# Patient Record
Sex: Female | Born: 1955 | Race: White | Hispanic: No | Smoking: Current every day smoker
Health system: Southern US, Community
[De-identification: ages and names within clinical notes are randomized; demographics above are authoritative.]

## PROBLEM LIST (undated history)

## (undated) ENCOUNTER — Emergency Department (HOSPITAL_COMMUNITY): Discharge status: Absent without leave | Payer: Self-pay

## (undated) DIAGNOSIS — C539 Malignant neoplasm of cervix uteri, unspecified: Secondary | ICD-10-CM

## (undated) DIAGNOSIS — I480 Paroxysmal atrial fibrillation: Secondary | ICD-10-CM

## (undated) DIAGNOSIS — Z9981 Dependence on supplemental oxygen: Secondary | ICD-10-CM

## (undated) DIAGNOSIS — C44320 Squamous cell carcinoma of skin of unspecified parts of face: Secondary | ICD-10-CM

## (undated) DIAGNOSIS — F419 Anxiety disorder, unspecified: Secondary | ICD-10-CM

## (undated) DIAGNOSIS — E119 Type 2 diabetes mellitus without complications: Secondary | ICD-10-CM

## (undated) DIAGNOSIS — F32A Depression, unspecified: Secondary | ICD-10-CM

## (undated) DIAGNOSIS — E876 Hypokalemia: Secondary | ICD-10-CM

## (undated) DIAGNOSIS — I10 Essential (primary) hypertension: Secondary | ICD-10-CM

## (undated) DIAGNOSIS — J449 Chronic obstructive pulmonary disease, unspecified: Secondary | ICD-10-CM

## (undated) DIAGNOSIS — J42 Unspecified chronic bronchitis: Secondary | ICD-10-CM

## (undated) DIAGNOSIS — N824 Other female intestinal-genital tract fistulae: Secondary | ICD-10-CM

## (undated) DIAGNOSIS — F909 Attention-deficit hyperactivity disorder, unspecified type: Secondary | ICD-10-CM

## (undated) DIAGNOSIS — J45909 Unspecified asthma, uncomplicated: Secondary | ICD-10-CM

## (undated) DIAGNOSIS — M1990 Unspecified osteoarthritis, unspecified site: Secondary | ICD-10-CM

## (undated) DIAGNOSIS — C44310 Basal cell carcinoma of skin of unspecified parts of face: Secondary | ICD-10-CM

## (undated) DIAGNOSIS — K219 Gastro-esophageal reflux disease without esophagitis: Secondary | ICD-10-CM

## (undated) DIAGNOSIS — F329 Major depressive disorder, single episode, unspecified: Secondary | ICD-10-CM

## (undated) DIAGNOSIS — E041 Nontoxic single thyroid nodule: Secondary | ICD-10-CM

## (undated) HISTORY — DX: Attention-deficit hyperactivity disorder, unspecified type: F90.9

## (undated) HISTORY — PX: COLECTOMY: SHX59

## (undated) HISTORY — PX: SQUAMOUS CELL CARCINOMA EXCISION: SHX2433

## (undated) HISTORY — DX: Other female intestinal-genital tract fistulae: N82.4

## (undated) HISTORY — PX: EXCISIONAL HEMORRHOIDECTOMY: SHX1541

## (undated) HISTORY — DX: Hypokalemia: E87.6

## (undated) HISTORY — DX: Depression, unspecified: F32.A

## (undated) HISTORY — PX: TUBAL LIGATION: SHX77

## (undated) HISTORY — PX: LAPAROSCOPIC CHOLECYSTECTOMY: SUR755

## (undated) HISTORY — PX: BACK SURGERY: SHX140

## (undated) HISTORY — DX: Major depressive disorder, single episode, unspecified: F32.9

## (undated) HISTORY — DX: Unspecified osteoarthritis, unspecified site: M19.90

## (undated) HISTORY — PX: ABDOMINAL HYSTERECTOMY: SHX81

## (undated) HISTORY — PX: HERNIA REPAIR: SHX51

## (undated) HISTORY — PX: BASAL CELL CARCINOMA EXCISION: SHX1214

## (undated) HISTORY — PX: WRIST SURGERY: SHX841

## (undated) HISTORY — PX: ANTERIOR CERVICAL DECOMP/DISCECTOMY FUSION: SHX1161

---

## 1998-01-17 ENCOUNTER — Emergency Department (HOSPITAL_COMMUNITY): Admission: EM | Admit: 1998-01-17 | Discharge: 1998-01-17 | Payer: Self-pay | Admitting: Emergency Medicine

## 1998-01-17 ENCOUNTER — Encounter: Payer: Self-pay | Admitting: Emergency Medicine

## 1998-03-18 ENCOUNTER — Emergency Department (HOSPITAL_COMMUNITY): Admission: EM | Admit: 1998-03-18 | Discharge: 1998-03-18 | Payer: Self-pay | Admitting: Emergency Medicine

## 1998-03-18 ENCOUNTER — Encounter: Payer: Self-pay | Admitting: Emergency Medicine

## 1998-03-22 ENCOUNTER — Encounter: Payer: Self-pay | Admitting: Emergency Medicine

## 1998-03-22 ENCOUNTER — Emergency Department (HOSPITAL_COMMUNITY): Admission: EM | Admit: 1998-03-22 | Discharge: 1998-03-22 | Payer: Self-pay | Admitting: Emergency Medicine

## 1998-03-24 ENCOUNTER — Inpatient Hospital Stay (HOSPITAL_COMMUNITY): Admission: EM | Admit: 1998-03-24 | Discharge: 1998-03-29 | Payer: Self-pay | Admitting: Psychiatry

## 1998-04-13 ENCOUNTER — Other Ambulatory Visit: Admission: RE | Admit: 1998-04-13 | Discharge: 1998-04-13 | Payer: Self-pay | Admitting: Obstetrics and Gynecology

## 1998-04-19 ENCOUNTER — Emergency Department (HOSPITAL_COMMUNITY): Admission: EM | Admit: 1998-04-19 | Discharge: 1998-04-19 | Payer: Self-pay | Admitting: Emergency Medicine

## 1998-04-27 ENCOUNTER — Encounter: Admission: RE | Admit: 1998-04-27 | Discharge: 1998-04-27 | Payer: Self-pay | Admitting: Obstetrics & Gynecology

## 1998-05-05 ENCOUNTER — Emergency Department (HOSPITAL_COMMUNITY): Admission: EM | Admit: 1998-05-05 | Discharge: 1998-05-05 | Payer: Self-pay | Admitting: Emergency Medicine

## 1998-05-05 ENCOUNTER — Encounter: Payer: Self-pay | Admitting: *Deleted

## 1998-05-12 ENCOUNTER — Ambulatory Visit (HOSPITAL_COMMUNITY): Admission: RE | Admit: 1998-05-12 | Discharge: 1998-05-12 | Payer: Self-pay | Admitting: Family Medicine

## 1998-05-12 ENCOUNTER — Encounter: Payer: Self-pay | Admitting: Family Medicine

## 1998-05-14 ENCOUNTER — Encounter: Admission: RE | Admit: 1998-05-14 | Discharge: 1998-05-14 | Payer: Self-pay | Admitting: Obstetrics & Gynecology

## 1998-05-14 ENCOUNTER — Other Ambulatory Visit: Admission: RE | Admit: 1998-05-14 | Discharge: 1998-05-14 | Payer: Self-pay

## 1998-05-27 ENCOUNTER — Encounter: Admission: RE | Admit: 1998-05-27 | Discharge: 1998-05-27 | Payer: Self-pay | Admitting: Obstetrics

## 1998-06-10 ENCOUNTER — Inpatient Hospital Stay (HOSPITAL_COMMUNITY): Admission: RE | Admit: 1998-06-10 | Discharge: 1998-06-14 | Payer: Self-pay | Admitting: *Deleted

## 1998-06-17 ENCOUNTER — Encounter: Admission: RE | Admit: 1998-06-17 | Discharge: 1998-06-17 | Payer: Self-pay | Admitting: Obstetrics & Gynecology

## 1998-06-21 ENCOUNTER — Inpatient Hospital Stay (HOSPITAL_COMMUNITY): Admission: AD | Admit: 1998-06-21 | Discharge: 1998-06-21 | Payer: Self-pay | Admitting: Obstetrics & Gynecology

## 1998-06-25 ENCOUNTER — Encounter: Admission: RE | Admit: 1998-06-25 | Discharge: 1998-06-25 | Payer: Self-pay | Admitting: Obstetrics & Gynecology

## 1998-06-29 ENCOUNTER — Encounter: Admission: RE | Admit: 1998-06-29 | Discharge: 1998-06-29 | Payer: Self-pay | Admitting: Obstetrics & Gynecology

## 1998-07-06 ENCOUNTER — Encounter: Admission: RE | Admit: 1998-07-06 | Discharge: 1998-07-06 | Payer: Self-pay | Admitting: Obstetrics & Gynecology

## 1998-09-03 ENCOUNTER — Encounter: Admission: RE | Admit: 1998-09-03 | Discharge: 1998-09-03 | Payer: Self-pay | Admitting: Obstetrics & Gynecology

## 1998-10-04 ENCOUNTER — Encounter: Payer: Self-pay | Admitting: Family Medicine

## 1998-10-04 ENCOUNTER — Ambulatory Visit (HOSPITAL_COMMUNITY): Admission: RE | Admit: 1998-10-04 | Discharge: 1998-10-04 | Payer: Self-pay | Admitting: Family Medicine

## 1998-10-13 ENCOUNTER — Ambulatory Visit (HOSPITAL_COMMUNITY): Admission: RE | Admit: 1998-10-13 | Discharge: 1998-10-13 | Payer: Self-pay | Admitting: Family Medicine

## 1998-10-21 ENCOUNTER — Emergency Department (HOSPITAL_COMMUNITY): Admission: EM | Admit: 1998-10-21 | Discharge: 1998-10-21 | Payer: Self-pay | Admitting: *Deleted

## 1998-11-11 ENCOUNTER — Encounter: Payer: Self-pay | Admitting: Emergency Medicine

## 1998-11-11 ENCOUNTER — Emergency Department (HOSPITAL_COMMUNITY): Admission: EM | Admit: 1998-11-11 | Discharge: 1998-11-11 | Payer: Self-pay | Admitting: Emergency Medicine

## 1998-12-19 ENCOUNTER — Emergency Department (HOSPITAL_COMMUNITY): Admission: EM | Admit: 1998-12-19 | Discharge: 1998-12-19 | Payer: Self-pay | Admitting: Emergency Medicine

## 1999-02-23 ENCOUNTER — Ambulatory Visit (HOSPITAL_COMMUNITY): Admission: RE | Admit: 1999-02-23 | Discharge: 1999-02-23 | Payer: Self-pay | Admitting: Family Medicine

## 1999-02-23 ENCOUNTER — Encounter: Payer: Self-pay | Admitting: Family Medicine

## 1999-03-28 ENCOUNTER — Encounter (HOSPITAL_BASED_OUTPATIENT_CLINIC_OR_DEPARTMENT_OTHER): Payer: Self-pay | Admitting: General Surgery

## 1999-03-28 ENCOUNTER — Ambulatory Visit (HOSPITAL_COMMUNITY): Admission: RE | Admit: 1999-03-28 | Discharge: 1999-03-28 | Payer: Self-pay | Admitting: General Surgery

## 1999-03-29 ENCOUNTER — Encounter (HOSPITAL_BASED_OUTPATIENT_CLINIC_OR_DEPARTMENT_OTHER): Payer: Self-pay | Admitting: General Surgery

## 1999-03-29 ENCOUNTER — Emergency Department (HOSPITAL_COMMUNITY): Admission: EM | Admit: 1999-03-29 | Discharge: 1999-03-29 | Payer: Self-pay | Admitting: Emergency Medicine

## 1999-04-28 ENCOUNTER — Inpatient Hospital Stay (HOSPITAL_COMMUNITY): Admission: RE | Admit: 1999-04-28 | Discharge: 1999-04-29 | Payer: Self-pay | Admitting: General Surgery

## 1999-11-07 ENCOUNTER — Emergency Department (HOSPITAL_COMMUNITY): Admission: EM | Admit: 1999-11-07 | Discharge: 1999-11-07 | Payer: Self-pay | Admitting: Emergency Medicine

## 2000-01-11 ENCOUNTER — Encounter (HOSPITAL_BASED_OUTPATIENT_CLINIC_OR_DEPARTMENT_OTHER): Payer: Self-pay | Admitting: General Surgery

## 2000-01-12 ENCOUNTER — Ambulatory Visit (HOSPITAL_COMMUNITY): Admission: RE | Admit: 2000-01-12 | Discharge: 2000-01-12 | Payer: Self-pay | Admitting: General Surgery

## 2000-01-12 ENCOUNTER — Encounter (INDEPENDENT_AMBULATORY_CARE_PROVIDER_SITE_OTHER): Payer: Self-pay | Admitting: *Deleted

## 2000-05-16 ENCOUNTER — Other Ambulatory Visit: Admission: RE | Admit: 2000-05-16 | Discharge: 2000-05-16 | Payer: Self-pay | Admitting: Family Medicine

## 2000-05-24 ENCOUNTER — Ambulatory Visit (HOSPITAL_COMMUNITY): Admission: RE | Admit: 2000-05-24 | Discharge: 2000-05-24 | Payer: Self-pay | Admitting: Family Medicine

## 2000-07-13 ENCOUNTER — Other Ambulatory Visit: Admission: RE | Admit: 2000-07-13 | Discharge: 2000-07-13 | Payer: Self-pay | Admitting: Obstetrics and Gynecology

## 2000-07-13 ENCOUNTER — Encounter (INDEPENDENT_AMBULATORY_CARE_PROVIDER_SITE_OTHER): Payer: Self-pay

## 2000-08-15 ENCOUNTER — Emergency Department (HOSPITAL_COMMUNITY): Admission: EM | Admit: 2000-08-15 | Discharge: 2000-08-15 | Payer: Self-pay | Admitting: Emergency Medicine

## 2000-08-15 ENCOUNTER — Encounter: Payer: Self-pay | Admitting: Internal Medicine

## 2000-10-09 ENCOUNTER — Ambulatory Visit (HOSPITAL_COMMUNITY): Admission: RE | Admit: 2000-10-09 | Discharge: 2000-10-09 | Payer: Self-pay | Admitting: Obstetrics and Gynecology

## 2000-10-09 ENCOUNTER — Encounter (INDEPENDENT_AMBULATORY_CARE_PROVIDER_SITE_OTHER): Payer: Self-pay

## 2000-10-30 ENCOUNTER — Ambulatory Visit (HOSPITAL_COMMUNITY): Admission: RE | Admit: 2000-10-30 | Discharge: 2000-10-30 | Payer: Self-pay | Admitting: Family Medicine

## 2000-10-31 ENCOUNTER — Ambulatory Visit (HOSPITAL_COMMUNITY): Admission: RE | Admit: 2000-10-31 | Discharge: 2000-10-31 | Payer: Self-pay | Admitting: Family Medicine

## 2000-10-31 ENCOUNTER — Encounter: Payer: Self-pay | Admitting: Family Medicine

## 2000-11-12 ENCOUNTER — Emergency Department (HOSPITAL_COMMUNITY): Admission: EM | Admit: 2000-11-12 | Discharge: 2000-11-13 | Payer: Self-pay | Admitting: Emergency Medicine

## 2000-11-15 ENCOUNTER — Emergency Department (HOSPITAL_COMMUNITY): Admission: EM | Admit: 2000-11-15 | Discharge: 2000-11-15 | Payer: Self-pay | Admitting: Emergency Medicine

## 2000-11-15 ENCOUNTER — Encounter: Payer: Self-pay | Admitting: Emergency Medicine

## 2000-12-12 ENCOUNTER — Emergency Department (HOSPITAL_COMMUNITY): Admission: EM | Admit: 2000-12-12 | Discharge: 2000-12-12 | Payer: Self-pay | Admitting: Emergency Medicine

## 2000-12-15 ENCOUNTER — Encounter: Payer: Self-pay | Admitting: Emergency Medicine

## 2000-12-15 ENCOUNTER — Emergency Department (HOSPITAL_COMMUNITY): Admission: EM | Admit: 2000-12-15 | Discharge: 2000-12-15 | Payer: Self-pay | Admitting: Emergency Medicine

## 2000-12-18 ENCOUNTER — Emergency Department (HOSPITAL_COMMUNITY): Admission: EM | Admit: 2000-12-18 | Discharge: 2000-12-18 | Payer: Self-pay

## 2001-01-01 ENCOUNTER — Encounter: Admission: RE | Admit: 2001-01-01 | Discharge: 2001-03-01 | Payer: Self-pay | Admitting: Orthopedic Surgery

## 2001-01-14 ENCOUNTER — Other Ambulatory Visit: Admission: RE | Admit: 2001-01-14 | Discharge: 2001-01-14 | Payer: Self-pay | Admitting: Obstetrics and Gynecology

## 2001-02-15 ENCOUNTER — Ambulatory Visit (HOSPITAL_COMMUNITY): Admission: RE | Admit: 2001-02-15 | Discharge: 2001-02-15 | Payer: Self-pay | Admitting: General Surgery

## 2001-02-15 ENCOUNTER — Encounter (INDEPENDENT_AMBULATORY_CARE_PROVIDER_SITE_OTHER): Payer: Self-pay | Admitting: *Deleted

## 2001-03-25 ENCOUNTER — Ambulatory Visit (HOSPITAL_COMMUNITY): Admission: RE | Admit: 2001-03-25 | Discharge: 2001-03-25 | Payer: Self-pay | Admitting: General Surgery

## 2001-03-25 ENCOUNTER — Encounter (HOSPITAL_BASED_OUTPATIENT_CLINIC_OR_DEPARTMENT_OTHER): Payer: Self-pay | Admitting: General Surgery

## 2001-04-03 ENCOUNTER — Ambulatory Visit (HOSPITAL_COMMUNITY): Admission: RE | Admit: 2001-04-03 | Discharge: 2001-04-04 | Payer: Self-pay | Admitting: General Surgery

## 2001-04-03 ENCOUNTER — Encounter (INDEPENDENT_AMBULATORY_CARE_PROVIDER_SITE_OTHER): Payer: Self-pay | Admitting: *Deleted

## 2002-03-24 ENCOUNTER — Other Ambulatory Visit: Admission: RE | Admit: 2002-03-24 | Discharge: 2002-03-24 | Payer: Self-pay | Admitting: Obstetrics and Gynecology

## 2002-11-17 ENCOUNTER — Other Ambulatory Visit: Admission: RE | Admit: 2002-11-17 | Discharge: 2002-11-17 | Payer: Self-pay | Admitting: Family Medicine

## 2003-09-09 ENCOUNTER — Ambulatory Visit: Admission: RE | Admit: 2003-09-09 | Discharge: 2003-09-09 | Payer: Self-pay | Admitting: Gynecology

## 2003-10-20 ENCOUNTER — Ambulatory Visit: Payer: Self-pay | Admitting: Family Medicine

## 2003-10-26 ENCOUNTER — Encounter: Admission: RE | Admit: 2003-10-26 | Discharge: 2003-10-26 | Payer: Self-pay | Admitting: Gastroenterology

## 2003-11-04 ENCOUNTER — Encounter: Admission: RE | Admit: 2003-11-04 | Discharge: 2003-11-04 | Payer: Self-pay | Admitting: Gastroenterology

## 2003-11-25 ENCOUNTER — Ambulatory Visit (HOSPITAL_COMMUNITY): Admission: RE | Admit: 2003-11-25 | Discharge: 2003-11-25 | Payer: Self-pay | Admitting: Cardiology

## 2003-12-25 ENCOUNTER — Ambulatory Visit: Payer: Self-pay | Admitting: Family Medicine

## 2003-12-29 ENCOUNTER — Ambulatory Visit (HOSPITAL_COMMUNITY): Admission: RE | Admit: 2003-12-29 | Discharge: 2003-12-29 | Payer: Self-pay | Admitting: Family Medicine

## 2004-01-06 ENCOUNTER — Ambulatory Visit: Payer: Self-pay | Admitting: Family Medicine

## 2004-01-12 ENCOUNTER — Ambulatory Visit: Payer: Self-pay | Admitting: Family Medicine

## 2004-02-11 ENCOUNTER — Ambulatory Visit: Payer: Self-pay | Admitting: Family Medicine

## 2004-02-14 HISTORY — PX: OOPHORECTOMY: SHX86

## 2004-02-14 HISTORY — PX: VULVA SURGERY: SHX837

## 2004-02-23 ENCOUNTER — Other Ambulatory Visit: Admission: RE | Admit: 2004-02-23 | Discharge: 2004-02-23 | Payer: Self-pay | Admitting: Obstetrics and Gynecology

## 2004-03-11 ENCOUNTER — Ambulatory Visit: Payer: Self-pay | Admitting: Family Medicine

## 2004-04-05 ENCOUNTER — Ambulatory Visit: Payer: Self-pay | Admitting: Family Medicine

## 2004-04-15 ENCOUNTER — Ambulatory Visit: Payer: Self-pay | Admitting: Family Medicine

## 2004-05-06 ENCOUNTER — Ambulatory Visit: Payer: Self-pay | Admitting: Family Medicine

## 2004-05-26 ENCOUNTER — Ambulatory Visit (HOSPITAL_COMMUNITY): Admission: RE | Admit: 2004-05-26 | Discharge: 2004-05-26 | Payer: Self-pay

## 2004-06-09 ENCOUNTER — Ambulatory Visit: Payer: Self-pay | Admitting: Family Medicine

## 2004-06-21 ENCOUNTER — Ambulatory Visit: Payer: Self-pay | Admitting: Family Medicine

## 2004-07-04 ENCOUNTER — Ambulatory Visit: Payer: Self-pay | Admitting: Family Medicine

## 2004-09-27 ENCOUNTER — Inpatient Hospital Stay (HOSPITAL_COMMUNITY): Admission: RE | Admit: 2004-09-27 | Discharge: 2004-09-28 | Payer: Self-pay | Admitting: Neurosurgery

## 2005-04-18 ENCOUNTER — Emergency Department (HOSPITAL_COMMUNITY): Admission: EM | Admit: 2005-04-18 | Discharge: 2005-04-18 | Payer: Self-pay | Admitting: Family Medicine

## 2005-06-04 ENCOUNTER — Emergency Department (HOSPITAL_COMMUNITY): Admission: EM | Admit: 2005-06-04 | Discharge: 2005-06-04 | Payer: Self-pay | Admitting: Family Medicine

## 2005-06-08 ENCOUNTER — Ambulatory Visit: Admission: RE | Admit: 2005-06-08 | Discharge: 2005-06-08 | Payer: Self-pay | Admitting: Orthopedic Surgery

## 2005-06-28 ENCOUNTER — Ambulatory Visit (HOSPITAL_COMMUNITY): Admission: RE | Admit: 2005-06-28 | Discharge: 2005-06-28 | Payer: Self-pay | Admitting: Orthopedic Surgery

## 2005-07-04 ENCOUNTER — Encounter: Admission: RE | Admit: 2005-07-04 | Discharge: 2005-08-31 | Payer: Self-pay | Admitting: Orthopedic Surgery

## 2005-09-14 ENCOUNTER — Ambulatory Visit (HOSPITAL_COMMUNITY): Admission: RE | Admit: 2005-09-14 | Discharge: 2005-09-14 | Payer: Self-pay | Admitting: Neurosurgery

## 2005-10-04 ENCOUNTER — Ambulatory Visit (HOSPITAL_COMMUNITY): Admission: RE | Admit: 2005-10-04 | Discharge: 2005-10-04 | Payer: Self-pay | Admitting: Orthopedic Surgery

## 2005-10-12 ENCOUNTER — Encounter: Admission: RE | Admit: 2005-10-12 | Discharge: 2006-01-10 | Payer: Self-pay | Admitting: Orthopedic Surgery

## 2005-10-28 ENCOUNTER — Emergency Department (HOSPITAL_COMMUNITY): Admission: AD | Admit: 2005-10-28 | Discharge: 2005-10-28 | Payer: Self-pay | Admitting: Emergency Medicine

## 2006-02-23 ENCOUNTER — Emergency Department (HOSPITAL_COMMUNITY): Admission: EM | Admit: 2006-02-23 | Discharge: 2006-02-23 | Payer: Self-pay | Admitting: Family Medicine

## 2006-03-15 ENCOUNTER — Ambulatory Visit (HOSPITAL_COMMUNITY): Admission: RE | Admit: 2006-03-15 | Discharge: 2006-03-15 | Payer: Self-pay | Admitting: Neurosurgery

## 2006-04-04 ENCOUNTER — Ambulatory Visit (HOSPITAL_COMMUNITY): Admission: RE | Admit: 2006-04-04 | Discharge: 2006-04-04 | Payer: Self-pay | Admitting: Obstetrics & Gynecology

## 2006-05-16 ENCOUNTER — Encounter: Admission: RE | Admit: 2006-05-16 | Discharge: 2006-08-14 | Payer: Self-pay | Admitting: Obstetrics & Gynecology

## 2006-06-04 ENCOUNTER — Encounter: Admission: RE | Admit: 2006-06-04 | Discharge: 2006-06-04 | Payer: Self-pay | Admitting: Rheumatology

## 2006-10-31 ENCOUNTER — Encounter: Admission: RE | Admit: 2006-10-31 | Discharge: 2006-10-31 | Payer: Self-pay | Admitting: Neurosurgery

## 2007-06-28 ENCOUNTER — Emergency Department (HOSPITAL_COMMUNITY): Admission: EM | Admit: 2007-06-28 | Discharge: 2007-06-28 | Payer: Self-pay | Admitting: Emergency Medicine

## 2007-12-02 ENCOUNTER — Encounter: Admission: RE | Admit: 2007-12-02 | Discharge: 2007-12-02 | Payer: Self-pay | Admitting: Neurosurgery

## 2008-04-02 ENCOUNTER — Emergency Department (HOSPITAL_COMMUNITY): Admission: EM | Admit: 2008-04-02 | Discharge: 2008-04-02 | Payer: Self-pay | Admitting: Family Medicine

## 2008-04-07 ENCOUNTER — Emergency Department (HOSPITAL_COMMUNITY): Admission: EM | Admit: 2008-04-07 | Discharge: 2008-04-07 | Payer: Self-pay | Admitting: Emergency Medicine

## 2008-05-01 ENCOUNTER — Encounter: Admission: RE | Admit: 2008-05-01 | Discharge: 2008-05-05 | Payer: Self-pay | Admitting: Anesthesiology

## 2008-05-05 ENCOUNTER — Ambulatory Visit: Payer: Self-pay | Admitting: Anesthesiology

## 2008-05-17 ENCOUNTER — Emergency Department (HOSPITAL_COMMUNITY): Admission: EM | Admit: 2008-05-17 | Discharge: 2008-05-17 | Payer: Self-pay | Admitting: Emergency Medicine

## 2008-06-08 ENCOUNTER — Emergency Department (HOSPITAL_COMMUNITY): Admission: EM | Admit: 2008-06-08 | Discharge: 2008-06-08 | Payer: Self-pay | Admitting: Emergency Medicine

## 2009-02-03 ENCOUNTER — Encounter: Admission: RE | Admit: 2009-02-03 | Discharge: 2009-02-03 | Payer: Self-pay | Admitting: Internal Medicine

## 2009-02-08 ENCOUNTER — Encounter: Admission: RE | Admit: 2009-02-08 | Discharge: 2009-02-08 | Payer: Self-pay | Admitting: Internal Medicine

## 2010-01-10 ENCOUNTER — Inpatient Hospital Stay (HOSPITAL_COMMUNITY)
Admission: AD | Admit: 2010-01-10 | Discharge: 2010-01-10 | Payer: Self-pay | Source: Home / Self Care | Admitting: Obstetrics

## 2010-01-13 ENCOUNTER — Emergency Department (HOSPITAL_COMMUNITY)
Admission: EM | Admit: 2010-01-13 | Discharge: 2010-01-13 | Payer: Self-pay | Source: Home / Self Care | Admitting: Emergency Medicine

## 2010-02-13 DIAGNOSIS — N824 Other female intestinal-genital tract fistulae: Secondary | ICD-10-CM

## 2010-02-13 HISTORY — PX: RECTOVAGINAL FISTULA CLOSURE: SUR265

## 2010-02-13 HISTORY — DX: Other female intestinal-genital tract fistulae: N82.4

## 2010-02-16 ENCOUNTER — Encounter (INDEPENDENT_AMBULATORY_CARE_PROVIDER_SITE_OTHER): Payer: Self-pay | Admitting: General Surgery

## 2010-02-16 ENCOUNTER — Inpatient Hospital Stay (HOSPITAL_COMMUNITY)
Admission: RE | Admit: 2010-02-16 | Discharge: 2010-02-24 | Payer: Self-pay | Source: Home / Self Care | Attending: General Surgery | Admitting: General Surgery

## 2010-02-16 LAB — URINALYSIS, ROUTINE W REFLEX MICROSCOPIC
Hemoglobin, Urine: NEGATIVE
Ketones, ur: 15 mg/dL — AB
Nitrite: NEGATIVE
Protein, ur: 30 mg/dL — AB
Specific Gravity, Urine: 1.023 (ref 1.005–1.030)
Urine Glucose, Fasting: NEGATIVE mg/dL
Urobilinogen, UA: 0.2 mg/dL (ref 0.0–1.0)
pH: 8 (ref 5.0–8.0)

## 2010-02-16 LAB — TYPE AND SCREEN
ABO/RH(D): A POS
Antibody Screen: NEGATIVE

## 2010-02-16 LAB — URINE MICROSCOPIC-ADD ON

## 2010-02-17 LAB — BASIC METABOLIC PANEL
BUN: 6 mg/dL (ref 6–23)
CO2: 29 mEq/L (ref 19–32)
Calcium: 7.8 mg/dL — ABNORMAL LOW (ref 8.4–10.5)
Chloride: 102 mEq/L (ref 96–112)
Creatinine, Ser: 0.73 mg/dL (ref 0.4–1.2)
GFR calc Af Amer: >60 mL/min (ref >60)
GFR calc non Af Amer: >60 mL/min (ref >60)
Glucose, Bld: 232 mg/dL — ABNORMAL HIGH (ref 70–99)
Potassium: 4.2 mEq/L (ref 3.5–5.1)
Sodium: 138 mEq/L (ref 135–145)

## 2010-02-17 LAB — CBC
HCT: 41 % (ref 36.0–46.0)
Hemoglobin: 12.8 g/dL (ref 12.0–15.0)
MCH: 30.3 pg (ref 26.0–34.0)
MCHC: 31.2 g/dL (ref 30.0–36.0)
MCV: 97.2 fL (ref 78.0–100.0)
Platelets: 252 10*3/uL (ref 150–400)
RBC: 4.22 MIL/uL (ref 3.87–5.11)
RDW: 14.2 % (ref 11.5–15.5)
WBC: 16.9 10*3/uL — ABNORMAL HIGH (ref 4.0–10.5)

## 2010-02-17 LAB — GLUCOSE, CAPILLARY
Glucose-Capillary: 152 mg/dL — ABNORMAL HIGH (ref 70–99)
Glucose-Capillary: 146 mg/dL — ABNORMAL HIGH (ref 70–99)

## 2010-02-17 LAB — ABO/RH: ABO/RH(D): A POS

## 2010-02-18 LAB — GLUCOSE, CAPILLARY
Glucose-Capillary: 190 mg/dL — ABNORMAL HIGH (ref 70–99)
Glucose-Capillary: 171 mg/dL — ABNORMAL HIGH (ref 70–99)

## 2010-02-18 LAB — CBC
HCT: 38.2 % (ref 36.0–46.0)
Hemoglobin: 11.5 g/dL — ABNORMAL LOW (ref 12.0–15.0)
MCH: 29.9 pg (ref 26.0–34.0)
MCHC: 30.1 g/dL (ref 30.0–36.0)
MCV: 99.2 fL (ref 78.0–100.0)
Platelets: 225 10*3/uL (ref 150–400)
RBC: 3.85 MIL/uL — ABNORMAL LOW (ref 3.87–5.11)
RDW: 14.6 % (ref 11.5–15.5)
WBC: 11 10*3/uL — ABNORMAL HIGH (ref 4.0–10.5)

## 2010-02-18 LAB — BASIC METABOLIC PANEL
BUN: 4 mg/dL — ABNORMAL LOW (ref 6–23)
CO2: 29 mEq/L (ref 19–32)
Calcium: 7.6 mg/dL — ABNORMAL LOW (ref 8.4–10.5)
Chloride: 106 mEq/L (ref 96–112)
Creatinine, Ser: 0.71 mg/dL (ref 0.4–1.2)
GFR calc Af Amer: >60 mL/min (ref >60)
GFR calc non Af Amer: >60 mL/min (ref >60)
Glucose, Bld: 154 mg/dL — ABNORMAL HIGH (ref 70–99)
Potassium: 4.1 mEq/L (ref 3.5–5.1)
Sodium: 141 mEq/L (ref 135–145)

## 2010-02-28 LAB — GLUCOSE, CAPILLARY
Glucose-Capillary: 155 mg/dL — ABNORMAL HIGH (ref 70–99)
Glucose-Capillary: 153 mg/dL — ABNORMAL HIGH (ref 70–99)
Glucose-Capillary: 136 mg/dL — ABNORMAL HIGH (ref 70–99)
Glucose-Capillary: 183 mg/dL — ABNORMAL HIGH (ref 70–99)
Glucose-Capillary: 111 mg/dL — ABNORMAL HIGH (ref 70–99)
Glucose-Capillary: 168 mg/dL — ABNORMAL HIGH (ref 70–99)
Glucose-Capillary: 157 mg/dL — ABNORMAL HIGH (ref 70–99)
Glucose-Capillary: 149 mg/dL — ABNORMAL HIGH (ref 70–99)
Glucose-Capillary: 137 mg/dL — ABNORMAL HIGH (ref 70–99)
Glucose-Capillary: 120 mg/dL — ABNORMAL HIGH (ref 70–99)
Glucose-Capillary: 128 mg/dL — ABNORMAL HIGH (ref 70–99)

## 2010-02-28 LAB — WOUND CULTURE

## 2010-03-06 ENCOUNTER — Encounter: Payer: Self-pay | Admitting: Internal Medicine

## 2010-03-06 ENCOUNTER — Encounter: Payer: Self-pay | Admitting: Obstetrics & Gynecology

## 2010-03-06 ENCOUNTER — Encounter: Payer: Self-pay | Admitting: Neurosurgery

## 2010-03-08 NOTE — Discharge Summary (Signed)
[name], [name]              ACCOUNT NO.:  [id number]  MEDICAL RECORD NO.:  [id number]          PATIENT TYPE:  INP  LOCATION:  1532                         FACILITY:  [facility]  PHYSICIAN:  Sharlet Salina T. Kamran Coker, M.D.DATE OF BIRTH:  [birth year]  DATE OF ADMISSION:  02/16/2010 DATE OF DISCHARGE:  02/24/2010                              DISCHARGE SUMMARY   DISCHARGE DIAGNOSES: 1. Diverticulitis with colovaginal fistula. 2. Postoperative wound infection.  OPERATIONS AND PROCEDURES:  Sigmoid colectomy with anastomosis and repair of colovaginal fistula by Dr. Johna Sheriff on February 16, 2010.  HISTORY OF PRESENT ILLNESS:  The patient is a 55 year old female who presented to [facility] ER and then [facility] Emergency Room at the end of November complaining of 3 weeks of lower abdominal pain, right greater than left, associated with some fever.  She then developed vaginal discharge, initially purulent and then feculent.  CT scan obtained in the emergency room revealed clear evidence of a colovaginal fistula with contrast and air in the vagina and an apparent fistulous connection to the mid sigmoid colon where there was marked diverticulosis and some thickening consistent with diverticulitis, but no marked inflammatory change or abscess.  The patient has been treated as an outpatient with Cipro and Flagyl and was referred electively to my office.  With these findings we have recommended elective sigmoid colectomy and takedown of her colovaginal fistula and she was admitted for this procedure.  The patient has no previous history of known diverticulitis.  She had a colonoscopy in 2005 showing diverticulosis.  PAST MEDICAL HISTORY:  She is followed by Dr. Mikeal Hawthorne for bronchitis, history of anxiety, depression, arthritis and obesity.  PAST SURGICAL HISTORY:  Previous surgery includes upper abdominal ventral incisional hernia repair with mesh and also a suprapubic hernia repair with  mesh as well as a right inguinal hernia repair.  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  She has had vulvar excision for cancer in situ in 2006.  Also neck fusion and other orthopedic surgeries for chronic back pain.  MEDICATIONS: 1. Advair Diskus twice daily. 2. Ritalin daily. 3. Cipro as above. 4. Flagyl as above. 5. Clomipramine 25 mg at bedtime. 6. Carisoprodol 350 mg at bedtime. 7. Xanax p.r.n. 8. Hydrocodone p.r.n.  ALLERGIES:  PENICILLIN, ERYTHROMYCIN and DOXYCYCLINE.  SOCIAL HISTORY:  See detailed H and P.  FAMILY HISTORY:  See detailed H and P.  REVIEW OF SYSTEMS:  See detailed H and P.  PHYSICAL EXAMINATION:  VITAL SIGNS:  She is 5 feet 6 inches, 212 pounds. Blood pressure 140/90, afebrile. ABDOMEN:  Obese, well-healed incisions.  No palpable hernias.  Some mild tenderness across the lower abdomen without guarding or masses. RECTAL:  Exam unremarkable.  HOSPITAL COURSE:  The patient underwent a mechanical antibiotic bowel prep at home without difficulty and was admitted on the morning of her procedure.  She underwent sigmoid colectomy with findings of a sigmoid to vaginal fistula as expected.  She tolerated the procedure well.  She had some moderate rhonchi and cough postoperatively treated with bronchodilators and pulmonary toilet.  Hyperglycemia was controlled with sliding  scale insulin.  Her white count was 17,000 on the first postoperative day.  The patient has gotten out of bed and mobilized. White count was decreased to 11,000 on the second postoperative day. She was transferred to the floor at this time after being observed in step-down and a clear liquid diet was started.  About the fourth postoperative day some erythema in the lower wound was noted which progressed.  I opened the lower part of her wound for a purulent wound infection which was down to the intact fascia.  This was treated with moist saline  dressing changes and the patient had been started on IV Invanz.  At this point she was on a regular diet, tolerating this well and having bowel movements.  She had some low O2 saturations off of oxygen that gradually improved with pulmonary toilette and mobilization. The patient continued to improve.  Her wound erythema resolved and her antibiotics were stopped.  She was discharged home on February 24, 2010. She was afebrile.  Vital signs were within normal limits.  About a third of the lower abdominal wound was packed open and is clean.  Discharge medications are the same as admission.  Home Health was contacted for daily moist saline gauze dressing changes.  Wound culture at this time growing few Staphylococcus aureus.  Pathology revealed diverticulosis.  FOLLOWUP:  Followup for this will be in my office in one week.     Lorne Skeens. [name], M.D.     Tory Emerald  D:  03/04/2010  T:  03/04/2010  Job:  [id number]  Electronically Signed by Glenna Fellows M.D. on 03/08/2010 09:22:41 AM

## 2010-04-25 LAB — CBC
Hemoglobin: 13.8 g/dL (ref 12.0–15.0)
MCV: 97.8 fL (ref 78.0–100.0)
Platelets: 250 10*3/uL (ref 150–400)
RBC: 4.51 MIL/uL (ref 3.87–5.11)
WBC: 10.1 10*3/uL (ref 4.0–10.5)

## 2010-04-25 LAB — COMPREHENSIVE METABOLIC PANEL
AST: 21 U/L (ref 0–37)
Albumin: 3.5 g/dL (ref 3.5–5.2)
Alkaline Phosphatase: 97 U/L (ref 39–117)
CO2: 31 mEq/L (ref 19–32)
Chloride: 101 mEq/L (ref 96–112)
GFR calc Af Amer: >60 mL/min (ref >60)
Potassium: 4.3 mEq/L (ref 3.5–5.1)
Total Bilirubin: 0.4 mg/dL (ref 0.3–1.2)

## 2010-04-25 LAB — DIFFERENTIAL
Basophils Absolute: 0.1 10*3/uL (ref 0.0–0.1)
Basophils Relative: 1 % (ref 0–1)
Eosinophils Absolute: 0.2 10*3/uL (ref 0.0–0.7)
Eosinophils Relative: 2 % (ref 0–5)
Monocytes Absolute: 0.6 10*3/uL (ref 0.1–1.0)

## 2010-04-25 LAB — URINE MICROSCOPIC-ADD ON

## 2010-04-25 LAB — URINALYSIS, ROUTINE W REFLEX MICROSCOPIC
Bilirubin Urine: NEGATIVE
Glucose, UA: NEGATIVE mg/dL
Hgb urine dipstick: NEGATIVE
Protein, ur: NEGATIVE mg/dL

## 2010-04-26 LAB — URINALYSIS, ROUTINE W REFLEX MICROSCOPIC
Bilirubin Urine: NEGATIVE
Bilirubin Urine: NEGATIVE
Glucose, UA: NEGATIVE mg/dL
Ketones, ur: NEGATIVE mg/dL
Ketones, ur: 15 mg/dL — AB
Protein, ur: NEGATIVE mg/dL
Protein, ur: 30 mg/dL — AB
Urobilinogen, UA: 0.2 mg/dL (ref 0.0–1.0)

## 2010-04-26 LAB — BASIC METABOLIC PANEL
CO2: 28 mEq/L (ref 19–32)
Calcium: 8.8 mg/dL (ref 8.4–10.5)
Glucose, Bld: 110 mg/dL — ABNORMAL HIGH (ref 70–99)
Sodium: 141 mEq/L (ref 135–145)

## 2010-04-26 LAB — URINE MICROSCOPIC-ADD ON

## 2010-04-26 LAB — CBC
HCT: 39.9 % (ref 36.0–46.0)
Hemoglobin: 13.3 g/dL (ref 12.0–15.0)
Hemoglobin: 13.2 g/dL (ref 12.0–15.0)
MCH: 31.3 pg (ref 26.0–34.0)
MCH: 31.1 pg (ref 26.0–34.0)
MCHC: 33.3 g/dL (ref 30.0–36.0)
MCV: 93.9 fL (ref 78.0–100.0)
MCV: 95 fL (ref 78.0–100.0)
Platelets: 356 K/uL (ref 150–400)
RBC: 4.25 MIL/uL (ref 3.87–5.11)
RBC: 4.24 MIL/uL (ref 3.87–5.11)
RDW: 13.1 % (ref 11.5–15.5)
WBC: 9.7 K/uL (ref 4.0–10.5)
WBC: 11.3 10*3/uL — ABNORMAL HIGH (ref 4.0–10.5)

## 2010-04-26 LAB — URINE CULTURE
Colony Count: 90000
Culture  Setup Time: 201112011350

## 2010-04-26 LAB — DIFFERENTIAL
Basophils Absolute: 0.1 K/uL (ref 0.0–0.1)
Basophils Relative: 1 % (ref 0–1)
Eosinophils Absolute: 0.3 10*3/uL (ref 0.0–0.7)
Eosinophils Absolute: 0.3 10*3/uL (ref 0.0–0.7)
Eosinophils Relative: 3 % (ref 0–5)
Eosinophils Relative: 3 % (ref 0–5)
Lymphocytes Relative: 31 % (ref 12–46)
Lymphocytes Relative: 25 % (ref 12–46)
Lymphs Abs: 3.1 K/uL (ref 0.7–4.0)
Lymphs Abs: 2.8 10*3/uL (ref 0.7–4.0)
Monocytes Absolute: 0.4 10*3/uL (ref 0.1–1.0)
Monocytes Relative: 4 % (ref 3–12)
Monocytes Relative: 6 % (ref 3–12)
Neutro Abs: 5.9 K/uL (ref 1.7–7.7)
Neutrophils Relative %: 61 % (ref 43–77)
Neutrophils Relative %: 65 % (ref 43–77)

## 2010-04-26 LAB — WET PREP, GENITAL
Trich, Wet Prep: NONE SEEN
Yeast Wet Prep HPF POC: NONE SEEN

## 2010-04-26 LAB — COMPREHENSIVE METABOLIC PANEL
ALT: 18 U/L (ref 0–35)
AST: 17 U/L (ref 0–37)
CO2: 31 mEq/L (ref 19–32)
Calcium: 8.7 mg/dL (ref 8.4–10.5)
Chloride: 99 mEq/L (ref 96–112)
GFR calc non Af Amer: >60 mL/min (ref >60)
Glucose, Bld: 121 mg/dL — ABNORMAL HIGH (ref 70–99)
Sodium: 138 mEq/L (ref 135–145)
Total Bilirubin: 0.2 mg/dL — ABNORMAL LOW (ref 0.3–1.2)

## 2010-04-26 LAB — BASIC METABOLIC PANEL WITH GFR
BUN: 6 mg/dL (ref 6–23)
Chloride: 104 meq/L (ref 96–112)
Creatinine, Ser: 0.79 mg/dL (ref 0.4–1.2)
GFR calc Af Amer: >60 mL/min (ref >60)
GFR calc non Af Amer: >60 mL/min (ref >60)
Potassium: 4.2 meq/L (ref 3.5–5.1)

## 2010-05-19 ENCOUNTER — Inpatient Hospital Stay (INDEPENDENT_AMBULATORY_CARE_PROVIDER_SITE_OTHER)
Admission: RE | Admit: 2010-05-19 | Discharge: 2010-05-19 | Disposition: A | Discharge status: Home / Self Care | Payer: Self-pay | Source: Ambulatory Visit | Attending: Family Medicine | Admitting: Family Medicine

## 2010-05-19 DIAGNOSIS — J42 Unspecified chronic bronchitis: Secondary | ICD-10-CM

## 2010-05-24 ENCOUNTER — Emergency Department (HOSPITAL_COMMUNITY): Payer: Medicaid Other

## 2010-05-24 ENCOUNTER — Inpatient Hospital Stay (INDEPENDENT_AMBULATORY_CARE_PROVIDER_SITE_OTHER)
Admission: RE | Admit: 2010-05-24 | Discharge: 2010-05-24 | Disposition: A | Discharge status: Home / Self Care | Payer: Medicaid Other | Source: Ambulatory Visit

## 2010-05-24 ENCOUNTER — Inpatient Hospital Stay (HOSPITAL_COMMUNITY)
Admission: EM | Admit: 2010-05-24 | Discharge: 2010-05-26 | DRG: 192 | Disposition: A | Discharge status: Home / Self Care | Payer: Medicaid Other | Attending: Cardiology | Admitting: Cardiology

## 2010-05-24 DIAGNOSIS — Z8541 Personal history of malignant neoplasm of cervix uteri: Secondary | ICD-10-CM

## 2010-05-24 DIAGNOSIS — J440 Chronic obstructive pulmonary disease with acute lower respiratory infection: Principal | ICD-10-CM | POA: Diagnosis present

## 2010-05-24 DIAGNOSIS — R079 Chest pain, unspecified: Secondary | ICD-10-CM

## 2010-05-24 DIAGNOSIS — Z79899 Other long term (current) drug therapy: Secondary | ICD-10-CM

## 2010-05-24 DIAGNOSIS — R0789 Other chest pain: Secondary | ICD-10-CM | POA: Diagnosis present

## 2010-05-24 DIAGNOSIS — Z880 Allergy status to penicillin: Secondary | ICD-10-CM

## 2010-05-24 DIAGNOSIS — Z881 Allergy status to other antibiotic agents status: Secondary | ICD-10-CM

## 2010-05-24 DIAGNOSIS — F988 Other specified behavioral and emotional disorders with onset usually occurring in childhood and adolescence: Secondary | ICD-10-CM | POA: Diagnosis present

## 2010-05-24 DIAGNOSIS — J209 Acute bronchitis, unspecified: Principal | ICD-10-CM | POA: Diagnosis present

## 2010-05-24 DIAGNOSIS — F341 Dysthymic disorder: Secondary | ICD-10-CM | POA: Diagnosis present

## 2010-05-24 DIAGNOSIS — F17200 Nicotine dependence, unspecified, uncomplicated: Secondary | ICD-10-CM | POA: Diagnosis present

## 2010-05-24 DIAGNOSIS — J449 Chronic obstructive pulmonary disease, unspecified: Secondary | ICD-10-CM

## 2010-05-24 DIAGNOSIS — R0602 Shortness of breath: Secondary | ICD-10-CM

## 2010-05-24 LAB — DIFFERENTIAL
Basophils Absolute: 0.1 10*3/uL (ref 0.0–0.1)
Basophils Relative: 1 % (ref 0–1)
Eosinophils Absolute: 0.2 10*3/uL (ref 0.0–0.7)
Eosinophils Relative: 2 % (ref 0–5)
Lymphs Abs: 2.8 10*3/uL (ref 0.7–4.0)
Neutrophils Relative %: 58 % (ref 43–77)

## 2010-05-24 LAB — CK TOTAL AND CKMB (NOT AT ARMC)
CK, MB: 1.5 ng/mL (ref 0.3–4.0)
Relative Index: INVALID (ref 0.0–2.5)
Total CK: 71 U/L (ref 7–177)
Total CK: 66 U/L (ref 7–177)

## 2010-05-24 LAB — APTT: aPTT: 84 seconds — ABNORMAL HIGH (ref 24–37)

## 2010-05-24 LAB — CBC
Platelets: 291 10*3/uL (ref 150–400)
RBC: 5.05 MIL/uL (ref 3.87–5.11)
RDW: 16 % — ABNORMAL HIGH (ref 11.5–15.5)
WBC: 8.5 10*3/uL (ref 4.0–10.5)

## 2010-05-24 LAB — BASIC METABOLIC PANEL
Chloride: 104 mEq/L (ref 96–112)
GFR calc Af Amer: >60 mL/min (ref >60)
GFR calc non Af Amer: >60 mL/min (ref >60)
Potassium: 3.8 mEq/L (ref 3.5–5.1)
Sodium: 138 mEq/L (ref 135–145)

## 2010-05-24 LAB — TROPONIN I: Troponin I: <0.01 ng/mL (ref 0.00–0.06)

## 2010-05-24 LAB — D-DIMER, QUANTITATIVE: D-Dimer, Quant: <0.22 ug/mL-FEU (ref 0.00–0.48)

## 2010-05-25 LAB — COMPREHENSIVE METABOLIC PANEL
ALT: 28 U/L (ref 0–35)
AST: 86 U/L — ABNORMAL HIGH (ref 0–37)
AST: 33 U/L (ref 0–37)
Albumin: 3.3 g/dL — ABNORMAL LOW (ref 3.5–5.2)
CO2: 29 mEq/L (ref 19–32)
CO2: 29 mEq/L (ref 19–32)
CO2: 25 mEq/L (ref 19–32)
Calcium: 8.9 mg/dL (ref 8.4–10.5)
Calcium: 9.1 mg/dL (ref 8.4–10.5)
Chloride: 102 mEq/L (ref 96–112)
Creatinine, Ser: 1.03 mg/dL (ref 0.4–1.2)
Creatinine, Ser: 1.1 mg/dL (ref 0.4–1.2)
Creatinine, Ser: 0.96 mg/dL (ref 0.4–1.2)
GFR calc Af Amer: >60 mL/min (ref >60)
GFR calc Af Amer: >60 mL/min (ref >60)
GFR calc Af Amer: >60 mL/min (ref >60)
GFR calc non Af Amer: 55 mL/min — ABNORMAL LOW (ref >60)
GFR calc non Af Amer: 52 mL/min — ABNORMAL LOW (ref >60)
GFR calc non Af Amer: >60 mL/min (ref >60)
Glucose, Bld: 81 mg/dL (ref 70–99)
Total Bilirubin: 1 mg/dL (ref 0.3–1.2)
Total Protein: 6.8 g/dL (ref 6.0–8.3)

## 2010-05-25 LAB — URINALYSIS, ROUTINE W REFLEX MICROSCOPIC
Ketones, ur: 15 mg/dL — AB
Ketones, ur: 15 mg/dL — AB
Nitrite: NEGATIVE
Nitrite: NEGATIVE
Protein, ur: 30 mg/dL — AB
Specific Gravity, Urine: 1.041 — ABNORMAL HIGH (ref 1.005–1.030)
Urobilinogen, UA: 1 mg/dL (ref 0.0–1.0)
Urobilinogen, UA: 1 mg/dL (ref 0.0–1.0)
pH: 6 (ref 5.0–8.0)

## 2010-05-25 LAB — CBC
MCH: 27.9 pg (ref 26.0–34.0)
MCHC: 31.2 g/dL (ref 30.0–36.0)
MCHC: 33.8 g/dL (ref 30.0–36.0)
MCV: 89.4 fL (ref 78.0–100.0)
MCV: 92.4 fL (ref 78.0–100.0)
MCV: 92.7 fL (ref 78.0–100.0)
Platelets: 237 10*3/uL (ref 150–400)
RBC: 5.41 MIL/uL — ABNORMAL HIGH (ref 3.87–5.11)
RDW: 16.2 % — ABNORMAL HIGH (ref 11.5–15.5)
RDW: 13.7 % (ref 11.5–15.5)
WBC: 8.2 10*3/uL (ref 4.0–10.5)
WBC: 9.2 10*3/uL (ref 4.0–10.5)

## 2010-05-25 LAB — DIFFERENTIAL
Basophils Absolute: 0.1 10*3/uL (ref 0.0–0.1)
Basophils Relative: 1 % (ref 0–1)
Eosinophils Absolute: 0.1 10*3/uL (ref 0.0–0.7)
Eosinophils Relative: 1 % (ref 0–5)
Lymphocytes Relative: 29 % (ref 12–46)
Lymphocytes Relative: 37 % (ref 12–46)
Lymphs Abs: 3.4 10*3/uL (ref 0.7–4.0)
Neutrophils Relative %: 55 % (ref 43–77)

## 2010-05-25 LAB — CARDIAC PANEL(CRET KIN+CKTOT+MB+TROPI)
CK, MB: 1.8 ng/mL (ref 0.3–4.0)
Relative Index: INVALID (ref 0.0–2.5)
Total CK: 83 U/L (ref 7–177)
Troponin I: 0.01 ng/mL (ref 0.00–0.06)

## 2010-05-25 LAB — URINE CULTURE: Colony Count: 75000

## 2010-05-25 LAB — URINE MICROSCOPIC-ADD ON

## 2010-05-25 LAB — LIPASE, BLOOD
Lipase: 17 U/L (ref 11–59)
Lipase: 20 U/L (ref 11–59)

## 2010-05-26 LAB — CBC
HCT: 40.8 % (ref 36.0–46.0)
Hemoglobin: 12.6 g/dL (ref 12.0–15.0)
MCHC: 30.9 g/dL (ref 30.0–36.0)
RDW: 15.8 % — ABNORMAL HIGH (ref 11.5–15.5)
WBC: 6.6 10*3/uL (ref 4.0–10.5)

## 2010-05-26 LAB — HEPARIN LEVEL (UNFRACTIONATED): Heparin Unfractionated: <0.1 IU/mL — ABNORMAL LOW (ref 0.30–0.70)

## 2010-05-26 NOTE — H&P (Signed)
NAME:  [name], [name] NO.:  [id number]  MEDICAL RECORD NO.:  [id number]           PATIENT TYPE:  I  LOCATION:  2927                         FACILITY:  [facility]  PHYSICIAN:  Cassell Clement, M.D. DATE OF BIRTH:  21-Feb-1955  DATE OF ADMISSION:  05/24/2010 DATE OF DISCHARGE:                             HISTORY & PHYSICAL   This is a 55 year old separated Caucasian female who was admitted through the [facility] Emergency Room as an unassigned patient.  She is admitted because of chest pain and shortness of breath.  The patient states that her shortness of breath and cough began about 7 days ago. About 4 days ago, she began having intermittent left-sided chest pain under her left breast.  The pain was somewhat worse with deep breathing. There was no hemoptysis.  She did not take any medicine for the pain over the weekend.  She came to the emergency room today where she was noted to have a lot of wheezing and cardiac enzymes were obtained and her troponin was elevated at 0.21, although her CK-MB was normal at 2.3. Her electrocardiogram shows no acute changes.  PAST MEDICAL HISTORY:  The patient has a complex past medical history. She has a long psychiatric history including anxiety, depression, and attention deficit disorder.  She has also had multiple previous surgical procedures including 3 upper abdominal ventral incisional hernia repairs and a suprapubic hernia repair with mesh as well as a right inguinal hernia repair.  She has had a previous hysterectomy and oophorectomy and she has had a previous vulvar excision for cancer in situ.  She has also had cholecystectomy.  She has had a previous neck fusion and she has had other orthopedic surgeries for chronic back pain.  The patient is allergic to PENICILLIN, ERYTHROMYCIN, and DOXYCYCLINE.  Her social history reveals that she lives in a mobile home by herself. She lives in the home behind her mother's home.  The  patient is separated.  She does not use alcohol.  She does not use illicit drugs. She is unable to work because of her psychiatric problems.  She does smoke about a pack of cigarettes a day.  Her family history reveals that her mother is elderly and still living. Her father died of cancer.  REVIEW OF SYSTEMS:  The patient has had chills and sweats.  She has had no visual change, no vertigo or photophobia.  She has had no skin rash. She has had shortness of breath and chest pain as noted as well as cough and wheezing.  She denies any change in genitourinary pattern.  She has had long history of depression and anxiety and is followed at the [facility] by a psychiatrist, but she is not sure of his name.  She thinks his name is Dr. Ander Slade.  Remainder of review of systems is negative in detail.  She does not have any known history of diabetes.  She does not have any history of thyroid problems.  All other systems are negative in detail.  PHYSICAL EXAMINATION:  VITAL SIGNS:  Her temperature is afebrile, pulse 73, respirations 18, blood pressure prior to  nitroglycerin 121/68, oxygen saturation 96%. GENERAL APPEARANCE:  Somewhat distractible middle-aged woman in no acute distress.  She is anxious. SKIN:  Warm and dry. HEENT:  Pupils are equal and reactive.  Extraocular movements are full. Sclerae are clear.  Mouth and pharynx benign. NECK:  No lymphadenopathy.  The thyroid is not enlarged or tender. CHEST:  Expiratory wheezing and rhonchi bilaterally, but worse on the left. HEART:  Quiet precordium without murmur, gallop, rub, or click.  There are no carotid bruits. The jugular venous pressure is normal. ABDOMEN:  Multiple previous scars.  The most recent colon incision from January 2012 appears to be healing well. EXTREMITIES:  No phlebitis or edema. NEUROLOGIC:  No motor or sensory deficit.  Her chest x-ray shows no acute disease.  Her electrocardiogram shows normal sinus rhythm  and mild nonspecific lateral ST-T wave changes.  LABORATORY STUDIES:  White count 8500, hemoglobin 14.2, hematocrit 44, platelets 291,000.  Sodium 138, potassium 3.8, BUN 9, creatinine 0.96. Blood sugar 102.  Her CK total is 71, CK-MB 2.3, troponin is 0.21. Liver function studies are pending.  IMPRESSION: 1. Atypical chest pain in the setting of recent severe cough and upper     respiratory infection, now with slightly positive troponins.  Rule     out coronary artery disease. 2. Chronic obstructive pulmonary disease with acute exacerbation of     bronchitis. 3. Longstanding psychiatric illness, which is not well characterized     at present, but includes mood disorder and attention deficit     disorder, depression, and anxiety. 4. Recent history of repair of colovaginal fistula on February 16, 2010,     by Dr. Johna Sheriff. 5. Multiple prior surgical procedures. 6. Ongoing cigarette abuse.  DISPOSITION AND PLAN:  We are going to admit to telemetry.  We will treat with IV heparin and IV nitroglycerin.  We will cycle enzymes and get serial EKGs.  We will keep n.p.o. in the morning for possible cath as enzymes rise overnight, otherwise plan more conservative workup.  At the present time, she is not a good candidate for [facility] because of her severe wheezing, but hopefully the wheezing will improve with treatment of her COPD and bronchitis.          ______________________________ Cassell Clement, M.D.     TB/MEDQ  D:  05/24/2010  T:  05/25/2010  Job:  [id number]  cc:   Lonia Blood, M.D.  Electronically Signed by Cassell Clement M.D. on 05/26/2010 12:32:14 PM

## 2010-05-31 NOTE — Discharge Summary (Signed)
[name], [name]              ACCOUNT NO.:  [id number]  MEDICAL RECORD NO.:  [id number]           PATIENT TYPE:  I  LOCATION:  2034                         FACILITY:  [facility]  PHYSICIAN:  Cassell Clement, M.D. DATE OF BIRTH:  1955-09-11  DATE OF ADMISSION:  05/24/2010 DATE OF DISCHARGE:  05/26/2010                              DISCHARGE SUMMARY   PROCEDURE:  Two-view chest x-ray.  PRIMARY FINAL DISCHARGE DIAGNOSIS:  Atypical chest pain secondary to bronchitis.  SECONDARY DIAGNOSES: 1. Allergy or intolerance to PENICILLIN, ERYTHROMYCIN, and     DOXYCYCLINE. 2. Ongoing tobacco use. 3. Chronic obstructive pulmonary disease. 4. Psychiatric illnesses, including mood disorder, attention deficit     disorder, depression, and anxiety. 5. History of colovaginal fistula with repair. 6. History of bilateral stenosing tenosynovitis with release. 7. Status post esophagogastroduodenoscopy and colonoscopy, as well as     laparoscopic cholecystectomy, and cervical spine surgery. 8. Status post abdominal hysterectomy with subsequent hernia repair.  TIME AT DISCHARGE:  36 minutes.  HOSPITAL COURSE:  [name] is a 55 year old female with no previous history of coronary artery disease.  She developed a productive cough with chills and fever and had some chest pain.  She came to the hospital where she was admitted for further evaluation and treatment.  Cardiac enzymes were negative for MI.  A lipid profile showed total cholesterol 164, triglycerides 167, HDL 42, LDL 89.  Her LFTs were mildly elevated with an alkaline phosphatase 139, SGOT 86, SGPT 60. Blood sugars were mildly elevated at 102 and 107.  A D-dimer was within normal limits.  She had no elevation in her white count.  She was started on antibiotics and remained afebrile.  A chest x-ray showed scarring in the lingula but no acute process.  She was started on antibiotics and Mucinex, and her condition improved.  On May 26, 2010, [name] was seen by Dr. Patty Sermons.  She is considered stable for discharge, to complete antibiotic therapy is an outpatient, and follow up with primary care and Psychiatry.  DISCHARGE INSTRUCTIONS: 1. Her activity level is to be increased gradually. 2. She is encouraged to stick to a low-sodium heart-healthy diet and     discontinue tobacco. 3. She is to call Dr. Mikeal Hawthorne for an appointment, as well as Psychiatry,     and keep all scheduled followups.  DISCHARGE MEDICATIONS: 1. Adderall 20 mg daily. 2. Lamotrigine 200 mg b.i.d. 3. Clomipramine 25 mg nightly. 4. Venlafaxine 150 mg b.i.d. 5. Xanax 1 mg half tablet in the morning and a whole tablet at night. 6. Carisoprodol 350 mg nightly. 7. Mucinex 600 mg b.i.d. 8. Multivitamins daily. 9. Percocet two tablets q.4 h. p.r.n. as prior to admission. 10.Advair 250/50 b.i.d. 11.ProAir inhaler two puffs q.i.d. p.r.n. 12.Saphris 1 tablet b.i.d. 13.Vitamin B12 daily. 14.Azithromycin 250 mg, complete a Z-Pak.     Theodore Demark, PA-C   ______________________________ Cassell Clement, M.D.    RB/MEDQ  D:  05/26/2010  T:  05/26/2010  Job:  [id number]  cc:   Toma Copier, MD Lonia Blood, M.D.  Electronically Signed by Theodore Demark PA-C on 05/28/2010 04:55:33 PM  Electronically Signed by Cassell Clement M.D. on 05/31/2010 02:25:37 PM

## 2010-06-28 NOTE — Assessment & Plan Note (Signed)
[name] comes to the pain management today.  I evaluated [name] via  the health and history form and 14-point review of systems.   [name] comes to [place] today complaining of neck pain, particularly left  greater than right, with suprascapular and levator scapular pain, left  hand pain.  A 8/10 subjective scale; sharp, stabbing, burning, is  constant with no temporal relation of the day, interferes with the most  activities of daily living.  Remotely, she has had injections by Dr.  Thayer Ohm, and she has also had surgical intervention, with persistent pain.  She has been taking up to 8 hydrocodone a day to try to control pain,  cautions were given.  She is made worse by bending and sitting, improves  with rest, heat, and medications.  It is throughout the entire day with  frequent night awakening, difficulty with walking, secondary to [name]  pain.  She is disabled, has been since 2002.  She also noted some  tingling in hand and decreased grip strength particularly to the left.  Occasional shortness of breath, but no wheezing and coughing, no COPD  with extensive cigarette use, greater than 75 pack year.  She is trying  to quit.  She has COPD, hypertension, arthritis, neck fusion, 2 hands  surgeries, cholecystectomy, and herniorrhaphy.  Denies chemical or  alcohol dependency.   FAMILY HISTORY:  Diabetes and lung disease.   Review of systems, family, and social history otherwise noncontributory  to pain problem.   PHYSICAL EXAMINATION:  GENERAL:  Pleasant female sitting comfortably in  bed.  Gait, affect, and appearance is normal and oriented x3.  HEENT:  Otherwise unremarkable.  CHEST:  Clear to auscultation and percussion with increased AP diameter.  CARDIAC:  Regular rate and rhythm without rub, murmur, or gallop.  ABDOMEN:  Soft, nontender, benign.  No hepatosplenomegaly.  MUSCULOSKELETAL:  She has diffuse paracervical, suprascapular, lumbar  myofascial discomfort.  Positive cervical  pseudocompression test, left  greater than right.  Slight decreased grip strength to the left.  Brachial radialis 1+ to the left, but otherwise intact neurologically.   IMPRESSION:  She has cervical spine spondylosis.   PLAN:  1. Do not recommend interventions at this time.  I have cautioned as      to acetaminophen exposure.  Recommend duragesic as a      pharmacokinetically smooth agent.  She is not narcotic naive and I      think we can start [name] 25 mcg to see how she does.  I will have [name]      come back in 2 weeks, I have reviewed this medication with [name]      extensively.  2. Other modifiable features in health profile, cigarette cessation      would be beneficial.  3. Consider interventions once we get [name] pain meds under control.      Consider further adjuvants.  I have reviewed [name] medications      attached to chart as well as [name] allergies.  Questions were      answered and discussed in lay terms, UDS full informed consent.  We      have also gone over the patient care agreement and opioid consent.      We will see [name] in 2 weeks.           ______________________________  Celene Kras, MD     HH/MedQ  D:  05/05/2008 14:55:26  T:  05/06/2008 03:01:11  Job #:  [id number]

## 2010-07-01 NOTE — Op Note (Signed)
[place]  Patient:    [name], [name]                     MRN: [id number] Proc. Date: 01/12/00 Adm. Date:  [id number] Attending:  Sonda Primes CC:         Mardene Celeste. Lurene Shadow, M.D.   Operative Report  PREOPERATIVE DIAGNOSIS:  Recurrent ventral hernia.  POSTOPERATIVE DIAGNOSIS:  Recurrent ventral hernia.  OPERATION PERFORMED:  Repair of ventral hernia with mesh.  SURGEON:  Mardene Celeste. Lurene Shadow, M.D.  ASSISTANT:  Marnee Spring. Wiliam Ke, M.D.  ANESTHESIA:  General with supplemental lidocaine and epinephrine 1%.  INDICATIONS FOR PROCEDURE:  This patient is a 55 year old woman who underwent laparoscopic ventral hernia repair in the past.  She returns now with hernia at the pubic end of her incision.  She is brought to the operating room now for repair.  DESCRIPTION OF PROCEDURE:  Following induction of satisfactory general anesthesia with the patient positioned supinely, the abdomen was routinely prepped and draped to be included in a sterile operative field.  I made a transverse incision through her old scar cicatrix, deepened this through the skin and subcutaneous tissue, carrying the dissection down to the hernia sac which was dissected free from all of the surrounding soft tissue.  The hernia sac was opened and the peritoneum sac and weakened fascia were excised.  I could see the most inferior portion of the dual mesh that was placed into the wound previously.  The peritoneum was then closed separately.  I then used an Ethicon plug and sewed this into the defect using a #1 Novofil suture.  This was done in a running fashion.  Following this, I placed a patch of mesh over the plug repair and sewed this in with #1 Novofil.  The repair appeared to be excellent.  Sponge, instrument and sharp counts were verified.  The subcutaneous tissues were closed with running 2-0 suture.  The skin was closed with Dermabond.  Sterile dressings applied.   Anesthetic reversed.  Patient removed from the operating room to the recovery room in stable condition having tolerated the procedure well. DD:  01/12/00 TD:  01/12/00 Job: [id number]

## 2010-07-01 NOTE — Op Note (Signed)
[name], [name] NO.:  [id number]   MEDICAL RECORD NO.:  [id number]          PATIENT TYPE:  INP   LOCATION:  2860                         FACILITY:  [facility]   PHYSICIAN:  Payton Doughty, M.D.      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   PROCEDURE:  C3-4 and C4-5 anterior cervical decompression and fusion with a  Reflex hybrid plate.   SURGEON:  Payton Doughty, M.D.   ANESTHESIA:  General anesthesia.  Prepped with Betadine and prepped and  scrubbed with alcohol wipe.   COMPLICATIONS:  None.   ASSISTANT:  [place].  Doctor assistant is Danae Orleans. Venetia Maxon, M.D.   INDICATIONS FOR PROCEDURE:  A 55 year old right-handed white lady with  severe cervical spondylosis at 3-4 and 4-5.   DESCRIPTION OF PROCEDURE:  Taken to the operating room, smoothly  anesthetized, and intubated.  Placed supine on the operating table in the  Halter head traction.  Following shave, prepped and draped in the usual  sterile fashion.  Skin was incised in the midline.  The medial border of the  sternocleidomastoid on the left side.  The platysma was identified and  elevated, divided, and undermined.  The sternocleidomastoid was identified  and medial dissection revealed the carotid artery retracted laterally to the  left.  Trachea and esophagus retracted laterally to the right.  This exposed  the bones in the anterior cervical spine.  Marker was placed and  intraoperative x-ray was obtained to confirm correctness of level.  Having  confirmed correctness of level, diskectomy was carried out at C3-4 and C4-5,  first under gross observation and then the operating microscope was brought  in and we used microdissection technique to remove the posterior  longitudinal ligament and dissect the  nerve roots bilaterally.  A 3-4 and 4-  5 there was some left-sided encroachment on the nerve roots that was greatly  relieved with diskectomy.  Following complete decompression, patellar  allografts were fashioned about 6 to 7 mm in thickness and tapped into  place.  A two-level Reflex hybrid plate was then placed using 12 mm screws,  two in C3, two in C4, and two in C5.  Intraoperative x-ray showed good  placement of bone grafts, plate, and screws.  The wounds were irrigated and  hemostasis assured.  The platysma and subcutaneous tissues were  reapproximated with 3-0 Vicryl in interrupted fashion.  The skin was closed  with 4-0 Vicryl in running subcuticular fashion.  Benzoin and Steri-Strips  were placed and made occlusive with Telfa and Op-Site.  The patient returned  to the recovery room in good condition.          ______________________________  Payton Doughty, M.D.    MWR/MEDQ  D:  09/27/2004  T:  09/27/2004  Job:  [id number]

## 2010-07-01 NOTE — H&P (Signed)
[facility]  Patient:    [name], [name] Visit Number: [id number] MRN: [id number]          Service Type: EMS Location: [place] Attending Physician:  Osvaldo Human Adm. Date:  08/15/2000 Disc. 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 The patient has a history of pelvic pain.  She is status post hysterectomy by another physician in 2000.  She reports that she has had severe lower pelvic pain since that time and the left is more painful than the right.  An ultrasound was performed and the ovaries appeared normal.  The patient has had two hernia operations.  She does have a past history of pelvic inflammatory disease and she has a past history of an intrauterine device.  She complains of dyspareunia.  She also does have some constipation and occasional rectal bleeding.  She denies nausea and vomiting.  She denies dysuria, urinary frequency, hematuria, urinary urgency, and kidney stones.  The patient had a lesion on her vulva that was biopsied.  The biopsy returned showing carcinoma in situ.  OBSTETRICAL HISTORY:          The patient has had one term vaginal delivery and one elective pregnancy termination.  PAST MEDICAL HISTORY:         The patient has a past history of asthma, emphysema, chronic constipation, and problems with her nerves.  CURRENT MEDICATIONS:          1. Paxil 60 mg each day.                               2. Risperdal 5 mg each day.                               3. Alprazolam 1 mg four times each day and 2 mg                                  at bedtime.                               4. Serevent.                               5. Flovent.                               6. Albuterol.                7. Tylenol p.r.n.                               8. Other allergy medications.  DRUG ALLERGIES:               CODEINE and PENICILLIN.  SOCIAL HISTORY:  The patient is separated and she is currently disabled.  She smokes 1-1/2 packs of cigarettes each day and she has done this for the past 30 years.  The patient denies alcohol use and other recreational drug uses.  REVIEW OF SYSTEMS:            Please see history of present illness.  FAMILY HISTORY:               The patient has a family history of asthma, cancer, high blood pressure, diabetes, liver problems, and joint problems.  PHYSICAL EXAMINATION:  VITAL SIGNS:                  Weight is 219 pounds.  HEENT:                        Within normal limits except for a sad-appearing female.  CHEST:                        Clear except for smokers rhonchi.  HEART:                        Regular rate and rhythm.  ABDOMEN:                      Nontender.  EXTREMITIES:                  Within normal limits.  NEUROLOGIC:                   Exam is grossly normal.  BACK:                         Within normal limits.  PELVIC:                       External genitalia is normal except for an area between 5 oclock and 7 oclock at the introitus that has raised papules. These areas are white to appearance.  The vagina is normal except for relaxation.  The cervix is absent.  The uterus is absent.  Adnexa - no masses appreciated.  Rectovaginal exam confirms.  ASSESSMENT:                   1. Pelvic pain of uncertain etiology.                               2. Dyspareunia.                               3. Chronic depression.                               4. Status post hysterectomy.                               5. 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  PLAN:                         The patient will undergo a diagnostic laparoscopy.  She will also undergo wide local excision of the carcinoma in situ  of the  introitus.                                She understands the indications for her procedure and she accepts the risks of, but not limited to, anesthetic complications, bleeding, infection, and possible damage to the surrounding organs. Attending Physician:  Osvaldo Human DD:  10/08/00 TD:  10/08/00 Job: [id number]

## 2010-07-01 NOTE — Op Note (Signed)
NAME:  [name], [name]              ACCOUNT NO.:  [id number]   MEDICAL RECORD NO.:  [id number]          PATIENT TYPE:  AMB   LOCATION:  SDS                          FACILITY:  [facility]   PHYSICIAN:  Artist Pais. Weingold, M.D.DATE OF BIRTH:  1955-06-01   DATE OF PROCEDURE:  06/28/2005  DATE OF DISCHARGE:  06/28/2005                                 OPERATIVE REPORT   PREOPERATIVE DIAGNOSIS:  Chronic right wrist stenosing tenosynovitis.   POSTOPERATIVE DIAGNOSIS:  Chronic right wrist stenosing tenosynovitis.   PROCEDURE:  Right wrist first dorsal compartment release.   SURGEON:  Artist Pais. Mina Marble, M.D.   ASSISTANT:  None.   ANESTHESIA:  General.   TOURNIQUET TIME:  20 minutes.   COMPLICATIONS:  None.   DRAINS:  None.   DESCRIPTION OF PROCEDURE:  The patient was taken to the operating room and  after the induction of adequate general anesthesia, the right upper  extremity was prepped and draped in the usual sterile fashion.  Esmarch was  used to exsanguinate the limb. Tourniquet was inflated to 250 mmHg at this  point in time.  Incision was made centered over the radial styloid, extended  proximally and distally 2 cm in either direction.  Skin was incised.  The  cephalic vein and superficial radial nerve were identified and retracted.  First dorsal compartment was identified, split at its dorsal most part with  a 15 blade.  The APL tendon which had multiple sheaths was lysed of all  adhesions.  There was a separate compartment for the EPB tendon which was  also released.  The wound was then thoroughly irrigated.  Hemostasis was  achieved with bipolar cautery.  It was loosely closed with 3-0 Prolene  subcuticular stitch.  Steri-Strips, 4x4's, fluffs, and compressive dressing  was applied as well as a radial dorsal splint.  The patient tolerated the  procedure well and went to the recovery room in stable condition.      Artist Pais Mina Marble, M.D.  Electronically Signed     MAW/MEDQ  D:  06/28/2005  T:  06/29/2005  Job:  [id number]

## 2010-07-01 NOTE — Op Note (Signed)
NAME:  ZARIN, HAGMANN              ACCOUNT NO.:  [id number]   MEDICAL RECORD NO.:  [id number]          PATIENT TYPE:  AMB   LOCATION:  ENDO                         FACILITY:  [facility]   PHYSICIAN:  Graylin Shiver, M.D.   DATE OF BIRTH:  Aug 06, 1955   DATE OF PROCEDURE:  11/25/2003  DATE OF DISCHARGE:                                 OPERATIVE REPORT   PROCEDURE:  Colonoscopy.   INDICATION:  This patient had a CT scan of the abdomen for evaluation of  abdominal pain.  The CT scan showed a poorly defined area of low attenuation  just anterior to the portal vein within the liver.  It was felt on this exam  that the area was worrisome for primary or metastatic involvement of the  liver.  A follow up MRI of this area revealed that this was an area of focal  fat, and there were no worrisome liver lesions.  The CT scan also showed a  somewhat thickened area of mucosa in the region of the hepatic flexure and  right colon of uncertain significance.  Further evaluation of the colon was  recommend, and therefore colonoscopy is being done.   Informed consent was obtained after explanation of the risks of bleeding,  infection, and perforation.   PREMEDICATIONS:  The procedure was done after an EGD with an additional 25  mcg of fentanyl given IV.   DESCRIPTION OF PROCEDURE:  With the patient in the left lateral decubitus  position, a rectal exam was performed.  No masses were felt.  The Olympus  colonoscope was inserted into the rectum and advanced around the colon to  the cecum.  Cecal landmarks were identified.  The cecum and ascending colon  were normal.  The transverse colon was normal.  The descending colon and  sigmoid showed some scattered diverticula.  The rectum was normal.  She  tolerated the procedure well without complications.   IMPRESSION:  1.  Diverticulosis of the left colon.  2.  No abnormalities seen in the area of the hepatic flexure or right colon,      as described on CT  scan.      SFG/MEDQ  D:  11/25/2003  T:  11/25/2003  Job:  [id number]   cc:   Fanny Dance. Rankins, M.D.  [address]  [place]  [place] [address]  Fax: [phone]

## 2010-07-01 NOTE — Consult Note (Signed)
NAME:  [name], PETROSKY NO.:  [id number]   MEDICAL RECORD NO.:  [id number]                   PATIENT TYPE:  OUT   LOCATION:  GYN                                  FACILITY:  [facility]   PHYSICIAN:  De Blanch, M.D.         DATE OF BIRTH:  07-23-1955   DATE OF CONSULTATION:  09/09/2003  DATE OF DISCHARGE:                                   CONSULTATION   HISTORY OF PRESENT ILLNESS:  This 55 year old white female was seen in  consultation at the request of Dr. Marline Backbone regarding management of  carcinoma in situ of the vulva.  The patient underwent multiple vulvar  biopsies on October 09, 2000.  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  Apparently the patient has been followed since that time with no additional  treatment.  On March 24, 2002 she had a vaginal cuff biopsy and a biopsy  from the introitus.  The introitus showed condyloma accuminata.  The vaginal  cuff was essentially negative.  Currently the patient has no particular  vulvar symptoms.  She specifically has no pruritus or any obvious lesions  that she is able to detect.   PAST MEDICAL HISTORY:  Medical illnesses:  Severe depression.   PAST SURGICAL HISTORY:  1. Abdominal hysterectomy, 2000.  2. Bilateral salpingo-oophorectomy, 2002.  3. Vulvar biopsies, 2002.  4. Ventral hernia repair, 2004.   CURRENT MEDICATIONS:  1. Paxil.  2. Lamintal.  3. Premarin.  4. Xanax.  5. Amphetamine salts.   ALLERGIES:  1. CODEINE.  2. ERYTHROMYCIN.   FAMILY HISTORY:  Negative for breast, colon, or ovarian cancer.   SOCIAL HISTORY:  The patient is separated.  She is disabled because of her  profound depression.  She smokes two packs per day, does not drink.   OBSTETRICAL HISTORY:  Gravida 1.   REVIEW OF SYSTEMS:  Essentially negative.   PHYSICAL EXAMINATION:  HEENT:  Negative.  NECK:  Supple without  thyromegaly.  ABDOMEN:  Obese, soft, nontender.  No mass, organomegaly, ascites, or  hernias are noted.  PELVIC:  EGBUS, vagina, bladder, urethra are grossly inspected and appear  normal.   Colposcopic examination of the vulva is performed after applying acetic  acid.  I do no see any lesions on the vulva, perineum, or anus.   IMPRESSION:  Past history of multifocal carcinoma in situ of the vulva,  status post excision, 2002.  No evidence of recurrent disease.   PLAN:  I would recommend the patient be examined on every six month basis  and biopsies be obtained if any obvious lesions are identified.  I would not  necessarily perform a colposcopy, but would use visual evaluation.  Pap  smear should also be repeated at six month intervals.   The patient returned to the care of Dr. Marline Backbone.   All the patient's questions are answered as are  the questions of her mother.                                               De Blanch, M.D.    DC/MEDQ  D:  09/09/2003  T:  09/09/2003  Job:  [id number]   cc:   Janine Limbo, M.D.  [address]  [place]  [place] [address]  Fax: [phone]   Telford Nab, R.N.  [phone] [address]  [place], [place] [address]

## 2010-07-01 NOTE — H&P (Signed)
NAME:  [name], [name] NO.:  [id number]   MEDICAL RECORD NO.:  [id number]          PATIENT TYPE:  INP   LOCATION:                               FACILITY:  [facility]   PHYSICIAN:  Payton Doughty, M.D.      DATE OF BIRTH:  [birth year]   DATE OF ADMISSION:  09/27/2004  DATE OF DISCHARGE:                                HISTORY & PHYSICAL   ADMISSION DIAGNOSIS:  Cervical spondylosis, C3-4 and C4-5.   This is a 55 year old left-handed white lady who since November has had pain  in her neck and her left shoulder __________ proximally, scapular pain and  tension across her shoulders. She has undergone cervical epidural steroids  and came to my office in July and has disk disease at C3-4 and C4-5 and is  now here for an anterior decompression and fusion at C3-4 and C4-5.   MEDICAL HISTORY:  Depression and COPD.   MEDICATIONS:  She takes Os-Cal b.i.d. for osteoporosis, Atrovent one puff  b.i.d., Nexium 40 mg once a day, Paxil 120 mg at bedtime for depression,  Toprol XL 50 mg daily, an Advair puffer b.i.d., amphetamine 20 mg daily,  Benicar 40/25 once a day for hypertension. She uses hydrocodone on a p.r.n.  basis and Xanax 2 mg b.i.d. for nervousness.   ALLERGIES:  PENICILLIN and ERYTHROMYCIN.   SURGICAL HISTORY:  Hemorrhoids, hysterectomy, oophorectomy, A-P resection,  precancerous lesion, cholecystectomy, hernia, and skin cancer.   SOCIAL HISTORY:  She smokes a pack of cigarettes a day. She does not drink  alcohol. She is disabled because of her nervousness.   FAMILY HISTORY:  Mother is 5 with an MI and COPD. Father died of COPD at  42.   REVIEW OF SYSTEMS:  Remarkable for night sweats, hearing loss, tinnitus,  hypertension, leg pain, COPD, asthma, and shortness of breath. She had  fractures in her right arm and left arm weakness with left arm pain and leg  pain, arthritis, neck pain. History of skin cancer. Difficulty with memory.  Difficulty with blurred vision.  Anxiety, depression, and hormone problems.   PHYSICAL EXAMINATION:  HEENT: Within normal limits.  NECK: She has reasonable range of motion of her neck.  CHEST: Diffuse wheezes and crackles.  CARDIAC: Regular rate and rhythm.  ABDOMEN: No hepatosplenomegaly.  EXTREMITIES: Without clubbing or cyanosis. Peripheral pulses are good.  GU: Deferred.  NEUROLOGIC: She is awake, alert, and oriented. Cranial nerves are intact.  Motor exam shows 5/5 strength throughout the upper extremities. Sensory  dysesthesia is described in the left C4 distribution. Reflexes are 2 at the  biceps, 2 at the triceps, 2 at the brachial radialis. She has a positive  Hoffman's bilaterally. Knee reflexes are slightly facilitated.   She comes in accompanied with an MRI that demonstrates spondylitic change at  C3-4 and C4-5 with a spur off the left side at C3-4 and slightly more to the  right at C4-5 with a large central component.   CLINICAL IMPRESSION:  Cervical spondylosis, distal radicular pain, and early  myelopathy.   PLAN:  Anterior decompression C3-4 and  C4-5. The risks and benefits of this  approach have been discussed with her and she wishes to proceed.           ______________________________  Payton Doughty, M.D.     MWR/MEDQ  D:  09/27/2004  T:  09/27/2004  Job:  [id number]

## 2010-07-01 NOTE — Op Note (Signed)
NAME:  [name], [name]              ACCOUNT NO.:  [id number]   MEDICAL RECORD NO.:  [id number]          PATIENT TYPE:  AMB   LOCATION:  SDS                          FACILITY:  [facility]   PHYSICIAN:  Artist Pais. Weingold, M.D.DATE OF BIRTH:  December 30, 1955   DATE OF PROCEDURE:  DATE OF DISCHARGE:                                 OPERATIVE REPORT   PREOPERATIVE DIAGNOSIS:  Left wrist chronic stenosing tenosynovitis.   POSTOPERATIVE DIAGNOSIS:  Left wrist first dorsal compartment release.   SURGEON:  Artist Pais. Mina Marble, M.D.   ASSISTANT:  None.   ANESTHESIA:  General.   TOURNIQUET TIME:  10 minutes.   COMPLICATIONS:  No complications.   DRAINS:  No drains.   DESCRIPTION OF PROCEDURE:  The patient was taken to the operating room.  After induction of adequate general anesthesia, the left upper extremity was  prepped and draped in usual sterile fashion.  An Esmarch was used to  exsanguinate the limb and the tourniquet was then inflated to 250 mmHg.  At  this point in time an incision was made over the dorsal medial aspect of the  left wrist.  The skin was incised.  Care was taken to identify and retract  branches of the superficial radial nerve and cephalic vein.  The first  dorsal compartment was identified.  It was incised at the dorsum most  extent.  The APL and EPB tendons were lysed of all adhesions.  There was  significant swelling about these tendons and a synovectomy was performed of  the tendons.  The wound was then irrigated and loosely closed with a 3-0  Prolene subcuticular stitch.  Steri-Strips were __________ and a compression  dressing was applied as well as a radial gutter splint.  The patient  tolerated the procedure well, went to the recovery room in stable fashion.      Artist Pais Mina Marble, M.D.  Electronically Signed     MAW/MEDQ  D:  10/04/2005  T:  10/04/2005  Job:  [id number]

## 2010-07-01 NOTE — Op Note (Signed)
NAME:  [name], [name]              ACCOUNT NO.:  [id number]   MEDICAL RECORD NO.:  [id number]          PATIENT TYPE:  AMB   LOCATION:  ENDO                         FACILITY:  [facility]   PHYSICIAN:  Graylin Shiver, M.D.   DATE OF BIRTH:  Mar 05, 1955   DATE OF PROCEDURE:  11/25/2003  DATE OF DISCHARGE:                                 OPERATIVE REPORT   Esophagogastroduodenoscopy with biopsy for CLOtest.   INDICATIONS:  Epigastric abdominal pain.   Informed consent was obtained after explanation of the risks of bleeding,  infection, and perforation.   PREMEDICATION:  Fentanyl 100 mcg IV, Versed 10 mg IV.   PROCEDURE:  With the patient in the left lateral decubitus position, the  Olympus gastroscope was inserted into the oropharynx and passed into the  esophagus.  It was advanced down the esophagus, then into the stomach and  into the duodenum.  The second portion and bulb of the duodenum looked  normal.  The stomach showed a diffuse gastritis.  Biopsy for CLOtest was  obtained.  No ulcers or erosions were seen.  No lesions other than gastritis  were seen in the fundus or cardia on retroflexion.  The esophagus looked  normal in its entirety.  She tolerated the procedure well without  complications.   IMPRESSION:  Diffuse gastritis.   PLAN:  The CLOtest will be checked.  The patient will remain on Nexium 40 mg  daily.      SFG/MEDQ  D:  11/25/2003  T:  11/25/2003  Job:  [id number]   cc:   Fanny Dance. Rankins, M.D.  [address]  [place]  [place] [address]  Fax: [phone]

## 2010-07-01 NOTE — Op Note (Signed)
[place]  Patient:    [name], [name]                     MRN: [id number] Proc. Date: 04/27/99 Attending:  Sonda Primes CC:         Mardene Celeste. Lurene Shadow, M.D. x 2                           Operative Report  PREOPERATIVE DIAGNOSIS:  Ventral hernia, lower midline.  POSTOPERATIVE DIAGNOSIS:  Ventral hernia, lower midline.  PROCEDURE:  Laparoscopic ventral hernia repair with dual mesh.  SURGEON:  Mardene Celeste. Lurene Shadow, M.D.  ASSISTANT:  Marnee Spring. Wiliam Ke, M.D.  ANESTHESIA:  General anesthesia.  INDICATIONS:  This patient is a 55 year old woman with a large ventral hernia in the lower midline abdomen following a total abdominal hysterectomy.  She is brought to the operating room now for repair.  DESCRIPTION OF PROCEDURE:  Following the induction of anesthesia, the patient was positioned supinely.  The abdomen was prepped and draped to be included in a sterile operative field.  The open laparoscopy created in the epigastrium in the midline with insertion of the Hasson cannula.  Insufflation of the peritoneal cavity to 14 mmHg pressure.  Under direct vision, two lateral 5 mm ports were placed.  Exploration showed that there were two loops of small bowel incarcerated within the hernia and these were carefully dissected free and removed.  The hernia diameter was then measured and noted to be approximately 12 x 12 cm in diameter and an appropriate size Gore-Tex dual mesh was then trimmed and made so as to allow  approximately a 3 cm overlap on all sides of the hernia.  The dual mesh was inserted into the abdomen and secured to the anterior abdominal wall with preplaced sutures of #1 Prolene.  The mesh was then stapled down to the abdominal wall with the stapler.  I placed three additional sutures into the abdominal wall through the mesh for further enforcement.  The repair appeared to be intact.  Sponge, needle, and instrument counts  were verified.  The pneumoperitoneum allowed to deflate. All trocars were removed under direct vision.  The exterior abdominal wall wounds were all closed with 4-0 Monocryl and then reinforced with Steri-Strips.  Sterile dressing was applied.  The anesthetic was reversed.  The patient was removed from the operating room to the recovery room in stable condition having tolerated the procedure well. DD:  04/27/99 TD:  04/27/99 Job: [id number]

## 2010-07-01 NOTE — Op Note (Signed)
[facility], In of [facility]  Patient:    [name], [name] Visit Number: [id number] MRN: [id number]          Service Type: DSU Location: [facility] Attending Physician:  Leonard Schwartz Proc. Date: 10/09/00 Adm. Date:  10/09/2000 Disc. Date: 10/09/2000                             Operative Report  PREOPERATIVE DIAGNOSES:       1. Pelvic pain.                               2. Obesity (weight 219 lb).                               3. Carcinoma in situ of the vulva.                               4. Status post hysterectomy.                               5. Status post Burch cystourethropexy and now                                  pelvic relaxation.  POSTOPERATIVE DIAGNOSES:      1. Pelvic pain.                               2. Obesity (weight 219 lb).                               3. Carcinoma in situ of the vulva.                               4. Status post hysterectomy.                               5. Status post Burch cystourethropexy and now                                  pelvic relaxation.                               6. Pelvic adhesive disease.  PROCEDURES:                   1. Diagnostic laparoscopy.                               2. Laparoscopic lysis of adhesions.                               3. Laparoscopic bilateral salpingo-oophorectomy.  4. Selective perineal biopsies.                               5. Wide local incision of the vulva.                               6. Perineal repair.                               7. Cystoscopy.  SURGEON:                      Janine Limbo, M.D.  ANESTHESIA:                   General.  INDICATIONS:                  The patient is a 55 year old female who presents with pelvic pain.  The patient is status post total abdominal hysterectomy and status post Burch cystourethropexy.  The patient reports that her discomfort has progressed to the point that she is having difficulty  performing her normal daily functions.  The patient understands the indications for her procedure and she accepts the risk of but not limited to anesthetic complications, bleeding, infection, and possible damage to surrounding organs. She understands that no guarantees can be given concerning the total relief of her pelvic pain.  FINDINGS:                     The uterus was surgically absent.  There was a large amount of adipose tissue present in the pelvis including adipose tissue in the omentum.  There were dense adhesions present between the omentum, the bowel, the pelvic side walls, and the cul-de-sacs.  There were dense adhesions between the bowel and the omentum and the anterior abdominal wall.  The patient had previously had a hernia repair and the bowel was densely adhered to the anterior abdominal wall where it appeared the hernia had been repaired. Because extensive lysis of adhesions was required prior to the oophorectomy, I performed cystoscopy to document that the bladder was without injury.  There did not appear to  be any damage to the bladder.  The ureters were documented to be patent.  The patient had carcinoma in situ of the vulva.  The lesion was white in appearance and slightly elevated.  The lesion was present at the introitus at the 6 oclock position.  Biopsies were taken of what appeared to be normal tissue at the periphery of the lesion.  The samples were sent for frozen section and the pathologist confirmed that there was no evidence of vulvar intraepithelial neoplasia at the 12 oclock, 3 oclock and 6 oclock positions.  Our initial sample from the 9 oclock position did show neoplasia, and we excised tissue further.  We did document normal tissue before we reached out limits of dissection at the 9 oclock position.  The surgery was extensive, and the surgical time was approximately 3 hours and 40 minutes.  DESCRIPTION OF PROCEDURE:     The patient was taken to the  operating room, where a general anesthetic was given.  The patients abdomen, perineum and vagina were prepped with multiple layers of Betadine.  Examination under anesthesia was performed.  A Foley catheter was placed in the  bladder.  A padded sponge stick was placed in the vagina.  The patient was sterilely draped.  The subumbilical area was injected with 0.5% Marcaine without epinephrine.  A subumbilical incision was made and a Veress needle was inserted into the abdominal cavity without difficulty.  Proper placement was confirmed using the saline drop test.  A pneumoperitoneum was then obtained. The laparoscopic trocar and then the laparoscope were substituted for the Veress needle.  Two suprapubic incisions were made and two 5 mm trocars were placed in the lower abdomen after injecting the skin with 0.5% Marcaine. Pictures were taken of the patients pelvic anatomy.  Adhesions were dense, and extensive lysis of adhesions was required.  A combination of sharp and blunt dissection was used.  Care was taken not to damage the bowel because there were areas where the bowel was densely adhered to the anterior abdominal wall at the level of the patients prior hernia repair.  We did err on the side of the fascia as we removed the bowel because we did not want to damage the bowel.  The adhesions were lysed between the bowel and the omentum and the anterior abdominal wall.  The cul-de-sac was then freed of adhesions.  It was difficult to identify both ovaries because of extensive adhesions.  Care was taken to lyse the adhesions between the ovary and the adjacent tissues on the left.  We tried to identify the ureter on the left, and we believe that we did find the ureter.  We traced its course, and terminal ileum appeared to be distant from our operative areas.  The left round ligament was identified, cauterized, cut and then skeletonized.  The left infundibulopelvic ligament was then identified  and then skeletonized.  Two Endoloop sutures of 0 Vicryl were placed around the left infundibulopelvic ligament.  The infundibulopelvic ligament was cut.  The ovary from the left was then cut in three smaller  pieces and removed through the umbilical port.  Hemost]asis was noted to be adequate.  We then skeletonized the right ovary from the adjacent scar tissue. Attempts were made to identify the right ureter and follow its course through the pelvis.  The round ligament on the right was identified, cauterized, and cut.  The right infundibulopelvic ligament was skeletonized.  Two Endoloop sutures of 0 Vicryl were placed around the right infundibulopelvic ligament and tied securely.  The right infundibulopelvic ligament was cut and the ovary was isolated in the midpelvis.  The right ovary was then cut into three pieces and removed through the umbilical port.  The pelvis was vigorously irrigated. Hemostasis was noted to be adequate.  The bowel was carefully inspected and there was no evidence of damage to the bowel.  The pneumoperitoneum was then allowed to escape.  All instruments were removed.  The incisions were closed using deep and superficial sutures of 4-0 Vicryl.  We then performed cystoscopy because we wanted to document the patency of the ureters.  The pelvic dissection had been extensive.  The patient was given an ampule of indigo carmine.  We then used the cystoscope to positively identify the ureteral orifices and to document that blue dye was passed through both ureteral openings.  The bladder was inspected and there was no evidence of damage to the inner bladder.  We then used acetic acid to coat the posterior perineum.  The pathologic lesion was identified.  The lesion on the perineum was then injected with 0.5% Marcaine with epinephrine.  We then obtained  biopsy from the periphery of the lesion at the 6 oclock position, 3 oclock position, 12 oclock position and 9 oclock  position.  The surface area measured approximately 4 x 4 cm.  Those samples were sent for frozen section analysis.  The 12 oclock, 3 oclock, and 6 oclock positions were noted to have tissue without evidence of vulvar intraepithelial neoplasia.  There was mild vulvar intraepithelial neoplasia at the 9 oclock position.  We then removed another piece of tissue further laterally at the 9 oclock position. Again, vulvar neoplasia was noted.  We obtained another sample further laterally, and there was no evidence of tissue abnormality at that point.  we then excised the vulvar tissue inside the margins we had outlined.  Hemostasis was achieved using the Bovie cautery.  We reinforced the perineal fascia using 0 Vicryl.  The vaginal mucosa was then closed using a running locking suture of 2-0 Vicryl.  The vaginal mucosa and the mucosa of the perineal body were also approximated with 4-0 Vicryl.  We used a standard obstetrical closure. at this point, hemostasis was noted to be adequate at the introitus.  The vaginal opening could easily accommodate two fingers width.  This having been completed, we terminated our procedure.  Sponge, needle and instrument counts were correct on two occasions.  Estimated blood loss was less than 30 cc. Total operative time was approximately 3 hours and 40 minutes.  The patient was awakened from her anesthetic without difficulty and taken to the recovery room in stable condition.  FOLLOW-UP INSTRUCTIONS:       The patient was given Tylox, 1-2 p.o. q.4h. p.r.n. pain (the patient reported that Tylox worked better for her than other pain medications, and she has tolerated this fine in the past).  She will return to see Dr. Stefano Gaul in 2-3 weeks for a follow-up examination.  She was given a copy of the postoperative instruction sheet as prepared by the [facility] for patients who have undergone a diagnostic laparoscopy.  The patient was given a  prescription for Dermoplast to aid in the healing of her perineum.  She was encouraged to sit in a tub of warm water b.i.d. Attending Physician:  Leonard Schwartz DD:  10/11/00 TD:  10/11/00 Job: [id number]

## 2010-07-01 NOTE — Op Note (Signed)
[place]  Patient:    [name], [name] Visit Number: [id number] MRN: [id number]          Service Type: DSU Location: [facility] 2550 07 Attending Physician:  Sonda Primes Dictated by:   Mardene Celeste. Lurene Shadow, M.D. Proc. Date: 04/03/01 Admit Date:  02/15/2001 Discharge Date: 02/15/2001   CC:         Luisa Hart L. Lurene Shadow, M.D. 2 copies                           Operative Report  PREOPERATIVE DIAGNOSIS:  Chronic calculus cholecystitis.  POSTOPERATIVE DIAGNOSIS:  Chronic calculus cholecystitis.  PROCEDURE:  Laparoscopic cholecystectomy.  SURGEON:  Mardene Celeste. Lurene Shadow, M.D.  ASSISTANT:  Marnee Spring. Wiliam Ke, M.D.  ANESTHESIA:  General.  INDICATIONS FOR PROCEDURE:  This patient is a 55 year old woman with chronic bronchitis.  She is status post ventral hernia repair, and started developing epigastric and right upper quadrant pain with radiation to her back, nausea, and associated emesis.  These symptoms brought on usually after food. Evaluated with abdominal ultrasound which shows cholelithiasis without any evidence of acute cholecystitis.  Liver function tests were all within normal limits.  She is brought now to the operating room for laparoscopic cholecystectomy after the risks and benefits of surgery have been fully discussed, all questions answered, and she gives consent.  DESCRIPTION OF PROCEDURE:  Following the induction of satisfactory anesthesia with the patient positioned supinely, the abdomen was routinely prepped and draped to be included in the sterile operative field.  Open laparoscopy created at the umbilicus with insertion of a Hasson cannula.  Insufflation of the peritoneal cavity to 14 mmHg pressure using carbon dioxide.  The camera is inserted.  Visual exploration of the abdomen carried out.  Liver appeared to be somewhat fatty.  There were some adhesions up to the gallbladder, however, the gallbladder did not appear acutely  inflamed.  The liver edge was somewhat rounded.  Duodenal sweep appeared to be normal.  Anterior gastric wall appeared to be normal.  None of the small nor large intestinal views appeared to be abnormal.  Under direct vision, epigastric and lateral ports were placed.  The gallbladder was grasped and retracted cephalad, as adhesions of the gallbladder were dissected down, carrying dissection down to the ampulla.  The ampulla was then grasped, and dissection carried down to dissect away the cystic artery and cystic duct from the hepatoduodenal ligament.  The cystic artery was traced up to its entry into the gallbladder wall, and that was doubly clipped and transected.  The cystic duct was similarly identified going into the ampulla of the gallbladder, and that was also doubly clipped and transected.  The gallbladder was then dissected free from the liver bed using electrocautery and maintaining hemostasis throughout the course of the dissection.  At the end of the dissection, all areas of the liver bed were checked for hemostasis.  Additional bleeding points treated with electrocautery.  The subhepatic space was then thoroughly irrigated with multiple ______ of saline.  The gallbladder was then retrieved through the umbilical port without difficulty.  Sponge, needle, and Sharp counts were then verified, the pneumoperitoneum allowed to deflate after the trocars had been removed under direct vision.  Wounds closed in layers then as follows: Umbilical wound in two layers with 0 Dexon and 4-0 Dexon, epigastric and lateral flank wounds closed with 4-0 Dexon sutures, all the wounds were reinforced with Steri-Strips, and sterile  dressings applied.  Anesthetic reversed, the patient was removed from the operating room to the recovery room in stable condition.  She tolerated the procedure well. Dictated by:   Mardene Celeste. Lurene Shadow, M.D. Attending Physician:  Sonda Primes DD:  04/03/01 TD:   04/03/01 Job: [id number]

## 2010-08-05 ENCOUNTER — Encounter (INDEPENDENT_AMBULATORY_CARE_PROVIDER_SITE_OTHER): Payer: Self-pay | Admitting: General Surgery

## 2010-09-09 ENCOUNTER — Ambulatory Visit (INDEPENDENT_AMBULATORY_CARE_PROVIDER_SITE_OTHER): Payer: Medicaid Other | Admitting: General Surgery

## 2010-09-09 ENCOUNTER — Other Ambulatory Visit (INDEPENDENT_AMBULATORY_CARE_PROVIDER_SITE_OTHER): Payer: Self-pay | Admitting: General Surgery

## 2010-09-09 ENCOUNTER — Encounter (INDEPENDENT_AMBULATORY_CARE_PROVIDER_SITE_OTHER): Payer: Self-pay | Admitting: General Surgery

## 2010-09-09 VITALS — BP 122/80 | HR 72 | Temp 97.0°F

## 2010-09-09 DIAGNOSIS — T8149XA Infection following a procedure, other surgical site, initial encounter: Secondary | ICD-10-CM | POA: Insufficient documentation

## 2010-09-09 DIAGNOSIS — T8140XA Infection following a procedure, unspecified, initial encounter: Secondary | ICD-10-CM

## 2010-09-09 NOTE — Patient Instructions (Signed)
Call to schedule surgery

## 2010-09-09 NOTE — Progress Notes (Signed)
Patient returns for long-term followup of her chronic wound infection post colectomy. She has mesh in her abdominal wall from previous hernia repairs. I have probed the area with a crochet hook and it feels like this tracts down to the mesh in the abdominal wall. She continues to have intermittent drainage from this area .  On examination there is a sinus tract with some purulent drainage in her low midline incision without surrounding erythema or induration. Her abdomen is generally soft and nontender. There is also a small umbilical incisional hernia which is nontender.  Assessment and plan: Chronic wound infection which I believe is secondary to contaminated mesh from previous surgery . I think this will need to be debrided if we are to have any hope of healing. We discussed the procedure. She understands that this may not heal even if we debrided the mesh. I would initially planned just limited debridement. Plan this under general anesthesia with at least overnight hospitalization. She would like some time to think this over and will call me in the next week or 2.

## 2010-10-03 ENCOUNTER — Other Ambulatory Visit (INDEPENDENT_AMBULATORY_CARE_PROVIDER_SITE_OTHER): Payer: Self-pay

## 2010-10-03 DIAGNOSIS — R1084 Generalized abdominal pain: Secondary | ICD-10-CM

## 2010-10-03 MED ORDER — HYDROCODONE-ACETAMINOPHEN 5-500 MG PO TABS: ORAL_TABLET | ORAL | Status: DC

## 2010-10-04 ENCOUNTER — Other Ambulatory Visit (INDEPENDENT_AMBULATORY_CARE_PROVIDER_SITE_OTHER): Payer: Self-pay | Admitting: General Surgery

## 2010-10-04 ENCOUNTER — Encounter (HOSPITAL_COMMUNITY): Payer: Medicaid Other

## 2010-10-04 LAB — BASIC METABOLIC PANEL
Calcium: 9.4 mg/dL (ref 8.4–10.5)
GFR calc Af Amer: >60 mL/min (ref >60)
GFR calc non Af Amer: >60 mL/min (ref >60)
Potassium: 4 mEq/L (ref 3.5–5.1)
Sodium: 139 mEq/L (ref 135–145)

## 2010-10-04 LAB — CBC
MCV: 90.9 fL (ref 78.0–100.0)
Platelets: 287 10*3/uL (ref 150–400)
RBC: 4.72 MIL/uL (ref 3.87–5.11)
RDW: 13.8 % (ref 11.5–15.5)
WBC: 10.7 10*3/uL — ABNORMAL HIGH (ref 4.0–10.5)

## 2010-10-04 LAB — SURGICAL PCR SCREEN: Staphylococcus aureus: NEGATIVE

## 2010-10-11 ENCOUNTER — Observation Stay (HOSPITAL_COMMUNITY)
Admission: AD | Admit: 2010-10-11 | Discharge: 2010-10-12 | Disposition: A | Discharge status: Home / Self Care | Payer: Medicaid Other | Source: Ambulatory Visit | Attending: General Surgery | Admitting: General Surgery

## 2010-10-11 ENCOUNTER — Other Ambulatory Visit (INDEPENDENT_AMBULATORY_CARE_PROVIDER_SITE_OTHER): Payer: Self-pay | Admitting: General Surgery

## 2010-10-11 DIAGNOSIS — L03319 Cellulitis of trunk, unspecified: Secondary | ICD-10-CM

## 2010-10-11 DIAGNOSIS — J449 Chronic obstructive pulmonary disease, unspecified: Secondary | ICD-10-CM | POA: Insufficient documentation

## 2010-10-11 DIAGNOSIS — B999 Unspecified infectious disease: Secondary | ICD-10-CM | POA: Insufficient documentation

## 2010-10-11 DIAGNOSIS — J4489 Other specified chronic obstructive pulmonary disease: Secondary | ICD-10-CM | POA: Insufficient documentation

## 2010-10-11 DIAGNOSIS — Z01812 Encounter for preprocedural laboratory examination: Secondary | ICD-10-CM | POA: Insufficient documentation

## 2010-10-11 DIAGNOSIS — L02219 Cutaneous abscess of trunk, unspecified: Secondary | ICD-10-CM

## 2010-10-11 DIAGNOSIS — T8579XA Infection and inflammatory reaction due to other internal prosthetic devices, implants and grafts, initial encounter: Principal | ICD-10-CM | POA: Insufficient documentation

## 2010-10-11 DIAGNOSIS — Y838 Other surgical procedures as the cause of abnormal reaction of the patient, or of later complication, without mention of misadventure at the time of the procedure: Secondary | ICD-10-CM | POA: Insufficient documentation

## 2010-10-11 DIAGNOSIS — T8183XA Persistent postprocedural fistula, initial encounter: Secondary | ICD-10-CM | POA: Insufficient documentation

## 2010-10-15 LAB — CULTURE, ROUTINE-ABSCESS

## 2010-10-16 LAB — ANAEROBIC CULTURE

## 2010-10-21 NOTE — Op Note (Signed)
[name], [name] NO.:  [id number]  MEDICAL RECORD NO.:  [id number]  LOCATION:  1533                         FACILITY:  [facility]  PHYSICIAN:  Sharlet Salina T. [name], M.D.DATE OF BIRTH:  [birth year]  DATE OF PROCEDURE:  10/11/2010 DATE OF DISCHARGE:                              OPERATIVE REPORT   PREOPERATIVE DIAGNOSES:  Chronic infection, involving mesh, lower abdominal wall.  POSTOPERATIVE DIAGNOSIS:  Chronic infection, involving mesh, lower abdominal wall.  SURGICAL PROCEDURE:  Debridement of abdominal wall and infected mesh.  SURGEON:  [name]. April Bennison, MD  ANESTHESIA:  General.  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  She had had a history prior to this of at least 2 ventral hernia repairs of her lower abdominal wall with permanent synthetic mesh.  She had generally done well following her colectomy, but has had a persistent draining sinus tract up to her lower midline wound with intermittent purulent drainage.  On probing this tract in the office, I felt a gritty material down near the midline abdominal wall, consistent with mesh. This has failed to improve over many months of wound care and intermittent antibiotics and I have recommended proceeding with debridement of the abdominal wall and mesh.  We have discussed the procedure, including indications, risks of general anesthesia, bleeding, infection, recurrent wound infection, and intestinal fistula.  She is now brought to the operating room for this procedure.  DESCRIPTION OF OPERATION:  The patient was brought to the operating room, placed in supine position on the operating table, and general endotracheal anesthesia was induced.  Foley catheter was placed.  The abdomen was widely sterilely prepped and draped.  There was a fistulous tract to the low midline with a slight amount of purulent  drainage.  I performed a vertical elliptical incision, encompassing this tract, with the incision being about 5 cm in length.  I excised skin and subcutaneous tissue, surrounding the tract down to the level of the fascia, at  which point I came across the tissue and it was removed. There was an opening in the fascia less than a centimeter in diameter with some chronic granulation tissue and slight purulent material coming from beneath the fascia.  I incised the fascia for about a centimeter superiorly and inferiorly and entered a pocket that was about 2 cm in diameter, containing again some chronic granulation tissue, slight purulence and some obviously unincorporated polypropylene mesh.  Using careful blunt and scissor dissection, I trimmed back any obviously unincorporated mesh toward the side superiorly, inferiorly, and toward the base of the wound.  There appeared to be scar in all direction.  I did not see any evidence of an intestinal involvement.  The mesh was carefully debrided back in all directions until I did not see any evidence of unincorporated mesh in any direction.  I did not try to remove all the mesh as I discussed with her preoperatively just trying to get this back to incorporated mesh with some chance of wound healing. At the end of the procedure, there was a subfascial about 2-3 cm cavity that all appeared healthy and  no obviously exposed mesh.  The soft tissue was thoroughly irrigated and then infiltrated with Marcaine with epinephrine.  The wound was packed with moist saline Kerlix.  Dry sterile dressing was applied.  She was taken to Recovery in good condition.     [name]. [name], M.D.     [name]  D:  10/11/2010  T:  10/12/2010  Job:  [id number]  Electronically Signed by [name] M.D. on 10/21/2010 05:52:22 PM

## 2010-11-04 ENCOUNTER — Other Ambulatory Visit (INDEPENDENT_AMBULATORY_CARE_PROVIDER_SITE_OTHER): Payer: Self-pay

## 2010-11-04 DIAGNOSIS — G8918 Other acute postprocedural pain: Secondary | ICD-10-CM

## 2010-11-04 DIAGNOSIS — R1084 Generalized abdominal pain: Secondary | ICD-10-CM

## 2010-11-04 MED ORDER — HYDROCODONE-ACETAMINOPHEN 5-500 MG PO TABS: ORAL_TABLET | ORAL | Status: DC

## 2010-11-09 LAB — BASIC METABOLIC PANEL
Calcium: 9.1
GFR calc Af Amer: >60
GFR calc non Af Amer: >60
Glucose, Bld: 89
Potassium: 3.4 — ABNORMAL LOW
Sodium: 140

## 2010-11-09 LAB — DIFFERENTIAL
Basophils Absolute: 0.1
Lymphocytes Relative: 36
Lymphs Abs: 3.2
Monocytes Absolute: 0.5
Neutro Abs: 5

## 2010-11-09 LAB — POCT CARDIAC MARKERS
Myoglobin, poc: 52.9
Operator id: 161631

## 2010-11-09 LAB — CBC
Hemoglobin: 15.1 — ABNORMAL HIGH
RBC: 4.89
RDW: 13.4
WBC: 9

## 2010-11-09 LAB — POCT I-STAT 3, ART BLOOD GAS (G3+)
TCO2: 29
pCO2 arterial: 39.3
pH, Arterial: 7.455 — ABNORMAL HIGH
pO2, Arterial: 63 — ABNORMAL LOW

## 2010-11-10 ENCOUNTER — Ambulatory Visit (INDEPENDENT_AMBULATORY_CARE_PROVIDER_SITE_OTHER): Payer: Medicaid Other | Admitting: General Surgery

## 2010-11-10 VITALS — BP 128/84 | HR 72 | Temp 97.4°F | Resp 18 | Wt 202.0 lb

## 2010-11-10 DIAGNOSIS — Z9889 Other specified postprocedural states: Secondary | ICD-10-CM

## 2010-11-10 NOTE — Progress Notes (Signed)
Patient returns 4 weeks following debridement of her low midline incision for infected mesh status post colectomy. She reports it is doing well and she is feeling better. She is packing the wound herself and the home health nurse is coming intermittently.  On exam she appears in good spirits. The wound has closed in significantly measuring less than a centimeter in diameter and probably about 2 cm deep. It all appears to be healthy granulation with no purulent drainage.  Assessment and plan: Doing well following wound debridement. Continue current wound care and I will see her back in 4 weeks.

## 2010-11-10 NOTE — Patient Instructions (Signed)
Cough or concerns or questions

## 2010-11-14 NOTE — Discharge Summary (Signed)
  [name], [name] NO.:  [id number]  MEDICAL RECORD NO.:  [id number]  LOCATION:  1533                         FACILITY:  [facility]  PHYSICIAN:  Sharlet Salina T. Jo Booze, M.D.DATE OF BIRTH:  11-May-1955  DATE OF ADMISSION:  10/11/2010 DATE OF DISCHARGE:  10/12/2010                              DISCHARGE SUMMARY   DISCHARGE DIAGNOSIS:  Infected mesh, abdominal wall.  SURGICAL PROCEDURE:  Debridement of infected mesh, abdominal wall, October 11, 2010.  HISTORY OF PRESENT ILLNESS:  This patient is a 55 year old female status post colectomy for diverticulitis and colovaginal fistula number of months ago.  She developed a chronic draining sinus tract to her low midline wound postoperatively.  She had had previous mesh repairs of her abdominal wall.  Probing the wound in the office, there appears to be some unincorporated mesh in the lower part of her incision responsible for the chronic drainage.  After discussion of options, we have elected to proceed with debridement of her wound and abdominal wall in an effort to get this healed and she is admitted for this procedure.  PAST MEDICAL HISTORY:  Surgery significant as above plus hysterectomy and ventral hernia repairs.  Medically, she is followed for anxiety, depression, and asthma.  HOSPITAL COURSE:  The patient came on the morning of her procedure, underwent a debridement of her wound with about a 5 cm incision, debriding several square centimeters of unincorporated mesh from the level of the fascia back to healthy tissue and incorporated mesh.  We will treat the wound open.  The following morning, she was comfortable. Wound care was explained, which she was comfortable with and she is discharged home.  MEDICATIONS:  Same as admission plus Vicodin for pain.  FOLLOWUP:  My office in 1 week.     Lorne Skeens. Micah Barnier, M.D.     Tory Emerald  D:  10/25/2010  T:  10/25/2010  Job:  [id number]  Electronically Signed by  Glenna Fellows M.D. on 11/14/2010 01:14:10 PM

## 2010-12-14 ENCOUNTER — Encounter (INDEPENDENT_AMBULATORY_CARE_PROVIDER_SITE_OTHER): Payer: Medicaid Other | Admitting: General Surgery

## 2010-12-21 ENCOUNTER — Encounter (INDEPENDENT_AMBULATORY_CARE_PROVIDER_SITE_OTHER): Payer: Self-pay | Admitting: General Surgery

## 2010-12-21 ENCOUNTER — Ambulatory Visit (INDEPENDENT_AMBULATORY_CARE_PROVIDER_SITE_OTHER): Payer: Medicaid Other | Admitting: General Surgery

## 2010-12-21 VITALS — BP 142/84 | HR 72 | Temp 98.0°F | Resp 18 | Ht 66.0 in | Wt 268.0 lb

## 2010-12-21 DIAGNOSIS — Z5189 Encounter for other specified aftercare: Secondary | ICD-10-CM

## 2010-12-21 DIAGNOSIS — Z4889 Encounter for other specified surgical aftercare: Secondary | ICD-10-CM

## 2010-12-21 NOTE — Progress Notes (Signed)
Subjective:     Patient ID: [name], female   DOB: 1955-03-09, 55 y.o.   MRN: [id number]  HPI This patient is approximately one month status post colectomy and has had a complicated postoperative course. Recently, she has been improving but noticed 2 days ago a lump with some tenderness under her lower midline wound. She has a partially open wound and has a minimal amount of drainage from this. She has some tenderness but no fevers or chills.  Review of Systems     Objective:   Physical Exam No acute distress and nontoxic-appearing  Her incision is healing well. She does have a 1 cm area of granulation tissue at the lower aspect of her wound but no evidence of purulence or drainage. Just to the left of her incision she does have a palpable area of induration or "knot". There is no fluctuance. High frequency ultrasound was performed at the bedside which did not reveal any fluid collection or evidence of hernia.    Assessment:     Status post colectomy. I'm not sure what this knot is that she is feeling but it does not appear to have any fluctuance or abscess. I do not think that this is a hernia or abscess. I am really not sure what is causing this. I recommended that she continue to keep an eye on this and called back in another day or 2 if symptoms increase. Otherwise she is artery scheduled to follow up next week with Dr. Johna Sheriff and this can be reevaluated at that time.    Plan:

## 2010-12-23 ENCOUNTER — Ambulatory Visit (INDEPENDENT_AMBULATORY_CARE_PROVIDER_SITE_OTHER): Payer: Medicaid Other | Admitting: General Surgery

## 2010-12-23 ENCOUNTER — Encounter (INDEPENDENT_AMBULATORY_CARE_PROVIDER_SITE_OTHER): Payer: Self-pay | Admitting: General Surgery

## 2010-12-23 ENCOUNTER — Other Ambulatory Visit (INDEPENDENT_AMBULATORY_CARE_PROVIDER_SITE_OTHER): Payer: Self-pay | Admitting: General Surgery

## 2010-12-23 VITALS — BP 158/96 | HR 60 | Temp 97.5°F | Resp 18 | Ht 65.0 in | Wt 217.5 lb

## 2010-12-23 DIAGNOSIS — T8140XA Infection following a procedure, unspecified, initial encounter: Secondary | ICD-10-CM

## 2010-12-23 DIAGNOSIS — T8149XA Infection following a procedure, other surgical site, initial encounter: Secondary | ICD-10-CM

## 2010-12-23 NOTE — Progress Notes (Signed)
History: Patient is several months following abdominal wall debridement and removal of infected mesh and 4 chronic mesh infection following colectomy. She had been doing well but now has 4 days of pain and swelling just to the left of her incision which had just about healed.  On examination just to the left of the old wound where there was a tiny bit of granulation tissue is a 3 cm area of tenderness swelling and fluctuance.  Assessment and plan: Wound abscess. The local anesthesia I unroofed this area and drained a moderate amount of pus which was cultured. I excised skin over to the midline granulation and there was a pocket underneath this measuring about an inch in diameter an inch deep. I did not see any mesh or necrotic tissue at the base. This was cleaned and packed with moist saline gauze and she does have to do this at home. We gave her prescription for Cipro and Flagyl for 10 days. She will return in 2-3 weeks.

## 2010-12-23 NOTE — Patient Instructions (Signed)
Remove packing Sunday then packed the wound with moist saline gauze daily. Complete your antibiotics.

## 2010-12-23 NOTE — Progress Notes (Signed)
Addended by: Maryan Puls on: 12/23/2010 10:33 AM   Modules accepted: Orders

## 2010-12-27 ENCOUNTER — Other Ambulatory Visit (INDEPENDENT_AMBULATORY_CARE_PROVIDER_SITE_OTHER): Payer: Self-pay

## 2010-12-27 DIAGNOSIS — Z22322 Carrier or suspected carrier of Methicillin resistant Staphylococcus aureus: Secondary | ICD-10-CM

## 2010-12-27 LAB — CULTURE, ROUTINE-ABSCESS: Gram Stain: NONE SEEN

## 2010-12-27 MED ORDER — SULFAMETHOXAZOLE-TRIMETHOPRIM 800-160 MG PO TABS: 1.0000 | ORAL_TABLET | Freq: Two times a day (BID) | ORAL | Status: AC

## 2010-12-29 ENCOUNTER — Encounter (INDEPENDENT_AMBULATORY_CARE_PROVIDER_SITE_OTHER): Payer: Medicaid Other | Admitting: General Surgery

## 2011-01-03 ENCOUNTER — Encounter (INDEPENDENT_AMBULATORY_CARE_PROVIDER_SITE_OTHER): Payer: Medicaid Other

## 2011-01-20 ENCOUNTER — Ambulatory Visit (INDEPENDENT_AMBULATORY_CARE_PROVIDER_SITE_OTHER): Payer: Medicaid Other | Admitting: General Surgery

## 2011-01-20 ENCOUNTER — Encounter (INDEPENDENT_AMBULATORY_CARE_PROVIDER_SITE_OTHER): Payer: Self-pay | Admitting: General Surgery

## 2011-01-20 DIAGNOSIS — T8140XA Infection following a procedure, unspecified, initial encounter: Secondary | ICD-10-CM

## 2011-01-20 DIAGNOSIS — K432 Incisional hernia without obstruction or gangrene: Secondary | ICD-10-CM | POA: Insufficient documentation

## 2011-01-20 DIAGNOSIS — T8149XA Infection following a procedure, other surgical site, initial encounter: Secondary | ICD-10-CM

## 2011-01-20 NOTE — Patient Instructions (Signed)
Call as needed for concerns and otherwise we will see you in 6 months.

## 2011-01-20 NOTE — Progress Notes (Signed)
Patient returns for followup after debridement of infected mesh from her low midline wound. She happily reports is not draining were giving her any discomfort. She does have a known long-standing periumbilical incisional hernia which continues to give her occasional discomfort.  On examination, happily, her low midline wound is completely healed with no evidence of residual infection. She does have a small to moderate periumbilical incisional hernia that is reducible and minimally tender.  Assessment and plan: Doing well with healed wound following debridement. Her incisional hernia symptomatic and would likely want to fix this at some point but I would certainly want to wait at least 6 months after her recent infection. She is to return at that time for reevaluation and will call us sooner as needed

## 2011-02-17 ENCOUNTER — Encounter (INDEPENDENT_AMBULATORY_CARE_PROVIDER_SITE_OTHER): Payer: Medicaid Other | Admitting: General Surgery

## 2011-02-21 ENCOUNTER — Encounter (INDEPENDENT_AMBULATORY_CARE_PROVIDER_SITE_OTHER): Payer: Medicaid Other | Admitting: General Surgery

## 2011-02-23 ENCOUNTER — Ambulatory Visit (INDEPENDENT_AMBULATORY_CARE_PROVIDER_SITE_OTHER): Payer: Medicaid Other | Admitting: General Surgery

## 2011-02-23 ENCOUNTER — Encounter (INDEPENDENT_AMBULATORY_CARE_PROVIDER_SITE_OTHER): Payer: Self-pay | Admitting: General Surgery

## 2011-02-23 VITALS — BP 162/108 | HR 104 | Temp 96.9°F | Resp 26 | Ht 64.0 in | Wt 213.6 lb

## 2011-02-23 DIAGNOSIS — T8149XA Infection following a procedure, other surgical site, initial encounter: Secondary | ICD-10-CM

## 2011-02-23 DIAGNOSIS — T8140XA Infection following a procedure, unspecified, initial encounter: Secondary | ICD-10-CM

## 2011-02-23 MED ORDER — SULFAMETHOXAZOLE-TRIMETHOPRIM 800-160 MG PO TABS: 1.0000 | ORAL_TABLET | Freq: Two times a day (BID) | ORAL | Status: AC

## 2011-02-23 NOTE — Progress Notes (Signed)
History: Patient returns to the office after calling to say that she developed some redness and drainage again in her lower midline wound. 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 A number of months ago we underwent debridement of her lower bowel the wound with removal of infected mesh and it appeared healed completely but now has reopened. She feels generally poorly but no fever or chills.  Exam: Gen.: She again appears somewhat depressed but not acutely ill Abdomen: There is a draining sinus tract in her lower midline wound. I probed this with a cotton-tipped applicator it appears to go down toward the fascia about 4 cm. Minimal purulent drainage. No erythema or swelling.  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 For now we will try to treat this conservatively to see how it responds. I'm giving her a prescription for Bactrim b.i.d. for one week. She'll pack. With new gauze daily. Return in one week. Her blood pressure and heart rate are up in the office today. I don't think this is due to infection. I asked her to contact her primary physician today for treatment.

## 2011-03-02 ENCOUNTER — Ambulatory Visit (INDEPENDENT_AMBULATORY_CARE_PROVIDER_SITE_OTHER): Payer: Medicaid Other | Admitting: General Surgery

## 2011-03-02 VITALS — BP 140/80 | HR 100 | Temp 97.4°F | Resp 20 | Ht 64.0 in | Wt 213.0 lb

## 2011-03-02 DIAGNOSIS — T8149XA Infection following a procedure, other surgical site, initial encounter: Secondary | ICD-10-CM

## 2011-03-02 DIAGNOSIS — T8140XA Infection following a procedure, unspecified, initial encounter: Secondary | ICD-10-CM

## 2011-03-02 NOTE — Progress Notes (Signed)
Patient returns after I&D of her low midline wound last week. She is feeling much better although the antibiotics seem to be causing some stomach upset of heartburn  On examination the wound is very clean and I cannot probe any deeper than about half a centimeter. There is no drainage. No erythema.  Assessment and plan: Wound infection with history of infected mesh. The wound once good today. We will stop antibiotics. She will wash in the shower daily and cover the wound. I will see her back in 3 weeks.

## 2011-03-02 NOTE — Patient Instructions (Signed)
Stop antibiotics. Wash in shower daily and cover with clean gauze

## 2011-03-17 ENCOUNTER — Other Ambulatory Visit (INDEPENDENT_AMBULATORY_CARE_PROVIDER_SITE_OTHER): Payer: Self-pay

## 2011-03-17 ENCOUNTER — Telehealth (INDEPENDENT_AMBULATORY_CARE_PROVIDER_SITE_OTHER): Payer: Self-pay

## 2011-03-17 DIAGNOSIS — O909 Complication of the puerperium, unspecified: Secondary | ICD-10-CM

## 2011-03-17 MED ORDER — SULFAMETHOXAZOLE-TMP DS 800-160 MG PO TABS: 1.0000 | ORAL_TABLET | Freq: Two times a day (BID) | ORAL | Status: AC

## 2011-03-17 NOTE — Telephone Encounter (Signed)
[name] called stating her abd wound has opened (dime size) and has been draining.  Offered an urgent office appointment, patient only wanted to see Dr. Johna Sheriff.  Paged and reviewed with Dr. Johna Sheriff, ordered Bactrim DS, 1 po bid, #14, 0 refills, called in to [facility] [address]  Advised [name] to keep area clean and dry, cover with gauze due to wound draining.  Patient has follow up appointment on 03/24/11.

## 2011-03-24 ENCOUNTER — Ambulatory Visit (INDEPENDENT_AMBULATORY_CARE_PROVIDER_SITE_OTHER): Payer: Medicaid Other | Admitting: General Surgery

## 2011-03-24 ENCOUNTER — Encounter (INDEPENDENT_AMBULATORY_CARE_PROVIDER_SITE_OTHER): Payer: Self-pay | Admitting: General Surgery

## 2011-03-24 VITALS — BP 146/92 | HR 123 | Temp 95.1°F | Ht 64.0 in | Wt 211.6 lb

## 2011-03-24 DIAGNOSIS — T8140XA Infection following a procedure, unspecified, initial encounter: Secondary | ICD-10-CM | POA: Insufficient documentation

## 2011-03-24 NOTE — Progress Notes (Signed)
History: Patient returns for followup of her wound infection with history of infected mesh post colectomy and required debridement. She then developed a recurring draining sinus. She says it feels about the same without any pain or unusual drainage and seems a little smaller.  Exam: There is approximately 3 or 4 mm opening it appears clean and on probing with an applicator goes down about 2 cm. No purulent drainage or erythema.  Assessment plan: One sinus with history of infected mesh. She is completing her antibiotics. I see no signs of active infection. We discussed that healing is problematic with the mesh involved but I would continue local wound care and observe for now. Return in 3 weeks.

## 2011-04-14 ENCOUNTER — Encounter (INDEPENDENT_AMBULATORY_CARE_PROVIDER_SITE_OTHER): Payer: Medicaid Other | Admitting: General Surgery

## 2011-05-08 ENCOUNTER — Other Ambulatory Visit: Payer: Self-pay

## 2011-05-08 ENCOUNTER — Emergency Department (HOSPITAL_COMMUNITY)
Admission: EM | Admit: 2011-05-08 | Discharge: 2011-05-08 | Disposition: A | Discharge status: Home / Self Care | Payer: Medicaid Other | Attending: Emergency Medicine | Admitting: Emergency Medicine

## 2011-05-08 ENCOUNTER — Emergency Department (HOSPITAL_COMMUNITY): Payer: Medicaid Other

## 2011-05-08 ENCOUNTER — Encounter (HOSPITAL_COMMUNITY): Payer: Self-pay | Admitting: *Deleted

## 2011-05-08 DIAGNOSIS — R51 Headache: Secondary | ICD-10-CM | POA: Insufficient documentation

## 2011-05-08 DIAGNOSIS — G510 Bell's palsy: Secondary | ICD-10-CM

## 2011-05-08 DIAGNOSIS — F3289 Other specified depressive episodes: Secondary | ICD-10-CM | POA: Insufficient documentation

## 2011-05-08 DIAGNOSIS — Z79899 Other long term (current) drug therapy: Secondary | ICD-10-CM | POA: Insufficient documentation

## 2011-05-08 DIAGNOSIS — F988 Other specified behavioral and emotional disorders with onset usually occurring in childhood and adolescence: Secondary | ICD-10-CM | POA: Insufficient documentation

## 2011-05-08 DIAGNOSIS — Z8739 Personal history of other diseases of the musculoskeletal system and connective tissue: Secondary | ICD-10-CM | POA: Insufficient documentation

## 2011-05-08 DIAGNOSIS — F17200 Nicotine dependence, unspecified, uncomplicated: Secondary | ICD-10-CM | POA: Insufficient documentation

## 2011-05-08 DIAGNOSIS — F329 Major depressive disorder, single episode, unspecified: Secondary | ICD-10-CM | POA: Insufficient documentation

## 2011-05-08 LAB — BASIC METABOLIC PANEL
Chloride: 97 mEq/L (ref 96–112)
GFR calc Af Amer: >90 mL/min (ref >90)
GFR calc non Af Amer: >90 mL/min (ref >90)
Glucose, Bld: 179 mg/dL — ABNORMAL HIGH (ref 70–99)
Potassium: 3.5 mEq/L (ref 3.5–5.1)
Sodium: 137 mEq/L (ref 135–145)

## 2011-05-08 MED ORDER — OXYCODONE-ACETAMINOPHEN 5-325 MG PO TABS
1.0000 | ORAL_TABLET | Freq: Once | ORAL | Status: AC
  Administered 2011-05-08: 1 via ORAL
  Filled 2011-05-08: qty 1

## 2011-05-08 MED ORDER — METHYLPREDNISOLONE SODIUM SUCC 125 MG IJ SOLR
125.0000 mg | Freq: Once | INTRAMUSCULAR | Status: AC
  Administered 2011-05-08: 125 mg via INTRAVENOUS
  Filled 2011-05-08: qty 2

## 2011-05-08 MED ORDER — PREDNISONE 20 MG PO TABS: 60.0000 mg | ORAL_TABLET | Freq: Every day | ORAL | Status: AC

## 2011-05-08 MED ORDER — SODIUM CHLORIDE 0.9 % IV SOLN
INTRAVENOUS | Status: DC
  Administered 2011-05-08: 15:00:00 via INTRAVENOUS

## 2011-05-08 MED ORDER — VALACYCLOVIR HCL 500 MG PO TABS: 500.0000 mg | ORAL_TABLET | Freq: Two times a day (BID) | ORAL | Status: AC

## 2011-05-08 NOTE — ED Notes (Signed)
Pt awoke Sunday morning at 4am with L sided facial droop, grip weaker in L arm, L leg, L arm drift. Occipital HA since Friday. "didn't think anything of it, was going to wait until Monday to see my doctor."

## 2011-05-08 NOTE — Discharge Instructions (Signed)
Your CAT scan does not show a stroke.  Use prednisone and Valtrex to treat Bell's palsy.  Use Lacri-Lube in your left eye to keep your cornea, moist throughout the day, and before going to sleep.  Followup with your Dr. for reevaluation.  Return for worse or uncontrolled symptoms.  Stop smoking.  Cigarettes

## 2011-05-08 NOTE — ED Provider Notes (Signed)
History     CSN: [id number]  Arrival date & time 05/08/11  1352   First MD Initiated Contact with Patient 05/08/11 1419      Chief Complaint  Patient presents with  . Stroke Symptoms    (Consider location/radiation/quality/duration/timing/severity/associated sxs/prior treatment) The history is provided by the patient.   the patient is a 56 year old, female , who smokes cigarettes, who presents to emergency department complaining of a headache, and neck pain.  Since last 5 days ago, along with left facial weakness and inability to close her left eye that began 2 days ago, when she woke up.  She also complains of numbness of the left side of her tongue. She denies nausea, vomiting.  She has not had a fever, or rash.  She has never had a stroke or heart attack in the past.  She denies hypertension, and diabetes.  She is not dizzy, and it is not having any difficulty with her ambulation.  She is left-hand dominant and does not have weakness.  When she eats or drinks using her left hand Past Medical History  Diagnosis Date  . Depression   . Arthritis   . 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 ADD (attention deficit disorder with hyperactivity)     Past Surgical History  Procedure Date  . Abdominal hysterectomy   . Spine surgery   . Vulvar excision 2006    Cancer in situ  . Oophorectomy 2006    cervical cancer  . Hernia repair     incisional/ventral hernia repair  . Rectovaginal fistula closure 2012  . Colectomy     Family History  Problem Relation Age of Onset  . COPD Father   . Cancer Father     lung  . Cancer Paternal Aunt     lung    History  Substance Use Topics  . Smoking status: Current Everyday Smoker -- 0.5 packs/day  . Smokeless tobacco: Never Used  . Alcohol Use: No    OB History    Grav Para Term Preterm Abortions TAB SAB Ect Mult Living                  Review of Systems  HENT: Positive for neck pain.   Eyes: Negative for visual disturbance.    Respiratory: Negative for chest tightness.   Cardiovascular: Negative for chest pain.  Gastrointestinal: Negative for nausea and vomiting.  Skin: Negative for rash.  Neurological: Positive for headaches.  Psychiatric/Behavioral: Negative for confusion.  All other systems reviewed and are negative.    Allergies  Doxycycline; Erythromycin; and Penicillins  Home Medications   Current Outpatient Rx  Name Route Sig Dispense Refill  . ALPRAZOLAM 2 MG PO TABS Oral Take 1 mg by mouth 4 (four) times daily. Patient takes 1/2 tablet by mouth four times a day    . ARIPIPRAZOLE 5 MG PO TABS Oral Take 15 mg by mouth daily.     . ATOMOXETINE HCL 40 MG PO CAPS Oral Take 40 mg by mouth daily.    Marland Kitchen FLUOXETINE HCL 20 MG PO CAPS Oral Take 20 mg by mouth daily.    Marland Kitchen HYDROCODONE-ACETAMINOPHEN 10-650 MG PO TABS Oral Take 1 tablet by mouth every 6 (six) hours as needed. For pain     . METHYLPHENIDATE HCL 10 MG PO TABS Oral Take 10 mg by mouth daily.    . MULTIVITAMIN PO Oral Take 1 tablet by mouth daily.     Marland Kitchen NORTRIPTYLINE HCL 25 MG PO CAPS  Oral Take 25 mg by mouth 3 (three) times daily.    Marland Kitchen TEMAZEPAM 15 MG PO CAPS Oral Take 15 mg by mouth at bedtime.    . VENLAFAXINE HCL 75 MG PO TABS Oral Take 75 mg by mouth daily.    Marland Kitchen VITAMIN B-12 250 MCG PO TABS Oral Take 250 mcg by mouth daily.      Marland Kitchen VITAMIN D (ERGOCALCIFEROL) 50000 UNITS PO CAPS Oral Take 50,000 Units by mouth every 7 (seven) days.        BP 136/84  Pulse 86  Temp(Src) 98.1 F (36.7 C) (Oral)  Resp 23  SpO2 98%  Physical Exam  Vitals reviewed. Constitutional: She is oriented to person, place, and time.       Obese  HENT:  Head: Normocephalic and atraumatic.  Eyes: Conjunctivae and EOM are normal.  Neck: Normal range of motion. Neck supple.       No carotid bruit  Cardiovascular: Normal rate.   No murmur heard. Pulmonary/Chest: Effort normal. No respiratory distress.  Abdominal: Soft. There is no tenderness.  Musculoskeletal:  Normal range of motion. She exhibits no edema.  Neurological: She is alert and oriented to person, place, and time. A cranial nerve deficit is present.       The patient is unable to raise her eyebrow wrinkle her forehead on the left side.  She has incomplete closure of her left eye lids  5/5 muscle strength in all 4 extremities  Skin: Skin is warm and dry. No rash noted.  Psychiatric: She has a normal mood and affect. Thought content normal.    ED Course  Procedures (including critical care time) 56 year old, female, smoker presents with classic Bell's palsy on examination.  However, she has a headache, and neck pain, as well, and she smokes cigarettes.  Therefore, we will perform a CAT scan of her head.  However, I anticipate, that we'll be negative, and we will treat her Bell's palsy whiskey, relates, and antiviral medication   Labs Reviewed  BASIC METABOLIC PANEL   No results found.   No diagnosis found.    MDM  Bell's palsy        Cheri Guppy, MD 05/08/11 1555

## 2011-05-26 ENCOUNTER — Encounter (INDEPENDENT_AMBULATORY_CARE_PROVIDER_SITE_OTHER): Payer: Medicaid Other | Admitting: General Surgery

## 2011-05-26 ENCOUNTER — Encounter (INDEPENDENT_AMBULATORY_CARE_PROVIDER_SITE_OTHER): Payer: Self-pay | Admitting: General Surgery

## 2011-05-26 ENCOUNTER — Ambulatory Visit (INDEPENDENT_AMBULATORY_CARE_PROVIDER_SITE_OTHER): Payer: Medicaid Other | Admitting: General Surgery

## 2011-05-26 VITALS — BP 132/84 | HR 70 | Temp 97.2°F | Resp 16 | Ht 63.0 in | Wt 217.5 lb

## 2011-05-26 DIAGNOSIS — T8140XA Infection following a procedure, unspecified, initial encounter: Secondary | ICD-10-CM

## 2011-05-26 DIAGNOSIS — T8149XA Infection following a procedure, other surgical site, initial encounter: Secondary | ICD-10-CM

## 2011-05-26 NOTE — Progress Notes (Signed)
Patient returns for long-term followup with history of infected mesh in her abdominal wall status post debridement. She has healed down to a chronic draining sinus tract which we've been following. She states it still drains a little bit and stings occasionally.  On examination is a clean appearing to 3 mm opening that tracks down about a centimeter which seems less deep than it did. There is no purulent drainage.  Assessment and plan: Chronic wound infection post mesh removal. There certainly could be some infected mesh at the base of the wound. She however is minimally symptomatic with a tiny wound and we discussed that repeat surgery could be extensive and would have some risk of repeat infection and small risks of more major complication such as intestinal injury. As she is minimally symptomatic we are going to discontinue wound care and close observation for now she will return in 2 months.

## 2011-08-10 ENCOUNTER — Encounter (INDEPENDENT_AMBULATORY_CARE_PROVIDER_SITE_OTHER): Payer: Medicaid Other | Admitting: General Surgery

## 2011-08-15 ENCOUNTER — Ambulatory Visit (INDEPENDENT_AMBULATORY_CARE_PROVIDER_SITE_OTHER): Payer: Medicaid Other | Admitting: General Surgery

## 2011-08-15 VITALS — BP 142/88 | HR 71 | Temp 97.1°F | Resp 16 | Ht 63.0 in | Wt 220.6 lb

## 2011-08-15 DIAGNOSIS — T8149XA Infection following a procedure, other surgical site, initial encounter: Secondary | ICD-10-CM

## 2011-08-15 DIAGNOSIS — T8140XA Infection following a procedure, unspecified, initial encounter: Secondary | ICD-10-CM

## 2011-08-15 NOTE — Progress Notes (Signed)
History: Patient returns for followup for chronic draining wound sinus with history of previous debridement of infected mesh in her lower abdominal wall. She reports it is no better or no worse.  On exam there is a persistent draining sinus tract which with probing with a Q-tip progressively does track deeply and inferiorly down toward the pubis and the abdominal wall. I repacked this with our form gauze which will remove tomorrow and begin dressing changes.  I discussed with her that I don't think is going to heal as were appears to be residual infected mesh in her abdominal wall. We discussed options of continued observation versus wound exploration and debridement. I discussed with her that this was certainly an obviously not guaranteed to work if we reoperated in we could conceivably cause worse problem such as intestinal injury. However I don't think this area going to heal and if she would like an attempt to debride the area this is reasonable. She feels she would at this time. I told her to think this over and I will see her back in 2 months and if we don't have progress at that time we will schedule her for wound debridement.

## 2011-08-15 NOTE — Patient Instructions (Signed)
Removal gauze packing tomorrow then begin again daily dressing changes.

## 2011-09-13 ENCOUNTER — Other Ambulatory Visit: Payer: Self-pay | Admitting: Dermatology

## 2011-10-20 ENCOUNTER — Encounter (INDEPENDENT_AMBULATORY_CARE_PROVIDER_SITE_OTHER): Payer: Self-pay | Admitting: General Surgery

## 2011-10-20 ENCOUNTER — Ambulatory Visit (INDEPENDENT_AMBULATORY_CARE_PROVIDER_SITE_OTHER): Payer: Medicaid Other | Admitting: General Surgery

## 2011-10-20 VITALS — BP 132/86 | HR 95 | Temp 97.8°F | Resp 20 | Ht 65.0 in | Wt 217.8 lb

## 2011-10-20 DIAGNOSIS — T8140XA Infection following a procedure, unspecified, initial encounter: Secondary | ICD-10-CM

## 2011-10-20 DIAGNOSIS — L989 Disorder of the skin and subcutaneous tissue, unspecified: Secondary | ICD-10-CM | POA: Insufficient documentation

## 2011-10-20 DIAGNOSIS — T8149XA Infection following a procedure, other surgical site, initial encounter: Secondary | ICD-10-CM

## 2011-10-20 NOTE — Progress Notes (Signed)
Subjective:   nonhealing wound of abdominal wall Skin lesion right upper extremity  Patient ID: [name], female   DOB: 1955-09-09, 56 y.o.   MRN: [id number]  HPI Patient returned to the office for followup of her chronic wound of her lower midline incision. She developed a wound infection following colectomy over one year ago. She had previous mesh in her lower abdominal wall from remote hernia repairs. About one year ago we debrided her abdominal wall and removed mesh but this is healed antral chronic draining site which has not progressed. She has not noted any difference from her last visit. She does point out today that she's had an enlarging skin lesion on her right wrist that has been present for 4 months. She has no generalized abdominal pain. No fever or chills. Past Medical History  Diagnosis Date  . Depression   . Arthritis   . 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 ADD (attention deficit disorder with hyperactivity)    Past Surgical History  Procedure Date  . Abdominal hysterectomy   . Spine surgery   . Vulvar excision 2006    Cancer in situ  . Oophorectomy 2006    cervical cancer  . Hernia repair     incisional/ventral hernia repair  . Rectovaginal fistula closure 2012  . Colectomy    Current Outpatient Prescriptions  Medication Sig Dispense Refill  . alprazolam (XANAX) 2 MG tablet Take 1 mg by mouth 4 (four) times daily. Patient takes 1/2 tablet by mouth four times a day      . ARIPiprazole (ABILIFY) 5 MG tablet Take 10 mg by mouth daily.       Marland Kitchen atomoxetine (STRATTERA) 40 MG capsule Take 100 mg by mouth daily.       Marland Kitchen FLUoxetine (PROZAC) 20 MG capsule Take 20 mg by mouth daily.      Marland Kitchen HYDROcodone-acetaminophen (LORCET) 10-650 MG per tablet Take 1 tablet by mouth every 6 (six) hours as needed. For pain       . lithium carbonate (LITHOBID) 300 MG CR tablet       . methylphenidate (RITALIN) 10 MG tablet Take 10 mg by mouth daily.      . Multiple Vitamin (MULTIVITAMIN  PO) Take 1 tablet by mouth daily.       . nortriptyline (PAMELOR) 25 MG capsule Take 25 mg by mouth 3 (three) times daily.      . temazepam (RESTORIL) 15 MG capsule Take 15 mg by mouth at bedtime.      . valACYclovir (VALTREX) 500 MG tablet Take 1 tablet (500 mg total) by mouth 2 (two) times daily.  10 tablet  0  . venlafaxine (EFFEXOR) 75 MG tablet Take 75 mg by mouth daily.      . vitamin B-12 (CYANOCOBALAMIN) 250 MCG tablet Take 250 mcg by mouth daily.        . Vitamin D, Ergocalciferol, (DRISDOL) 50000 UNITS CAPS Take 50,000 Units by mouth every 7 (seven) days.        Marland Kitchen DISCONTD: amphetamine-dextroamphetamine (ADDERALL) 5 MG tablet Take 5 mg by mouth daily.        Marland Kitchen DISCONTD: clomiPRAMINE (ANAFRANIL) 25 MG capsule Take 25 mg by mouth at bedtime.        Allergies  Allergen Reactions  . Penicillins Itching and Rash    All over the body.  . Doxycycline   . Erythromycin    History  Substance Use Topics  . Smoking status: Current Everyday Smoker --  0.5 packs/day  . Smokeless tobacco: Never Used  . Alcohol Use: No     Review of Systems  Constitutional: Negative for fever and chills.  HENT: Positive for congestion.   Respiratory: Positive for cough, shortness of breath and wheezing.   Cardiovascular: Negative.   Gastrointestinal: Positive for diarrhea. Negative for nausea, vomiting, abdominal pain, constipation, blood in stool and abdominal distention.  Musculoskeletal: Positive for arthralgias.  Psychiatric/Behavioral: Positive for dysphoric mood.       Objective:   Physical Exam BP 132/86  Pulse 95  Temp 97.8 F (36.6 C) (Temporal)  Resp 20  Ht 5\' 5"  (1.651 m)  Wt 217 lb 12.8 oz (98.793 kg)  BMI 36.24 kg/m2  SpO2 94% General: Moderately obese Caucasian female no acute distress Skin: Along the ulnar aspect of the right forearm just above the wrist is a 12 mm raised umbilicated irregular skin lesion. No other areas of concern Lymph nodes: No palpable cervical,  supraclavicular, or axillary nodes palpable. No epitrochlear nodes palpable in the right upper extremity Lungs: Mild bilateral expiratory wheezing. No increased work of breathing Cardiovascular: Regular rate and rhythm. No edema Abdomen: There is a persistent draining sinus tract with some purulent drainage in the low midline. I cannot get this to track more than about a centimeter. Periumbilical incisional hernia stable. Generally soft and nontender. Extremities: See above Neurologic: Alert and fully oriented. Gait normal.    Assessment:     None healing abdominal wound likely chronically infected mesh at the abdominal wall. We have given this every effort to heal nonsurgically. I discussed options with the patient including continued wound management versus further wound exploration and debridement. I don't believe this has any realistic open healing without surgical intervention. She would prefer to try to get this healed. We discussed the nature of the procedure and risks of recurrent infection, bleeding and slight risk of intestinal injury. We discussed that we would need to get her bronchitis resolved before going ahead with surgery and that if at all possible she needs to quit cigarettes as this would interfere with breathing postoperatively and with wound healing.  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 I discussed this with her and she is in agreement.    Plan:     Abdominal wound exploration with debridement under general anesthesia as well as removal of the skin lesion from her right wrist. We will schedule this out beyond 6 weeks to allow her to get over her bronchitis. I encouraged her if at all possible to try it off cigarettes in to consult her primary physician for help with this.  I told her to call me if her lungs are not back to at least baseline within a couple weeks prior  to planned surgery.

## 2011-10-20 NOTE — Patient Instructions (Signed)
Do your best to get off cigarettes prior to surgery. Your bronchitis will have to be resolved prior to surgery, call if not resolved about 2 weeks prior to scheduled surgery date.

## 2011-12-03 ENCOUNTER — Emergency Department (INDEPENDENT_AMBULATORY_CARE_PROVIDER_SITE_OTHER): Payer: Medicaid Other

## 2011-12-03 ENCOUNTER — Encounter (HOSPITAL_COMMUNITY): Payer: Self-pay | Admitting: Emergency Medicine

## 2011-12-03 ENCOUNTER — Emergency Department (HOSPITAL_COMMUNITY)
Admission: EM | Admit: 2011-12-03 | Discharge: 2011-12-03 | Disposition: A | Discharge status: Home / Self Care | Payer: Medicaid Other | Source: Home / Self Care

## 2011-12-03 DIAGNOSIS — S43006A Unspecified dislocation of unspecified shoulder joint, initial encounter: Secondary | ICD-10-CM

## 2011-12-03 DIAGNOSIS — S42253A Displaced fracture of greater tuberosity of unspecified humerus, initial encounter for closed fracture: Secondary | ICD-10-CM

## 2011-12-03 NOTE — ED Notes (Signed)
Waiting discharge papers 

## 2011-12-03 NOTE — ED Provider Notes (Signed)
Medical screening examination/treatment/procedure(s) were performed by resident physician or non-physician practitioner and as supervising physician I was immediately available for consultation/collaboration.   KINDL,JAMES DOUGLAS MD.    James D Kindl, MD 12/03/11 1459 

## 2011-12-03 NOTE — ED Notes (Addendum)
Left arm injury. Pt states that she slipped and fell on wet leaves on her porch landing on her left arm.   There is some swelling and most pain is felt at the elbow. Pt states that she can barely move it.   Pt denies any other symptoms.

## 2011-12-03 NOTE — ED Provider Notes (Signed)
History     CSN: [id number]  Arrival date & time 12/03/11  0915   None     Chief Complaint  Patient presents with  . Arm Injury    (Consider location/radiation/quality/duration/timing/severity/associated sxs/prior treatment) HPI Comments: 56-year-old female who was outside 3 days ago approximately 6:30 in the morning when she slipped on some leaves. She fell onto her left side and injured her left arm and shoulder. She presents today with pain and deep ecchymosis of the left humerus and pain in the left shoulder. She was unable to move her left arm she is even unable to make a fist with her digits. Remains fully alert and oriented and denies striking her head neck back chest abdomen or other extremities. She's had no unusual bleeding other than the bruising of her left arm.  Patient is a 56 y.o. female presenting with arm injury.  Arm Injury     Past Medical History  Diagnosis Date  . Depression   . Arthritis   . 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 ADD (attention deficit disorder with hyperactivity)     Past Surgical History  Procedure Date  . Abdominal hysterectomy   . Spine surgery   . Vulvar excision 2006    Cancer in situ  . Oophorectomy 2006    cervical cancer  . Hernia repair     incisional/ventral hernia repair  . Rectovaginal fistula closure 2012  . Colectomy     Family History  Problem Relation Age of Onset  . COPD Father   . Cancer Father     lung  . Cancer Paternal Aunt     lung    History  Substance Use Topics  . Smoking status: Current Every Day Smoker -- 0.5 packs/day    Types: Cigarettes  . Smokeless tobacco: Never Used  . Alcohol Use: No    OB History    Grav Para Term Preterm Abortions TAB SAB Ect Mult Living                  Review of Systems  Constitutional: Negative for fever, chills and activity change.  HENT: Negative.   Respiratory: Negative.   Cardiovascular: Negative.   Musculoskeletal:       As per HPI  Skin: Negative for  color change, pallor and rash.  Neurological: Negative.     Allergies  Penicillins; Doxycycline; and Erythromycin  Home Medications   Current Outpatient Rx  Name Route Sig Dispense Refill  . ALPRAZOLAM 2 MG PO TABS Oral Take 1 mg by mouth 4 (four) times daily. Patient takes 1/2 tablet by mouth four times a day    . ARIPIPRAZOLE 5 MG PO TABS Oral Take 10 mg by mouth daily.     . ATOMOXETINE HCL 40 MG PO CAPS Oral Take 100 mg by mouth daily.     Marland Kitchen FLUOXETINE HCL 20 MG PO CAPS Oral Take 20 mg by mouth daily.    Marland Kitchen HYDROCODONE-ACETAMINOPHEN 10-650 MG PO TABS Oral Take 1 tablet by mouth every 6 (six) hours as needed. For pain     . LITHIUM CARBONATE ER 300 MG PO TBCR      . METHYLPHENIDATE HCL 10 MG PO TABS Oral Take 10 mg by mouth daily.    . MULTIVITAMIN PO Oral Take 1 tablet by mouth daily.     Marland Kitchen NORTRIPTYLINE HCL 25 MG PO CAPS Oral Take 25 mg by mouth 3 (three) times daily.    Marland Kitchen TEMAZEPAM 15 MG PO CAPS  Oral Take 15 mg by mouth at bedtime.    Marland Kitchen VALACYCLOVIR HCL 500 MG PO TABS Oral Take 1 tablet (500 mg total) by mouth 2 (two) times daily. 10 tablet 0  . VENLAFAXINE HCL 75 MG PO TABS Oral Take 75 mg by mouth daily.    Marland Kitchen VITAMIN B-12 250 MCG PO TABS Oral Take 250 mcg by mouth daily.      Marland Kitchen VITAMIN D (ERGOCALCIFEROL) 50000 UNITS PO CAPS Oral Take 50,000 Units by mouth every 7 (seven) days.        BP 154/92  Pulse 102  Temp 98 F (36.7 C) (Oral)  Resp 22  SpO2 93%  Physical Exam  Constitutional: She is oriented to person, place, and time. She appears well-developed and well-nourished. No distress.  HENT:  Head: Normocephalic and atraumatic.  Eyes: EOM are normal. Pupils are equal, round, and reactive to light.  Neck: Normal range of motion. Neck supple.  Cardiovascular: Normal rate, regular rhythm and normal heart sounds.   Pulmonary/Chest: Effort normal.       Breath sounds mildly diminished but no prolonged expiratory phases  Musculoskeletal:       Swelling and tenderness to  the left upper arm. Tenderness but no deformity or evidence of injury to the left forearm and hand. Tenderness without deformity to the left shoulder joint. No tenderness to the head neck or spine. Distally the fingers are warm with normal sensitivity and brisk capillary refill. She is able to wiggle her fingers but not make a fist to due to the arm pain  Lymphadenopathy:    She has no cervical adenopathy.  Neurological: She is alert and oriented to person, place, and time. No cranial nerve deficit.  Skin: Skin is warm and dry.  Psychiatric: She has a normal mood and affect.    ED Course  Procedures (including critical care time)  Labs Reviewed  GLUCOSE, CAPILLARY - Abnormal; Notable for the following:    Glucose-Capillary 185 (*)     All other components within normal limits   Dg Shoulder Left  12/03/2011  *RADIOLOGY REPORT*  Clinical Data: Arm injury  LEFT SHOULDER - 2+ VIEW  Comparison: Left humerus same day  Findings: Three views of the left shoulder submitted.  Again noted subtle deformity of the greater humeral tuberosity suspicious for fracture.  There is a inferior position of the humeral head again noted with widening of the subacromial space suspicious for inferior subluxation. Superimposed rotator cuff injury cannot be excluded.  Clinical correlation is necessary.  IMPRESSION: Again noted subtle deformity of the greater humeral tuberosity suspicious for fracture.  There is a inferior position of the humeral head again noted with widening of the subacromial space suspicious for inferior subluxation. Superimposed rotator cuff injury cannot be excluded.  Clinical correlation is necessary.   Original Report Authenticated By: Natasha Mead, M.D.    Dg Humerus Left  12/03/2011  *RADIOLOGY REPORT*  Clinical Data: Injury  LEFT HUMERUS - 2+ VIEW  Comparison: 10/31/2006  Findings: There appears to be a comminuted fracture deformity involving the greater tuberosity of the proximal left humerus.  There is no dislocation.  No radiopaque foreign body or soft tissue calcification.  IMPRESSION:  1. Suspect fracture deformity involving the greater tuberosity of the proximal humerus.  Correlation with shoulder series recommended.   Original Report Authenticated By: Rosealee Albee, M.D.      1. Greater tuberosity of humerus fracture   2. Dislocation closed, shoulder  MDM  Spoke with Dr. Lajoyce Corners to 11:20 AM His instructions were to place her in an arm sling and followup with him in the office tomorrow morning.        Hayden Rasmussen, NP 12/03/11 1125

## 2011-12-27 ENCOUNTER — Ambulatory Visit: Payer: Medicaid Other | Attending: Orthopedic Surgery | Admitting: Physical Therapy

## 2011-12-27 DIAGNOSIS — M25519 Pain in unspecified shoulder: Secondary | ICD-10-CM | POA: Insufficient documentation

## 2011-12-27 DIAGNOSIS — M25619 Stiffness of unspecified shoulder, not elsewhere classified: Secondary | ICD-10-CM | POA: Insufficient documentation

## 2011-12-27 DIAGNOSIS — IMO0001 Reserved for inherently not codable concepts without codable children: Secondary | ICD-10-CM | POA: Insufficient documentation

## 2012-01-02 ENCOUNTER — Telehealth (INDEPENDENT_AMBULATORY_CARE_PROVIDER_SITE_OTHER): Payer: Self-pay

## 2012-01-02 NOTE — Telephone Encounter (Signed)
Follow up call to patient -- patient reports she's having a lot of pain from her broken shoulder, she has cancelled her surgery and will reschedule after her MD clears her for surgery in the future.

## 2012-01-03 ENCOUNTER — Inpatient Hospital Stay (HOSPITAL_COMMUNITY): Admission: RE | Admit: 2012-01-03 | Payer: Medicaid Other | Source: Ambulatory Visit | Admitting: General Surgery

## 2012-01-03 ENCOUNTER — Encounter (HOSPITAL_COMMUNITY): Admission: RE | Payer: Self-pay | Source: Ambulatory Visit

## 2012-01-03 SURGERY — DEBRIDEMENT, WOUND
Anesthesia: General | Laterality: Right

## 2012-01-04 ENCOUNTER — Ambulatory Visit: Payer: Medicaid Other | Admitting: Physical Therapy

## 2012-01-10 ENCOUNTER — Ambulatory Visit: Payer: Medicaid Other | Admitting: Physical Therapy

## 2012-01-17 ENCOUNTER — Ambulatory Visit: Payer: Medicaid Other | Attending: Orthopedic Surgery | Admitting: Rehabilitation

## 2012-01-17 DIAGNOSIS — IMO0001 Reserved for inherently not codable concepts without codable children: Secondary | ICD-10-CM | POA: Insufficient documentation

## 2012-01-17 DIAGNOSIS — M25619 Stiffness of unspecified shoulder, not elsewhere classified: Secondary | ICD-10-CM | POA: Insufficient documentation

## 2012-01-17 DIAGNOSIS — M25519 Pain in unspecified shoulder: Secondary | ICD-10-CM | POA: Insufficient documentation

## 2012-05-15 ENCOUNTER — Telehealth (INDEPENDENT_AMBULATORY_CARE_PROVIDER_SITE_OTHER): Payer: Self-pay

## 2012-05-15 NOTE — Telephone Encounter (Signed)
Called patient to see how she's doing.  Patient reports she continues to have drainage and need's to schedule surgery.  Patient husband has been diagnosed with lung cancer that has metastasize to his brain.  She is currently his caregiver.  I advised patient to call when she would like to schedule an appointment with Dr. Johna Sheriff.  I advised patient that it may be beneficial to wait until things are less stressful at home to schedule surgery.  But, she may call us at anytime and we can schedule an office visit.  I expressed my deepest apologies to patient regarding her husbands recent diagnosis.

## 2012-08-30 ENCOUNTER — Telehealth (INDEPENDENT_AMBULATORY_CARE_PROVIDER_SITE_OTHER): Payer: Self-pay

## 2012-08-30 NOTE — Telephone Encounter (Signed)
Patient is asking for a return call she is ready to move forward with surgery. She is very emotional at this time her husband passed away last week with CA

## 2012-09-05 ENCOUNTER — Telehealth (INDEPENDENT_AMBULATORY_CARE_PROVIDER_SITE_OTHER): Payer: Self-pay

## 2012-09-05 NOTE — Telephone Encounter (Signed)
Called to see how Ms. [name] is doing.  [name] spouse recently passed away and she has had a very difficult time with the loss of her spouse.

## 2012-09-19 ENCOUNTER — Telehealth (INDEPENDENT_AMBULATORY_CARE_PROVIDER_SITE_OTHER): Payer: Self-pay

## 2012-09-19 ENCOUNTER — Encounter (INDEPENDENT_AMBULATORY_CARE_PROVIDER_SITE_OTHER): Payer: Medicaid Other | Admitting: General Surgery

## 2012-09-19 NOTE — Telephone Encounter (Signed)
Called and left message for patient.  Patient battling depression and recently lost her spouse to cancer.  Concerned about patient and would like to directly speak with her.  Unable to make contact with patient (2nd attempt) patient was scheduled for appointment today with Dr. Johna Sheriff and No Showed.

## 2012-09-19 NOTE — Telephone Encounter (Signed)
Called and spoke to patient's mother (emergency contact) to check on patient.  Mother reports "she's hanging in there"  I expressed my concern for patient's well-being.  Mother states "I'll let her know you called to check on her"

## 2012-09-20 ENCOUNTER — Ambulatory Visit (HOSPITAL_COMMUNITY)
Admission: RE | Admit: 2012-09-20 | Discharge: 2012-09-20 | Disposition: A | Discharge status: Home / Self Care | Payer: Medicaid Other | Source: Ambulatory Visit | Attending: General Surgery | Admitting: General Surgery

## 2012-09-20 ENCOUNTER — Encounter (INDEPENDENT_AMBULATORY_CARE_PROVIDER_SITE_OTHER): Payer: Self-pay | Admitting: General Surgery

## 2012-09-20 ENCOUNTER — Telehealth (INDEPENDENT_AMBULATORY_CARE_PROVIDER_SITE_OTHER): Payer: Self-pay | Admitting: *Deleted

## 2012-09-20 ENCOUNTER — Telehealth (INDEPENDENT_AMBULATORY_CARE_PROVIDER_SITE_OTHER): Payer: Self-pay | Admitting: General Surgery

## 2012-09-20 ENCOUNTER — Other Ambulatory Visit (INDEPENDENT_AMBULATORY_CARE_PROVIDER_SITE_OTHER): Payer: Self-pay

## 2012-09-20 DIAGNOSIS — M5137 Other intervertebral disc degeneration, lumbosacral region: Secondary | ICD-10-CM | POA: Insufficient documentation

## 2012-09-20 DIAGNOSIS — T8140XA Infection following a procedure, unspecified, initial encounter: Secondary | ICD-10-CM

## 2012-09-20 DIAGNOSIS — K5730 Diverticulosis of large intestine without perforation or abscess without bleeding: Secondary | ICD-10-CM | POA: Insufficient documentation

## 2012-09-20 DIAGNOSIS — Z90710 Acquired absence of both cervix and uterus: Secondary | ICD-10-CM | POA: Insufficient documentation

## 2012-09-20 DIAGNOSIS — N949 Unspecified condition associated with female genital organs and menstrual cycle: Secondary | ICD-10-CM | POA: Insufficient documentation

## 2012-09-20 DIAGNOSIS — R109 Unspecified abdominal pain: Secondary | ICD-10-CM | POA: Insufficient documentation

## 2012-09-20 DIAGNOSIS — K7689 Other specified diseases of liver: Secondary | ICD-10-CM | POA: Insufficient documentation

## 2012-09-20 DIAGNOSIS — M51379 Other intervertebral disc degeneration, lumbosacral region without mention of lumbar back pain or lower extremity pain: Secondary | ICD-10-CM | POA: Insufficient documentation

## 2012-09-20 DIAGNOSIS — R112 Nausea with vomiting, unspecified: Secondary | ICD-10-CM | POA: Insufficient documentation

## 2012-09-20 DIAGNOSIS — R110 Nausea: Secondary | ICD-10-CM

## 2012-09-20 DIAGNOSIS — K439 Ventral hernia without obstruction or gangrene: Secondary | ICD-10-CM | POA: Insufficient documentation

## 2012-09-20 MED ORDER — METRONIDAZOLE 500 MG PO TABS: 500.0000 mg | ORAL_TABLET | Freq: Three times a day (TID) | ORAL | Status: DC

## 2012-09-20 MED ORDER — IOHEXOL 300 MG/ML  SOLN
100.0000 mL | Freq: Once | INTRAMUSCULAR | Status: AC | PRN
  Administered 2012-09-20: 100 mL via INTRAVENOUS

## 2012-09-20 NOTE — Progress Notes (Signed)
Patient comes into office today thinking her appointment was with Dr. Johna Sheriff.  Patient was scheduled for appointment on 09/19/12 and missed appointment due to confusion with date & time.  Patient complaining of abdominal pain, nausea.  Patient states that she has been having some drainage from her midline abdominal incision.  Dr. Biagio Quint was ask to check patient drainage from her incision.  Paged Dr. Johna Sheriff to discuss patient concerns.  Per Dr. Johna Sheriff order Flagyl 500 mg, take 1 (one) tablet, TID (3 times) a day, #30.  Patient states her PCP prescribed Levaquin and she states she will pick rx up this afternoon.  Patient scheduled a CT abd/pelvis.

## 2012-09-20 NOTE — Telephone Encounter (Signed)
Called patient at 5:37 09/20/12 to let her know her CT results per [facility] note...patient is aware and agreeable

## 2012-09-20 NOTE — Telephone Encounter (Signed)
Patient called to ask for her CT results.  Patient is wanting the results by the end of the day.  Explained to patient that Dr. Johna Sheriff is in the OR at this time however a message will be sent to him and Neysa Bonito CMA to see if he can interpret the CT and let patient know the results.  Patient states understanding and agreeable at this time.

## 2012-09-23 ENCOUNTER — Emergency Department (HOSPITAL_COMMUNITY)
Admission: EM | Admit: 2012-09-23 | Discharge: 2012-09-23 | Disposition: A | Discharge status: Home / Self Care | Payer: Medicaid Other | Attending: Emergency Medicine | Admitting: Emergency Medicine

## 2012-09-23 ENCOUNTER — Encounter (HOSPITAL_COMMUNITY): Payer: Self-pay | Admitting: *Deleted

## 2012-09-23 DIAGNOSIS — I10 Essential (primary) hypertension: Secondary | ICD-10-CM | POA: Insufficient documentation

## 2012-09-23 DIAGNOSIS — M25519 Pain in unspecified shoulder: Secondary | ICD-10-CM | POA: Insufficient documentation

## 2012-09-23 DIAGNOSIS — J40 Bronchitis, not specified as acute or chronic: Secondary | ICD-10-CM | POA: Insufficient documentation

## 2012-09-23 DIAGNOSIS — F909 Attention-deficit hyperactivity disorder, unspecified type: Secondary | ICD-10-CM | POA: Insufficient documentation

## 2012-09-23 DIAGNOSIS — Z888 Allergy status to other drugs, medicaments and biological substances status: Secondary | ICD-10-CM | POA: Insufficient documentation

## 2012-09-23 DIAGNOSIS — F17200 Nicotine dependence, unspecified, uncomplicated: Secondary | ICD-10-CM | POA: Insufficient documentation

## 2012-09-23 DIAGNOSIS — Z880 Allergy status to penicillin: Secondary | ICD-10-CM | POA: Insufficient documentation

## 2012-09-23 DIAGNOSIS — F3289 Other specified depressive episodes: Secondary | ICD-10-CM | POA: Insufficient documentation

## 2012-09-23 DIAGNOSIS — G8929 Other chronic pain: Secondary | ICD-10-CM | POA: Insufficient documentation

## 2012-09-23 DIAGNOSIS — Z79899 Other long term (current) drug therapy: Secondary | ICD-10-CM | POA: Insufficient documentation

## 2012-09-23 DIAGNOSIS — Z881 Allergy status to other antibiotic agents status: Secondary | ICD-10-CM | POA: Insufficient documentation

## 2012-09-23 DIAGNOSIS — R209 Unspecified disturbances of skin sensation: Secondary | ICD-10-CM | POA: Insufficient documentation

## 2012-09-23 DIAGNOSIS — J449 Chronic obstructive pulmonary disease, unspecified: Secondary | ICD-10-CM | POA: Insufficient documentation

## 2012-09-23 DIAGNOSIS — J4489 Other specified chronic obstructive pulmonary disease: Secondary | ICD-10-CM | POA: Insufficient documentation

## 2012-09-23 DIAGNOSIS — F329 Major depressive disorder, single episode, unspecified: Secondary | ICD-10-CM | POA: Insufficient documentation

## 2012-09-23 DIAGNOSIS — K219 Gastro-esophageal reflux disease without esophagitis: Secondary | ICD-10-CM | POA: Insufficient documentation

## 2012-09-23 DIAGNOSIS — R109 Unspecified abdominal pain: Secondary | ICD-10-CM | POA: Insufficient documentation

## 2012-09-23 DIAGNOSIS — J45909 Unspecified asthma, uncomplicated: Secondary | ICD-10-CM | POA: Insufficient documentation

## 2012-09-23 DIAGNOSIS — Z8719 Personal history of other diseases of the digestive system: Secondary | ICD-10-CM | POA: Insufficient documentation

## 2012-09-23 DIAGNOSIS — F411 Generalized anxiety disorder: Secondary | ICD-10-CM | POA: Insufficient documentation

## 2012-09-23 DIAGNOSIS — M129 Arthropathy, unspecified: Secondary | ICD-10-CM | POA: Insufficient documentation

## 2012-09-23 DIAGNOSIS — F419 Anxiety disorder, unspecified: Secondary | ICD-10-CM | POA: Insufficient documentation

## 2012-09-23 HISTORY — DX: Anxiety disorder, unspecified: F41.9

## 2012-09-23 HISTORY — DX: Chronic obstructive pulmonary disease, unspecified: J44.9

## 2012-09-23 HISTORY — DX: Gastro-esophageal reflux disease without esophagitis: K21.9

## 2012-09-23 HISTORY — DX: Essential (primary) hypertension: I10

## 2012-09-23 HISTORY — DX: Unspecified asthma, uncomplicated: J45.909

## 2012-09-23 MED ORDER — HYDROCHLOROTHIAZIDE 25 MG PO TABS: 25.0000 mg | ORAL_TABLET | Freq: Every day | ORAL | Status: DC

## 2012-09-23 NOTE — ED Notes (Signed)
C/o intermittent dizziness since Thursday. Nausea x 1 week, currently takes nausea med from PCP for it.  Pt reports used to take BP medicine but stopped taking it a year ago, states took herself off if it because "it was fine".  Denies CP, SOB, diarrhea, fever, cold, cough. Last BM this morning.

## 2012-09-23 NOTE — ED Notes (Signed)
From Ortho MD's office - sent here for high BP. Pt c/o dizziness & nausea since Thursday morning. Pt admits to not taking BP meds for a year

## 2012-09-23 NOTE — ED Provider Notes (Signed)
CSN: [id number]     Arrival date & time 09/23/12  4098 History     First MD Initiated Contact with Patient 09/23/12 [phone]     Chief Complaint  Patient presents with  . Hypertension   (Consider location/radiation/quality/duration/timing/severity/associated sxs/prior Treatment) Patient is a 57 y.o. female presenting with hypertension. The history is provided by the patient.  Hypertension This is a chronic problem. The current episode started more than 1 year ago. The problem occurs constantly. The problem has been gradually worsening. Pertinent negatives include no abdominal pain, chest pain, chills, congestion, coughing, diaphoresis, fatigue, fever, headaches, nausea, neck pain, numbness, rash, sore throat or vomiting. Exacerbated by: stress. She has tried nothing for the symptoms. The treatment provided no relief.    Past Medical History  Diagnosis Date  . Depression   . Arthritis   . 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 ADD (attention deficit disorder with hyperactivity)   . Hypertension   . COPD (chronic obstructive pulmonary disease)   . Asthma   . GERD (gastroesophageal reflux disease)   . Anxiety   . Bronchitis    Past Surgical History  Procedure Laterality Date  . Abdominal hysterectomy    . Spine surgery    . Vulvar excision  2006    Cancer in situ  . Oophorectomy  2006    cervical cancer  . Hernia repair      incisional/ventral hernia repair  . Rectovaginal fistula closure  2012  . Colectomy     Family History  Problem Relation Age of Onset  . COPD Father   . Cancer Father     lung  . Cancer Paternal Aunt     lung   History  Substance Use Topics  . Smoking status: Current Every Day Smoker -- 0.50 packs/day    Types: Cigarettes  . Smokeless tobacco: Never Used  . Alcohol Use: No   OB History   Grav Para Term Preterm Abortions TAB SAB Ect Mult Living                 Review of Systems  Constitutional: Negative for fever, chills, diaphoresis and fatigue.   HENT: Negative for ear pain, congestion, sore throat, facial swelling, mouth sores, trouble swallowing, neck pain and neck stiffness.   Eyes: Negative.   Respiratory: Negative for apnea, cough, chest tightness, shortness of breath and wheezing.   Cardiovascular: Negative for chest pain, palpitations and leg swelling.  Gastrointestinal: Negative for nausea, vomiting, abdominal pain, diarrhea and abdominal distention.  Genitourinary: Negative for hematuria, flank pain, vaginal discharge, difficulty urinating and menstrual problem.  Musculoskeletal: Negative for back pain and gait problem.  Skin: Negative for rash and wound.  Neurological: Negative for dizziness, tremors, seizures, syncope, facial asymmetry, numbness and headaches.  Psychiatric/Behavioral: Negative.   All other systems reviewed and are negative.    Allergies  Penicillins; Contrast media; Doxycycline; Erythromycin; and Metronidazole  Home Medications   Current Outpatient Rx  Name  Route  Sig  Dispense  Refill  . albuterol (PROVENTIL HFA;VENTOLIN HFA) 108 (90 BASE) MCG/ACT inhaler   Inhalation   Inhale 2 puffs into the lungs 2 (two) times daily as needed for wheezing.         Marland Kitchen alprazolam (XANAX) 2 MG tablet   Oral   Take 1 mg by mouth 4 (four) times daily. Patient takes 1/2 tablet by mouth four times a day         . ARIPiprazole (ABILIFY) 5 MG tablet   Oral  Take 5 mg by mouth daily.          Marland Kitchen atomoxetine (STRATTERA) 40 MG capsule   Oral   Take 40 mg by mouth daily.          Marland Kitchen doxepin (SINEQUAN) 25 MG capsule   Oral   Take 25 mg by mouth at bedtime as needed (for sleep).         Marland Kitchen FLUoxetine (PROZAC) 20 MG capsule   Oral   Take 40 mg by mouth daily.          Marland Kitchen FOLIC ACID PO   Oral   Take 1 tablet by mouth daily.         Marland Kitchen HYDROcodone-acetaminophen (LORCET) 10-650 MG per tablet   Oral   Take 1 tablet by mouth every 6 (six) hours as needed. For pain          . levofloxacin  (LEVAQUIN) 500 MG tablet   Oral   Take 500 mg by mouth daily.         . Multiple Vitamin (MULTIVITAMIN PO)   Oral   Take 1 tablet by mouth daily.          Marland Kitchen oxyCODONE-acetaminophen (PERCOCET/ROXICET) 5-325 MG per tablet   Oral   Take 1 tablet by mouth every 4 (four) hours as needed for pain.         . Vitamin D, Ergocalciferol, (DRISDOL) 50000 UNITS CAPS   Oral   Take 50,000 Units by mouth every 7 (seven) days. On thursday         . hydrochlorothiazide (HYDRODIURIL) 25 MG tablet   Oral   Take 1 tablet (25 mg total) by mouth daily.   30 tablet   0    BP 179/94  Pulse 86  Temp(Src) 98.8 F (37.1 C) (Oral)  Resp 18  Ht 5\' 4"  (1.626 m)  Wt 210 lb (95.255 kg)  BMI 36.03 kg/m2  SpO2 97% Physical Exam  Nursing note and vitals reviewed. Constitutional: She is oriented to person, place, and time. She appears well-developed and well-nourished. No distress.  HENT:  Head: Normocephalic and atraumatic.  Right Ear: External ear normal.  Left Ear: External ear normal.  Nose: Nose normal.  Mouth/Throat: Oropharynx is clear and moist. No oropharyngeal exudate.  Eyes: Conjunctivae and EOM are normal. Pupils are equal, round, and reactive to light. Right eye exhibits no discharge. Left eye exhibits no discharge.  Fundoscopic exam:      The right eye shows no AV nicking, no exudate, no hemorrhage and no papilledema.       The left eye shows no AV nicking, no exudate, no hemorrhage and no papilledema.  Neck: Normal range of motion. Neck supple. No JVD present. No tracheal deviation present. No thyromegaly present.  Cardiovascular: Normal rate, regular rhythm, normal heart sounds and intact distal pulses.  Exam reveals no gallop and no friction rub.   No murmur heard. Pulmonary/Chest: Effort normal and breath sounds normal. No respiratory distress. She has no wheezes. She has no rales. She exhibits no tenderness.  Abdominal: Soft. Bowel sounds are normal. She exhibits no  distension. There is no tenderness. There is no rebound and no guarding.  Musculoskeletal: Normal range of motion.  Lymphadenopathy:    She has no cervical adenopathy.  Neurological: She is alert and oriented to person, place, and time. No cranial nerve deficit or sensory deficit. Coordination and gait normal. GCS eye subscore is 4. GCS verbal subscore is 5. GCS motor subscore  is 6.  Normal finger to nose and heel to shin. Normal Gait  Skin: Skin is warm. No rash noted. She is not diaphoretic.  Psychiatric: She has a normal mood and affect. Her behavior is normal. Judgment and thought content normal.    ED Course   Procedures (including critical care time)  Labs Reviewed - No data to display No results found. 1. Hypertension     MDM  1 yr F with multiple chronic issues. She has chronic abdominal pain related to surgery done two years ago. She is seeing surgeon chronically for that and conditions have not worsened. Patient also with chronic left shoulder pain and tingling and is seeing an orthopedist. She was at orthopedist office today when they noticed that her blood pressure was 180/100. She says she has not been taking BP medicines for one year. Here patient without new symptoms. No concerning HA, CP. Vision is normal. Fundoscopic exam is normal, EKG is normal. Since there are no signs of end organ damage doubt hypertensive encephalopathy. No signs of stroke or hypertensive heart dysfunction. Will restart her on HCTZ and she will follow up with PCP as soon as possible.   Date: 09/23/2012  Rate: 83  Rhythm: normal sinus rhythm  QRS Axis: normal  Intervals: normal  ST/T Wave abnormalities: normal  Conduction Disutrbances:none  Narrative Interpretation:   Old EKG Reviewed: unchanged  Case discussed with Dr. Benjamin Stain, MD 09/23/12 1050

## 2012-09-23 NOTE — ED Provider Notes (Signed)
I saw and evaluated the patient, reviewed the resident's note and I agree with the findings and plan and agree with their ECG interpretation. Sent in byOrtho for hypertension. Multiple chronic complaints. Has been off her medication. We'll start medication and discharge. No clear end organ damage  Juliet Rude. Rubin Payor, MD 09/23/12 1212

## 2012-09-25 ENCOUNTER — Encounter (INDEPENDENT_AMBULATORY_CARE_PROVIDER_SITE_OTHER): Payer: Self-pay | Admitting: General Surgery

## 2012-09-27 ENCOUNTER — Other Ambulatory Visit (INDEPENDENT_AMBULATORY_CARE_PROVIDER_SITE_OTHER): Payer: Self-pay

## 2012-09-27 DIAGNOSIS — T8140XA Infection following a procedure, unspecified, initial encounter: Secondary | ICD-10-CM

## 2012-09-27 DIAGNOSIS — L989 Disorder of the skin and subcutaneous tissue, unspecified: Secondary | ICD-10-CM

## 2012-09-27 MED ORDER — METRONIDAZOLE 500 MG PO TABS: 500.0000 mg | ORAL_TABLET | Freq: Three times a day (TID) | ORAL | Status: AC

## 2012-10-01 ENCOUNTER — Other Ambulatory Visit (HOSPITAL_COMMUNITY): Payer: Self-pay | Admitting: Orthopedic Surgery

## 2012-10-01 DIAGNOSIS — M25512 Pain in left shoulder: Secondary | ICD-10-CM

## 2012-10-03 ENCOUNTER — Ambulatory Visit (INDEPENDENT_AMBULATORY_CARE_PROVIDER_SITE_OTHER): Payer: Medicaid Other | Admitting: General Surgery

## 2012-10-03 VITALS — BP 142/92 | HR 88 | Temp 97.6°F | Resp 16 | Wt 196.0 lb

## 2012-10-03 DIAGNOSIS — K439 Ventral hernia without obstruction or gangrene: Secondary | ICD-10-CM

## 2012-10-03 DIAGNOSIS — Z5189 Encounter for other specified aftercare: Secondary | ICD-10-CM

## 2012-10-03 DIAGNOSIS — IMO0001 Reserved for inherently not codable concepts without codable children: Secondary | ICD-10-CM

## 2012-10-03 NOTE — Progress Notes (Signed)
Chief complaint: Abdominal pain wound drainage  History: Patient returns to the office. I not seen her in over 6 months. She has a history of sigmoid colectomy for colovaginal fistula. She had had a previous low normal incisional hernia repair with mesh. 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 We had previously done a wound -- debridement but she again healed anatricrotic fistula tract. This has persisted. She had some increased pain a couple of weeks ago and was seen urgently in the office. She was started on Flagyl but she was unable to take this due to nausea. This occurs with essentially all antibiotics. She had a CT scan obtained which I've reviewed. She has 3 moderate-sized ventral hernias in the midline in her upper mid and lower abdomen. No incarceration. She has a chronic sinus tract down to the suprapubic area but no large undrained collection of fluid. She states the pain is a little better there was a couple of weeks ago. She recently lost her husband and has been very depressed. No fever or chills. No change in the character or amount of drainage.  Exam: BP 142/92  Pulse 88  Temp(Src) 97.6 F (36.4 C)  Resp 16  Wt 196 lb (88.905 kg)  BMI 33.63 kg/m2 General: Flat affect appears chronically depressed Abdomen: There are hernias palpable in the midabdomen umbilicus and upper abdomen. There is a tiny open sinus tract suprapubically with a little bit of purulent drainage but no particular tenderness or swelling or erythema or mass.  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  I discussed with her that the ultimate resolution for this would be to remove all the abdominal mesh and do an extensive complete hernia repair using biologic material. We discussed the nature of the surgery and this would be a significant operation with a lot of potential complications including infection,  recurrence. A big issue is that she continues to smoke I told her that I certainly would not perform the surgery, she could stop smoking.  At this point however I do not see that there is any real change from her chronic condition that she has had for a year or 2. No urgent surgical intervention is needed. She has a fistula tract but no evidence of cellulitis or active infection. She will call for any worsening symptoms otherwise will plan to see her in followup in 2-3 months. She will consider whether she wants to stop smoking and look moving forward with surgery

## 2012-10-04 ENCOUNTER — Ambulatory Visit (HOSPITAL_COMMUNITY): Admission: RE | Admit: 2012-10-04 | Payer: Medicaid Other | Source: Ambulatory Visit

## 2012-10-24 ENCOUNTER — Telehealth (INDEPENDENT_AMBULATORY_CARE_PROVIDER_SITE_OTHER): Payer: Self-pay

## 2012-10-24 NOTE — Telephone Encounter (Signed)
Pt called stating she is still having abd wall discomfort from hernia and open wd. Pt not running fever. Drainage has not changed. BMs and voiding normal. Diet normal. Pt asking for refill of percocet. She states last refill was at [facility] on 10-04-12. I advised pt request will be sent to Dr Johna Sheriff and Neysa Bonito. Pt aware this rx has to be written.

## 2012-10-24 NOTE — Telephone Encounter (Signed)
Pain medication request denied at this time.  Patient need's to see PCP for further pain management.

## 2012-10-25 NOTE — Telephone Encounter (Signed)
Pt calling this morning requesting pain medication.  She was told her request was denied, and she will need to see her PCP for further management.  Pt. Agreed.

## 2012-10-28 ENCOUNTER — Ambulatory Visit: Payer: Medicaid Other | Attending: Orthopedic Surgery | Admitting: Physical Therapy

## 2012-11-12 ENCOUNTER — Encounter (INDEPENDENT_AMBULATORY_CARE_PROVIDER_SITE_OTHER): Payer: Self-pay

## 2012-12-06 ENCOUNTER — Telehealth (INDEPENDENT_AMBULATORY_CARE_PROVIDER_SITE_OTHER): Payer: Self-pay

## 2012-12-06 NOTE — Telephone Encounter (Signed)
Called and left message for patient to call our office for her 3 month follow up appointment with Dr. Johna Sheriff.  Appointment has been scheduled for 01/08/13 @ 10:30 w/Dr. Johna Sheriff.

## 2012-12-11 ENCOUNTER — Encounter (INDEPENDENT_AMBULATORY_CARE_PROVIDER_SITE_OTHER): Payer: Self-pay | Admitting: General Surgery

## 2012-12-11 ENCOUNTER — Ambulatory Visit (INDEPENDENT_AMBULATORY_CARE_PROVIDER_SITE_OTHER): Payer: Self-pay | Admitting: General Surgery

## 2012-12-11 VITALS — BP 162/78 | HR 92 | Temp 97.0°F | Resp 20 | Ht 63.0 in | Wt 185.2 lb

## 2012-12-11 DIAGNOSIS — Z5189 Encounter for other specified aftercare: Secondary | ICD-10-CM

## 2012-12-11 NOTE — Progress Notes (Signed)
Chief complaint: Abdominal pain wound drainage  History: Patient returns to the office.. She has a history of sigmoid colectomy for colovaginal fistula. She had had a previous low normal incisional hernia repair with mesh. 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 We had previously done a wound -- debridement but she again healed with a chronic f fistula tract. This has persisted.  She had a CT scan obtained which I've reviewed. She has 3 moderate-sized ventral hernias in the midline in her upper mid and lower abdomen. No incarceration. She has a chronic sinus tract down to the suprapubic area but no large undrained collection of fluid. She called to come in for an appointment because she has noted some increased drainage which she felt was bloody. A little soreness but no severe pain. Exam: BP 162/78  Pulse 92  Temp(Src) 97 F (36.1 C) (Oral)  Resp 20  Ht 5\' 3"  (1.6 m)  Wt 185 lb 3.2 oz (84.006 kg)  BMI 32.81 kg/m2 General: Flat affect appears chronically depressed Abdomen: There are hernias palpable in the midabdomen umbilicus and upper abdomen. There is a tiny open sinus tract suprapubically with a little bit of purulent drainage but no particular tenderness or swelling or erythema or mass.  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  I don't believe that there has been any significant change in her condition. The drainage is a tiny bit blood-tinged which is what she was seen but there is no significant bleeding. I don't see any evidence of cellulitis or abscess. She is now on prednisone due to some chronic respiratory difficulties and has been unable to stop smoking to date. We have previously discussed that this would take an extensive operation with removal of mesh and hernia repair with biologic material and I would not do this while she is still smoking. She is minimally symptomatic. I will  see her for another check in about 3 months. I encouraged her to continue any efforts to reduce or quit smoking.

## 2012-12-17 ENCOUNTER — Encounter (HOSPITAL_COMMUNITY): Payer: Self-pay | Admitting: *Deleted

## 2012-12-17 ENCOUNTER — Inpatient Hospital Stay (HOSPITAL_COMMUNITY)
Admission: AD | Admit: 2012-12-17 | Discharge: 2012-12-17 | Disposition: A | Discharge status: Home / Self Care | Payer: Medicaid Other | Source: Ambulatory Visit | Attending: Obstetrics & Gynecology | Admitting: Obstetrics & Gynecology

## 2012-12-17 DIAGNOSIS — E119 Type 2 diabetes mellitus without complications: Secondary | ICD-10-CM | POA: Insufficient documentation

## 2012-12-17 LAB — GLUCOSE, CAPILLARY: Glucose-Capillary: 349 mg/dL — ABNORMAL HIGH (ref 70–99)

## 2012-12-17 NOTE — MAU Note (Signed)
Pt seen @ optometrist yesterday, checked CBG there that was 500.  Pt was given two prescriptions & took the medications as directed.  Pt states she checked her CBG today @ 0700 & it was 480.  She has appointment with her dr tomorrow.

## 2012-12-17 NOTE — MAU Provider Note (Signed)
History     CSN: [id number]  Arrival date and time: 12/17/12 1017   First Provider Initiated Contact with Patient 12/17/12 1043      Chief Complaint  Patient presents with  . Hyperglycemia   HPI  Pt is a 57 yo Caucasian woman here with report of elevated blood glucose at home of 425.  Pt diagnosed with Type II DM yesterday and put on oral medications.  Appt with PCP tomorrow.  Here at [facility] with niece who's having surgery and wanted blood sugar checked.  Reports blurred vision that started 4 months ago which is what triggered appointment with opthalmology which led to DM diagnosis.  Pt denies any other concerns at this visit.    Past Medical History  Diagnosis Date  . Depression   . Arthritis   . 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 ADD (attention deficit disorder with hyperactivity)   . Hypertension   . COPD (chronic obstructive pulmonary disease)   . Asthma   . GERD (gastroesophageal reflux disease)   . Anxiety   . Bronchitis   . Gestational diabetes     Past Surgical History  Procedure Laterality Date  . Abdominal hysterectomy    . Spine surgery    . Vulvar excision  2006    Cancer in situ  . Oophorectomy  2006    cervical cancer  . Hernia repair      incisional/ventral hernia repair  . Rectovaginal fistula closure  2012  . Colectomy      Family History  Problem Relation Age of Onset  . COPD Father   . Cancer Father     lung  . Cancer Paternal Aunt     lung    History  Substance Use Topics  . Smoking status: Current Every Day Smoker -- 0.50 packs/day    Types: Cigarettes  . Smokeless tobacco: Never Used  . Alcohol Use: No    Allergies:  Allergies  Allergen Reactions  . Penicillins Itching and Rash    All over the body.  . Contrast Media [Iodinated Diagnostic Agents] Nausea And Vomiting  . Doxycycline   . Erythromycin   . Metronidazole Hives and Rash    Prescriptions prior to admission  Medication Sig Dispense Refill  . albuterol  (PROVENTIL HFA;VENTOLIN HFA) 108 (90 BASE) MCG/ACT inhaler Inhale 2 puffs into the lungs 2 (two) times daily as needed for wheezing.      Marland Kitchen alprazolam (XANAX) 2 MG tablet Take 1 mg by mouth 4 (four) times daily. Patient takes 1/2 tablet by mouth four times a day      . amphetamine-dextroamphetamine (ADDERALL) 10 MG tablet Take 10 mg by mouth daily.      . ARIPiprazole (ABILIFY) 5 MG tablet Take 5 mg by mouth daily.       Marland Kitchen atorvastatin (LIPITOR) 20 MG tablet Take 20 mg by mouth daily at 6 PM.      . doxepin (SINEQUAN) 25 MG capsule Take 25 mg by mouth at bedtime as needed (for sleep).      Marland Kitchen FLUoxetine (PROZAC) 20 MG capsule Take 40 mg by mouth daily.       . hydrochlorothiazide (HYDRODIURIL) 25 MG tablet Take 1 tablet (25 mg total) by mouth daily.  30 tablet  0  . Multiple Vitamin (MULTIVITAMIN PO) Take 1 tablet by mouth daily.       . nabumetone (RELAFEN) 750 MG tablet Take 750 mg by mouth as needed (pain).      Marland Kitchen  oxyCODONE-acetaminophen (PERCOCET/ROXICET) 5-325 MG per tablet Take 1 tablet by mouth every 4 (four) hours as needed for pain.      . predniSONE (DELTASONE) 1 MG tablet Take 1 mg by mouth daily.      . Vitamin D, Ergocalciferol, (DRISDOL) 50000 UNITS CAPS Take 50,000 Units by mouth every 7 (seven) days. On thursday        Review of Systems  Constitutional: Negative for fever and chills.  Respiratory: Positive for cough (history of COPD) and shortness of breath.   Cardiovascular: Negative for chest pain and leg swelling.  Gastrointestinal: Negative for nausea, vomiting and abdominal pain.  Neurological: Negative for headaches.  All other systems reviewed and are negative.   Physical Exam   Blood pressure 106/69, pulse 84, temperature 98 F (36.7 C), temperature source Oral, resp. rate 18, height 5\' 3"  (1.6 m), weight 84.732 kg (186 lb 12.8 oz).  Physical Exam  Constitutional: She is oriented to person, place, and time. She appears well-developed and well-nourished. No  distress.  HENT:  Head: Normocephalic.  Neck: Normal range of motion. Neck supple.  Cardiovascular: Normal rate, regular rhythm and normal heart sounds.   Respiratory: Effort normal. No respiratory distress. She has rales (HX of COPD).  Musculoskeletal: Normal range of motion. She exhibits no edema.  Neurological: She is alert and oriented to person, place, and time. She has normal reflexes.  Skin: Skin is warm and dry.    MAU Course  Procedures  Assessment and Plan  Type II DM  Plan: Discharge to home Keep appointment with PCP tomorrow Reviewed diabetes and possible sequelae if not properly controlled Consulted with Dr. Jodi Mourning who agrees with plan of care.  [facility] 12/17/2012, 10:57 AM

## 2012-12-17 NOTE — MAU Provider Note (Signed)
Attestation of Attending Supervision of Advanced Practitioner (CNM/NP): Evaluation and management procedures were performed by the Advanced Practitioner under my supervision and collaboration.  I have reviewed the Advanced Practitioner's note and chart, and I agree with the management and plan.  HARRAWAY-SMITH, Shandy Vi 1:11 PM     

## 2013-01-08 ENCOUNTER — Encounter (INDEPENDENT_AMBULATORY_CARE_PROVIDER_SITE_OTHER): Payer: Medicaid Other | Admitting: General Surgery

## 2013-01-22 ENCOUNTER — Ambulatory Visit (INDEPENDENT_AMBULATORY_CARE_PROVIDER_SITE_OTHER): Payer: Medicaid Other | Admitting: Surgery

## 2013-01-22 ENCOUNTER — Encounter (INDEPENDENT_AMBULATORY_CARE_PROVIDER_SITE_OTHER): Payer: Self-pay | Admitting: Surgery

## 2013-01-22 VITALS — BP 144/86 | HR 84 | Temp 96.8°F | Resp 25 | Ht 64.0 in | Wt 188.0 lb

## 2013-01-22 DIAGNOSIS — R109 Unspecified abdominal pain: Secondary | ICD-10-CM

## 2013-01-22 MED ORDER — OXYCODONE-ACETAMINOPHEN 5-325 MG PO TABS: 1.0000 | ORAL_TABLET | ORAL | Status: DC | PRN

## 2013-01-22 NOTE — Patient Instructions (Signed)
Apply neosporin to the area of drainage

## 2013-01-22 NOTE — Progress Notes (Signed)
Benson Norway 57 y.o.  Body mass index is 32.25 kg/(m^2).  Patient Active Problem List   Diagnosis Date Noted  . GERD (gastroesophageal reflux disease)   . Anxiety   . Bronchitis   . Skin lesion of right arm 10/20/2011  . Post op infection 03/24/2011  . Incisional hernia 01/20/2011  . Wound infection after surgery 09/09/2010    Allergies  Allergen Reactions  . Penicillins Itching and Rash    All over the body.  . Contrast Media [Iodinated Diagnostic Agents] Nausea And Vomiting  . Doxycycline   . Erythromycin   . Metronidazole Hives and Rash    Past Surgical History  Procedure Laterality Date  . Abdominal hysterectomy    . Spine surgery    . Vulvar excision  2006    Cancer in situ  . Oophorectomy  2006    cervical cancer  . Hernia repair      incisional/ventral hernia repair  . Rectovaginal fistula closure  2012  . Colectomy     GARBA,LAWAL, MD No diagnosis found.  Urgent office visit for Ms. Daphine Deutscher. She complained of spontaneous drainage from the lower portion of her lower midline incision. She has about a 2 mm area at the is probably where it drained although I cannot pass a Q-tip swab. There is some chaffing around this but no cellulitis or frank skin infection. I thought we would hold off on antibiotics for the moment. She wants something for pain and I will prescribe her some Percocet since hydrocodone is not helping her much anymore. Her get her back to see Dr. Johna Sheriff next week and it sounds like she has a very complicated mesh history with a probable chronic infection. Matt B. Daphine Deutscher, MD, [facility] [phone] beeper [phone]  01/22/2013 4:54 PM

## 2013-02-03 ENCOUNTER — Telehealth (INDEPENDENT_AMBULATORY_CARE_PROVIDER_SITE_OTHER): Payer: Self-pay

## 2013-02-03 NOTE — Telephone Encounter (Signed)
Called and left message for patient with follow up appointment on 02/19/13 @ 3:00pm w/Dr. Johna Sheriff.

## 2013-02-19 ENCOUNTER — Encounter (INDEPENDENT_AMBULATORY_CARE_PROVIDER_SITE_OTHER): Payer: Medicaid Other | Admitting: General Surgery

## 2013-03-19 ENCOUNTER — Encounter (INDEPENDENT_AMBULATORY_CARE_PROVIDER_SITE_OTHER): Payer: Medicaid Other | Admitting: General Surgery

## 2013-05-07 ENCOUNTER — Ambulatory Visit (INDEPENDENT_AMBULATORY_CARE_PROVIDER_SITE_OTHER): Payer: Medicaid Other | Admitting: General Surgery

## 2013-05-07 ENCOUNTER — Encounter (INDEPENDENT_AMBULATORY_CARE_PROVIDER_SITE_OTHER): Payer: Self-pay | Admitting: General Surgery

## 2013-05-07 VITALS — BP 134/86 | HR 78 | Temp 98.3°F | Resp 18 | Ht 64.0 in | Wt 184.8 lb

## 2013-05-07 DIAGNOSIS — T8140XA Infection following a procedure, unspecified, initial encounter: Secondary | ICD-10-CM

## 2013-05-07 DIAGNOSIS — T8149XA Infection following a procedure, other surgical site, initial encounter: Secondary | ICD-10-CM

## 2013-05-07 DIAGNOSIS — K439 Ventral hernia without obstruction or gangrene: Secondary | ICD-10-CM

## 2013-05-07 NOTE — Progress Notes (Signed)
Chief complaint: Abdominal pain wound drainage  History: Patient returns to the office.. She has a history of sigmoid colectomy for colovaginal fistula. She had had a previous low normal incisional hernia repair with mesh. 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 We had previously done a wound -- debridement but she again healed with a chronic f fistula tract. This has persisted.  She has had a CT scan which I had reviewed several months ago. She has 3 moderate-sized ventral hernias in the midline in her upper mid and lower abdomen. No incarceration. She has a chronic sinus tract down to the suprapubic area but no large undrained collection of fluid. She comes in today just for routine followup. She has not had any pain in her abdomen or any swelling or redness or increased drainage. She changes 1 Band-Aid over the sinus tract of daily and it really does not bother her. Exam: BP 134/86  Pulse 78  Temp(Src) 98.3 F (36.8 C) (Oral)  Resp 18  Ht 5\' 4"  (1.626 m)  Wt 184 lb 12.8 oz (83.825 kg)  BMI 31.71 kg/m2 General: Flat affect appears chronically depressed Abdomen: There are hernias palpable in the midabdomen umbilicus and upper abdomen. There is a tiny open sinus tract suprapubically with a little bit of purulent drainage but no particular tenderness or swelling or erythema or mass.  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  I don't believe that there has been any significant change in her condition. I don't see any evidence of cellulitis or abscess. She is now on prednisone due to some chronic respiratory difficulties and has been unable to stop smoking to date. We have previously discussed that this would take an extensive operation with removal of mesh and hernia repair with biologic material and I would not do this while she is still smoking. She is minimally symptomatic. I will see her for  another check in about 6 months. I encouraged her to continue any efforts to reduce or quit smoking.

## 2013-05-07 NOTE — Patient Instructions (Signed)
Call us as needed for increasing pain or redness or excessive drainage

## 2013-10-06 ENCOUNTER — Encounter (INDEPENDENT_AMBULATORY_CARE_PROVIDER_SITE_OTHER): Payer: Self-pay | Admitting: General Surgery

## 2013-10-29 ENCOUNTER — Other Ambulatory Visit: Payer: Self-pay | Admitting: Internal Medicine

## 2013-10-29 DIAGNOSIS — E041 Nontoxic single thyroid nodule: Secondary | ICD-10-CM

## 2013-11-05 ENCOUNTER — Other Ambulatory Visit (HOSPITAL_COMMUNITY)
Admission: RE | Admit: 2013-11-05 | Discharge: 2013-11-05 | Disposition: A | Discharge status: Home / Self Care | Payer: Medicaid Other | Source: Ambulatory Visit | Attending: Interventional Radiology | Admitting: Interventional Radiology

## 2013-11-05 ENCOUNTER — Ambulatory Visit
Admission: RE | Admit: 2013-11-05 | Discharge: 2013-11-05 | Disposition: A | Discharge status: Home / Self Care | Payer: Medicaid Other | Source: Ambulatory Visit | Attending: Internal Medicine | Admitting: Internal Medicine

## 2013-11-05 DIAGNOSIS — E041 Nontoxic single thyroid nodule: Secondary | ICD-10-CM | POA: Diagnosis not present

## 2013-11-28 ENCOUNTER — Other Ambulatory Visit (INDEPENDENT_AMBULATORY_CARE_PROVIDER_SITE_OTHER): Payer: Self-pay

## 2013-11-28 DIAGNOSIS — E041 Nontoxic single thyroid nodule: Secondary | ICD-10-CM

## 2013-12-15 ENCOUNTER — Observation Stay (HOSPITAL_COMMUNITY)
Admission: EM | Admit: 2013-12-15 | Discharge: 2013-12-15 | Discharge status: Against Medical Advice | Payer: Medicaid Other | Attending: Emergency Medicine | Admitting: Emergency Medicine

## 2013-12-15 ENCOUNTER — Emergency Department (HOSPITAL_COMMUNITY): Payer: Medicaid Other

## 2013-12-15 ENCOUNTER — Encounter (HOSPITAL_COMMUNITY): Payer: Self-pay | Admitting: *Deleted

## 2013-12-15 DIAGNOSIS — Z794 Long term (current) use of insulin: Secondary | ICD-10-CM | POA: Insufficient documentation

## 2013-12-15 DIAGNOSIS — R079 Chest pain, unspecified: Secondary | ICD-10-CM | POA: Diagnosis not present

## 2013-12-15 DIAGNOSIS — J449 Chronic obstructive pulmonary disease, unspecified: Secondary | ICD-10-CM

## 2013-12-15 DIAGNOSIS — J42 Unspecified chronic bronchitis: Secondary | ICD-10-CM | POA: Insufficient documentation

## 2013-12-15 DIAGNOSIS — R0602 Shortness of breath: Secondary | ICD-10-CM | POA: Insufficient documentation

## 2013-12-15 DIAGNOSIS — J45909 Unspecified asthma, uncomplicated: Secondary | ICD-10-CM | POA: Diagnosis not present

## 2013-12-15 DIAGNOSIS — I10 Essential (primary) hypertension: Secondary | ICD-10-CM | POA: Diagnosis not present

## 2013-12-15 DIAGNOSIS — Z79899 Other long term (current) drug therapy: Secondary | ICD-10-CM | POA: Insufficient documentation

## 2013-12-15 DIAGNOSIS — Z79891 Long term (current) use of opiate analgesic: Secondary | ICD-10-CM | POA: Diagnosis not present

## 2013-12-15 DIAGNOSIS — Z7952 Long term (current) use of systemic steroids: Secondary | ICD-10-CM | POA: Insufficient documentation

## 2013-12-15 LAB — BASIC METABOLIC PANEL
Anion gap: 15 (ref 5–15)
BUN: 7 mg/dL (ref 6–23)
CALCIUM: 8.9 mg/dL (ref 8.4–10.5)
CO2: 25 mEq/L (ref 19–32)
CREATININE: 0.67 mg/dL (ref 0.50–1.10)
Chloride: 96 mEq/L (ref 96–112)
GFR calc Af Amer: >90 mL/min (ref >90)
Glucose, Bld: 334 mg/dL — ABNORMAL HIGH (ref 70–99)
Potassium: 3.9 mEq/L (ref 3.7–5.3)
Sodium: 136 mEq/L — ABNORMAL LOW (ref 137–147)

## 2013-12-15 LAB — CBC
HEMATOCRIT: 43.4 % (ref 36.0–46.0)
Hemoglobin: 14.6 g/dL (ref 12.0–15.0)
MCH: 30.1 pg (ref 26.0–34.0)
MCHC: 33.6 g/dL (ref 30.0–36.0)
MCV: 89.5 fL (ref 78.0–100.0)
Platelets: 231 10*3/uL (ref 150–400)
RBC: 4.85 MIL/uL (ref 3.87–5.11)
RDW: 12.8 % (ref 11.5–15.5)
WBC: 9.4 10*3/uL (ref 4.0–10.5)

## 2013-12-15 LAB — I-STAT TROPONIN, ED: Troponin i, poc: 0.03 ng/mL (ref 0.00–0.08)

## 2013-12-15 MED ORDER — ASPIRIN 325 MG PO TABS
325.0000 mg | ORAL_TABLET | ORAL | Status: AC
  Administered 2013-12-15: 325 mg via ORAL
  Filled 2013-12-15: qty 1

## 2013-12-15 MED ORDER — ASPIRIN 81 MG PO CHEW: 324.0000 mg | CHEWABLE_TABLET | Freq: Once | ORAL | Status: DC

## 2013-12-15 MED ORDER — IPRATROPIUM-ALBUTEROL 0.5-2.5 (3) MG/3ML IN SOLN
3.0000 mL | Freq: Once | RESPIRATORY_TRACT | Status: AC
  Administered 2013-12-15: 3 mL via RESPIRATORY_TRACT
  Filled 2013-12-15: qty 3

## 2013-12-15 NOTE — ED Notes (Signed)
Patient states that she wants to leave and go home despite risks stated by doctor. Physician informed.

## 2013-12-15 NOTE — ED Provider Notes (Signed)
Date: 12/15/2013  Rate: 104  Rhythm: sinus tachycardia  QRS Axis: normal  Intervals: normal  ST/T Wave abnormalities: nonspecific ST changes  Conduction Disutrbances:none     Sharyon Cable, MD 12/15/13 1237

## 2013-12-15 NOTE — ED Notes (Signed)
Lt. Sided chest pain, radiating down left arm, and numbness.  Took vicodin an hr. Ago for the pain and no relief.  Pt. Drowsy in the ED.

## 2013-12-15 NOTE — ED Provider Notes (Signed)
CSN: [id number]     Arrival date & time 12/15/13  1209 History   First MD Initiated Contact with Patient 12/15/13 1237     Chief Complaint  Patient presents with  . Chest Pain     Patient is a 58 y.o. female presenting with chest pain. The history is provided by the patient.  Chest Pain Pain location:  L chest Pain quality: sharp   Pain radiates to:  L arm Pain severity:  Moderate Onset quality:  Sudden Duration:  1 hour Timing:  Constant Progression:  Unchanged Chronicity:  New Relieved by:  Nothing Worsened by:  Deep breathing Associated symptoms: cough and shortness of breath   Associated symptoms: no fever and not vomiting    Patient presents for chest pain She [place] she was at the [place], and had onset of left CP that radiated into her left arm She reports recent cough and she thinks may have triggered her CP She reports CP is worse with deep breathing   She denies h/o CAD/DVT/PE   Past Medical History  Diagnosis Date  . Depression   . Arthritis   . 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 ADD (attention deficit disorder with hyperactivity)   . Hypertension   . COPD (chronic obstructive pulmonary disease)   . Asthma   . GERD (gastroesophageal reflux disease)   . Anxiety   . Bronchitis   . Gestational diabetes    Past Surgical History  Procedure Laterality Date  . Abdominal hysterectomy    . Spine surgery    . Vulvar excision  2006    Cancer in situ  . Oophorectomy  2006    cervical cancer  . Hernia repair      incisional/ventral hernia repair  . Rectovaginal fistula closure  2012  . Colectomy     Family History  Problem Relation Age of Onset  . COPD Father   . Cancer Father     lung  . Cancer Paternal Aunt     lung   History  Substance Use Topics  . Smoking status: Current Every Day Smoker -- 0.50 packs/day    Types: Cigarettes  . Smokeless tobacco: Never Used  . Alcohol Use: No   OB History    No data available     Review of Systems   Constitutional: Negative for fever.  Respiratory: Positive for cough and shortness of breath.   Cardiovascular: Positive for chest pain.  Gastrointestinal: Negative for vomiting.  Musculoskeletal:       Chronic neck pain   Skin: Positive for wound.       Chronic abdominal wound   All other systems reviewed and are negative.     Allergies  Penicillins; Contrast media; Doxycycline; Erythromycin; and Metronidazole  Home Medications   Prior to Admission medications   Medication Sig Start Date End Date Taking? Authorizing Provider  albuterol (PROVENTIL HFA;VENTOLIN HFA) 108 (90 BASE) MCG/ACT inhaler Inhale 2 puffs into the lungs 2 (two) times daily as needed for wheezing.   Yes Historical Provider, MD  alprazolam Duanne Moron) 2 MG tablet Take 1 mg by mouth at bedtime as needed for sleep or anxiety (Can take up to 3 thimes daily prn). Patient takes 1/2 tablet by mouth four times a day   Yes Historical Provider, MD  amphetamine-dextroamphetamine (ADDERALL) 10 MG tablet Take 10 mg by mouth daily.   Yes Historical Provider, MD  ARIPiprazole (ABILIFY) 5 MG tablet Take 5 mg by mouth daily.    Yes Historical Provider,  MD  cyclobenzaprine (FLEXERIL) 10 MG tablet Take 10 mg by mouth at bedtime.   Yes Historical Provider, MD  doxepin (SINEQUAN) 25 MG capsule Take 25 mg by mouth at bedtime as needed (for sleep).   Yes Historical Provider, MD  glipiZIDE (GLUCOTROL) 10 MG tablet Take 10 mg by mouth daily before breakfast.   Yes Historical Provider, MD  hydrochlorothiazide (HYDRODIURIL) 25 MG tablet Take 1 tablet (25 mg total) by mouth daily. 09/23/12  Yes Gertie Exon, MD  HYDROcodone-acetaminophen (NORCO/VICODIN) 5-325 MG per tablet Take 1 tablet by mouth every 6 (six) hours as needed for moderate pain.   Yes Historical Provider, MD  insulin glargine (LANTUS) 100 UNIT/ML injection Inject 10 Units into the skin at bedtime.   Yes Historical Provider, MD  metFORMIN (GLUCOPHAGE) 500 MG tablet Take 500 mg by  mouth 2 (two) times daily with a meal.   Yes Historical Provider, MD  Mirtazapine (REMERON PO) Take 45 mg by mouth at bedtime.    Yes Historical Provider, MD  predniSONE (DELTASONE) 10 MG tablet Take 10 mg by mouth daily with breakfast.   Yes Historical Provider, MD  Vilazodone HCl (VIIBRYD) 40 MG TABS Take 40 mg by mouth daily.   Yes Historical Provider, MD  Vitamin D, Ergocalciferol, (DRISDOL) 50000 UNITS CAPS Take 50,000 Units by mouth every 7 (seven) days. On thursday   Yes Historical Provider, MD  atorvastatin (LIPITOR) 20 MG tablet Take 20 mg by mouth daily at 6 PM.    Historical Provider, MD  FLUoxetine (PROZAC) 20 MG capsule Take 40 mg by mouth daily.     Historical Provider, MD  Multiple Vitamin (MULTIVITAMIN PO) Take 1 tablet by mouth daily.     Historical Provider, MD  nabumetone (RELAFEN) 750 MG tablet Take 750 mg by mouth as needed (pain).    Historical Provider, MD  oxyCODONE-acetaminophen (PERCOCET/ROXICET) 5-325 MG per tablet Take 1 tablet by mouth every 4 (four) hours as needed. 01/22/13   Pedro Earls, MD  predniSONE (DELTASONE) 1 MG tablet Take 1 mg by mouth daily.    Historical Provider, MD   BP 182/95 mmHg  Pulse 102  Temp(Src) 98 F (36.7 C) (Oral)  Resp 18  Ht 5\' 4"  (1.626 m)  Wt 208 lb (94.348 kg)  BMI 35.69 kg/m2  SpO2 92% Physical Exam CONSTITUTIONAL: Well developed/well nourished HEAD: Normocephalic/atraumatic EYES: EOMI/PERRL ENMT: Mucous membranes moist NECK: supple no meningeal signs SPINE:entire spine nontender CV: S1/S2 noted, no murmurs/rubs/gallops noted Chest - diffuse left sided chest wall tenderness LUNGS: wheezing bilaterally ABDOMEN: soft, nontender, no rebound or guarding She has chronic wound/drainage to lower abdomen that she reports is unchanged.  No focal tenderness to abdomen GU:no cva tenderness NEURO: Pt is awake/alert, moves all extremitiesx4 EXTREMITIES: pulses normal, full ROM SKIN: warm, color normal PSYCH: no abnormalities  of mood noted  ED Course  Procedures  1:27 PM Pt with cough/wheeze will give neb treatment For her CP, she has some reproducible component, though also report CP into left arm Labs/imaging pending at this time 3:35 PM Pt feels improved Given h/o DM/HTN, and she also reported chest pain that radiated into left arm, concern for possible ACS I doubt PE at this time Would recommend observation admit overnight for cardiac monitoring Pt agreeable with plan D/w dr Eliseo Squires with [facility] will admit  Labs Review Labs Reviewed  BASIC METABOLIC PANEL - Abnormal; Notable for the following:    Sodium 136 (*)    Glucose, Bld 334 (*)  All other components within normal limits  CBC  URINALYSIS, ROUTINE W REFLEX MICROSCOPIC  I-STAT TROPOININ, ED    Imaging Review Dg Chest Portable 1 View  12/15/2013   CLINICAL DATA:  Left-sided chest pain with upper extremity radicular symptoms.  EXAM: PORTABLE CHEST - 1 VIEW  COMPARISON:  May 24, 2010  FINDINGS: There is no edema or consolidation. The heart size and pulmonary vascularity are normal. No adenopathy. There is osteoarthritic change in the left shoulder. There is postoperative change in the lower cervical spine region.  IMPRESSION: No edema or consolidation.   Electronically Signed   By: Lowella Grip M.D.   On: 12/15/2013 13:27      MDM   Final diagnoses:  Chest pain  Chest pain, rule out acute myocardial infarction  Chronic obstructive pulmonary disease, unspecified COPD, unspecified chronic bronchitis type    Nursing notes including past medical history and social history reviewed and considered in documentation Labs/vital reviewed and considered     Sharyon Cable, MD 12/15/13 1537

## 2013-12-15 NOTE — ED Provider Notes (Signed)
Pt now requests d/c home I discussed risk of death/disability of leaving against medical advice and the patient accepts these risks.  The patient is awake/alert able to make decisions, and does not appear intoxicated Patient discharged against medical advice.    Sharyon Cable, MD 12/15/13 [phone]

## 2013-12-21 ENCOUNTER — Emergency Department (HOSPITAL_COMMUNITY): Payer: Medicaid Other

## 2013-12-21 ENCOUNTER — Encounter (HOSPITAL_COMMUNITY): Payer: Self-pay | Admitting: *Deleted

## 2013-12-21 ENCOUNTER — Emergency Department (HOSPITAL_COMMUNITY)
Admission: EM | Admit: 2013-12-21 | Discharge: 2013-12-21 | Disposition: A | Discharge status: Home / Self Care | Payer: Medicaid Other | Attending: Emergency Medicine | Admitting: Emergency Medicine

## 2013-12-21 DIAGNOSIS — Z720 Tobacco use: Secondary | ICD-10-CM | POA: Insufficient documentation

## 2013-12-21 DIAGNOSIS — F909 Attention-deficit hyperactivity disorder, unspecified type: Secondary | ICD-10-CM | POA: Diagnosis not present

## 2013-12-21 DIAGNOSIS — Z79899 Other long term (current) drug therapy: Secondary | ICD-10-CM | POA: Diagnosis not present

## 2013-12-21 DIAGNOSIS — F329 Major depressive disorder, single episode, unspecified: Secondary | ICD-10-CM | POA: Insufficient documentation

## 2013-12-21 DIAGNOSIS — F419 Anxiety disorder, unspecified: Secondary | ICD-10-CM | POA: Diagnosis not present

## 2013-12-21 DIAGNOSIS — Z8742 Personal history of other diseases of the female genital tract: Secondary | ICD-10-CM | POA: Insufficient documentation

## 2013-12-21 DIAGNOSIS — R0789 Other chest pain: Secondary | ICD-10-CM | POA: Diagnosis not present

## 2013-12-21 DIAGNOSIS — M1990 Unspecified osteoarthritis, unspecified site: Secondary | ICD-10-CM | POA: Diagnosis not present

## 2013-12-21 DIAGNOSIS — I10 Essential (primary) hypertension: Secondary | ICD-10-CM | POA: Diagnosis not present

## 2013-12-21 DIAGNOSIS — Z8719 Personal history of other diseases of the digestive system: Secondary | ICD-10-CM | POA: Diagnosis not present

## 2013-12-21 DIAGNOSIS — R05 Cough: Secondary | ICD-10-CM | POA: Insufficient documentation

## 2013-12-21 DIAGNOSIS — J441 Chronic obstructive pulmonary disease with (acute) exacerbation: Secondary | ICD-10-CM | POA: Diagnosis not present

## 2013-12-21 DIAGNOSIS — Z880 Allergy status to penicillin: Secondary | ICD-10-CM | POA: Diagnosis not present

## 2013-12-21 DIAGNOSIS — Z8632 Personal history of gestational diabetes: Secondary | ICD-10-CM | POA: Diagnosis not present

## 2013-12-21 DIAGNOSIS — R079 Chest pain, unspecified: Secondary | ICD-10-CM | POA: Diagnosis present

## 2013-12-21 DIAGNOSIS — Z794 Long term (current) use of insulin: Secondary | ICD-10-CM | POA: Insufficient documentation

## 2013-12-21 DIAGNOSIS — R059 Cough, unspecified: Secondary | ICD-10-CM

## 2013-12-21 DIAGNOSIS — Z7952 Long term (current) use of systemic steroids: Secondary | ICD-10-CM | POA: Diagnosis not present

## 2013-12-21 DIAGNOSIS — R062 Wheezing: Secondary | ICD-10-CM | POA: Diagnosis not present

## 2013-12-21 LAB — TROPONIN I: Troponin I: <0.3 ng/mL (ref <0.30)

## 2013-12-21 LAB — CBC
HCT: 42.5 % (ref 36.0–46.0)
Hemoglobin: 14 g/dL (ref 12.0–15.0)
MCH: 29.3 pg (ref 26.0–34.0)
MCHC: 32.9 g/dL (ref 30.0–36.0)
MCV: 88.9 fL (ref 78.0–100.0)
PLATELETS: 250 10*3/uL (ref 150–400)
RBC: 4.78 MIL/uL (ref 3.87–5.11)
RDW: 12.8 % (ref 11.5–15.5)
WBC: 9.1 10*3/uL (ref 4.0–10.5)

## 2013-12-21 LAB — BASIC METABOLIC PANEL
Anion gap: 16 — ABNORMAL HIGH (ref 5–15)
BUN: 8 mg/dL (ref 6–23)
CO2: 24 mEq/L (ref 19–32)
Calcium: 8.9 mg/dL (ref 8.4–10.5)
Chloride: 93 mEq/L — ABNORMAL LOW (ref 96–112)
Creatinine, Ser: 0.6 mg/dL (ref 0.50–1.10)
Glucose, Bld: 417 mg/dL — ABNORMAL HIGH (ref 70–99)
Potassium: 3.6 mEq/L — ABNORMAL LOW (ref 3.7–5.3)
SODIUM: 133 meq/L — AB (ref 137–147)

## 2013-12-21 LAB — I-STAT TROPONIN, ED: TROPONIN I, POC: 0.01 ng/mL (ref 0.00–0.08)

## 2013-12-21 LAB — CBG MONITORING, ED: Glucose-Capillary: 234 mg/dL — ABNORMAL HIGH (ref 70–99)

## 2013-12-21 MED ORDER — LORAZEPAM 2 MG/ML IJ SOLN
1.0000 mg | Freq: Once | INTRAMUSCULAR | Status: AC
  Administered 2013-12-21: 1 mg via INTRAVENOUS
  Filled 2013-12-21: qty 1

## 2013-12-21 MED ORDER — HYDROCODONE-ACETAMINOPHEN 5-325 MG PO TABS
2.0000 | ORAL_TABLET | Freq: Once | ORAL | Status: AC
  Administered 2013-12-21: 2 via ORAL
  Filled 2013-12-21: qty 2

## 2013-12-21 MED ORDER — FAMOTIDINE 20 MG PO TABS
20.0000 mg | ORAL_TABLET | Freq: Once | ORAL | Status: AC
  Administered 2013-12-21: 20 mg via ORAL
  Filled 2013-12-21: qty 1

## 2013-12-21 MED ORDER — NITROGLYCERIN 0.4 MG SL SUBL: 0.4000 mg | SUBLINGUAL_TABLET | SUBLINGUAL | Status: DC | PRN

## 2013-12-21 MED ORDER — GI COCKTAIL ~~LOC~~
30.0000 mL | Freq: Once | ORAL | Status: AC
  Administered 2013-12-21: 30 mL via ORAL
  Filled 2013-12-21: qty 30

## 2013-12-21 MED ORDER — SODIUM CHLORIDE 0.9 % IV BOLUS (SEPSIS)
1000.0000 mL | Freq: Once | INTRAVENOUS | Status: AC
  Administered 2013-12-21: 1000 mL via INTRAVENOUS

## 2013-12-21 MED ORDER — ALBUTEROL SULFATE (2.5 MG/3ML) 0.083% IN NEBU
5.0000 mg | INHALATION_SOLUTION | Freq: Once | RESPIRATORY_TRACT | Status: AC
  Administered 2013-12-21: 5 mg via RESPIRATORY_TRACT
  Filled 2013-12-21: qty 6

## 2013-12-21 MED ORDER — IPRATROPIUM BROMIDE 0.02 % IN SOLN
0.5000 mg | Freq: Once | RESPIRATORY_TRACT | Status: AC
  Administered 2013-12-21: 0.5 mg via RESPIRATORY_TRACT
  Filled 2013-12-21: qty 2.5

## 2013-12-21 MED ORDER — NICOTINE 21 MG/24HR TD PT24
21.0000 mg | MEDICATED_PATCH | Freq: Once | TRANSDERMAL | Status: DC
  Administered 2013-12-21: 21 mg via TRANSDERMAL
  Filled 2013-12-21: qty 1

## 2013-12-21 MED ORDER — INSULIN ASPART 100 UNIT/ML ~~LOC~~ SOLN
12.0000 [IU] | Freq: Once | SUBCUTANEOUS | Status: AC
  Administered 2013-12-21: 12 [IU] via SUBCUTANEOUS

## 2013-12-21 NOTE — ED Notes (Signed)
Per pt request, message was left for brother with our contact info

## 2013-12-21 NOTE — ED Provider Notes (Signed)
CSN: [id number]     Arrival date & time 12/21/13  0715 History   First MD Initiated Contact with Patient 12/21/13 [phone]     Chief Complaint  Patient presents with  . Chest Pain     (Consider location/radiation/quality/duration/timing/severity/associated sxs/prior Treatment) Patient is a 58 y.o. female presenting with chest pain. The history is provided by the patient.  Chest Pain Associated symptoms: cough   Associated symptoms: no abdominal pain, no back pain, no fever, no headache, no shortness of breath and not vomiting   pt c/o cp for the past several days. Lower sternal, dull pain, constant. At rest. No radiation. Pt denies associated nv, diaphoresis or sob. No exertional cp or discomfort. No pleuritic chest pain. Pt denies heartburn, ?hx gerd. Denies hx cad or fam hx premature cad. +smoker. Occasional non prod cough. No sore throat or runny nose. Denies leg pain or swelling. Pt does also c/o bilateral foot burning pain for the past couple days. Does not hx 'neuropathy'.      Past Medical History  Diagnosis Date  . Depression   . Arthritis   . 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 ADD (attention deficit disorder with hyperactivity)   . Hypertension   . COPD (chronic obstructive pulmonary disease)   . Asthma   . GERD (gastroesophageal reflux disease)   . Anxiety   . Bronchitis   . Gestational diabetes    Past Surgical History  Procedure Laterality Date  . Abdominal hysterectomy    . Spine surgery    . Vulvar excision  2006    Cancer in situ  . Oophorectomy  2006    cervical cancer  . Hernia repair      incisional/ventral hernia repair  . Rectovaginal fistula closure  2012  . Colectomy     Family History  Problem Relation Age of Onset  . COPD Father   . Cancer Father     lung  . Cancer Paternal Aunt     lung   History  Substance Use Topics  . Smoking status: Current Every Day Smoker -- 0.50 packs/day    Types: Cigarettes  . Smokeless tobacco: Never Used  .  Alcohol Use: No   OB History    No data available     Review of Systems  Constitutional: Negative for fever and chills.  HENT: Negative for sore throat.   Eyes: Negative for redness.  Respiratory: Positive for cough. Negative for shortness of breath.   Cardiovascular: Positive for chest pain. Negative for leg swelling.  Gastrointestinal: Negative for vomiting, abdominal pain and diarrhea.  Genitourinary: Negative for flank pain.  Musculoskeletal: Negative for back pain and neck pain.  Skin: Negative for rash.  Neurological: Negative for headaches.  Hematological: Does not bruise/bleed easily.  Psychiatric/Behavioral: Negative for confusion.      Allergies  Penicillins; Contrast media; Doxycycline; Erythromycin; and Metronidazole  Home Medications   Prior to Admission medications   Medication Sig Start Date End Date Taking? Authorizing Provider  albuterol (PROVENTIL HFA;VENTOLIN HFA) 108 (90 BASE) MCG/ACT inhaler Inhale 2 puffs into the lungs 2 (two) times daily as needed for wheezing.    Historical Provider, MD  alprazolam Duanne Moron) 2 MG tablet Take 1 mg by mouth at bedtime as needed for sleep or anxiety (Can take up to 3 thimes daily prn). Patient takes 1/2 tablet by mouth four times a day    Historical Provider, MD  amphetamine-dextroamphetamine (ADDERALL) 10 MG tablet Take 10 mg by mouth daily.  Historical Provider, MD  ARIPiprazole (ABILIFY) 5 MG tablet Take 5 mg by mouth daily.     Historical Provider, MD  atorvastatin (LIPITOR) 20 MG tablet Take 20 mg by mouth daily at 6 PM.    Historical Provider, MD  cyclobenzaprine (FLEXERIL) 10 MG tablet Take 10 mg by mouth at bedtime.    Historical Provider, MD  doxepin (SINEQUAN) 25 MG capsule Take 25 mg by mouth at bedtime as needed (for sleep).    Historical Provider, MD  FLUoxetine (PROZAC) 20 MG capsule Take 40 mg by mouth daily.     Historical Provider, MD  glipiZIDE (GLUCOTROL) 10 MG tablet Take 10 mg by mouth daily before  breakfast.    Historical Provider, MD  hydrochlorothiazide (HYDRODIURIL) 25 MG tablet Take 1 tablet (25 mg total) by mouth daily. 09/23/12   Gertie Exon, MD  HYDROcodone-acetaminophen (NORCO/VICODIN) 5-325 MG per tablet Take 1 tablet by mouth every 6 (six) hours as needed for moderate pain.    Historical Provider, MD  insulin glargine (LANTUS) 100 UNIT/ML injection Inject 10 Units into the skin at bedtime.    Historical Provider, MD  metFORMIN (GLUCOPHAGE) 500 MG tablet Take 500 mg by mouth 2 (two) times daily with a meal.    Historical Provider, MD  Mirtazapine (REMERON PO) Take 45 mg by mouth at bedtime.     Historical Provider, MD  Multiple Vitamin (MULTIVITAMIN PO) Take 1 tablet by mouth daily.     Historical Provider, MD  nabumetone (RELAFEN) 750 MG tablet Take 750 mg by mouth as needed (pain).    Historical Provider, MD  oxyCODONE-acetaminophen (PERCOCET/ROXICET) 5-325 MG per tablet Take 1 tablet by mouth every 4 (four) hours as needed. 01/22/13   Pedro Earls, MD  predniSONE (DELTASONE) 1 MG tablet Take 1 mg by mouth daily.    Historical Provider, MD  predniSONE (DELTASONE) 10 MG tablet Take 10 mg by mouth daily with breakfast.    Historical Provider, MD  Vilazodone HCl (VIIBRYD) 40 MG TABS Take 40 mg by mouth daily.    Historical Provider, MD  Vitamin D, Ergocalciferol, (DRISDOL) 50000 UNITS CAPS Take 50,000 Units by mouth every 7 (seven) days. On thursday    Historical Provider, MD   BP 140/85 mmHg  Pulse 106  Temp(Src) 97.9 F (36.6 C) (Oral)  Ht 5\' 5"  (1.651 m)  Wt 200 lb (90.719 kg)  BMI 33.28 kg/m2  SpO2 91% Physical Exam  Constitutional: She appears well-developed and well-nourished. No distress.  HENT:  Mouth/Throat: Oropharynx is clear and moist.  Eyes: Conjunctivae are normal. No scleral icterus.  Neck: Neck supple. No tracheal deviation present.  Cardiovascular: Normal rate, regular rhythm, normal heart sounds and intact distal pulses.   Pulmonary/Chest: Effort  normal. No respiratory distress. She has wheezes.  Abdominal: Soft. Normal appearance and bowel sounds are normal. She exhibits no distension. There is no tenderness.  Musculoskeletal: She exhibits no edema or tenderness.  bil dp/pt 2+. Normal cap refill distally. bil ankles stable. No focal bony tenderness or sts on exam bil feet and ankles.  No leg or calf pain/swelling/tenderness.   Neurological: She is alert.  Skin: Skin is warm and dry. No rash noted. She is not diaphoretic.  Psychiatric:  Anxious. Pt requesting nicotine patch, states worried about not being able to smoke, stating had to leave ED earlier in week due to need to smoke.   Nursing note and vitals reviewed.   ED Course  Procedures (including critical care time) Labs Review  Results for orders placed or performed during the hospital encounter of 12/21/13  CBC  Result Value Ref Range   WBC 9.1 4.0 - 10.5 K/uL   RBC 4.78 3.87 - 5.11 MIL/uL   Hemoglobin 14.0 12.0 - 15.0 g/dL   HCT 42.5 36.0 - 46.0 %   MCV 88.9 78.0 - 100.0 fL   MCH 29.3 26.0 - 34.0 pg   MCHC 32.9 30.0 - 36.0 g/dL   RDW 12.8 11.5 - 15.5 %   Platelets 250 150 - 400 K/uL  Basic metabolic panel  Result Value Ref Range   Sodium 133 (L) 137 - 147 mEq/L   Potassium 3.6 (L) 3.7 - 5.3 mEq/L   Chloride 93 (L) 96 - 112 mEq/L   CO2 24 19 - 32 mEq/L   Glucose, Bld 417 (H) 70 - 99 mg/dL   BUN 8 6 - 23 mg/dL   Creatinine, Ser 0.60 0.50 - 1.10 mg/dL   Calcium 8.9 8.4 - 10.5 mg/dL   GFR calc non Af Amer >90 >90 mL/min   GFR calc Af Amer >90 >90 mL/min   Anion gap 16 (H) 5 - 15  Troponin I ([facility])  Result Value Ref Range   Troponin I <0.30 <0.30 ng/mL  I-stat troponin, ED (not at [facility])  Result Value Ref Range   Troponin i, poc 0.01 0.00 - 0.08 ng/mL   Comment 3          POC CBG, ED  Result Value Ref Range   Glucose-Capillary 234 (H) 70 - 99 mg/dL   Comment 1 Notify RN    Dg Chest Port 1 View  12/21/2013   CLINICAL DATA:  58 year old female with left chest  and arm pain accompanied by shortness of breath, nausea and bilateral lower extremity numbness. Medical history of COPD, diabetes and hypertension.  EXAM: PORTABLE CHEST - 1 VIEW  COMPARISON:  Prior chest x-ray 12/15/2013  FINDINGS: Cardiac and mediastinal contours remain unchanged in within normal limits. No significant interval change in the appearance of the lungs. No focal airspace consolidation, pleural effusion, pneumothorax or evidence of pulmonary edema. Incompletely imaged anterior cervical fixation hardware at C5-C6. No acute osseous abnormality.  IMPRESSION: No active disease.   Electronically Signed   By: Jacqulynn Cadet M.D.   On: 12/21/2013 08:02   Dg Chest Portable 1 View  12/15/2013   CLINICAL DATA:  Left-sided chest pain with upper extremity radicular symptoms.  EXAM: PORTABLE CHEST - 1 VIEW  COMPARISON:  May 24, 2010  FINDINGS: There is no edema or consolidation. The heart size and pulmonary vascularity are normal. No adenopathy. There is osteoarthritic change in the left shoulder. There is postoperative change in the lower cervical spine region.  IMPRESSION: No edema or consolidation.   Electronically Signed   By: Lowella Grip M.D.   On: 12/15/2013 13:27       EKG Interpretation   Date/Time:  Sunday December 21 2013 07:24:46 EST Ventricular Rate:  103 PR Interval:  139 QRS Duration: 89 QT Interval:  336 QTC Calculation: 440 R Axis:   38 Text Interpretation:  Sinus tachycardia Premature ventricular complexes No  significant change since last tracing Confirmed by Vuong Musa  MD, Lennette Bihari  ([id number]) on 12/21/2013 7:31:22 AM      MDM   Iv ns. Labs. Cxr.  Pt requests nicotine patch - provided.  Reviewed nursing notes and prior charts for additional history.   ?hx gerd, pepcid and gi cocktail po.  Pt requests pain rx, vicodin  po.  Pt w hx anxiety, takes xanax, requests rx, ativan 1 mg iv.  bs high, 1 liter ns bolus, novolog sq.   For wheezing, alb and atrovent  neb.  After symptoms present x several days, trop neg.   Recheck no wheezing, rare non prod cough. No increased wob.  Hr 84, rr 16, pulse ox 96% room air.    Pt denies pain, states breathing at baseline and feels ready for d/c.  She indicates has pcp appt this Wednesday.  Given recent cp, will also provide cardiology referral.   Pt currently appears stable for d/c.       Mirna Mires, MD 12/21/13 1106

## 2013-12-21 NOTE — Discharge Instructions (Signed)
It was our pleasure to provide your ER care today - we hope that you feel better.  Use albuterol inhaler every 4 hours as need if wheezing. No smoking.  Follow up with your doctor this week as planned. For recent chest discomfort, also follow up with cardiology in coming week - call Monday to arrange follow up appointment.  Return to ER if worse, new symptoms, fevers, difficulty breathing, recurrent or persistent chest pain, other concern.  You were given pain medication in the ER - no driving for the next 4 hours.       Chest Pain (Nonspecific) It is often hard to give a specific diagnosis for the cause of chest pain. There is always a chance that your pain could be related to something serious, such as a heart attack or a blood clot in the lungs. You need to follow up with your health care provider for further evaluation. CAUSES   Heartburn.  Pneumonia or bronchitis.  Anxiety or stress.  Inflammation around your heart (pericarditis) or lung (pleuritis or pleurisy).  A blood clot in the lung.  A collapsed lung (pneumothorax). It can develop suddenly on its own (spontaneous pneumothorax) or from trauma to the chest.  Shingles infection (herpes zoster virus). The chest wall is composed of bones, muscles, and cartilage. Any of these can be the source of the pain.  The bones can be bruised by injury.  The muscles or cartilage can be strained by coughing or overwork.  The cartilage can be affected by inflammation and become sore (costochondritis). DIAGNOSIS  Lab tests or other studies may be needed to find the cause of your pain. Your health care provider may have you take a test called an ambulatory electrocardiogram (ECG). An ECG records your heartbeat patterns over a 24-hour period. You may also have other tests, such as:  Transthoracic echocardiogram (TTE). During echocardiography, sound waves are used to evaluate how blood flows through your heart.  Transesophageal  echocardiogram (TEE).  Cardiac monitoring. This allows your health care provider to monitor your heart rate and rhythm in real time.  Holter monitor. This is a portable device that records your heartbeat and can help diagnose heart arrhythmias. It allows your health care provider to track your heart activity for several days, if needed.  Stress tests by exercise or by giving medicine that makes the heart beat faster. TREATMENT   Treatment depends on what may be causing your chest pain. Treatment may include:  Acid blockers for heartburn.  Anti-inflammatory medicine.  Pain medicine for inflammatory conditions.  Antibiotics if an infection is present.  You may be advised to change lifestyle habits. This includes stopping smoking and avoiding alcohol, caffeine, and chocolate.  You may be advised to keep your head raised (elevated) when sleeping. This reduces the chance of acid going backward from your stomach into your esophagus. Most of the time, nonspecific chest pain will improve within 2-3 days with rest and mild pain medicine.  HOME CARE INSTRUCTIONS   If antibiotics were prescribed, take them as directed. Finish them even if you start to feel better.  For the next few days, avoid physical activities that bring on chest pain. Continue physical activities as directed.  Do not use any tobacco products, including cigarettes, chewing tobacco, or electronic cigarettes.  Avoid drinking alcohol.  Only take medicine as directed by your health care provider.  Follow your health care provider's suggestions for further testing if your chest pain does not go away.  Keep any  follow-up appointments you made. If you do not go to an appointment, you could develop lasting (chronic) problems with pain. If there is any problem keeping an appointment, call to reschedule. SEEK MEDICAL CARE IF:  1. Your chest pain does not go away, even after treatment. 2. You have a rash with blisters on your  chest. 3. You have a fever. SEEK IMMEDIATE MEDICAL CARE IF:   You have increased chest pain or pain that spreads to your arm, neck, jaw, back, or abdomen.  You have shortness of breath.  You have an increasing cough, or you cough up blood.  You have severe back or abdominal pain.  You feel nauseous or vomit.  You have severe weakness.  You faint.  You have chills. This is an emergency. Do not wait to see if the pain will go away. Get medical help at once. Call your local emergency services (911 in [place] Do not drive yourself to the hospital. MAKE SURE YOU:   Understand these instructions.  Will watch your condition.  Will get help right away if you are not doing well or get worse. Document Released: 11/09/2004 Document Revised: 02/04/2013 Document Reviewed: 09/05/2007 [facility] Patient Information 2015 [place], [place]. This information is not intended to replace advice given to you by your health care provider. Make sure you discuss any questions you have with your health care provider.     Smoking Hazards Smoking cigarettes is extremely bad for your health. Tobacco smoke has over 200 known poisons in it. It contains the poisonous gases nitrogen oxide and carbon monoxide. There are over 60 chemicals in tobacco smoke that cause cancer. Some of the chemicals found in cigarette smoke include:   Cyanide.   Benzene.   Formaldehyde.   Methanol (wood alcohol).   Acetylene (fuel used in welding torches).   Ammonia.  Even smoking lightly shortens your life expectancy by several years. You can greatly reduce the risk of medical problems for you and your family by stopping now. Smoking is the most preventable cause of death and disease in our society. Within days of quitting smoking, your circulation improves, you decrease the risk of having a heart attack, and your lung capacity improves. There may be some increased phlegm in the first few days after quitting, and it may  take months for your lungs to clear up completely. Quitting for 10 years reduces your risk of developing lung cancer to almost that of a nonsmoker.  WHAT ARE THE RISKS OF SMOKING? Cigarette smokers have an increased risk of many serious medical problems, including:  Lung cancer.   Lung disease (such as pneumonia, bronchitis, and emphysema).   Heart attack and chest pain due to the heart not getting enough oxygen (angina).   Heart disease and peripheral blood vessel disease.   Hypertension.   Stroke.   Oral cancer (cancer of the lip, mouth, or voice box).   Bladder cancer.   Pancreatic cancer.   Cervical cancer.   Pregnancy complications, including premature birth.   Stillbirths and smaller newborn babies, birth defects, and genetic damage to sperm.   Early menopause.   Lower estrogen level for women.   Infertility.   Facial wrinkles.   Blindness.   Increased risk of broken bones (fractures).   Senile dementia.   Stomach ulcers and internal bleeding.   Delayed wound healing and increased risk of complications during surgery. Because of secondhand smoke exposure, children of smokers have an increased risk of the following:   Sudden infant  death syndrome (SIDS).   Respiratory infections.   Lung cancer.   Heart disease.   Ear infections.  WHY IS SMOKING ADDICTIVE? Nicotine is the chemical agent in tobacco that is capable of causing addiction or dependence. When you smoke and inhale, nicotine is absorbed rapidly into the bloodstream through your lungs. Both inhaled and noninhaled nicotine may be addictive.  WHAT ARE THE BENEFITS OF QUITTING?  There are many health benefits to quitting smoking. Some are:   The likelihood of developing cancer and heart disease decreases. Health improvements are seen almost immediately.   Blood pressure, pulse rate, and breathing patterns start returning to normal soon after quitting.   People who  quit may see an improvement in their overall quality of life.  HOW DO YOU QUIT SMOKING? Smoking is an addiction with both physical and psychological effects, and longtime habits can be hard to change. Your health care provider can recommend:  Programs and community resources, which may include group support, education, or therapy.  Replacement products, such as patches, gum, and nasal sprays. Use these products only as directed. Do not replace cigarette smoking with electronic cigarettes (commonly called e-cigarettes). The safety of e-cigarettes is unknown, and some may contain harmful chemicals. FOR MORE INFORMATION 4. American Lung Association: www.lung.org 5. American Cancer Society: www.cancer.org Document Released: 03/09/2004 Document Revised: 11/20/2012 Document Reviewed: 07/22/2012 [facility] Patient Information 2015 [place], [place]. This information is not intended to replace advice given to you by your health care provider. Make sure you discuss any questions you have with your health care provider.   Chronic Obstructive Pulmonary Disease Chronic obstructive pulmonary disease (COPD) is a common lung condition in which airflow from the lungs is limited. COPD is a general term that can be used to describe many different lung problems that limit airflow, including both chronic bronchitis and emphysema. If you have COPD, your lung function will probably never return to normal, but there are measures you can take to improve lung function and make yourself feel better.  CAUSES   Smoking (common).   Exposure to secondhand smoke.   Genetic problems.  Chronic inflammatory lung diseases or recurrent infections. SYMPTOMS   Shortness of breath, especially with physical activity.   Deep, persistent (chronic) cough with a large amount of thick mucus.   Wheezing.   Rapid breaths (tachypnea).   Gray or bluish discoloration (cyanosis) of the skin, especially in fingers, toes, or  lips.   Fatigue.   Weight loss.   Frequent infections or episodes when breathing symptoms become much worse (exacerbations).   Chest tightness. DIAGNOSIS  Your health care provider will take a medical history and perform a physical examination to make the initial diagnosis. Additional tests for COPD may include:   Lung (pulmonary) function tests.  Chest X-ray.  CT scan.  Blood tests. TREATMENT  Treatment available to help you feel better when you have COPD includes:   Inhaler and nebulizer medicines. These help manage the symptoms of COPD and make your breathing more comfortable.  Supplemental oxygen. Supplemental oxygen is only helpful if you have a low oxygen level in your blood.   Exercise and physical activity. These are beneficial for nearly all people with COPD. Some people may also benefit from a pulmonary rehabilitation program. HOME CARE INSTRUCTIONS   Take all medicines (inhaled or pills) as directed by your health care provider.  Avoid over-the-counter medicines or cough syrups that dry up your airway (such as antihistamines) and slow down the elimination of secretions unless  instructed otherwise by your health care provider.   If you are a smoker, the most important thing that you can do is stop smoking. Continuing to smoke will cause further lung damage and breathing trouble. Ask your health care provider for help with quitting smoking. He or she can direct you to community resources or hospitals that provide support.  Avoid exposure to irritants such as smoke, chemicals, and fumes that aggravate your breathing.  Use oxygen therapy and pulmonary rehabilitation if directed by your health care provider. If you require home oxygen therapy, ask your health care provider whether you should purchase a pulse oximeter to measure your oxygen level at home.   Avoid contact with individuals who have a contagious illness.  Avoid extreme temperature and humidity  changes.  Eat healthy foods. Eating smaller, more frequent meals and resting before meals may help you maintain your strength.  Stay active, but balance activity with periods of rest. Exercise and physical activity will help you maintain your ability to do things you want to do.  Preventing infection and hospitalization is very important when you have COPD. Make sure to receive all the vaccines your health care provider recommends, especially the pneumococcal and influenza vaccines. Ask your health care provider whether you need a pneumonia vaccine.  Learn and use relaxation techniques to manage stress.  Learn and use controlled breathing techniques as directed by your health care provider. Controlled breathing techniques include:   Pursed lip breathing. Start by breathing in (inhaling) through your nose for 1 second. Then, purse your lips as if you were going to whistle and breathe out (exhale) through the pursed lips for 2 seconds.   Diaphragmatic breathing. Start by putting one hand on your abdomen just above your waist. Inhale slowly through your nose. The hand on your abdomen should move out. Then purse your lips and exhale slowly. You should be able to feel the hand on your abdomen moving in as you exhale.   Learn and use controlled coughing to clear mucus from your lungs. Controlled coughing is a series of short, progressive coughs. The steps of controlled coughing are:  6. Lean your head slightly forward.  7. Breathe in deeply using diaphragmatic breathing.  8. Try to hold your breath for 3 seconds.  9. Keep your mouth slightly open while coughing twice.  10. Spit any mucus out into a tissue.  11. Rest and repeat the steps once or twice as needed. SEEK MEDICAL CARE IF:   You are coughing up more mucus than usual.   There is a change in the color or thickness of your mucus.   Your breathing is more labored than usual.   Your breathing is faster than usual.  SEEK  IMMEDIATE MEDICAL CARE IF:   You have shortness of breath while you are resting.   You have shortness of breath that prevents you from:  Being able to talk.   Performing your usual physical activities.   You have chest pain lasting longer than 5 minutes.   Your skin color is more cyanotic than usual.  You measure low oxygen saturations for longer than 5 minutes with a pulse oximeter. MAKE SURE YOU:   Understand these instructions.  Will watch your condition.  Will get help right away if you are not doing well or get worse. Document Released: 11/09/2004 Document Revised: 06/16/2013 Document Reviewed: 09/26/2012 [facility] Patient Information 2015 [place], [place]. This information is not intended to replace advice given to you by your health  care provider. Make sure you discuss any questions you have with your health care provider.

## 2013-12-21 NOTE — ED Notes (Signed)
Pt in from home via [facility],per report pt started to have mid radiating CP into L arm, with SOB, nause, denies vomiting & diarrhea, pt rcvd 324 mg ASA & x 2 SL nitro, with relief of 4/10 pain, pt recently left AMA x5 days ago

## 2014-01-03 ENCOUNTER — Emergency Department (HOSPITAL_COMMUNITY)
Admission: EM | Admit: 2014-01-03 | Discharge: 2014-01-03 | Disposition: A | Discharge status: Home / Self Care | Payer: Medicaid Other | Source: Home / Self Care | Attending: Emergency Medicine | Admitting: Emergency Medicine

## 2014-01-03 ENCOUNTER — Inpatient Hospital Stay (HOSPITAL_COMMUNITY)
Admission: EM | Admit: 2014-01-03 | Discharge: 2014-01-05 | DRG: 190 | Disposition: A | Discharge status: Home / Self Care | Payer: Medicaid Other | Attending: Internal Medicine | Admitting: Internal Medicine

## 2014-01-03 ENCOUNTER — Emergency Department (HOSPITAL_COMMUNITY): Payer: Medicaid Other

## 2014-01-03 ENCOUNTER — Encounter (HOSPITAL_COMMUNITY): Payer: Self-pay | Admitting: Emergency Medicine

## 2014-01-03 DIAGNOSIS — R0789 Other chest pain: Secondary | ICD-10-CM

## 2014-01-03 DIAGNOSIS — F908 Attention-deficit hyperactivity disorder, other type: Secondary | ICD-10-CM

## 2014-01-03 DIAGNOSIS — J45909 Unspecified asthma, uncomplicated: Secondary | ICD-10-CM | POA: Diagnosis present

## 2014-01-03 DIAGNOSIS — F909 Attention-deficit hyperactivity disorder, unspecified type: Secondary | ICD-10-CM

## 2014-01-03 DIAGNOSIS — M1990 Unspecified osteoarthritis, unspecified site: Secondary | ICD-10-CM | POA: Diagnosis present

## 2014-01-03 DIAGNOSIS — R0600 Dyspnea, unspecified: Secondary | ICD-10-CM

## 2014-01-03 DIAGNOSIS — R0902 Hypoxemia: Secondary | ICD-10-CM

## 2014-01-03 DIAGNOSIS — E785 Hyperlipidemia, unspecified: Secondary | ICD-10-CM | POA: Diagnosis present

## 2014-01-03 DIAGNOSIS — F329 Major depressive disorder, single episode, unspecified: Secondary | ICD-10-CM | POA: Diagnosis present

## 2014-01-03 DIAGNOSIS — Z881 Allergy status to other antibiotic agents status: Secondary | ICD-10-CM

## 2014-01-03 DIAGNOSIS — Z888 Allergy status to other drugs, medicaments and biological substances status: Secondary | ICD-10-CM

## 2014-01-03 DIAGNOSIS — F419 Anxiety disorder, unspecified: Secondary | ICD-10-CM | POA: Diagnosis present

## 2014-01-03 DIAGNOSIS — R34 Anuria and oliguria: Secondary | ICD-10-CM

## 2014-01-03 DIAGNOSIS — J4530 Mild persistent asthma, uncomplicated: Secondary | ICD-10-CM

## 2014-01-03 DIAGNOSIS — Z6835 Body mass index (BMI) 35.0-35.9, adult: Secondary | ICD-10-CM | POA: Diagnosis not present

## 2014-01-03 DIAGNOSIS — R079 Chest pain, unspecified: Secondary | ICD-10-CM | POA: Diagnosis present

## 2014-01-03 DIAGNOSIS — J441 Chronic obstructive pulmonary disease with (acute) exacerbation: Secondary | ICD-10-CM | POA: Diagnosis present

## 2014-01-03 DIAGNOSIS — I10 Essential (primary) hypertension: Secondary | ICD-10-CM

## 2014-01-03 DIAGNOSIS — E669 Obesity, unspecified: Secondary | ICD-10-CM | POA: Diagnosis present

## 2014-01-03 DIAGNOSIS — J9621 Acute and chronic respiratory failure with hypoxia: Secondary | ICD-10-CM | POA: Diagnosis present

## 2014-01-03 DIAGNOSIS — K219 Gastro-esophageal reflux disease without esophagitis: Secondary | ICD-10-CM | POA: Diagnosis present

## 2014-01-03 DIAGNOSIS — IMO0002 Reserved for concepts with insufficient information to code with codable children: Secondary | ICD-10-CM

## 2014-01-03 DIAGNOSIS — R0602 Shortness of breath: Secondary | ICD-10-CM | POA: Diagnosis not present

## 2014-01-03 DIAGNOSIS — K210 Gastro-esophageal reflux disease with esophagitis: Secondary | ICD-10-CM

## 2014-01-03 DIAGNOSIS — F32A Depression, unspecified: Secondary | ICD-10-CM | POA: Diagnosis present

## 2014-01-03 DIAGNOSIS — F17210 Nicotine dependence, cigarettes, uncomplicated: Secondary | ICD-10-CM | POA: Diagnosis present

## 2014-01-03 DIAGNOSIS — Z880 Allergy status to penicillin: Secondary | ICD-10-CM

## 2014-01-03 DIAGNOSIS — E119 Type 2 diabetes mellitus without complications: Secondary | ICD-10-CM | POA: Diagnosis present

## 2014-01-03 DIAGNOSIS — Z9981 Dependence on supplemental oxygen: Secondary | ICD-10-CM

## 2014-01-03 DIAGNOSIS — R05 Cough: Secondary | ICD-10-CM | POA: Diagnosis present

## 2014-01-03 DIAGNOSIS — J449 Chronic obstructive pulmonary disease, unspecified: Secondary | ICD-10-CM

## 2014-01-03 DIAGNOSIS — E1165 Type 2 diabetes mellitus with hyperglycemia: Secondary | ICD-10-CM

## 2014-01-03 DIAGNOSIS — J40 Bronchitis, not specified as acute or chronic: Secondary | ICD-10-CM

## 2014-01-03 DIAGNOSIS — Z90710 Acquired absence of both cervix and uterus: Secondary | ICD-10-CM | POA: Diagnosis not present

## 2014-01-03 DIAGNOSIS — J4521 Mild intermittent asthma with (acute) exacerbation: Secondary | ICD-10-CM

## 2014-01-03 LAB — CBC
HEMATOCRIT: 42.6 % (ref 36.0–46.0)
Hemoglobin: 14 g/dL (ref 12.0–15.0)
MCH: 29.2 pg (ref 26.0–34.0)
MCHC: 32.9 g/dL (ref 30.0–36.0)
MCV: 88.9 fL (ref 78.0–100.0)
PLATELETS: 233 10*3/uL (ref 150–400)
RBC: 4.79 MIL/uL (ref 3.87–5.11)
RDW: 12.9 % (ref 11.5–15.5)
WBC: 9.2 10*3/uL (ref 4.0–10.5)

## 2014-01-03 LAB — BASIC METABOLIC PANEL
ANION GAP: 18 — AB (ref 5–15)
BUN: 18 mg/dL (ref 6–23)
CHLORIDE: 91 meq/L — AB (ref 96–112)
CO2: 23 meq/L (ref 19–32)
CREATININE: 0.74 mg/dL (ref 0.50–1.10)
Calcium: 9.1 mg/dL (ref 8.4–10.5)
GFR calc Af Amer: >90 mL/min (ref >90)
GFR calc non Af Amer: >90 mL/min (ref >90)
Glucose, Bld: 384 mg/dL — ABNORMAL HIGH (ref 70–99)
Potassium: 4.1 mEq/L (ref 3.7–5.3)
Sodium: 132 mEq/L — ABNORMAL LOW (ref 137–147)

## 2014-01-03 LAB — POCT URINALYSIS DIP (DEVICE)
Bilirubin Urine: NEGATIVE
Glucose, UA: >=1000 mg/dL — AB
Hgb urine dipstick: NEGATIVE
Ketones, ur: NEGATIVE mg/dL
Leukocytes, UA: NEGATIVE
Nitrite: NEGATIVE
Protein, ur: NEGATIVE mg/dL
Specific Gravity, Urine: 1.02 (ref 1.005–1.030)
Urobilinogen, UA: 0.2 mg/dL (ref 0.0–1.0)
pH: 5 (ref 5.0–8.0)

## 2014-01-03 LAB — I-STAT TROPONIN, ED: Troponin i, poc: 0 ng/mL (ref 0.00–0.08)

## 2014-01-03 LAB — PRO B NATRIURETIC PEPTIDE: Pro B Natriuretic peptide (BNP): 9.9 pg/mL (ref 0–125)

## 2014-01-03 MED ORDER — IPRATROPIUM BROMIDE 0.02 % IN SOLN
0.5000 mg | Freq: Once | RESPIRATORY_TRACT | Status: AC
  Administered 2014-01-03: 0.5 mg via RESPIRATORY_TRACT

## 2014-01-03 MED ORDER — ALBUTEROL (5 MG/ML) CONTINUOUS INHALATION SOLN
10.0000 mg/h | INHALATION_SOLUTION | Freq: Once | RESPIRATORY_TRACT | Status: AC
  Administered 2014-01-03: 10 mg/h via RESPIRATORY_TRACT
  Filled 2014-01-03: qty 20

## 2014-01-03 MED ORDER — DEXAMETHASONE SODIUM PHOSPHATE 10 MG/ML IJ SOLN
10.0000 mg | Freq: Once | INTRAMUSCULAR | Status: AC
  Administered 2014-01-03: 10 mg via INTRAVENOUS
  Filled 2014-01-03: qty 1

## 2014-01-03 MED ORDER — SODIUM CHLORIDE 0.9 % IV BOLUS (SEPSIS)
1000.0000 mL | Freq: Once | INTRAVENOUS | Status: AC
  Administered 2014-01-03: 1000 mL via INTRAVENOUS

## 2014-01-03 MED ORDER — ALBUTEROL SULFATE (2.5 MG/3ML) 0.083% IN NEBU
5.0000 mg | INHALATION_SOLUTION | Freq: Once | RESPIRATORY_TRACT | Status: AC
  Administered 2014-01-03: 5 mg via RESPIRATORY_TRACT

## 2014-01-03 MED ORDER — ALBUTEROL SULFATE (2.5 MG/3ML) 0.083% IN NEBU
INHALATION_SOLUTION | RESPIRATORY_TRACT | Status: AC
  Filled 2014-01-03: qty 6

## 2014-01-03 MED ORDER — FENTANYL CITRATE 0.05 MG/ML IJ SOLN
50.0000 ug | Freq: Once | INTRAMUSCULAR | Status: AC
  Administered 2014-01-03: 50 ug via INTRAVENOUS
  Filled 2014-01-03: qty 2

## 2014-01-03 MED ORDER — ALBUTEROL SULFATE (2.5 MG/3ML) 0.083% IN NEBU
2.5000 mg | INHALATION_SOLUTION | RESPIRATORY_TRACT | Status: DC | PRN
Start: 2014-01-03 — End: 2014-01-04
  Administered 2014-01-03: 2.5 mg via RESPIRATORY_TRACT
  Filled 2014-01-03: qty 3

## 2014-01-03 MED ORDER — SODIUM CHLORIDE 0.9 % IV SOLN
INTRAVENOUS | Status: DC
  Administered 2014-01-03 – 2014-01-04 (×2): via INTRAVENOUS

## 2014-01-03 MED ORDER — IPRATROPIUM-ALBUTEROL 0.5-2.5 (3) MG/3ML IN SOLN: 3.0000 mL | Freq: Once | RESPIRATORY_TRACT | Status: DC

## 2014-01-03 MED ORDER — IPRATROPIUM BROMIDE 0.02 % IN SOLN
0.5000 mg | Freq: Once | RESPIRATORY_TRACT | Status: AC
  Administered 2014-01-03: 0.5 mg via RESPIRATORY_TRACT
  Filled 2014-01-03: qty 2.5

## 2014-01-03 MED ORDER — IPRATROPIUM BROMIDE 0.02 % IN SOLN
RESPIRATORY_TRACT | Status: AC
  Filled 2014-01-03: qty 2.5

## 2014-01-03 NOTE — ED Provider Notes (Signed)
CSN: [id number]     Arrival date & time 01/03/14  1933 History   First MD Initiated Contact with Patient 01/03/14 2017     Chief Complaint  Patient presents with  . Chest Pain     (Consider location/radiation/quality/duration/timing/severity/associated sxs/prior Treatment) HPI Comments: 58 year old female with history of obesity, reflux, bronchitis/COPD, diabetes presents with worsening shortness of breath, chest congestion, cough for the past few days. Patient on 2 L nasal cannula at night. Patient was supposed to be on antibiotics for possible bronchitis however it was never filled it allergy. Symptoms worse with coughing and activity. Nonradiating chest congestion/pressure on the right side. No recent surgeries, no blood clot history, no unilateral symptoms in the leg, no hemoptysis, no active cancer.  Patient is a 58 y.o. female presenting with chest pain. The history is provided by the patient.  Chest Pain Associated symptoms: cough   Associated symptoms: no abdominal pain, no back pain, no fever, no headache, no shortness of breath and not vomiting     Past Medical History  Diagnosis Date  . Depression   . Arthritis   . 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 ADD (attention deficit disorder with hyperactivity)   . Hypertension   . COPD (chronic obstructive pulmonary disease)   . Asthma   . GERD (gastroesophageal reflux disease)   . Anxiety   . Bronchitis   . Gestational diabetes    Past Surgical History  Procedure Laterality Date  . Abdominal hysterectomy    . Spine surgery    . Vulvar excision  2006    Cancer in situ  . Oophorectomy  2006    cervical cancer  . Hernia repair      incisional/ventral hernia repair  . Rectovaginal fistula closure  2012  . Colectomy     Family History  Problem Relation Age of Onset  . COPD Father   . Cancer Father     lung  . Cancer Paternal Aunt     lung   History  Substance Use Topics  . Smoking status: Current Every Day Smoker --  1.00 packs/day    Types: Cigarettes  . Smokeless tobacco: Never Used  . Alcohol Use: No   OB History    No data available     Review of Systems  Constitutional: Negative for fever and chills.  HENT: Positive for congestion.   Eyes: Negative for visual disturbance.  Respiratory: Positive for cough, chest tightness and wheezing. Negative for shortness of breath.   Cardiovascular: Positive for chest pain. Negative for leg swelling.  Gastrointestinal: Negative for vomiting and abdominal pain.  Genitourinary: Negative for dysuria and flank pain.  Musculoskeletal: Negative for back pain, neck pain and neck stiffness.  Skin: Negative for rash.  Neurological: Negative for light-headedness and headaches.      Allergies  Penicillins; Contrast media; Doxycycline; Erythromycin; and Metronidazole  Home Medications   Prior to Admission medications   Medication Sig Start Date End Date Taking? Authorizing Provider  albuterol (PROVENTIL HFA;VENTOLIN HFA) 108 (90 BASE) MCG/ACT inhaler Inhale 2 puffs into the lungs 2 (two) times daily as needed for wheezing.   Yes Historical Provider, MD  alprazolam Duanne Moron) 2 MG tablet Take 1 mg by mouth at bedtime as needed for sleep or anxiety (Can take up to 3 thimes daily prn). Patient takes 1/2 tablet by mouth four times a day   Yes Historical Provider, MD  amphetamine-dextroamphetamine (ADDERALL XR) 10 MG 24 hr capsule Take 10 mg by mouth daily.  Yes Historical Provider, MD  amphetamine-dextroamphetamine (ADDERALL XR) 10 MG 24 hr capsule Take 10 mg by mouth 2 (two) times daily.   Yes Historical Provider, MD  ARIPiprazole (ABILIFY) 5 MG tablet Take 5 mg by mouth daily.    Yes Historical Provider, MD  atorvastatin (LIPITOR) 20 MG tablet Take 20 mg by mouth daily at 6 PM.   Yes Historical Provider, MD  cyclobenzaprine (FLEXERIL) 10 MG tablet Take 10 mg by mouth at bedtime.   Yes Historical Provider, MD  doxepin (SINEQUAN) 25 MG capsule Take 25 mg by mouth at  bedtime as needed (for sleep).   Yes Historical Provider, MD  glipiZIDE (GLUCOTROL) 10 MG tablet Take 10 mg by mouth daily before breakfast.   Yes Historical Provider, MD  hydrochlorothiazide (HYDRODIURIL) 25 MG tablet Take 1 tablet (25 mg total) by mouth daily. 09/23/12  Yes Gertie Exon, MD  HYDROcodone-acetaminophen (NORCO/VICODIN) 5-325 MG per tablet Take 1 tablet by mouth every 6 (six) hours as needed for moderate pain.   Yes Historical Provider, MD  insulin glargine (LANTUS) 100 UNIT/ML injection Inject 45 Units into the skin at bedtime.   Yes Historical Provider, MD  lisinopril (PRINIVIL,ZESTRIL) 20 MG tablet Take 20 mg by mouth daily.   Yes Historical Provider, MD  metFORMIN (GLUCOPHAGE) 500 MG tablet Take 500 mg by mouth 2 (two) times daily with a meal.   Yes Historical Provider, MD  Mirtazapine (REMERON PO) Take 45 mg by mouth at bedtime.    Yes Historical Provider, MD  ondansetron (ZOFRAN) 4 MG tablet Take 4 mg by mouth every 8 (eight) hours as needed for nausea or vomiting.   Yes Historical Provider, MD  predniSONE (DELTASONE) 10 MG tablet Take 10 mg by mouth daily with breakfast.   Yes Historical Provider, MD  Vilazodone HCl (VIIBRYD) 40 MG TABS Take 40 mg by mouth daily.   Yes Historical Provider, MD  Vitamin D, Ergocalciferol, (DRISDOL) 50000 UNITS CAPS Take 50,000 Units by mouth every 7 (seven) days. On thursday   Yes Historical Provider, MD   BP 106/47 mmHg  Pulse 80  Temp(Src)  (Oral)  Resp 13  SpO2 97% Physical Exam  Constitutional: She is oriented to person, place, and time. She appears well-developed and well-nourished.  HENT:  Head: Normocephalic and atraumatic.  Eyes: Conjunctivae are normal. Right eye exhibits no discharge. Left eye exhibits no discharge.  Neck: Normal range of motion. Neck supple. No tracheal deviation present.  Cardiovascular: Normal rate and regular rhythm.   Pulmonary/Chest: Effort normal. She has wheezes (expiratory bilateral, mild tachypnea).   Abdominal: Soft. She exhibits no distension (obese). There is no tenderness. There is no guarding.  Musculoskeletal: She exhibits edema (very mild lower extremities bilateral no calf tenderness).  Neurological: She is alert and oriented to person, place, and time.  Skin: Skin is warm. No rash noted.  Psychiatric: She has a normal mood and affect.  Nursing note and vitals reviewed.   ED Course  Procedures (including critical care time) Labs Review Labs Reviewed  BASIC METABOLIC PANEL - Abnormal; Notable for the following:    Sodium 132 (*)    Chloride 91 (*)    Glucose, Bld 384 (*)    Anion gap 18 (*)    All other components within normal limits  CBC  PRO B NATRIURETIC PEPTIDE  I-STAT TROPOININ, ED    Imaging Review Dg Chest 2 View  01/03/2014   CLINICAL DATA:  Short breath, cough and congestion  EXAM: CHEST  2  VIEW  COMPARISON:  12/21/2013  FINDINGS: Normal cardiac silhouette. There is linear markings at the left and right lung base. These are increased compared to prior. For lungs are clear. Lungs are hyperinflated. No acute osseous abnormality.  IMPRESSION: Linear markings in the lung base suggest bronchitis.   Electronically Signed   By: Suzy Bouchard M.D.   On: 01/03/2014 21:35     EKG Interpretation None     EKG reviewed, normal QT, sinus, no acute ST elevation, poor R wave progression. MDM   Final diagnoses:  Acute exacerbation of chronic obstructive pulmonary disease (COPD)  Acute dyspnea  Hypoxia  Chest pressure   Patient clinically with acute COPD exacerbation and possible bronchitis/pneumonia. Continuous neb ordered, steroids, chest x-ray reviewed no acute infiltrate. Patient requiring 2 L nasal cannula, patient a 2-84% without oxygen which is worse than her baseline. Patient has no recent risks for pulmonary embolism. Clinically treating as acute COPD and paged to hospitalist for admission.  The patients results and plan were reviewed and discussed.   Any  x-rays performed were personally reviewed by myself.   Differential diagnosis were considered with the presenting HPI.  Medications  albuterol (PROVENTIL) (2.5 MG/3ML) 0.083% nebulizer solution 2.5 mg (2.5 mg Nebulization Given 01/03/14 2220)  0.9 %  sodium chloride infusion ( Intravenous Transfusing/Transfer 01/04/14 0018)  dexamethasone (DECADRON) injection 10 mg (10 mg Intravenous Given 01/03/14 2158)  fentaNYL (SUBLIMAZE) injection 50 mcg (50 mcg Intravenous Given 01/03/14 2100)  albuterol (PROVENTIL,VENTOLIN) solution continuous neb (10 mg/hr Nebulization Given 01/03/14 2233)  ipratropium (ATROVENT) nebulizer solution 0.5 mg (0.5 mg Nebulization Given 01/03/14 2233)  sodium chloride 0.9 % bolus 1,000 mL (0 mLs Intravenous Stopped 01/03/14 2355)    Filed Vitals:   01/03/14 2230 01/03/14 2234 01/03/14 2352 01/04/14 0000  BP: 95/53  114/58 106/47  Pulse: 75  87 80  Resp: 15  16 13   SpO2: 92% 97% 97% 97%    Final diagnoses:  Acute exacerbation of chronic obstructive pulmonary disease (COPD)  Acute dyspnea  Hypoxia  Chest pressure    Admission/ observation were discussed with the admitting physician, patient and/or family and they are comfortable with the plan.     Mariea Clonts, MD 01/04/14 Laureen Abrahams

## 2014-01-03 NOTE — ED Provider Notes (Signed)
CSN: [id number]     Arrival date & time 01/03/14  1740 History   First MD Initiated Contact with Patient 01/03/14 1758     Chief Complaint  Patient presents with  . URI  . Urinary Retention   (Consider location/radiation/quality/duration/timing/severity/associated sxs/prior Treatment) HPI            58 year old female with history of poorly controlled diabetes and COPD presents for evaluation of shortness of breath, chest congestion, and difficulty urinating. This started a few days ago. She went to her doctor who prescribed azithromycin which she cannot take because she is allergic to it. Her doctor evaluated her for the decreased ability to urinate and she was told to go home and drink lots of water, and subsequently she has started to urinate normally. She feels extremely short of breath and is unable to walk without getting very short of breath. She denies chest pain, NVD, fever, chills. She admits to being much more short of breath than her baseline.  Past Medical History  Diagnosis Date  . Depression   . Arthritis   . 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 ADD (attention deficit disorder with hyperactivity)   . Hypertension   . COPD (chronic obstructive pulmonary disease)   . Asthma   . GERD (gastroesophageal reflux disease)   . Anxiety   . Bronchitis   . Gestational diabetes    Past Surgical History  Procedure Laterality Date  . Abdominal hysterectomy    . Spine surgery    . Vulvar excision  2006    Cancer in situ  . Oophorectomy  2006    cervical cancer  . Hernia repair      incisional/ventral hernia repair  . Rectovaginal fistula closure  2012  . Colectomy     Family History  Problem Relation Age of Onset  . COPD Father   . Cancer Father     lung  . Cancer Paternal Aunt     lung   History  Substance Use Topics  . Smoking status: Current Every Day Smoker -- 1.00 packs/day    Types: Cigarettes  . Smokeless tobacco: Never Used  . Alcohol Use: No   OB History    No data available     Review of Systems  Allergies  Penicillins; Contrast media; Doxycycline; Erythromycin; and Metronidazole  Home Medications   Prior to Admission medications   Medication Sig Start Date End Date Taking? Authorizing Provider  albuterol (PROVENTIL HFA;VENTOLIN HFA) 108 (90 BASE) MCG/ACT inhaler Inhale 2 puffs into the lungs 2 (two) times daily as needed for wheezing.   Yes Historical Provider, MD  alprazolam Duanne Moron) 2 MG tablet Take 1 mg by mouth at bedtime as needed for sleep or anxiety (Can take up to 3 thimes daily prn). Patient takes 1/2 tablet by mouth four times a day    Historical Provider, MD  amphetamine-dextroamphetamine (ADDERALL XR) 10 MG 24 hr capsule Take 20 mg by mouth daily.    Historical Provider, MD  amphetamine-dextroamphetamine (ADDERALL) 10 MG tablet Take 10 mg by mouth daily.    Historical Provider, MD  ARIPiprazole (ABILIFY) 5 MG tablet Take 5 mg by mouth daily.     Historical Provider, MD  aspirin 81 MG chewable tablet Chew 324 mg by mouth once.    Historical Provider, MD  atorvastatin (LIPITOR) 20 MG tablet Take 20 mg by mouth daily at 6 PM.    Historical Provider, MD  cyclobenzaprine (FLEXERIL) 10 MG tablet Take 10 mg by mouth  at bedtime.    Historical Provider, MD  doxepin (SINEQUAN) 25 MG capsule Take 25 mg by mouth at bedtime as needed (for sleep).    Historical Provider, MD  FLUoxetine (PROZAC) 20 MG capsule Take 40 mg by mouth daily.     Historical Provider, MD  glipiZIDE (GLUCOTROL) 10 MG tablet Take 10 mg by mouth daily before breakfast.    Historical Provider, MD  hydrochlorothiazide (HYDRODIURIL) 25 MG tablet Take 1 tablet (25 mg total) by mouth daily. 09/23/12   Gertie Exon, MD  HYDROcodone-acetaminophen (NORCO/VICODIN) 5-325 MG per tablet Take 1 tablet by mouth every 6 (six) hours as needed for moderate pain.    Historical Provider, MD  insulin glargine (LANTUS) 100 UNIT/ML injection Inject 10 Units into the skin at bedtime.     Historical Provider, MD  lisinopril (PRINIVIL,ZESTRIL) 20 MG tablet Take 20 mg by mouth daily.    Historical Provider, MD  loperamide (IMODIUM) 2 MG capsule Take 2 mg by mouth 4 (four) times daily as needed for diarrhea or loose stools.    Historical Provider, MD  metFORMIN (GLUCOPHAGE) 500 MG tablet Take 500 mg by mouth 2 (two) times daily with a meal.    Historical Provider, MD  Mirtazapine (REMERON PO) Take 45 mg by mouth at bedtime.     Historical Provider, MD  Multiple Vitamin (MULTIVITAMIN PO) Take 1 tablet by mouth daily.     Historical Provider, MD  nabumetone (RELAFEN) 750 MG tablet Take 750 mg by mouth as needed (pain).    Historical Provider, MD  omeprazole (PRILOSEC) 20 MG capsule Take 20 mg by mouth daily.    Historical Provider, MD  ondansetron (ZOFRAN) 4 MG tablet Take 4 mg by mouth every 8 (eight) hours as needed for nausea or vomiting.    Historical Provider, MD  oxyCODONE-acetaminophen (PERCOCET/ROXICET) 5-325 MG per tablet Take 1 tablet by mouth every 4 (four) hours as needed. 01/22/13   Pedro Earls, MD  predniSONE (DELTASONE) 1 MG tablet Take 1 mg by mouth daily.    Historical Provider, MD  predniSONE (DELTASONE) 10 MG tablet Take 10 mg by mouth daily with breakfast.    Historical Provider, MD  Vilazodone HCl (VIIBRYD) 40 MG TABS Take 40 mg by mouth daily.    Historical Provider, MD  Vitamin D, Ergocalciferol, (DRISDOL) 50000 UNITS CAPS Take 50,000 Units by mouth every 7 (seven) days. On thursday    Historical Provider, MD   BP 127/73 mmHg  Pulse 101  Temp(Src) 97.6 F (36.4 C) (Oral)  Resp 22  SpO2 92% Physical Exam  Constitutional: She is oriented to person, place, and time. Vital signs are normal. She appears well-developed and well-nourished. No distress.  HENT:  Head: Normocephalic and atraumatic.  Cardiovascular: Normal rate, regular rhythm, normal heart sounds and intact distal pulses.   Pulmonary/Chest: Accessory muscle usage present. Tachypnea noted. She is  in respiratory distress. She has wheezes (respiratory and expiratory).  Prolonged expiratory component. 3 to 4 word dyspnea  Neurological: She is alert and oriented to person, place, and time. She has normal strength. Coordination normal.  Skin: Skin is warm and dry. No rash noted. She is not diaphoretic.  Psychiatric: She has a normal mood and affect. Judgment normal.  Nursing note and vitals reviewed.   ED Course  Procedures (including critical care time) Labs Review Labs Reviewed  POCT URINALYSIS DIP (DEVICE) - Abnormal; Notable for the following:    Glucose, UA >=1000 (*)    All other components within  normal limits    Imaging Review No results found.   MDM   1. SOB (shortness of breath)   2. Decreased urination   3. Diabetes type 2, uncontrolled    This patient is significantly short of breath and will need much more evaluation than is possible at the urgent care. I also feel like she will need steroids to help her through this exacerbation of COPD which may require inpatient management due to her poorly controlled diabetes. She will be given a breathing treatment, put on 2 L of oxygen via nasal cannula, and transferred to the emergency department via shuttle   Meds ordered this encounter  Medications  . albuterol (PROVENTIL) (2.5 MG/3ML) 0.083% nebulizer solution 5 mg    Sig:   . ipratropium (ATROVENT) nebulizer solution 0.5 mg    Sig:      Liam Graham, PA-C 01/03/14 1840

## 2014-01-03 NOTE — ED Notes (Signed)
Pt C/O a cough, congestion, and the inability to urinate for two days. Pt alert, panting when talking, and appeared to be exacerbated when prompted to answer questions. Shirlean Kelly, CMA

## 2014-01-03 NOTE — ED Notes (Signed)
Pt placed on 2 liters via nasal cannula for low oxygen levels in the mid 80's with good waveform

## 2014-01-03 NOTE — ED Notes (Signed)
Pt O2 stat on 2 liter of O2 = 100

## 2014-01-03 NOTE — ED Notes (Signed)
RT called to start Cont. Neb.

## 2014-01-03 NOTE — H&P (Addendum)
[facility] History and Physical  LOMETA RIGGIN VXY:[id number] DOB: 08/08/1955 DOA: 01/03/2014  Referring physician: ED physician PCP: Barbette Merino, MD  Specialists:   Chief Complaint: Productive Cough and shortness of breath, and chest pain.  HPI: [name] is a 58 y.o. female with past medical history of COPD, asthma, GERD, depression, anxiety, hypertension, ADHD, hyperlipidemia, diabetes mellitus, tobacco abuse, who presented with productive cough and shortness of breath, and chest pain.  Patient reports that she has been having cough, shortness of breath and chest pain in the past 1 to 2 weeks. She has greenish sputum production. Her chest pain is located is in the right side of the chest, constant, nonradiating. Her chest pain is pleuritic and aggravated by deep breath. She also has some mild nausea, but no vomiting, abdominal pain or diarrhea. No symptoms for UTI. She was evaluated in urgent care, and was suggested to come in the emergency room for further evaluation and treatment. Of note, patient was seen in ED on 11/8 because of chest pain. She was given referral to cardiologist. She is supposed to see cardiologist next Tuesday. She denies fever, chills, abdominal pain, diarrhea,  dysuria, urgency, frequency, hematuria, skin rashes or leg swelling.  Work up in the ED demonstrates negative troponin; proBNP 9.9; no leukocytosis; possible bronchitis on chest x-ray. She is admitted to inpatient for further evaluation and treatment.  Review of Systems: As presented in the history of presenting illness, rest negative.  Where does patient live?   Can patient participate in ADLs? Yes  Allergy:  Allergies  Allergen Reactions  . Penicillins Itching and Rash    All over the body.  . Contrast Media [Iodinated Diagnostic Agents] Nausea And Vomiting  . Doxycycline Nausea And Vomiting  . Erythromycin Nausea And Vomiting  . Metronidazole Hives and Rash    Past Medical History   Diagnosis Date  . Depression   . Arthritis   . 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 ADD (attention deficit disorder with hyperactivity)   . Hypertension   . COPD (chronic obstructive pulmonary disease)   . Asthma   . GERD (gastroesophageal reflux disease)   . Anxiety   . Bronchitis   . Gestational diabetes     Past Surgical History  Procedure Laterality Date  . Abdominal hysterectomy    . Spine surgery    . Vulvar excision  2006    Cancer in situ  . Oophorectomy  2006    cervical cancer  . Hernia repair      incisional/ventral hernia repair  . Rectovaginal fistula closure  2012  . Colectomy      Social History:  reports that she has been smoking Cigarettes.  She has been smoking about 1.00 pack per day. She has never used smokeless tobacco. She reports that she does not drink alcohol or use illicit drugs.  Family History:  Family History  Problem Relation Age of Onset  . COPD Father   . Cancer Father     lung  . Cancer Paternal Aunt     lung     Prior to Admission medications   Medication Sig Start Date End Date Taking? Authorizing Provider  albuterol (PROVENTIL HFA;VENTOLIN HFA) 108 (90 BASE) MCG/ACT inhaler Inhale 2 puffs into the lungs 2 (two) times daily as needed for wheezing.    Historical Provider, MD  alprazolam Duanne Moron) 2 MG tablet Take 1 mg by mouth at bedtime as needed for sleep or anxiety (Can take up to 3 thimes  daily prn). Patient takes 1/2 tablet by mouth four times a day    Historical Provider, MD  amphetamine-dextroamphetamine (ADDERALL XR) 10 MG 24 hr capsule Take 20 mg by mouth daily.    Historical Provider, MD  amphetamine-dextroamphetamine (ADDERALL) 10 MG tablet Take 10 mg by mouth daily.    Historical Provider, MD  ARIPiprazole (ABILIFY) 5 MG tablet Take 5 mg by mouth daily.     Historical Provider, MD  aspirin 81 MG chewable tablet Chew 324 mg by mouth once.    Historical Provider, MD  atorvastatin (LIPITOR) 20 MG tablet Take 20 mg by mouth daily at  6 PM.    Historical Provider, MD  cyclobenzaprine (FLEXERIL) 10 MG tablet Take 10 mg by mouth at bedtime.    Historical Provider, MD  doxepin (SINEQUAN) 25 MG capsule Take 25 mg by mouth at bedtime as needed (for sleep).    Historical Provider, MD  FLUoxetine (PROZAC) 20 MG capsule Take 40 mg by mouth daily.     Historical Provider, MD  glipiZIDE (GLUCOTROL) 10 MG tablet Take 10 mg by mouth daily before breakfast.    Historical Provider, MD  hydrochlorothiazide (HYDRODIURIL) 25 MG tablet Take 1 tablet (25 mg total) by mouth daily. 09/23/12   Gertie Exon, MD  HYDROcodone-acetaminophen (NORCO/VICODIN) 5-325 MG per tablet Take 1 tablet by mouth every 6 (six) hours as needed for moderate pain.    Historical Provider, MD  insulin glargine (LANTUS) 100 UNIT/ML injection Inject 10 Units into the skin at bedtime.    Historical Provider, MD  lisinopril (PRINIVIL,ZESTRIL) 20 MG tablet Take 20 mg by mouth daily.    Historical Provider, MD  loperamide (IMODIUM) 2 MG capsule Take 2 mg by mouth 4 (four) times daily as needed for diarrhea or loose stools.    Historical Provider, MD  metFORMIN (GLUCOPHAGE) 500 MG tablet Take 500 mg by mouth 2 (two) times daily with a meal.    Historical Provider, MD  Mirtazapine (REMERON PO) Take 45 mg by mouth at bedtime.     Historical Provider, MD  Multiple Vitamin (MULTIVITAMIN PO) Take 1 tablet by mouth daily.     Historical Provider, MD  nabumetone (RELAFEN) 750 MG tablet Take 750 mg by mouth as needed (pain).    Historical Provider, MD  omeprazole (PRILOSEC) 20 MG capsule Take 20 mg by mouth daily.    Historical Provider, MD  ondansetron (ZOFRAN) 4 MG tablet Take 4 mg by mouth every 8 (eight) hours as needed for nausea or vomiting.    Historical Provider, MD  oxyCODONE-acetaminophen (PERCOCET/ROXICET) 5-325 MG per tablet Take 1 tablet by mouth every 4 (four) hours as needed. 01/22/13   Pedro Earls, MD  predniSONE (DELTASONE) 1 MG tablet Take 1 mg by mouth daily.     Historical Provider, MD  predniSONE (DELTASONE) 10 MG tablet Take 10 mg by mouth daily with breakfast.    Historical Provider, MD  Vilazodone HCl (VIIBRYD) 40 MG TABS Take 40 mg by mouth daily.    Historical Provider, MD  Vitamin D, Ergocalciferol, (DRISDOL) 50000 UNITS CAPS Take 50,000 Units by mouth every 7 (seven) days. On thursday    Historical Provider, MD    Physical Exam: Filed Vitals:   01/03/14 2102 01/03/14 2200 01/03/14 2230 01/03/14 2234  BP:  92/46 95/53   Pulse:  76 75   Resp:  15 15   SpO2: 90% 94% 92% 97%   General: Not in acute distress HEENT:  Eyes: PERRL, EOMI, no scleral icterus       ENT: No discharge from the ears and nose, no pharynx injection, no tonsillar enlargement.        Neck: No JVD, no bruit, no mass felt. Cardiac: S1/S2, RRR, No murmurs, No gallops or rubs Pulm: diffused wheezing and rhonchi bilaterally Abd: Soft, nondistended, nontender, no rebound pain, no organomegaly, BS present Ext: No edema bilaterally. 2+DP/PT pulse bilaterally Musculoskeletal: No joint deformities, erythema, or stiffness, ROM full Skin: No rashes.  Neuro: Alert and oriented X3, cranial nerves II-XII grossly intact, muscle strength 5/5 in all extremeties, sensation to light touch intact. Moves all extremities. Psych: Patient is not psychotic, no suicidal or hemocidal ideation.  Labs on Admission:  Basic Metabolic Panel:  Recent Labs Lab 01/03/14 1949  NA 132*  K 4.1  CL 91*  CO2 23  GLUCOSE 384*  BUN 18  CREATININE 0.74  CALCIUM 9.1   Liver Function Tests: No results for input(s): AST, ALT, ALKPHOS, BILITOT, PROT, ALBUMIN in the last 168 hours. No results for input(s): LIPASE, AMYLASE in the last 168 hours. No results for input(s): AMMONIA in the last 168 hours. CBC:  Recent Labs Lab 01/03/14 1949  WBC 9.2  HGB 14.0  HCT 42.6  MCV 88.9  PLT 233   Cardiac Enzymes: No results for input(s): CKTOTAL, CKMB, CKMBINDEX, TROPONINI in the last 168  hours.  BNP (last 3 results)  Recent Labs  01/03/14 1949  PROBNP 9.9   CBG: No results for input(s): GLUCAP in the last 168 hours.  Radiological Exams on Admission: Dg Chest 2 View  01/03/2014   CLINICAL DATA:  Short breath, cough and congestion  EXAM: CHEST  2 VIEW  COMPARISON:  12/21/2013  FINDINGS: Normal cardiac silhouette. There is linear markings at the left and right lung base. These are increased compared to prior. For lungs are clear. Lungs are hyperinflated. No acute osseous abnormality.  IMPRESSION: Linear markings in the lung base suggest bronchitis.   Electronically Signed   By: Suzy Bouchard M.D.   On: 01/03/2014 21:35    EKG: Independently reviewed.   Assessment/Plan Principal Problem:   COPD exacerbation Active Problems:   GERD (gastroesophageal reflux disease)   Anxiety   Bronchitis   Chest pain   Depression   Hypertension   Asthma   Diabetes mellitus without complication   HLD (hyperlipidemia)   ADHD (attention deficit hyperactivity disorder)  Shortness of breath: Productive cough and shortness of breath, plus diffused wheezing on lung ausculation are most likely caused by COPD and asthma exacerbation. She also has possible bronchitis as evidenced on chest x-ray, which may explain her chest pain. Patient continues to smoke. I discussed the importance of quitting smoking with patient.  -will admit patient to telemetry bed  -Nebulizers: Scheduled Duoneb and prn albuterol -Solu-Medrol 60 mg q6h -IV levaquin -Robitussin for cough  -Blood and sputum culture  -Respiratory virus panel -urinary antigen for Legionella and strep -Urine drug screen   Chest pain: Likely due to bronchitis. He is less likely due to pulmonary embolism (no tachycardia, Well's score low probability. Given her risk factors of diabetes, hypertension, smoking and old age, it is important to rule out ACS. -trop x 3 -repeat EKG in AM -Aspirin and Lipitor  Hypertension: Patient is on  lisinopril and HCTZ at home. Her blood pressure is soft on admission, 92/46 mmHg -will hold blood pressure medications because she is at risk of developing sepsis -IV fluid: 1 L normal saline  followed by 125 mL per hour -Check lactate level  Diabetes mellitus: No A1crecord. On Lantus, glipizide and metformin at home -Continue Lantus 10 units daily -Switch oral medications to sliding scale insulin -check A1c  Tobacco abuse: One pack a day for more than 40 years -counseling -Nicotine patch  Psych problems: Including ADHD, depression, anxiety. Currently stable, no suicidal or homicidal ideation. -Continue home medications (QTc interval 451)   DVT ppx: SQ Heparin  Code Status: Full code Family Communication: None at bed side.       Disposition Plan: Admit to inpatient   Date of Service 01/03/2014    Ivor Costa [facility] Pager [phone]  If 7PM-7AM, please contact night-coverage [email or web address] Password [facility] 01/03/2014, 10:41 PM

## 2014-01-03 NOTE — ED Notes (Signed)
Pt. reports mid chest tightness/heaviness with SOB and productive cough onset this morning , mild nausea , no diaphoresis .

## 2014-01-03 NOTE — ED Notes (Signed)
Pt declined shuttle service... Family member, Kelton Pillar, will take pt by private vehicle

## 2014-01-03 NOTE — ED Notes (Signed)
Reports she feels much better after breathing tx.

## 2014-01-04 DIAGNOSIS — R079 Chest pain, unspecified: Secondary | ICD-10-CM

## 2014-01-04 LAB — COMPREHENSIVE METABOLIC PANEL
ALK PHOS: 70 U/L (ref 39–117)
ALT: 17 U/L (ref 0–35)
AST: 17 U/L (ref 0–37)
Albumin: 3.1 g/dL — ABNORMAL LOW (ref 3.5–5.2)
Anion gap: 18 — ABNORMAL HIGH (ref 5–15)
BUN: 15 mg/dL (ref 6–23)
CALCIUM: 8.7 mg/dL (ref 8.4–10.5)
CO2: 22 meq/L (ref 19–32)
Chloride: 97 mEq/L (ref 96–112)
Creatinine, Ser: 0.7 mg/dL (ref 0.50–1.10)
GFR calc non Af Amer: >90 mL/min (ref >90)
GLUCOSE: 354 mg/dL — AB (ref 70–99)
POTASSIUM: 4.2 meq/L (ref 3.7–5.3)
SODIUM: 137 meq/L (ref 137–147)
TOTAL PROTEIN: 6.9 g/dL (ref 6.0–8.3)
Total Bilirubin: 0.3 mg/dL (ref 0.3–1.2)

## 2014-01-04 LAB — CBC WITH DIFFERENTIAL/PLATELET
Basophils Absolute: 0 10*3/uL (ref 0.0–0.1)
Basophils Relative: 0 % (ref 0–1)
EOS PCT: 0 % (ref 0–5)
Eosinophils Absolute: 0 10*3/uL (ref 0.0–0.7)
HCT: 40.4 % (ref 36.0–46.0)
HEMOGLOBIN: 13 g/dL (ref 12.0–15.0)
LYMPHS ABS: 0.5 10*3/uL — AB (ref 0.7–4.0)
Lymphocytes Relative: 6 % — ABNORMAL LOW (ref 12–46)
MCH: 29.6 pg (ref 26.0–34.0)
MCHC: 32.2 g/dL (ref 30.0–36.0)
MCV: 92 fL (ref 78.0–100.0)
MONO ABS: 0.1 10*3/uL (ref 0.1–1.0)
MONOS PCT: 1 % — AB (ref 3–12)
NEUTROS PCT: 93 % — AB (ref 43–77)
Neutro Abs: 7 10*3/uL (ref 1.7–7.7)
Platelets: 218 10*3/uL (ref 150–400)
RBC: 4.39 MIL/uL (ref 3.87–5.11)
RDW: 13 % (ref 11.5–15.5)
WBC: 7.5 10*3/uL (ref 4.0–10.5)

## 2014-01-04 LAB — RAPID URINE DRUG SCREEN, HOSP PERFORMED
Amphetamines: POSITIVE — AB
BARBITURATES: NOT DETECTED
BENZODIAZEPINES: NOT DETECTED
COCAINE: NOT DETECTED
OPIATES: POSITIVE — AB
Tetrahydrocannabinol: POSITIVE — AB

## 2014-01-04 LAB — HEMOGLOBIN A1C
HEMOGLOBIN A1C: 11.2 % — AB (ref <5.7)
MEAN PLASMA GLUCOSE: 275 mg/dL — AB (ref <117)

## 2014-01-04 LAB — GLUCOSE, CAPILLARY
GLUCOSE-CAPILLARY: 334 mg/dL — AB (ref 70–99)
Glucose-Capillary: 295 mg/dL — ABNORMAL HIGH (ref 70–99)
Glucose-Capillary: 340 mg/dL — ABNORMAL HIGH (ref 70–99)
Glucose-Capillary: 359 mg/dL — ABNORMAL HIGH (ref 70–99)
Glucose-Capillary: 376 mg/dL — ABNORMAL HIGH (ref 70–99)

## 2014-01-04 LAB — TROPONIN I
Troponin I: <0.3 ng/mL (ref <0.30)
Troponin I: <0.3 ng/mL (ref <0.30)

## 2014-01-04 LAB — HIV ANTIBODY (ROUTINE TESTING W REFLEX): HIV: NONREACTIVE

## 2014-01-04 LAB — LACTIC ACID, PLASMA: Lactic Acid, Venous: 4.9 mmol/L — ABNORMAL HIGH (ref 0.5–2.2)

## 2014-01-04 LAB — STREP PNEUMONIAE URINARY ANTIGEN: STREP PNEUMO URINARY ANTIGEN: NEGATIVE

## 2014-01-04 MED ORDER — METHYLPREDNISOLONE SODIUM SUCC 125 MG IJ SOLR
60.0000 mg | Freq: Three times a day (TID) | INTRAMUSCULAR | Status: DC
Start: 2014-01-04 — End: 2014-01-05
  Administered 2014-01-04 – 2014-01-05 (×5): 60 mg via INTRAVENOUS
  Filled 2014-01-04 (×8): qty 0.96

## 2014-01-04 MED ORDER — ONDANSETRON HCL 4 MG PO TABS: 4.0000 mg | ORAL_TABLET | Freq: Three times a day (TID) | ORAL | Status: DC | PRN

## 2014-01-04 MED ORDER — MIRTAZAPINE 45 MG PO TABS
45.0000 mg | ORAL_TABLET | Freq: Every day | ORAL | Status: DC
  Administered 2014-01-04 (×2): 45 mg via ORAL
  Filled 2014-01-04 (×3): qty 1

## 2014-01-04 MED ORDER — NABUMETONE 750 MG PO TABS
750.0000 mg | ORAL_TABLET | ORAL | Status: DC | PRN
  Filled 2014-01-04: qty 1

## 2014-01-04 MED ORDER — INSULIN GLARGINE 100 UNIT/ML ~~LOC~~ SOLN
25.0000 [IU] | Freq: Every day | SUBCUTANEOUS | Status: DC
  Administered 2014-01-04: 25 [IU] via SUBCUTANEOUS
  Filled 2014-01-04 (×2): qty 0.25

## 2014-01-04 MED ORDER — IPRATROPIUM-ALBUTEROL 0.5-2.5 (3) MG/3ML IN SOLN
3.0000 mL | RESPIRATORY_TRACT | Status: DC
Start: 2014-01-04 — End: 2014-01-04
  Administered 2014-01-04: 3 mL via RESPIRATORY_TRACT

## 2014-01-04 MED ORDER — HEPARIN SODIUM (PORCINE) 5000 UNIT/ML IJ SOLN
5000.0000 [IU] | Freq: Three times a day (TID) | INTRAMUSCULAR | Status: DC
Start: 2014-01-04 — End: 2014-01-05
  Administered 2014-01-04 – 2014-01-05 (×4): 5000 [IU] via SUBCUTANEOUS
  Filled 2014-01-04 (×8): qty 1

## 2014-01-04 MED ORDER — INSULIN GLARGINE 100 UNIT/ML ~~LOC~~ SOLN
10.0000 [IU] | Freq: Every day | SUBCUTANEOUS | Status: DC
  Administered 2014-01-04: 10 [IU] via SUBCUTANEOUS
  Filled 2014-01-04 (×2): qty 0.1

## 2014-01-04 MED ORDER — LEVOFLOXACIN 500 MG PO TABS
500.0000 mg | ORAL_TABLET | Freq: Every day | ORAL | Status: DC
  Administered 2014-01-05: 500 mg via ORAL
  Filled 2014-01-04: qty 1

## 2014-01-04 MED ORDER — ARIPIPRAZOLE 5 MG PO TABS
5.0000 mg | ORAL_TABLET | Freq: Every day | ORAL | Status: DC
  Administered 2014-01-04 – 2014-01-05 (×2): 5 mg via ORAL
  Filled 2014-01-04 (×2): qty 1

## 2014-01-04 MED ORDER — INSULIN ASPART 100 UNIT/ML ~~LOC~~ SOLN
0.0000 [IU] | Freq: Three times a day (TID) | SUBCUTANEOUS | Status: DC
  Administered 2014-01-04: 7 [IU] via SUBCUTANEOUS
  Administered 2014-01-04: 9 [IU] via SUBCUTANEOUS
  Administered 2014-01-04 – 2014-01-05 (×2): 7 [IU] via SUBCUTANEOUS

## 2014-01-04 MED ORDER — NICOTINE 21 MG/24HR TD PT24
21.0000 mg | MEDICATED_PATCH | Freq: Every day | TRANSDERMAL | Status: DC
  Administered 2014-01-04 – 2014-01-05 (×2): 21 mg via TRANSDERMAL
  Filled 2014-01-04 (×2): qty 1

## 2014-01-04 MED ORDER — IPRATROPIUM-ALBUTEROL 0.5-2.5 (3) MG/3ML IN SOLN
3.0000 mL | RESPIRATORY_TRACT | Status: DC | PRN
  Filled 2014-01-04: qty 3

## 2014-01-04 MED ORDER — GUAIFENESIN 100 MG/5ML PO SYRP
200.0000 mg | ORAL_SOLUTION | ORAL | Status: DC | PRN
  Filled 2014-01-04: qty 10

## 2014-01-04 MED ORDER — FLUOXETINE HCL 20 MG PO CAPS
40.0000 mg | ORAL_CAPSULE | Freq: Every day | ORAL | Status: DC
  Administered 2014-01-05: 40 mg via ORAL
  Filled 2014-01-04 (×2): qty 2

## 2014-01-04 MED ORDER — ASPIRIN 81 MG PO CHEW
324.0000 mg | CHEWABLE_TABLET | Freq: Once | ORAL | Status: AC
  Administered 2014-01-04: 324 mg via ORAL
  Filled 2014-01-04: qty 4

## 2014-01-04 MED ORDER — ADULT MULTIVITAMIN W/MINERALS CH
1.0000 | ORAL_TABLET | Freq: Every day | ORAL | Status: DC
  Administered 2014-01-04 – 2014-01-05 (×2): 1 via ORAL
  Filled 2014-01-04 (×2): qty 1

## 2014-01-04 MED ORDER — LEVOFLOXACIN IN D5W 750 MG/150ML IV SOLN
750.0000 mg | INTRAVENOUS | Status: DC
  Administered 2014-01-04: 750 mg via INTRAVENOUS
  Filled 2014-01-04: qty 150

## 2014-01-04 MED ORDER — VILAZODONE HCL 40 MG PO TABS
40.0000 mg | ORAL_TABLET | Freq: Every day | ORAL | Status: DC
  Administered 2014-01-04 – 2014-01-05 (×2): 40 mg via ORAL
  Filled 2014-01-04 (×2): qty 1

## 2014-01-04 MED ORDER — MOMETASONE FURO-FORMOTEROL FUM 100-5 MCG/ACT IN AERO
2.0000 | INHALATION_SPRAY | Freq: Two times a day (BID) | RESPIRATORY_TRACT | Status: DC
  Administered 2014-01-04 – 2014-01-05 (×2): 2 via RESPIRATORY_TRACT
  Filled 2014-01-04: qty 8.8

## 2014-01-04 MED ORDER — CYCLOBENZAPRINE HCL 10 MG PO TABS
10.0000 mg | ORAL_TABLET | Freq: Every day | ORAL | Status: DC
  Administered 2014-01-04: 10 mg via ORAL
  Filled 2014-01-04 (×3): qty 1

## 2014-01-04 MED ORDER — DOXEPIN HCL 25 MG PO CAPS
25.0000 mg | ORAL_CAPSULE | Freq: Every evening | ORAL | Status: DC | PRN
  Filled 2014-01-04: qty 1

## 2014-01-04 MED ORDER — AMPHETAMINE-DEXTROAMPHET ER 10 MG PO CP24
20.0000 mg | ORAL_CAPSULE | Freq: Every day | ORAL | Status: DC
  Administered 2014-01-04 – 2014-01-05 (×2): 20 mg via ORAL
  Filled 2014-01-04 (×2): qty 2

## 2014-01-04 MED ORDER — PANTOPRAZOLE SODIUM 40 MG PO TBEC
40.0000 mg | DELAYED_RELEASE_TABLET | Freq: Every day | ORAL | Status: DC
  Administered 2014-01-04 – 2014-01-05 (×2): 40 mg via ORAL
  Filled 2014-01-04 (×3): qty 1

## 2014-01-04 MED ORDER — INSULIN ASPART 100 UNIT/ML ~~LOC~~ SOLN
0.0000 [IU] | Freq: Every day | SUBCUTANEOUS | Status: DC
  Administered 2014-01-04: 5 [IU] via SUBCUTANEOUS
  Administered 2014-01-04: 3 [IU] via SUBCUTANEOUS

## 2014-01-04 MED ORDER — LOPERAMIDE HCL 2 MG PO CAPS: 2.0000 mg | ORAL_CAPSULE | Freq: Four times a day (QID) | ORAL | Status: DC | PRN

## 2014-01-04 MED ORDER — HYDROCODONE-ACETAMINOPHEN 5-325 MG PO TABS
1.0000 | ORAL_TABLET | Freq: Four times a day (QID) | ORAL | Status: DC | PRN
  Administered 2014-01-04 (×2): 1 via ORAL
  Filled 2014-01-04 (×3): qty 1

## 2014-01-04 MED ORDER — ALPRAZOLAM 0.5 MG PO TABS
1.0000 mg | ORAL_TABLET | Freq: Every evening | ORAL | Status: DC | PRN
  Administered 2014-01-04: 1 mg via ORAL
  Filled 2014-01-04: qty 2

## 2014-01-04 MED ORDER — ALBUTEROL SULFATE (2.5 MG/3ML) 0.083% IN NEBU: 2.5000 mg | INHALATION_SOLUTION | RESPIRATORY_TRACT | Status: DC | PRN

## 2014-01-04 MED ORDER — HYDROCODONE-ACETAMINOPHEN 5-325 MG PO TABS
1.0000 | ORAL_TABLET | ORAL | Status: DC | PRN
  Administered 2014-01-04 – 2014-01-05 (×4): 1 via ORAL
  Filled 2014-01-04 (×5): qty 1

## 2014-01-04 MED ORDER — ATORVASTATIN CALCIUM 20 MG PO TABS
20.0000 mg | ORAL_TABLET | Freq: Every day | ORAL | Status: DC
  Administered 2014-01-04: 20 mg via ORAL
  Filled 2014-01-04 (×2): qty 1

## 2014-01-04 MED ORDER — IPRATROPIUM-ALBUTEROL 0.5-2.5 (3) MG/3ML IN SOLN
3.0000 mL | Freq: Three times a day (TID) | RESPIRATORY_TRACT | Status: DC
  Administered 2014-01-04 – 2014-01-05 (×4): 3 mL via RESPIRATORY_TRACT
  Filled 2014-01-04 (×4): qty 3

## 2014-01-04 NOTE — Progress Notes (Signed)
Patient noted to have toilet paper stuffed into the front of her underwear. She states that she has an open place on her lower abdomen (not reported to me last night on admission) that has been present for a year after some surgery for abdominal hernias. She says that it continues to drain, mostly in the morning and that it has been opened and drained by a doctor, but they will not work on it any further while she continues to smoke. The area is pinpoint and draining a scant amount of serous sanguineous fluid. Gauze pads provided. I spoke to [place] about the effects of smoking on tissue healing. She says that she would like to quit and we explored her motivations and possible methods for cessation. She is more confident that she can quit.

## 2014-01-04 NOTE — Progress Notes (Signed)
Utilization Review Completed.Dowell, Deborah T11/22/2015  

## 2014-01-04 NOTE — Progress Notes (Signed)
ANTIBIOTIC CONSULT NOTE - INITIAL  Pharmacy Consult for Levaquin Indication: bronchitis  Allergies  Allergen Reactions  . Penicillins Itching and Rash    All over the body.  . Contrast Media [Iodinated Diagnostic Agents] Nausea And Vomiting  . Doxycycline Nausea And Vomiting  . Erythromycin Nausea And Vomiting  . Metronidazole Hives and Rash    Patient Measurements: Height: 5\' 4"  (162.6 cm) Weight: 206 lb 1.6 oz (93.486 kg) IBW/kg (Calculated) : 54.7  Vital Signs: Temp: 97.8 F (36.6 C) (11/22 0605) Temp Source: Oral (11/22 0605) BP: 147/67 mmHg (11/22 0605) Pulse Rate: 101 (11/22 0605) Intake/Output from previous day: 11/21 0701 - 11/22 0700 In: -  Out: 800 [Urine:800] Intake/Output from this shift:    Labs:  Recent Labs  01/03/14 1949 01/04/14 0644  WBC 9.2 7.5  HGB 14.0 13.0  PLT 233 218  CREATININE 0.74 0.70   Estimated Creatinine Clearance: 83.9 mL/min (by C-G formula based on Cr of 0.7).    Microbiology: No results found for this or any previous visit (from the past 720 hour(s)).  Medical History: Past Medical History  Diagnosis Date  . Depression   . Arthritis   . 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 ADD (attention deficit disorder with hyperactivity)   . Hypertension   . COPD (chronic obstructive pulmonary disease)   . Asthma   . GERD (gastroesophageal reflux disease)   . Anxiety   . Bronchitis   . Gestational diabetes     Medications:  Scheduled:  . amphetamine-dextroamphetamine  20 mg Oral Daily  . ARIPiprazole  5 mg Oral Daily  . atorvastatin  20 mg Oral q1800  . cyclobenzaprine  10 mg Oral QHS  . FLUoxetine  40 mg Oral Daily  . heparin  5,000 Units Subcutaneous 3 times per day  . insulin aspart  0-5 Units Subcutaneous QHS  . insulin aspart  0-9 Units Subcutaneous TID WC  . insulin glargine  10 Units Subcutaneous QHS  . ipratropium-albuterol  3 mL Nebulization TID  . levofloxacin (LEVAQUIN) IV  750 mg Intravenous Q24H  .  methylPREDNISolone (SOLU-MEDROL) injection  60 mg Intravenous 3 times per day  . mirtazapine  45 mg Oral QHS  . multivitamin with minerals  1 tablet Oral Daily  . nicotine  21 mg Transdermal Daily  . pantoprazole  40 mg Oral Daily  . Vilazodone HCl  40 mg Oral Daily   Assessment: 58 yo F presented overnight with productive cough and SOB.  CXR shows possible bronchitis and pt started on Levaquin.  SCr 0.7, WBC wnl, afeb.  Goal of Therapy:  Appropriate antibiotic adjustment  Plan:  Guidelines recommend Levaquin 500mg  PO daily x 7 days for bronchitis.  Will adjust dose accordingly.  Pts CrCl > 50.  No further dosing adjustments anticipated.  Rx will sign off.  [employer], Pharm.D., BCPS Clinical Pharmacist Pager [phone] 01/04/2014 12:44 PM

## 2014-01-04 NOTE — Plan of Care (Signed)
Problem: Phase I Progression Outcomes Goal: Tolerating diet Outcome: Completed/Met Date Met:  01/04/14

## 2014-01-04 NOTE — Plan of Care (Signed)
Problem: Phase I Progression Outcomes Goal: Pain controlled Outcome: Completed/Met Date Met:  01/04/14

## 2014-01-04 NOTE — Plan of Care (Signed)
Problem: Phase I Progression Outcomes Goal: Dyspnea controlled at rest Outcome: Completed/Met Date Met:  01/04/14     

## 2014-01-04 NOTE — Progress Notes (Signed)
[facility] PROGRESS NOTE  [name] ZRA:[id number] DOB: 02-01-1956 DOA: 01/03/2014 PCP: Barbette Merino, MD  Assessment/Plan: 57 y.o. female with past medical history of COPD, asthma, GERD, depression, anxiety, hypertension, ADHD, hyperlipidemia, diabetes mellitus, tobacco abuse, who presented with productive cough and shortness of breath, and chest pain  1. COPD exacerbation; active smoker  -cont IV atx, steroids, bronchodilators; d/w patient, counseled to stop smoking  2. Acute on chronic hypoxic respiratory due to COPD' ; hypoxia resolved on nasal O2; as above 3. Chest pain prior to admission; atypical; ECG, trop x 2 negative; currently chest pain free; obtain echo  4. DM, unclear history; patient is also on prednisone for 4 month; patient is on lantus (15units, but reports increased to ? 45units)+PO meds -cont insulin, increased lantus (25units) ISS; adjust as needed; check ha1c;  5. Chronic pain, DJD; cont pain control;   Code Status: full Family Communication:  D/w patient (indicate person spoken with, relationship, and if by phone, the number) Disposition Plan: home 24-48 hrs    Consultants:  none  Procedures:  none  Antibiotics:  levolfoxacin 11/(indicate start date, and stop date if known)  HPI/Subjective: Alert, no chest pain  Objective: Filed Vitals:   01/04/14 0605  BP: 147/67  Pulse: 101  Temp: 97.8 F (36.6 C)  Resp: 18    Intake/Output Summary (Last 24 hours) at 01/04/14 1339 Last data filed at 01/04/14 5456  Gross per 24 hour  Intake      0 ml  Output    800 ml  Net   -800 ml   Filed Weights   01/04/14 0033  Weight: 93.486 kg (206 lb 1.6 oz)    Exam:   General:  alert  Cardiovascular: s1,s2 rrr  Respiratory: few wheezing in LL  Abdomen: soft, nt, nd  Musculoskeletal: no LE edema   Data Reviewed: Basic Metabolic Panel:  Recent Labs Lab 01/03/14 1949 01/04/14 0644  NA 132* 137  K 4.1 4.2  CL 91* 97  CO2 23 22   GLUCOSE 384* 354*  BUN 18 15  CREATININE 0.74 0.70  CALCIUM 9.1 8.7   Liver Function Tests:  Recent Labs Lab 01/04/14 0644  AST 17  ALT 17  ALKPHOS 70  BILITOT 0.3  PROT 6.9  ALBUMIN 3.1*   No results for input(s): LIPASE, AMYLASE in the last 168 hours. No results for input(s): AMMONIA in the last 168 hours. CBC:  Recent Labs Lab 01/03/14 1949 01/04/14 0644  WBC 9.2 7.5  NEUTROABS  --  7.0  HGB 14.0 13.0  HCT 42.6 40.4  MCV 88.9 92.0  PLT 233 218   Cardiac Enzymes:  Recent Labs Lab 01/04/14 0330 01/04/14 0644 01/04/14 1216  TROPONINI <0.30 <0.30 <0.30   BNP (last 3 results)  Recent Labs  01/03/14 1949  PROBNP 9.9   CBG:  Recent Labs Lab 01/04/14 0040 01/04/14 0636 01/04/14 1139  GLUCAP 376* 334* 340*    No results found for this or any previous visit (from the past 240 hour(s)).   Studies: Dg Chest 2 View  01/03/2014   CLINICAL DATA:  Short breath, cough and congestion  EXAM: CHEST  2 VIEW  COMPARISON:  12/21/2013  FINDINGS: Normal cardiac silhouette. There is linear markings at the left and right lung base. These are increased compared to prior. For lungs are clear. Lungs are hyperinflated. No acute osseous abnormality.  IMPRESSION: Linear markings in the lung base suggest bronchitis.   Electronically Signed   By: Suzy Bouchard  M.D.   On: 01/03/2014 21:35    Scheduled Meds: . amphetamine-dextroamphetamine  20 mg Oral Daily  . ARIPiprazole  5 mg Oral Daily  . atorvastatin  20 mg Oral q1800  . cyclobenzaprine  10 mg Oral QHS  . FLUoxetine  40 mg Oral Daily  . heparin  5,000 Units Subcutaneous 3 times per day  . insulin aspart  0-5 Units Subcutaneous QHS  . insulin aspart  0-9 Units Subcutaneous TID WC  . insulin glargine  10 Units Subcutaneous QHS  . ipratropium-albuterol  3 mL Nebulization TID  . [START ON 01/05/2014] levofloxacin  500 mg Oral Daily  . methylPREDNISolone (SOLU-MEDROL) injection  60 mg Intravenous 3 times per day  .  mirtazapine  45 mg Oral QHS  . multivitamin with minerals  1 tablet Oral Daily  . nicotine  21 mg Transdermal Daily  . pantoprazole  40 mg Oral Daily  . Vilazodone HCl  40 mg Oral Daily   Continuous Infusions: . sodium chloride 125 mL/hr at 01/04/14 1120    Principal Problem:   COPD exacerbation Active Problems:   GERD (gastroesophageal reflux disease)   Anxiety   Bronchitis   Chest pain   Depression   Hypertension   Asthma   Diabetes mellitus without complication   HLD (hyperlipidemia)   ADHD (attention deficit hyperactivity disorder)    Time spent: >35 minutes     Kinnie Feil  [facility] Pager [phone]. If 7PM-7AM, please contact night-coverage at [email or web address], password [facility] 01/04/2014, 1:39 PM  LOS: 1 day

## 2014-01-05 DIAGNOSIS — R072 Precordial pain: Secondary | ICD-10-CM

## 2014-01-05 LAB — LEGIONELLA ANTIGEN, URINE

## 2014-01-05 LAB — RESPIRATORY VIRUS PANEL
Adenovirus: NOT DETECTED
INFLUENZA A H3: NOT DETECTED
INFLUENZA B 1: NOT DETECTED
Influenza A H1: NOT DETECTED
Influenza A: NOT DETECTED
Metapneumovirus: NOT DETECTED
PARAINFLUENZA 2 A: NOT DETECTED
PARAINFLUENZA 3 A: NOT DETECTED
Parainfluenza 1: NOT DETECTED
RHINOVIRUS: NOT DETECTED
Respiratory Syncytial Virus A: NOT DETECTED
Respiratory Syncytial Virus B: NOT DETECTED

## 2014-01-05 LAB — GLUCOSE, CAPILLARY: Glucose-Capillary: 314 mg/dL — ABNORMAL HIGH (ref 70–99)

## 2014-01-05 MED ORDER — PREDNISONE 10 MG PO TABS: ORAL_TABLET | ORAL | Status: DC

## 2014-01-05 MED ORDER — ALPRAZOLAM 2 MG PO TABS: 1.0000 mg | ORAL_TABLET | Freq: Every evening | ORAL | Status: DC | PRN

## 2014-01-05 MED ORDER — NICOTINE 21 MG/24HR TD PT24: 21.0000 mg | MEDICATED_PATCH | Freq: Every day | TRANSDERMAL | Status: DC

## 2014-01-05 MED ORDER — ALBUTEROL SULFATE (2.5 MG/3ML) 0.083% IN NEBU: 2.5000 mg | INHALATION_SOLUTION | RESPIRATORY_TRACT | Status: DC | PRN

## 2014-01-05 MED ORDER — LEVOFLOXACIN 500 MG PO TABS: 500.0000 mg | ORAL_TABLET | Freq: Every day | ORAL | Status: DC

## 2014-01-05 MED ORDER — HYDROCODONE-ACETAMINOPHEN 5-325 MG PO TABS: 1.0000 | ORAL_TABLET | Freq: Four times a day (QID) | ORAL | Status: DC | PRN

## 2014-01-05 NOTE — Progress Notes (Signed)
Echo Lab  2D Echocardiogram completed.  [place], RDCS 01/05/2014 9:57 AM

## 2014-01-05 NOTE — Discharge Summary (Signed)
Physician Discharge Summary  [name] JGG:[id number] DOB: Oct 26, 1955 DOA: 01/03/2014  PCP: Barbette Merino, MD  Admit date: 01/03/2014 Discharge date: 01/05/2014  Recommendations for Outpatient Follow-up:  1. Pt will need to follow up with PCP in 2-3 weeks post discharge 2. Please obtain BMP to evaluate electrolytes and kidney function 3. Please also check CBC to evaluate Hg and Hct levels 4. Please note that pt was discharged on Prednisone taper as well Levaquin as noted below   Discharge Diagnoses: COPD Principal Problem:   COPD exacerbation Active Problems:   GERD (gastroesophageal reflux disease)   Anxiety   Bronchitis   Chest pain   Depression   Hypertension   Asthma   Diabetes mellitus without complication   HLD (hyperlipidemia)   ADHD (attention deficit hyperactivity disorder)   Discharge Condition: Stable  Diet recommendation: Heart healthy diet discussed in details   Assessment/Plan: 58 y.o. female with past medical history of COPD, asthma, GERD, depression, anxiety, hypertension, ADHD, hyperlipidemia, diabetes mellitus, tobacco abuse, who presented with productive cough and shortness of breath, and chest pain  1. COPD exacerbation; active smoker - pt started on IV ABX and has responded well, ABX transitioned to Levaquin upon discharge, counseled about smoking cessation, continue Prednisone tapering as noted below  2. Acute on chronic hypoxic respiratory due to COPD' ; hypoxia resolved on nasal O2; pt already on O2 at home 2-3 liters 3. Chest pain prior to admission; atypical; ECG, trop x 2 negative; currently chest pain free; will cancel 2 D ECHO as no need for this  4. DM, unclear history; patient is also on prednisone for 4 month; patient is on lantus (15units, but reports increased to ? 45units)+PO meds, continue home medical regimen and follow up with PCP  5. Chronic pain, DJD; cont pain control;   Code Status: full Family Communication: D/w patient   Disposition Plan: home today    Consultants:  none Antibiotics:  levolfoxacin  Procedures/Studies: Dg Chest 2 View  01/03/2014   CLINICAL DATA:  Short breath, cough and congestion  EXAM: CHEST  2 VIEW  COMPARISON:  12/21/2013  FINDINGS: Normal cardiac silhouette. There is linear markings at the left and right lung base. These are increased compared to prior. For lungs are clear. Lungs are hyperinflated. No acute osseous abnormality.  IMPRESSION: Linear markings in the lung base suggest bronchitis.   Electronically Signed   By: Suzy Bouchard M.D.   On: 01/03/2014 21:35   Dg Chest Port 1 View  12/21/2013   CLINICAL DATA:  58 year old female with left chest and arm pain accompanied by shortness of breath, nausea and bilateral lower extremity numbness. Medical history of COPD, diabetes and hypertension.  EXAM: PORTABLE CHEST - 1 VIEW  COMPARISON:  Prior chest x-ray 12/15/2013  FINDINGS: Cardiac and mediastinal contours remain unchanged in within normal limits. No significant interval change in the appearance of the lungs. No focal airspace consolidation, pleural effusion, pneumothorax or evidence of pulmonary edema. Incompletely imaged anterior cervical fixation hardware at C5-C6. No acute osseous abnormality.  IMPRESSION: No active disease.   Electronically Signed   By: Jacqulynn Cadet M.D.   On: 12/21/2013 08:02   Dg Chest Portable 1 View  12/15/2013   CLINICAL DATA:  Left-sided chest pain with upper extremity radicular symptoms.  EXAM: PORTABLE CHEST - 1 VIEW  COMPARISON:  May 24, 2010  FINDINGS: There is no edema or consolidation. The heart size and pulmonary vascularity are normal. No adenopathy. There is osteoarthritic change in the left  shoulder. There is postoperative change in the lower cervical spine region.  IMPRESSION: No edema or consolidation.   Electronically Signed   By: Lowella Grip M.D.   On: 12/15/2013 13:27    Discharge Exam: Filed Vitals:   01/05/14 0500  BP:  141/84  Pulse: 102  Temp: 97.8 F (36.6 C)  Resp: 18   Filed Vitals:   01/04/14 1944 01/04/14 2057 01/05/14 0500 01/05/14 0845  BP: 141/80  141/84   Pulse: 111  102   Temp: 97.9 F (36.6 C)  97.8 F (36.6 C)   TempSrc: Oral  Oral   Resp: 18  18   Height:      Weight:      SpO2: 91% 95% 95% 93%    General: Pt is alert, follows commands appropriately, not in acute distress Cardiovascular: Regular rate and rhythm, no rubs, no gallops Respiratory: Clear to auscultation bilaterally, no wheezing, diminished breath sounds at bases  Abdominal: Soft, non tender, non distended, bowel sounds +, no guarding Extremities: no cyanosis, pulses palpable bilaterally DP and PT Neuro: Grossly nonfocal  Discharge Instructions  Discharge Instructions    Diet - low sodium heart healthy    Complete by:  As directed      Increase activity slowly    Complete by:  As directed             Medication List    STOP taking these medications        FLUoxetine 20 MG capsule  Commonly known as:  PROZAC     loperamide 2 MG capsule  Commonly known as:  IMODIUM     MULTIVITAMIN PO     nabumetone 750 MG tablet  Commonly known as:  RELAFEN     omeprazole 20 MG capsule  Commonly known as:  PRILOSEC      TAKE these medications        albuterol 108 (90 BASE) MCG/ACT inhaler  Commonly known as:  PROVENTIL HFA;VENTOLIN HFA  Inhale 2 puffs into the lungs 2 (two) times daily as needed for wheezing.     albuterol (2.5 MG/3ML) 0.083% nebulizer solution  Commonly known as:  PROVENTIL  Take 3 mLs (2.5 mg total) by nebulization every 4 (four) hours as needed for wheezing or shortness of breath.     alprazolam 2 MG tablet  Commonly known as:  XANAX  Take 0.5 tablets (1 mg total) by mouth at bedtime as needed for sleep or anxiety (Can take up to 3 thimes daily prn). Patient takes 1/2 tablet by mouth four times a day     amphetamine-dextroamphetamine 10 MG 24 hr capsule  Commonly known as:  ADDERALL  XR  Take 10 mg by mouth daily.     amphetamine-dextroamphetamine 10 MG 24 hr capsule  Commonly known as:  ADDERALL XR  Take 10 mg by mouth 2 (two) times daily.     ARIPiprazole 5 MG tablet  Commonly known as:  ABILIFY  Take 5 mg by mouth daily.     atorvastatin 20 MG tablet  Commonly known as:  LIPITOR  Take 20 mg by mouth daily at 6 PM.     cyclobenzaprine 10 MG tablet  Commonly known as:  FLEXERIL  Take 10 mg by mouth at bedtime.     doxepin 25 MG capsule  Commonly known as:  SINEQUAN  Take 25 mg by mouth at bedtime as needed (for sleep).     glipiZIDE 10 MG tablet  Commonly known as:  GLUCOTROL  Take 10 mg by mouth daily before breakfast.     hydrochlorothiazide 25 MG tablet  Commonly known as:  HYDRODIURIL  Take 1 tablet (25 mg total) by mouth daily.     HYDROcodone-acetaminophen 5-325 MG per tablet  Commonly known as:  NORCO/VICODIN  Take 1 tablet by mouth every 6 (six) hours as needed for moderate pain.     insulin glargine 100 UNIT/ML injection  Commonly known as:  LANTUS  Inject 45 Units into the skin at bedtime.     levofloxacin 500 MG tablet  Commonly known as:  LEVAQUIN  Take 1 tablet (500 mg total) by mouth daily.     lisinopril 20 MG tablet  Commonly known as:  PRINIVIL,ZESTRIL  Take 20 mg by mouth daily.     metFORMIN 500 MG tablet  Commonly known as:  GLUCOPHAGE  Take 500 mg by mouth 2 (two) times daily with a meal.     nicotine 21 mg/24hr patch  Commonly known as:  NICODERM CQ - dosed in mg/24 hours  Place 1 patch (21 mg total) onto the skin daily.     ondansetron 4 MG tablet  Commonly known as:  ZOFRAN  Take 4 mg by mouth every 8 (eight) hours as needed for nausea or vomiting.     predniSONE 10 MG tablet  Commonly known as:  DELTASONE  Take 50 mg tablet today and taper down by 10 mg daily. Once down to 10 mg daily, continue taking 10 mg tablet daily as per previous regimen.     REMERON PO  Take 45 mg by mouth at bedtime.     VIIBRYD  40 MG Tabs  Generic drug:  Vilazodone HCl  Take 40 mg by mouth daily.     Vitamin D (Ergocalciferol) 50000 UNITS Caps capsule  Commonly known as:  DRISDOL  Take 50,000 Units by mouth every 7 (seven) days. On thursday           Follow-up Information    Follow up with Barbette Merino, MD.   Specialty:  Internal Medicine   Contact information:   [place] [id number] [phone]       Follow up with Faye Ramsay, MD.   Specialty:  Internal Medicine   Why:  As needed, If symptoms worsen   Contact information:   [address] [place] [address] [phone]        The results of significant diagnostics from this hospitalization (including imaging, microbiology, ancillary and laboratory) are listed below for reference.     Microbiology: No results found for this or any previous visit (from the past 240 hour(s)).   Labs: Basic Metabolic Panel:  Recent Labs Lab 01/03/14 1949 01/04/14 0644  NA 132* 137  K 4.1 4.2  CL 91* 97  CO2 23 22  GLUCOSE 384* 354*  BUN 18 15  CREATININE 0.74 0.70  CALCIUM 9.1 8.7   Liver Function Tests:  Recent Labs Lab 01/04/14 0644  AST 17  ALT 17  ALKPHOS 70  BILITOT 0.3  PROT 6.9  ALBUMIN 3.1*   CBC:  Recent Labs Lab 01/03/14 1949 01/04/14 0644  WBC 9.2 7.5  NEUTROABS  --  7.0  HGB 14.0 13.0  HCT 42.6 40.4  MCV 88.9 92.0  PLT 233 218   Cardiac Enzymes:  Recent Labs Lab 01/04/14 0330 01/04/14 0644 01/04/14 1216  TROPONINI <0.30 <0.30 <0.30   BNP: BNP (last 3 results)  Recent Labs  01/03/14 1949  PROBNP 9.9   CBG:  Recent Labs Lab 01/04/14 0636 01/04/14 1139 01/04/14 1610 01/04/14 2123 01/05/14 0647  GLUCAP 334* 340* 359* 295* 314*     SIGNED: Time coordinating discharge: Over 30 minutes  Faye Ramsay, MD  [facility] 01/05/2014, 9:58 AM Pager [phone]  If 7PM-7AM, please contact night-coverage [email or web address] Password  [email or web address]

## 2014-01-05 NOTE — Progress Notes (Signed)
Pt discharged home  Discharge instructions given & reviewed Eduction discussed  IV dc'd  Tele dc'd  Pt discharged via wheelchair with volunteer services All pt belongs at side.  Kathleen Argue S 11:48 AM

## 2014-01-05 NOTE — Discharge Instructions (Signed)

## 2014-01-06 ENCOUNTER — Ambulatory Visit (INDEPENDENT_AMBULATORY_CARE_PROVIDER_SITE_OTHER): Payer: Medicaid Other | Admitting: Cardiovascular Disease

## 2014-01-06 ENCOUNTER — Encounter: Payer: Self-pay | Admitting: Cardiovascular Disease

## 2014-01-06 VITALS — BP 170/86 | HR 88 | Ht 64.0 in | Wt 209.1 lb

## 2014-01-06 DIAGNOSIS — R079 Chest pain, unspecified: Secondary | ICD-10-CM

## 2014-01-06 DIAGNOSIS — I10 Essential (primary) hypertension: Secondary | ICD-10-CM

## 2014-01-06 DIAGNOSIS — E119 Type 2 diabetes mellitus without complications: Secondary | ICD-10-CM

## 2014-01-06 DIAGNOSIS — R072 Precordial pain: Secondary | ICD-10-CM

## 2014-01-06 NOTE — Assessment & Plan Note (Signed)
Atypical related to COPD exacerbation Multiple risk factors including DM, HTN, elevated lipids and smoking  F/U lexiscan myovue

## 2014-01-06 NOTE — Patient Instructions (Signed)
Your physician wants you to follow-up in:  [place] will receive a reminder letter in the mail two months in advance. If you don't receive a letter, please call our office to schedule the follow-up appointment. Your physician recommends that you continue on your current medications as directed. Please refer to the Current Medication list given to you today.  Your physician has requested that you have a lexiscan myoview. For further information please visit [email or web address]. Please follow instruction sheet, as given.

## 2014-01-06 NOTE — Assessment & Plan Note (Signed)
Discussed low carb diet.  Target hemoglobin A1c is 6.5 or less.  Continue current medications. Worse with steroids  May need to add meal coverage in future  F/U [place]

## 2014-01-06 NOTE — Assessment & Plan Note (Signed)
Well controlled.  Continue current medications and low sodium Dash type diet.    

## 2014-01-06 NOTE — Progress Notes (Signed)
Patient ID: [name], female   DOB: 1955-05-30, 58 y.o.   MRN: [id number]   58 yo recently hospitalized with congestion and chest pain   Past medical history of COPD, asthma, GERD, depression, anxiety, hypertension, ADHD, hyperlipidemia, diabetes mellitus, tobacco abuse, who presented with productive cough and shortness of breath, and chest pain.  Patient reports that she has been having cough, shortness of breath and chest pain in the past 1 to 2 weeks. She has greenish sputum production. Her chest pain is located is in the right side of the chest, constant, nonradiating. Her chest pain is pleuritic and aggravated by deep breath. She also has some mild nausea, but no vomiting, abdominal pain or diarrhea. No symptoms for UTI. She was evaluated in urgent care, and was suggested to come in the emergency room for further evaluation and treatment. Of note, patient was seen in ED on 11/8 because of chest pain. She was given referral to cardiologist.  She denies fever, chills, abdominal pain, diarrhea, dysuria, urgency, frequency, hematuria, skin rashes or leg swelling.  Work up in the ED demonstrates negative troponin; proBNP 9.9; no leukocytosis; possible bronchitis on chest x-ray. She is admitted to inpatient for further evaluation and treatment.  R/O Rx with antibiotics and d/c  Echo 11/23 normal EF 65%  No valve disease   She has smoked 1 cig since d/c yesterday  Her DM gets worse with steroids used to Rx her COPD   ROS: Denies fever, malais, weight loss, blurry vision, decreased visual acuity, cough, sputum, SOB, hemoptysis, pleuritic pain, palpitaitons, heartburn, abdominal pain, melena, lower extremity edema, claudication, or rash.  All other systems reviewed and negative   General: Affect appropriate Obese white female  HEENT: normal Neck supple with no adenopathy JVP normal no bruits no thyromegaly Lungs clear with no wheezing and good diaphragmatic motion Heart:  S1/S2 no  murmur,rub, gallop or click PMI normal Abdomen: benighn, BS positve, no tenderness, no AAA no bruit.  No HSM or HJR Distal pulses intact with no bruits No edema Neuro non-focal Skin warm and dry No muscular weakness  Medications Current Outpatient Prescriptions  Medication Sig Dispense Refill  . albuterol (PROVENTIL HFA;VENTOLIN HFA) 108 (90 BASE) MCG/ACT inhaler Inhale 2 puffs into the lungs 2 (two) times daily as needed for wheezing.    Marland Kitchen albuterol (PROVENTIL) (2.5 MG/3ML) 0.083% nebulizer solution Take 3 mLs (2.5 mg total) by nebulization every 4 (four) hours as needed for wheezing or shortness of breath. 75 mL 3  . alprazolam (XANAX) 2 MG tablet Take 0.5 tablets (1 mg total) by mouth at bedtime as needed for sleep or anxiety (Can take up to 3 thimes daily prn). Patient takes 1/2 tablet by mouth four times a day 30 tablet 0  . amphetamine-dextroamphetamine (ADDERALL XR) 10 MG 24 hr capsule Take 10 mg by mouth daily.    Marland Kitchen amphetamine-dextroamphetamine (ADDERALL XR) 10 MG 24 hr capsule Take 10 mg by mouth 2 (two) times daily.    . ARIPiprazole (ABILIFY) 5 MG tablet Take 5 mg by mouth daily.     Marland Kitchen atorvastatin (LIPITOR) 20 MG tablet Take 20 mg by mouth daily at 6 PM.    . cyclobenzaprine (FLEXERIL) 10 MG tablet Take 10 mg by mouth at bedtime.    Marland Kitchen doxepin (SINEQUAN) 25 MG capsule Take 25 mg by mouth at bedtime as needed (for sleep).    Marland Kitchen glipiZIDE (GLUCOTROL) 10 MG tablet Take 10 mg by mouth daily before breakfast.    .  hydrochlorothiazide (HYDRODIURIL) 25 MG tablet Take 1 tablet (25 mg total) by mouth daily. 30 tablet 0  . HYDROcodone-acetaminophen (NORCO/VICODIN) 5-325 MG per tablet Take 1 tablet by mouth every 6 (six) hours as needed for moderate pain. 30 tablet 0  . insulin glargine (LANTUS) 100 UNIT/ML injection Inject 45 Units into the skin at bedtime.    Marland Kitchen levofloxacin (LEVAQUIN) 500 MG tablet Take 1 tablet (500 mg total) by mouth daily. 5 tablet 0  . lisinopril (PRINIVIL,ZESTRIL)  20 MG tablet Take 20 mg by mouth daily.    . metFORMIN (GLUCOPHAGE) 500 MG tablet Take 500 mg by mouth 2 (two) times daily with a meal.    . Mirtazapine (REMERON PO) Take 45 mg by mouth at bedtime.     . nicotine (NICODERM CQ - DOSED IN MG/24 HOURS) 21 mg/24hr patch Place 1 patch (21 mg total) onto the skin daily. 28 patch 0  . ondansetron (ZOFRAN) 4 MG tablet Take 4 mg by mouth every 8 (eight) hours as needed for nausea or vomiting.    . predniSONE (DELTASONE) 10 MG tablet Take 50 mg tablet today and taper down by 10 mg daily. Once down to 10 mg daily, continue taking 10 mg tablet daily as per previous regimen. 30 tablet 0  . Vilazodone HCl (VIIBRYD) 40 MG TABS Take 40 mg by mouth daily.    . Vitamin D, Ergocalciferol, (DRISDOL) 50000 UNITS CAPS Take 50,000 Units by mouth every 7 (seven) days. On thursday    . [DISCONTINUED] clomiPRAMINE (ANAFRANIL) 25 MG capsule Take 25 mg by mouth at bedtime.      No current facility-administered medications for this visit.    Allergies Penicillins; Contrast media; Doxycycline; Erythromycin; and Metronidazole  Family History: Family History  Problem Relation Age of Onset  . COPD Father   . Cancer Father     lung  . Cancer Paternal Aunt     lung    Social History: History   Social History  . Marital Status: Widowed    Spouse Name: N/A    Number of Children: N/A  . Years of Education: N/A   Occupational History  . Not on file.   Social History Main Topics  . Smoking status: Current Every Day Smoker -- 1.00 packs/day    Types: Cigarettes  . Smokeless tobacco: Never Used  . Alcohol Use: No  . Drug Use: No  . Sexual Activity: Not on file   Other Topics Concern  . Not on file   Social History Narrative    Past Surgical History  Procedure Laterality Date  . Abdominal hysterectomy    . Spine surgery    . Vulvar excision  2006    Cancer in situ  . Oophorectomy  2006    cervical cancer  . Hernia repair      incisional/ventral  hernia repair  . Rectovaginal fistula closure  2012  . Colectomy      Past Medical History  Diagnosis Date  . Depression   . Arthritis   . 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 ADD (attention deficit disorder with hyperactivity)   . Hypertension   . COPD (chronic obstructive pulmonary disease)   . Asthma   . GERD (gastroesophageal reflux disease)   . Anxiety   . Bronchitis   . Gestational diabetes     Electrocardiogram:  SR rate 84 low voltage   Assessment and Plan

## 2014-01-06 NOTE — Assessment & Plan Note (Signed)
Major issue  She must stop smoking  Avoid steroid pulses if possible  F/u primary

## 2014-01-10 LAB — CULTURE, BLOOD (ROUTINE X 2)
Culture: NO GROWTH
Culture: NO GROWTH

## 2014-01-13 ENCOUNTER — Encounter (HOSPITAL_COMMUNITY): Payer: Medicaid Other

## 2014-01-14 ENCOUNTER — Encounter: Payer: Medicaid Other | Admitting: Cardiovascular Disease

## 2014-01-21 ENCOUNTER — Encounter (HOSPITAL_COMMUNITY): Payer: Medicaid Other | Attending: Cardiovascular Disease | Admitting: Radiology

## 2014-01-21 VITALS — BP 99/49 | HR 67 | Ht 64.0 in | Wt 196.0 lb

## 2014-01-21 DIAGNOSIS — R062 Wheezing: Secondary | ICD-10-CM

## 2014-01-21 DIAGNOSIS — R072 Precordial pain: Secondary | ICD-10-CM

## 2014-01-21 DIAGNOSIS — R079 Chest pain, unspecified: Secondary | ICD-10-CM | POA: Insufficient documentation

## 2014-01-21 DIAGNOSIS — E119 Type 2 diabetes mellitus without complications: Secondary | ICD-10-CM

## 2014-01-21 MED ORDER — TECHNETIUM TC 99M SESTAMIBI GENERIC - CARDIOLITE
33.0000 | Freq: Once | INTRAVENOUS | Status: AC | PRN
  Administered 2014-01-21: 33 via INTRAVENOUS

## 2014-01-27 ENCOUNTER — Encounter (HOSPITAL_COMMUNITY): Payer: Medicaid Other

## 2014-02-04 ENCOUNTER — Encounter (HOSPITAL_COMMUNITY): Payer: Medicaid Other

## 2014-02-10 ENCOUNTER — Encounter: Payer: Medicaid Other | Attending: Internal Medicine

## 2014-02-10 VITALS — Ht 62.0 in | Wt 199.2 lb

## 2014-02-10 DIAGNOSIS — Z713 Dietary counseling and surveillance: Secondary | ICD-10-CM | POA: Diagnosis not present

## 2014-02-10 DIAGNOSIS — E119 Type 2 diabetes mellitus without complications: Secondary | ICD-10-CM | POA: Diagnosis not present

## 2014-02-10 NOTE — Progress Notes (Signed)

## 2014-02-11 ENCOUNTER — Ambulatory Visit (HOSPITAL_COMMUNITY): Payer: Medicaid Other | Attending: Cardiology

## 2014-02-11 DIAGNOSIS — J449 Chronic obstructive pulmonary disease, unspecified: Secondary | ICD-10-CM | POA: Diagnosis not present

## 2014-02-11 DIAGNOSIS — R0989 Other specified symptoms and signs involving the circulatory and respiratory systems: Secondary | ICD-10-CM

## 2014-02-11 DIAGNOSIS — J45909 Unspecified asthma, uncomplicated: Secondary | ICD-10-CM | POA: Diagnosis not present

## 2014-02-11 DIAGNOSIS — R079 Chest pain, unspecified: Secondary | ICD-10-CM | POA: Insufficient documentation

## 2014-02-11 DIAGNOSIS — I10 Essential (primary) hypertension: Secondary | ICD-10-CM | POA: Diagnosis not present

## 2014-02-11 MED ORDER — TECHNETIUM TC 99M SESTAMIBI GENERIC - CARDIOLITE
30.0000 | Freq: Once | INTRAVENOUS | Status: AC | PRN
  Administered 2014-02-11: 30 via INTRAVENOUS

## 2014-02-11 MED ORDER — ALBUTEROL SULFATE (2.5 MG/3ML) 0.083% IN NEBU
5.0000 mg | INHALATION_SOLUTION | Freq: Once | RESPIRATORY_TRACT | Status: AC
  Administered 2014-02-11: 5 mg via RESPIRATORY_TRACT

## 2014-02-11 MED ORDER — REGADENOSON 0.4 MG/5ML IV SOLN
0.4000 mg | Freq: Once | INTRAVENOUS | Status: AC
  Administered 2014-02-11: 0.4 mg via INTRAVENOUS

## 2014-02-11 NOTE — Progress Notes (Signed)
[facility] [address] [place], [place] [address] [phone]    Cardiology Nuclear Med Study  [name] is a 58 y.o. female     MRN : [id number]     DOB: December 04, 1955  Procedure Date: 02/11/2014  Nuclear Med Background Indication for Stress Test:  Evaluation for Ischemia History:  Asthma, COPD Cardiac Risk Factors: Hypertension  Symptoms:  Chest Pain (last date of chest discomfort was two months ago), DOE and SOB   Nuclear Pre-Procedure Caffeine/Decaff Intake:  One sip (tablespoon) Dr.pepper at 11:30am NPO After: 11:30am   Lungs:  clear O2 Sat: 95% on 2L O2 IV 0.9% NS with Angio Cath:  22g  IV Site: R Antecubital x 1, tolerated well IV Started by:  Irven Baltimore, RN  Chest Size (in):  38 Cup Size: B  Height: 5\' 4"  (1.626 m)  Weight:  196 lb (88.905 kg)  BMI:  Body mass index is 33.63 kg/(m^2). Tech Comments:  Fasting CBG was 160 at 0700 today. Last dose of insulin was 36 hrs ago per patient. Baseline expiratory wheezing and rhonchi, O2 sat 98% RA.Nebulizer treatment with albuterol 5 mg solution given via mask with 8L oxygen with much improvement of symptoms. Irven Baltimore, RN.    Nuclear Med Study 1 or 2 day study: 2 day  Stress Test Type:  Carlton Adam  Reading MD: N/A  Order Authorizing Provider:  Jenkins Rouge, MD. Irven Baltimore, RN.  Resting Radionuclide: Technetium 61m Sestamibi  Resting Radionuclide Dose: 33.0 mCi on 01/21/14  Stress Radionuclide:  Technetium 85m Sestamibi  Stress Radionuclide Dose: 33.0 mCi on 02/11/14          Stress Protocol Rest HR: 67 Stress HR: 89  Rest BP: 99/49 Stress BP: 96/56  Exercise Time (min): n/a METS: n/a   Predicted Max HR: 161 bpm % Max HR: 55.28 bpm Rate Pressure Product: 9345   Dose of Adenosine (mg):  n/a Dose of Lexiscan: 0.4 mg  Dose of Atropine (mg): n/a Dose of Dobutamine: n/a mcg/kg/min (at max HR)  Stress Test Technologist: Glade Lloyd, BS-ES  Nuclear Technologist:  Annye Rusk,  CNMT     Rest Procedure:  Myocardial perfusion imaging was performed at rest 45 minutes following the intravenous administration of Technetium 57m Sestamibi. Rest ECG: NSR with non-specific ST-T wave changes  Stress Procedure:  The patient received IV Lexiscan 0.4 mg over 15-seconds.  Technetium 41m Sestamibi injected at 30-seconds.  Quantitative spect images were obtained after a 45 minute delay.  During the infusion of Lexiscan the patient complained of headache and chest pressure.  The chest pressure resolved in recovery but the headache remained.  Stress ECG: No significant ST segment change suggestive of ischemia.  QPS Raw Data Images:  Normal; no motion artifact; normal heart/lung ratio. Stress Images:  Normal homogeneous uptake in all areas of the myocardium. Rest Images:  Normal homogeneous uptake in all areas of the myocardium. Subtraction (SDS):  No evidence of ischemia. Transient Ischemic Dilatation (Normal <1.22):  1.09 Lung/Heart Ratio (Normal <0.45):  .21  Quantitative Gated Spect Images QGS EDV:  91 ml QGS ESV:  25 ml  Impression Exercise Capacity:  Lexiscan with no exercise. BP Response:  Normal blood pressure response. Clinical Symptoms:  Mild chest pain/dyspnea. ECG Impression:  No significant ST segment change suggestive of ischemia. Comparison with Prior Nuclear Study: No previous nuclear study performed  Overall Impression:  Normal stress nuclear study.  LV Ejection Fraction: 73%.  LV Wall Motion:  NL LV Function; NL Wall Motion  Darlin Coco MD

## 2014-02-17 ENCOUNTER — Encounter: Payer: Medicaid Other | Attending: Internal Medicine

## 2014-02-17 DIAGNOSIS — E119 Type 2 diabetes mellitus without complications: Secondary | ICD-10-CM | POA: Diagnosis not present

## 2014-02-17 DIAGNOSIS — Z713 Dietary counseling and surveillance: Secondary | ICD-10-CM | POA: Diagnosis not present

## 2014-02-17 NOTE — Progress Notes (Signed)

## 2014-02-24 ENCOUNTER — Ambulatory Visit: Payer: Medicaid Other

## 2014-03-05 ENCOUNTER — Ambulatory Visit: Payer: Medicaid Other

## 2014-04-22 ENCOUNTER — Emergency Department (HOSPITAL_COMMUNITY)
Admission: EM | Admit: 2014-04-22 | Discharge: 2014-04-22 | Disposition: A | Discharge status: Home / Self Care | Payer: Medicaid Other | Attending: Emergency Medicine | Admitting: Emergency Medicine

## 2014-04-22 ENCOUNTER — Encounter (HOSPITAL_COMMUNITY): Payer: Self-pay

## 2014-04-22 DIAGNOSIS — M542 Cervicalgia: Secondary | ICD-10-CM | POA: Diagnosis not present

## 2014-04-22 DIAGNOSIS — F419 Anxiety disorder, unspecified: Secondary | ICD-10-CM | POA: Diagnosis not present

## 2014-04-22 DIAGNOSIS — F329 Major depressive disorder, single episode, unspecified: Secondary | ICD-10-CM | POA: Insufficient documentation

## 2014-04-22 DIAGNOSIS — I10 Essential (primary) hypertension: Secondary | ICD-10-CM | POA: Insufficient documentation

## 2014-04-22 DIAGNOSIS — M791 Myalgia, unspecified site: Secondary | ICD-10-CM

## 2014-04-22 DIAGNOSIS — Z880 Allergy status to penicillin: Secondary | ICD-10-CM | POA: Insufficient documentation

## 2014-04-22 DIAGNOSIS — M1990 Unspecified osteoarthritis, unspecified site: Secondary | ICD-10-CM | POA: Insufficient documentation

## 2014-04-22 DIAGNOSIS — Z8632 Personal history of gestational diabetes: Secondary | ICD-10-CM | POA: Insufficient documentation

## 2014-04-22 DIAGNOSIS — Z720 Tobacco use: Secondary | ICD-10-CM | POA: Insufficient documentation

## 2014-04-22 DIAGNOSIS — Z792 Long term (current) use of antibiotics: Secondary | ICD-10-CM | POA: Diagnosis not present

## 2014-04-22 DIAGNOSIS — F909 Attention-deficit hyperactivity disorder, unspecified type: Secondary | ICD-10-CM | POA: Insufficient documentation

## 2014-04-22 DIAGNOSIS — J449 Chronic obstructive pulmonary disease, unspecified: Secondary | ICD-10-CM | POA: Insufficient documentation

## 2014-04-22 DIAGNOSIS — K219 Gastro-esophageal reflux disease without esophagitis: Secondary | ICD-10-CM | POA: Diagnosis not present

## 2014-04-22 DIAGNOSIS — Z794 Long term (current) use of insulin: Secondary | ICD-10-CM | POA: Diagnosis not present

## 2014-04-22 DIAGNOSIS — Z79899 Other long term (current) drug therapy: Secondary | ICD-10-CM | POA: Diagnosis not present

## 2014-04-22 LAB — CBG MONITORING, ED: GLUCOSE-CAPILLARY: 345 mg/dL — AB (ref 70–99)

## 2014-04-22 MED ORDER — CYCLOBENZAPRINE HCL 10 MG PO TABS: 5.0000 mg | ORAL_TABLET | Freq: Every day | ORAL | Status: DC

## 2014-04-22 NOTE — Discharge Instructions (Signed)
Please call your doctor for a followup appointment within 24-48 hours. When you talk to your doctor please let them know that you were seen in the emergency department and have them acquire all of your records so that they can discuss the findings with you and formulate a treatment plan to fully care for your new and ongoing problems. Please call and set-up an appointment with your primary care provider Please call and follow-up with orthopedics Please rest and stay hydrated While taking muscle relaxers, this can cause drowsiness so please no drinking alcohol, driving, operating any heavy machinery. Please avoid any physical or strenuous activity Please apply heat and warm compressions-please massage Please continue to monitor symptoms closely and if symptoms are to worsen or change (fever greater than 101, chills, sweating, nausea, vomiting, chest pain, shortness of breathe, difficulty breathing, weakness, numbness, tingling, worsening or changes to pain pattern, fall, injury, dizziness, blurred vision, sudden loss of vision, loss of sensation to left arm, jaw pain, inability to swallow) please report back to the Emergency Department immediately.   Muscle Pain Muscle pain (myalgia) may be caused by many things, including:  Overuse or muscle strain, especially if you are not in shape. This is the most common cause of muscle pain.  Injury.  Bruises.  Viruses, such as the flu.  Infectious diseases.  Fibromyalgia, which is a chronic condition that causes muscle tenderness, fatigue, and headache.  Autoimmune diseases, including lupus.  Certain drugs, including ACE inhibitors and statins. Muscle pain may be mild or severe. In most cases, the pain lasts only a short time and goes away without treatment. To diagnose the cause of your muscle pain, your health care provider will take your medical history. This means he or she will ask you when your muscle pain began and what has been happening. If  you have not had muscle pain for very long, your health care provider may want to wait before doing much testing. If your muscle pain has lasted a long time, your health care provider may want to run tests right away. If your health care provider thinks your muscle pain may be caused by illness, you may need to have additional tests to rule out certain conditions.  Treatment for muscle pain depends on the cause. Home care is often enough to relieve muscle pain. Your health care provider may also prescribe anti-inflammatory medicine. HOME CARE INSTRUCTIONS Watch your condition for any changes. The following actions may help to lessen any discomfort you are feeling:  Only take over-the-counter or prescription medicines as directed by your health care provider.  Apply ice to the sore muscle:  Put ice in a plastic bag.  Place a towel between your skin and the bag.  Leave the ice on for 15-20 minutes, 3-4 times a day.  You may alternate applying hot and cold packs to the muscle as directed by your health care provider.  If overuse is causing your muscle pain, slow down your activities until the pain goes away.  Remember that it is normal to feel some muscle pain after starting a workout program. Muscles that have not been used often will be sore at first.  Do regular, gentle exercises if you are not usually active.  Warm up before exercising to lower your risk of muscle pain.  Do not continue working out if the pain is very bad. Bad pain could mean you have injured a muscle. SEEK MEDICAL CARE IF:  Your muscle pain gets worse, and medicines do  not help.  You have muscle pain that lasts longer than 3 days.  You have a rash or fever along with muscle pain.  You have muscle pain after a tick bite.  You have muscle pain while working out, even though you are in good physical condition.  You have redness, soreness, or swelling along with muscle pain.  You have muscle pain after starting  a new medicine or changing the dose of a medicine. SEEK IMMEDIATE MEDICAL CARE IF:  You have trouble breathing.  You have trouble swallowing.  You have muscle pain along with a stiff neck, fever, and vomiting.  You have severe muscle weakness or cannot move part of your body. MAKE SURE YOU:   Understand these instructions.  Will watch your condition.  Will get help right away if you are not doing well or get worse. Document Released: 12/22/2005 Document Revised: 02/04/2013 Document Reviewed: 11/26/2012 [facility] Patient Information 2015 [place], [place]. This information is not intended to replace advice given to you by your health care provider. Make sure you discuss any questions you have with your health care provider.

## 2014-04-22 NOTE — ED Provider Notes (Signed)
CSN: [id number]     Arrival date & time 04/22/14  0612 History   First MD Initiated Contact with Patient 04/22/14 [phone]     Chief Complaint  Patient presents with  . Neck Pain     (Consider location/radiation/quality/duration/timing/severity/associated sxs/prior Treatment) The history is provided by the patient. No language interpreter was used.  [name] is a 59 y/o F with PMHx of depression, arthritis, colovaginal fistula, ADD, DM, COPD on oxygen therapy prn, asthma, GERD, anxiety, bronchitis presenting to the ED with neck pain that started yesterday morning. Patient reported that Monday evening, at approximately 5:00PM, reported that she was lifting boxes of plates. Reported that on Tuesday morning, 5:00AM - stated that she started to experience severe left sided neck pain described as a sharp, shooting, grabbing pain. Stated that the discomfort radiates to the back of her head resulting in an occipital headache - reported that the headache got progressively worse with time. Patient reported that the pain worsens with turning her head towards the left described as a soreness sensation. Reported that she has been having mild tingling in her left hand - stated that this has been chronic since patient was due to get a shoulder replacement, but opts not to. Patient reported that she has been taking Vicodin without relief. Patient reported that she did have surgery to her cervical spine at least 7 years ago by Dr. Carloyn Manner - reported that since this surgery she has had neck pain secondary to the surgeon hitting the nerve resulting in continuous neck pain. Denied blurred vision, sudden loss of vision, head injury, fainting, difficulty swallowing, issues with speech, chest pain, shortness of breath, difficulty breathing, weakness, decreased grip strength, fall. PCP Dr. Jonelle Sidle  Past Medical History  Diagnosis Date  . Depression   . Arthritis   . 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 ADD (attention deficit  disorder with hyperactivity)   . Hypertension   . COPD (chronic obstructive pulmonary disease)   . Asthma   . GERD (gastroesophageal reflux disease)   . Anxiety   . Bronchitis   . Gestational diabetes    Past Surgical History  Procedure Laterality Date  . Abdominal hysterectomy    . Spine surgery    . Vulvar excision  2006    Cancer in situ  . Oophorectomy  2006    cervical cancer  . Hernia repair      incisional/ventral hernia repair  . Rectovaginal fistula closure  2012  . Colectomy     Family History  Problem Relation Age of Onset  . COPD Father   . Cancer Father     lung  . Cancer Paternal Aunt     lung   History  Substance Use Topics  . Smoking status: Current Every Day Smoker -- 1.00 packs/day    Types: Cigarettes  . Smokeless tobacco: Never Used  . Alcohol Use: No   OB History    No data available     Review of Systems  Constitutional: Negative for fever and chills.  Eyes: Negative for visual disturbance.  Respiratory: Negative for chest tightness and shortness of breath.   Cardiovascular: Negative for chest pain.  Gastrointestinal: Negative for nausea, vomiting and abdominal pain.  Musculoskeletal: Positive for neck pain. Negative for back pain and neck stiffness.  Neurological: Negative for dizziness, weakness, numbness and headaches.      Allergies  Penicillins; Contrast media; Doxycycline; Erythromycin; and Metronidazole  Home Medications   Prior to Admission medications  Medication Sig Start Date End Date Taking? Authorizing Provider  albuterol (PROVENTIL HFA;VENTOLIN HFA) 108 (90 BASE) MCG/ACT inhaler Inhale 2 puffs into the lungs 2 (two) times daily as needed for wheezing.    Historical Provider, MD  albuterol (PROVENTIL) (2.5 MG/3ML) 0.083% nebulizer solution Take 3 mLs (2.5 mg total) by nebulization every 4 (four) hours as needed for wheezing or shortness of breath. 01/05/14   Theodis Blaze, MD  alprazolam Duanne Moron) 2 MG tablet Take 0.5  tablets (1 mg total) by mouth at bedtime as needed for sleep or anxiety (Can take up to 3 thimes daily prn). Patient takes 1/2 tablet by mouth four times a day 01/05/14   Theodis Blaze, MD  amphetamine-dextroamphetamine (ADDERALL XR) 10 MG 24 hr capsule Take 10 mg by mouth daily.    Historical Provider, MD  amphetamine-dextroamphetamine (ADDERALL XR) 10 MG 24 hr capsule Take 10 mg by mouth 2 (two) times daily.    Historical Provider, MD  ARIPiprazole (ABILIFY) 5 MG tablet Take 5 mg by mouth daily.     Historical Provider, MD  atorvastatin (LIPITOR) 20 MG tablet Take 20 mg by mouth daily at 6 PM.    Historical Provider, MD  cyclobenzaprine (FLEXERIL) 10 MG tablet Take 0.5 tablets (5 mg total) by mouth at bedtime. 04/22/14   Aloise Copus, PA-C  doxepin (SINEQUAN) 25 MG capsule Take 25 mg by mouth at bedtime as needed (for sleep).    Historical Provider, MD  glipiZIDE (GLUCOTROL) 10 MG tablet Take 10 mg by mouth daily before breakfast.    Historical Provider, MD  hydrochlorothiazide (HYDRODIURIL) 25 MG tablet Take 1 tablet (25 mg total) by mouth daily. 09/23/12   Salvatore Marvel, MD  HYDROcodone-acetaminophen (NORCO/VICODIN) 5-325 MG per tablet Take 1 tablet by mouth every 6 (six) hours as needed for moderate pain. 01/05/14   Theodis Blaze, MD  insulin glargine (LANTUS) 100 UNIT/ML injection Inject 45 Units into the skin at bedtime.    Historical Provider, MD  levofloxacin (LEVAQUIN) 500 MG tablet Take 1 tablet (500 mg total) by mouth daily. 01/05/14   Theodis Blaze, MD  lisinopril (PRINIVIL,ZESTRIL) 20 MG tablet Take 20 mg by mouth daily.    Historical Provider, MD  metFORMIN (GLUCOPHAGE) 500 MG tablet Take 500 mg by mouth 2 (two) times daily with a meal.    Historical Provider, MD  Mirtazapine (REMERON PO) Take 45 mg by mouth at bedtime.     Historical Provider, MD  nicotine (NICODERM CQ - DOSED IN MG/24 HOURS) 21 mg/24hr patch Place 1 patch (21 mg total) onto the skin daily. 01/05/14   Theodis Blaze, MD    ondansetron (ZOFRAN) 4 MG tablet Take 4 mg by mouth every 8 (eight) hours as needed for nausea or vomiting.    Historical Provider, MD  predniSONE (DELTASONE) 10 MG tablet Take 50 mg tablet today and taper down by 10 mg daily. Once down to 10 mg daily, continue taking 10 mg tablet daily as per previous regimen. 01/05/14   Theodis Blaze, MD  Vilazodone HCl (VIIBRYD) 40 MG TABS Take 40 mg by mouth daily.    Historical Provider, MD  Vitamin D, Ergocalciferol, (DRISDOL) 50000 UNITS CAPS Take 50,000 Units by mouth every 7 (seven) days. On thursday    Historical Provider, MD   BP 145/82 mmHg  Pulse 74  Temp(Src) 97.6 F (36.4 C) (Oral)  Resp 16  Ht 5\' 4"  (1.626 m)  Wt 195 lb (88.451 kg)  BMI 33.46  kg/m2  SpO2 94% Physical Exam  Constitutional: She is oriented to person, place, and time. She appears well-developed and well-nourished. No distress.  HENT:  Head: Normocephalic and atraumatic.  Mouth/Throat: Oropharynx is clear and moist. No oropharyngeal exudate.  Eyes: Conjunctivae and EOM are normal. Pupils are equal, round, and reactive to light. Right eye exhibits no discharge. Left eye exhibits no discharge.  Neck: Normal range of motion. Neck supple. Muscular tenderness present.    Negative swelling Negative deformities noted to the c-spine Tenderness upon palpation to the left trapezius muscle  Cardiovascular: Normal rate, regular rhythm and normal heart sounds.   Pulses:      Radial pulses are 2+ on the right side, and 2+ on the left side.  Pulmonary/Chest: Effort normal and breath sounds normal. No respiratory distress. She has no wheezes. She has no rales.  Musculoskeletal: Normal range of motion.  Full ROM to upper and lower extremities without difficulty noted, negative ataxia noted.  Neurological: She is alert and oriented to person, place, and time. No cranial nerve deficit. She exhibits normal muscle tone. Coordination normal.  Cranial nerves III-XII grossly intact Strength  5+/5+ to upper extremities bilaterally with resistance applied, equal distribution noted Equal grip strength bilaterally  Strength intact to MCP, PIP, DIP joints of left hand Interphalangeal joint spaces intake and strong Negative facial droop Negative slurred speech  Negative aphasia Negative arm drift Fine motor skills intact Patient follows commands well  Patient responds to questions appropriately   Skin: Skin is warm and dry. No rash noted. She is not diaphoretic. No erythema.  Psychiatric: She has a normal mood and affect. Her behavior is normal. Thought content normal.  Nursing note and vitals reviewed.   ED Course  Procedures (including critical care time) Labs Review Labs Reviewed  CBG MONITORING, ED - Abnormal; Notable for the following:    Glucose-Capillary 345 (*)    All other components within normal limits    Imaging Review No results found.   EKG Interpretation None       7:24 AM This provider spoke with attending physician, Dr. Eliane Decree in great detail. As per physician did not recommend imaging at this time. Recommended anti-inflammatories and muscle relaxers to be administered.  MDM   Final diagnoses:  Neck pain  Muscular pain    Medications - No data to display  Filed Vitals:   04/22/14 0630 04/22/14 0645 04/22/14 0715 04/22/14 0800  BP: 133/80 144/80 135/76 145/82  Pulse: 79 78 73 74  Temp:      TempSrc:      Resp: 18 16    Height:      Weight:      SpO2: 94% 95% 94% 94%   Patient presenting to the ED with neck pain that started yesterday after patient was lifting heavy boxes full of plates. Full range of motion to upper and lower extremities identified. Equal grip strength. Pulses palpable and strong. Sensation intact with differentiation sharp and dull touch. Negative focal neurological deficits. Full range of motion to the neck-negative midline tenderness. Discomfort upon palpation to the trapezius muscle. Discomfort upon palpation with  motion-appears to be muscular nature. Discussed case in great detail with attending physician who agrees to plan of care and agrees to discharge patient with muscle relaxer. CBG mildly elevated-patient did not take her Lantus last night-discussed importance of taking medications for proper control of diabetes. Doubt DKA. Doubt dissection. Patient stable, afebrile. Patient not septic appearing. Negative signs of respiratory distress.  Discharged patient. Discharge patient with muscle relaxer. Referred patient to PCP. Discussed with patient to avoid any physical or strenuous activity. Discussed with patient to apply warm compressions and massage. Referred to orthopedics. Discussed with patient to closely monitor symptoms and if symptoms are to worsen or change to report back to the ED - strict return instructions given.  Patient agreed to plan of care, understood, all questions answered.   [employer], PA-C 04/22/14 2244  Varney Biles, MD 04/24/14 [phone]

## 2014-04-22 NOTE — ED Notes (Signed)
Pt stated that she did not take her lantus yesterday evening because she wasn't feeling well.

## 2014-04-22 NOTE — ED Notes (Addendum)
Pt states that she picked up a box on Monday night and stated that she did something to her neck and has had neck pain radiating up the back of her head since. Pt stated that she had a neck fusion 7 years ago with "bone plate and screws" Pt alert and oriented and in no acute distress upon arrival to the room.

## 2014-04-30 ENCOUNTER — Other Ambulatory Visit (HOSPITAL_COMMUNITY): Payer: Self-pay | Admitting: Orthopedic Surgery

## 2014-04-30 DIAGNOSIS — M542 Cervicalgia: Secondary | ICD-10-CM

## 2014-05-04 ENCOUNTER — Ambulatory Visit
Admission: RE | Admit: 2014-05-04 | Discharge: 2014-05-04 | Disposition: A | Discharge status: Home / Self Care | Payer: Medicaid Other | Source: Ambulatory Visit | Attending: General Surgery | Admitting: General Surgery

## 2014-05-04 DIAGNOSIS — E041 Nontoxic single thyroid nodule: Secondary | ICD-10-CM

## 2014-05-08 ENCOUNTER — Ambulatory Visit (HOSPITAL_COMMUNITY)
Admission: RE | Admit: 2014-05-08 | Discharge: 2014-05-08 | Disposition: A | Discharge status: Home / Self Care | Payer: Medicaid Other | Source: Ambulatory Visit | Attending: Orthopedic Surgery | Admitting: Orthopedic Surgery

## 2014-05-08 DIAGNOSIS — M542 Cervicalgia: Secondary | ICD-10-CM

## 2014-12-11 ENCOUNTER — Other Ambulatory Visit: Payer: Self-pay | Admitting: Physician Assistant

## 2015-03-30 ENCOUNTER — Emergency Department (HOSPITAL_COMMUNITY): Payer: Medicare Other

## 2015-03-30 ENCOUNTER — Emergency Department (HOSPITAL_COMMUNITY)
Admission: EM | Admit: 2015-03-30 | Discharge: 2015-03-31 | Disposition: A | Discharge status: Home / Self Care | Payer: Medicare Other | Attending: Emergency Medicine | Admitting: Emergency Medicine

## 2015-03-30 ENCOUNTER — Encounter (HOSPITAL_COMMUNITY): Payer: Self-pay | Admitting: Emergency Medicine

## 2015-03-30 DIAGNOSIS — F909 Attention-deficit hyperactivity disorder, unspecified type: Secondary | ICD-10-CM | POA: Diagnosis not present

## 2015-03-30 DIAGNOSIS — M1990 Unspecified osteoarthritis, unspecified site: Secondary | ICD-10-CM | POA: Diagnosis not present

## 2015-03-30 DIAGNOSIS — J449 Chronic obstructive pulmonary disease, unspecified: Secondary | ICD-10-CM | POA: Diagnosis not present

## 2015-03-30 DIAGNOSIS — Z8742 Personal history of other diseases of the female genital tract: Secondary | ICD-10-CM | POA: Insufficient documentation

## 2015-03-30 DIAGNOSIS — Z79899 Other long term (current) drug therapy: Secondary | ICD-10-CM | POA: Diagnosis not present

## 2015-03-30 DIAGNOSIS — I10 Essential (primary) hypertension: Secondary | ICD-10-CM | POA: Diagnosis not present

## 2015-03-30 DIAGNOSIS — Z8719 Personal history of other diseases of the digestive system: Secondary | ICD-10-CM | POA: Diagnosis not present

## 2015-03-30 DIAGNOSIS — Z7984 Long term (current) use of oral hypoglycemic drugs: Secondary | ICD-10-CM | POA: Insufficient documentation

## 2015-03-30 DIAGNOSIS — F419 Anxiety disorder, unspecified: Secondary | ICD-10-CM | POA: Insufficient documentation

## 2015-03-30 DIAGNOSIS — F329 Major depressive disorder, single episode, unspecified: Secondary | ICD-10-CM | POA: Diagnosis not present

## 2015-03-30 DIAGNOSIS — F1110 Opioid abuse, uncomplicated: Secondary | ICD-10-CM | POA: Insufficient documentation

## 2015-03-30 DIAGNOSIS — Z7952 Long term (current) use of systemic steroids: Secondary | ICD-10-CM | POA: Diagnosis not present

## 2015-03-30 DIAGNOSIS — F1910 Other psychoactive substance abuse, uncomplicated: Secondary | ICD-10-CM | POA: Insufficient documentation

## 2015-03-30 DIAGNOSIS — F17210 Nicotine dependence, cigarettes, uncomplicated: Secondary | ICD-10-CM | POA: Diagnosis not present

## 2015-03-30 DIAGNOSIS — Z794 Long term (current) use of insulin: Secondary | ICD-10-CM | POA: Diagnosis not present

## 2015-03-30 DIAGNOSIS — Z792 Long term (current) use of antibiotics: Secondary | ICD-10-CM | POA: Insufficient documentation

## 2015-03-30 DIAGNOSIS — N390 Urinary tract infection, site not specified: Secondary | ICD-10-CM | POA: Diagnosis not present

## 2015-03-30 DIAGNOSIS — R4182 Altered mental status, unspecified: Secondary | ICD-10-CM | POA: Insufficient documentation

## 2015-03-30 DIAGNOSIS — Z880 Allergy status to penicillin: Secondary | ICD-10-CM | POA: Diagnosis not present

## 2015-03-30 DIAGNOSIS — Z8632 Personal history of gestational diabetes: Secondary | ICD-10-CM | POA: Insufficient documentation

## 2015-03-30 LAB — CBG MONITORING, ED: GLUCOSE-CAPILLARY: 118 mg/dL — AB (ref 65–99)

## 2015-03-30 LAB — CBC WITH DIFFERENTIAL/PLATELET
Basophils Absolute: 0.1 10*3/uL (ref 0.0–0.1)
Basophils Relative: 1 %
Eosinophils Absolute: 0.1 10*3/uL (ref 0.0–0.7)
Eosinophils Relative: 2 %
HCT: 46.5 % — ABNORMAL HIGH (ref 36.0–46.0)
HEMOGLOBIN: 15.9 g/dL — AB (ref 12.0–15.0)
LYMPHS ABS: 4.4 10*3/uL — AB (ref 0.7–4.0)
LYMPHS PCT: 49 %
MCH: 30.3 pg (ref 26.0–34.0)
MCHC: 34.2 g/dL (ref 30.0–36.0)
MCV: 88.7 fL (ref 78.0–100.0)
Monocytes Absolute: 0.5 10*3/uL (ref 0.1–1.0)
Monocytes Relative: 6 %
NEUTROS ABS: 3.6 10*3/uL (ref 1.7–7.7)
Neutrophils Relative %: 42 %
Platelets: 192 10*3/uL (ref 150–400)
RBC: 5.24 MIL/uL — ABNORMAL HIGH (ref 3.87–5.11)
RDW: 13.1 % (ref 11.5–15.5)
WBC: 8.7 10*3/uL (ref 4.0–10.5)

## 2015-03-30 LAB — ACETAMINOPHEN LEVEL: Acetaminophen (Tylenol), Serum: <10 ug/mL — ABNORMAL LOW (ref 10–30)

## 2015-03-30 LAB — COMPREHENSIVE METABOLIC PANEL
ALK PHOS: 57 U/L (ref 38–126)
ALT: 14 U/L (ref 14–54)
AST: 16 U/L (ref 15–41)
Albumin: 3.2 g/dL — ABNORMAL LOW (ref 3.5–5.0)
Anion gap: 14 (ref 5–15)
BUN: 7 mg/dL (ref 6–20)
CALCIUM: 9.1 mg/dL (ref 8.9–10.3)
CO2: 24 mmol/L (ref 22–32)
Chloride: 103 mmol/L (ref 101–111)
Creatinine, Ser: 0.76 mg/dL (ref 0.44–1.00)
Glucose, Bld: 107 mg/dL — ABNORMAL HIGH (ref 65–99)
Potassium: 3.1 mmol/L — ABNORMAL LOW (ref 3.5–5.1)
Sodium: 141 mmol/L (ref 135–145)
Total Bilirubin: 0.6 mg/dL (ref 0.3–1.2)
Total Protein: 6.3 g/dL — ABNORMAL LOW (ref 6.5–8.1)

## 2015-03-30 LAB — RAPID URINE DRUG SCREEN, HOSP PERFORMED
Amphetamines: NOT DETECTED
BARBITURATES: NOT DETECTED
BENZODIAZEPINES: POSITIVE — AB
Cocaine: NOT DETECTED
Opiates: POSITIVE — AB
TETRAHYDROCANNABINOL: NOT DETECTED

## 2015-03-30 LAB — SALICYLATE LEVEL

## 2015-03-30 LAB — URINALYSIS, ROUTINE W REFLEX MICROSCOPIC
Bilirubin Urine: NEGATIVE
GLUCOSE, UA: NEGATIVE mg/dL
HGB URINE DIPSTICK: NEGATIVE
Ketones, ur: NEGATIVE mg/dL
Nitrite: NEGATIVE
PH: 6 (ref 5.0–8.0)
PROTEIN: NEGATIVE mg/dL
Specific Gravity, Urine: 1.015 (ref 1.005–1.030)

## 2015-03-30 LAB — URINE MICROSCOPIC-ADD ON

## 2015-03-30 LAB — ETHANOL: Alcohol, Ethyl (B): <5 mg/dL (ref <5)

## 2015-03-30 LAB — I-STAT TROPONIN, ED: Troponin i, poc: 0.01 ng/mL (ref 0.00–0.08)

## 2015-03-30 MED ORDER — SODIUM CHLORIDE 0.9 % IV SOLN: INTRAVENOUS | Status: DC

## 2015-03-30 MED ORDER — SODIUM CHLORIDE 0.9 % IV BOLUS (SEPSIS)
500.0000 mL | Freq: Once | INTRAVENOUS | Status: AC
  Administered 2015-03-31: 500 mL via INTRAVENOUS

## 2015-03-30 NOTE — ED Provider Notes (Addendum)
By signing my name below, I, [name], attest that this documentation has been prepared under the direction and in the presence of [employer], DO. Electronically Signed: Soijett Diaz, ED Scribe. 03/30/2015. 11:15 PM.  TIME SEEN: 11:01 PM  CHIEF COMPLAINT: AMS  HPI: [name] is a 60 y.o. female with a medical hx of depression, anxiety, HTN, who presents to the Emergency Department via EMS complaining of AMS onset PTA. She notes that she lives with her granddaughter who was concerned about how the pt was acting today and called EMS for the pt to be brought into the ED for further evaluation. Patient states "I guess I was just hard to wake up tonight". Pt states that she was just started on effexor today and that she also takes nortriptyline, rexulti, and xanax. Pt notes that she takes 0.5 xanax 3 times a day and 1 xanax at night.  Pt is having associated symptoms of productive cough x 2-3 weeks, appetite change, and SOB. She notes that she has not tried any medications for the relief of her symptoms. She denies CP, HA, abdominal pain, fever, v/d, SI/HI, auditory/visual hallucinations, numbness, tingling, focal weakness, and any other symptoms. Pt notes that she has a psychiatrist and her PCP is Dr. Elwyn Reach.    ROS: See HPI Constitutional: no fever  Eyes: no drainage  ENT: no runny nose   Cardiovascular:  no chest pain  Resp: no SOB  GI: no vomiting GU: no dysuria Integumentary: no rash  Allergy: no hives  Musculoskeletal: no leg swelling  Neurological: no slurred speech ROS otherwise negative  PAST MEDICAL HISTORY/PAST SURGICAL HISTORY:  Past Medical History  Diagnosis Date  . Depression   . Arthritis   . 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 ADD (attention deficit disorder with hyperactivity)   . Hypertension   . COPD (chronic obstructive pulmonary disease) ([place])   . Asthma   . GERD (gastroesophageal reflux disease)   . Anxiety   . Bronchitis   . Gestational  diabetes     MEDICATIONS:  Prior to Admission medications   Medication Sig Start Date End Date Taking? Authorizing Provider  albuterol (PROVENTIL HFA;VENTOLIN HFA) 108 (90 BASE) MCG/ACT inhaler Inhale 2 puffs into the lungs 2 (two) times daily as needed for wheezing.    Historical Provider, MD  albuterol (PROVENTIL) (2.5 MG/3ML) 0.083% nebulizer solution Take 3 mLs (2.5 mg total) by nebulization every 4 (four) hours as needed for wheezing or shortness of breath. 01/05/14   Theodis Blaze, MD  alprazolam Duanne Moron) 2 MG tablet Take 0.5 tablets (1 mg total) by mouth at bedtime as needed for sleep or anxiety (Can take up to 3 thimes daily prn). Patient takes 1/2 tablet by mouth four times a day 01/05/14   Theodis Blaze, MD  amphetamine-dextroamphetamine (ADDERALL XR) 10 MG 24 hr capsule Take 10 mg by mouth daily.    Historical Provider, MD  amphetamine-dextroamphetamine (ADDERALL XR) 10 MG 24 hr capsule Take 10 mg by mouth 2 (two) times daily.    Historical Provider, MD  ARIPiprazole (ABILIFY) 5 MG tablet Take 5 mg by mouth daily.     Historical Provider, MD  atorvastatin (LIPITOR) 20 MG tablet Take 20 mg by mouth daily at 6 PM.    Historical Provider, MD  cyclobenzaprine (FLEXERIL) 10 MG tablet Take 0.5 tablets (5 mg total) by mouth at bedtime. 04/22/14   Marissa Sciacca, PA-C  doxepin (SINEQUAN) 25 MG capsule Take 25 mg by  mouth at bedtime as needed (for sleep).    Historical Provider, MD  glipiZIDE (GLUCOTROL) 10 MG tablet Take 10 mg by mouth daily before breakfast.    Historical Provider, MD  hydrochlorothiazide (HYDRODIURIL) 25 MG tablet Take 1 tablet (25 mg total) by mouth daily. 09/23/12   Gertie Exon, MD  HYDROcodone-acetaminophen (NORCO/VICODIN) 5-325 MG per tablet Take 1 tablet by mouth every 6 (six) hours as needed for moderate pain. 01/05/14   Theodis Blaze, MD  insulin glargine (LANTUS) 100 UNIT/ML injection Inject 45 Units into the skin at bedtime.    Historical Provider, MD  levofloxacin  (LEVAQUIN) 500 MG tablet Take 1 tablet (500 mg total) by mouth daily. 01/05/14   Theodis Blaze, MD  lisinopril (PRINIVIL,ZESTRIL) 20 MG tablet Take 20 mg by mouth daily.    Historical Provider, MD  metFORMIN (GLUCOPHAGE) 500 MG tablet Take 500 mg by mouth 2 (two) times daily with a meal.    Historical Provider, MD  Mirtazapine (REMERON PO) Take 45 mg by mouth at bedtime.     Historical Provider, MD  nicotine (NICODERM CQ - DOSED IN MG/24 HOURS) 21 mg/24hr patch Place 1 patch (21 mg total) onto the skin daily. 01/05/14   Theodis Blaze, MD  ondansetron (ZOFRAN) 4 MG tablet Take 4 mg by mouth every 8 (eight) hours as needed for nausea or vomiting.    Historical Provider, MD  predniSONE (DELTASONE) 10 MG tablet Take 50 mg tablet today and taper down by 10 mg daily. Once down to 10 mg daily, continue taking 10 mg tablet daily as per previous regimen. 01/05/14   Theodis Blaze, MD  Vilazodone HCl (VIIBRYD) 40 MG TABS Take 40 mg by mouth daily.    Historical Provider, MD  Vitamin D, Ergocalciferol, (DRISDOL) 50000 UNITS CAPS Take 50,000 Units by mouth every 7 (seven) days. On thursday    Historical Provider, MD    ALLERGIES:  Allergies  Allergen Reactions  . Penicillins Itching and Rash    All over the body.  . Contrast Media [Iodinated Diagnostic Agents] Nausea And Vomiting  . Doxycycline Nausea And Vomiting  . Erythromycin Nausea And Vomiting  . Metronidazole Hives and Rash    SOCIAL HISTORY:  Social History  Substance Use Topics  . Smoking status: Current Every Day Smoker -- 1.00 packs/day    Types: Cigarettes  . Smokeless tobacco: Never Used  . Alcohol Use: No    FAMILY HISTORY: Family History  Problem Relation Age of Onset  . COPD Father   . Cancer Father     lung  . Cancer Paternal Aunt     lung    EXAM: BP 145/101 mmHg  Pulse 102  Temp(Src) 98.3 F (36.8 C) (Oral)  Resp 18  Ht 5\' 5"  (1.651 m)  Wt 167 lb (75.751 kg)  BMI 27.79 kg/m2  SpO2 93% CONSTITUTIONAL: Alert and  oriented x 3 and responds appropriately to questions. Chronically ill appearing; well-nourished. Pt slow to answer questions. HEAD: Normocephalic EYES: Conjunctivae clear, PERRL ENT: normal nose; no rhinorrhea; dry mucous membranes; pharynx without lesions noted NECK: Supple, no meningismus, no LAD  CARD: Regular and tachycardic; S1 and S2 appreciated; no murmurs, no clicks, no rubs, no gallops RESP: Normal chest excursion without splinting or tachypnea; breath sounds clear and equal bilaterally; no wheezes, no rhonchi, no rales, no hypoxia or respiratory distress, speaking full sentences ABD/GI: Normal bowel sounds; non-distended; soft, non-tender, no rebound, no guarding, no peritoneal signs BACK:  The back  appears normal and is non-tender to palpation, there is no CVA tenderness EXT: Normal ROM in all joints; non-tender to palpation; no edema; normal capillary refill; no cyanosis, no calf tenderness or swelling    SKIN: Normal color for age and race; warm NEURO: Moves all extremities equally, sensation to light touch intact diffusely, cranial nerves II through XII intact, patient able to ambulate without any difficulty, no pronator drift PSYCH: The patient's mood and manner are appropriate. Grooming and personal hygiene are appropriate.  MEDICAL DECISION MAKING: Patient here with concerns for altered mental status, difficult to arouse. She is awake, alert, oriented and neurologically intact on exam. Mildly tachycardic with dry mucous membranes. Otherwise exam is completely nonfocal. We'll obtain labs, urine, chest x-ray, head CT to evaluate for any possible organic causes for her altered mental status. This may be related to her depression. She states that she does feel very "down". No SI or HI.  ED PROGRESS: Patient is still hemodynamically stable. Labs show potassium of 3.1, we'll give oral potassium replacement in the ED. Urine shows leukocytes and bacteria. May be possible UTI. Will send  culture. We'll give IV ceftriaxone. CT of her head shows no acute intracranial abnormality. She sells no focal neurologic deficits. Chest x-ray is clear. Have recommended the patient's granddaughter that they contact patient's psychiatrist as recent adjustment of psychiatric medications may be contributing to her drowsiness tonight (patient does take opiates, benzodiazepines and TCAs). I feel she is safe to be discharged home in granddaughter is comfortable with this plan. Patient lives with her granddaughter. Will discharge on Keflex. Discussed return precautions. Patient verbalizes understanding and is comfortable with this plan.   Patient has had some slightly low oxygen saturations with lying flat. When she is upright and awake her oxygen is normal. Lungs are clear. She does have a mild dry cough but chest x-ray shows no infiltrate, edema or pneumothorax. Suspect she may have some sleep apnea. She is able to ambulate and her sats do not drop below 91% with ambulation.     EKG Interpretation  Date/Time:  Tuesday March 30 2015 22:25:42 EST Ventricular Rate:  101 PR Interval:  139 QRS Duration: 105 QT Interval:  365 QTC Calculation: 473 R Axis:   82 Text Interpretation:  Sinus tachycardia Paired ventricular premature complexes Borderline right axis deviation Probable left ventricular hypertrophy No significant change since last tracing Confirmed by Steffan Caniglia,  DO, Idalee Foxworthy [id number]) on 03/30/2015 11:01:27 PM       I personally performed the services described in this documentation, which was scribed in my presence. The recorded information has been reviewed and is accurate.      [place], DO 03/31/15 [id number]

## 2015-03-30 NOTE — ED Notes (Signed)
Pt in EMS, getting different reports. Per EMS family stated pt has been acting different past 1-2 wks, became lethargic 4/5pm. Pt doubled up on depression meds per PCP yesterday. Family is stating besides her being depressed she has been "normal" until today at 4/5pm. Pt very slow to respond. EDP is at bedside

## 2015-03-31 DIAGNOSIS — R4182 Altered mental status, unspecified: Secondary | ICD-10-CM | POA: Diagnosis not present

## 2015-03-31 MED ORDER — POTASSIUM CHLORIDE CRYS ER 20 MEQ PO TBCR
40.0000 meq | EXTENDED_RELEASE_TABLET | Freq: Once | ORAL | Status: AC
Start: 2015-03-31 — End: 2015-03-31
  Administered 2015-03-31: 40 meq via ORAL
  Filled 2015-03-31: qty 2

## 2015-03-31 MED ORDER — DEXTROSE 5 % IV SOLN
1.0000 g | Freq: Once | INTRAVENOUS | Status: AC
  Administered 2015-03-31: 1 g via INTRAVENOUS
  Filled 2015-03-31: qty 10

## 2015-03-31 MED ORDER — CEPHALEXIN 500 MG PO CAPS: 500.0000 mg | ORAL_CAPSULE | Freq: Four times a day (QID) | ORAL | Status: DC

## 2015-03-31 NOTE — Discharge Instructions (Signed)
Urinary Tract Infection Urinary tract infections (UTIs) can develop anywhere along your urinary tract. Your urinary tract is your body's drainage system for removing wastes and extra water. Your urinary tract includes two kidneys, two ureters, a bladder, and a urethra. Your kidneys are a pair of bean-shaped organs. Each kidney is about the size of your fist. They are located below your ribs, one on each side of your spine. CAUSES Infections are caused by microbes, which are microscopic organisms, including fungi, viruses, and bacteria. These organisms are so small that they can only be seen through a microscope. Bacteria are the microbes that most commonly cause UTIs. SYMPTOMS  Symptoms of UTIs may vary by age and gender of the patient and by the location of the infection. Symptoms in young women typically include a frequent and intense urge to urinate and a painful, burning feeling in the bladder or urethra during urination. Older women and men are more likely to be tired, shaky, and weak and have muscle aches and abdominal pain. A fever may mean the infection is in your kidneys. Other symptoms of a kidney infection include pain in your back or sides below the ribs, nausea, and vomiting. DIAGNOSIS To diagnose a UTI, your caregiver will ask you about your symptoms. Your caregiver will also ask you to provide a urine sample. The urine sample will be tested for bacteria and white blood cells. White blood cells are made by your body to help fight infection. TREATMENT  Typically, UTIs can be treated with medication. Because most UTIs are caused by a bacterial infection, they usually can be treated with the use of antibiotics. The choice of antibiotic and length of treatment depend on your symptoms and the type of bacteria causing your infection. HOME CARE INSTRUCTIONS  If you were prescribed antibiotics, take them exactly as your caregiver instructs you. Finish the medication even if you feel better after  you have only taken some of the medication.  Drink enough water and fluids to keep your urine clear or pale yellow.  Avoid caffeine, tea, and carbonated beverages. They tend to irritate your bladder.  Empty your bladder often. Avoid holding urine for long periods of time.  Empty your bladder before and after sexual intercourse.  After a bowel movement, women should cleanse from front to back. Use each tissue only once. SEEK MEDICAL CARE IF:   You have back pain.  You develop a fever.  Your symptoms do not begin to resolve within 3 days. SEEK IMMEDIATE MEDICAL CARE IF:   You have severe back pain or lower abdominal pain.  You develop chills.  You have nausea or vomiting.  You have continued burning or discomfort with urination. MAKE SURE YOU:   Understand these instructions.  Will watch your condition.  Will get help right away if you are not doing well or get worse.   This information is not intended to replace advice given to you by your health care provider. Make sure you discuss any questions you have with your health care provider.   Document Released: 11/09/2004 Document Revised: 10/21/2014 Document Reviewed: 03/10/2011 Elsevier Interactive Patient Education 2016 [employer].  Confusion Confusion is the inability to think with your usual speed or clarity. Confusion may come on quickly or slowly over time. How quickly the confusion comes on depends on the cause. Confusion can be due to any number of causes. CAUSES   Concussion, head injury, or head trauma.  Seizures.  Stroke.  Fever.  Brain tumor.  Age related decreased brain function (dementia).  Heightened emotional states like rage or terror.  Mental illness in which the person loses the ability to determine what is real and what is not (hallucinations).  Infections such as a urinary tract infection (UTI).  Toxic effects from alcohol, drugs, or prescription medicines.  Dehydration and an  imbalance of salts in the body (electrolytes).  Lack of sleep.  Low blood sugar (diabetes).  Low levels of oxygen from conditions such as chronic lung disorders.  Drug interactions or other medicine side effects.  Nutritional deficiencies, especially niacin, thiamine, vitamin C, or vitamin B.  Sudden drop in body temperature (hypothermia).  Change in routine, such as when traveling or hospitalized. SIGNS AND SYMPTOMS  People often describe their thinking as cloudy or unclear when they are confused. Confusion can also include feeling disoriented. That means you are unaware of where or who you are. You may also not know what the date or time is. If confused, you may also have difficulty paying attention, remembering, and making decisions. Some people also act aggressively when they are confused.  DIAGNOSIS  The medical evaluation of confusion may include:  Blood and urine tests.  X-rays.  Brain and nervous system tests.  Analyzing your brain waves (electroencephalogram or EEG).  Magnetic resonance imaging (MRI) of your head.  Computed tomography (CT) scan of your head.  Mental status tests in which your health care provider may ask many questions. Some of these questions may seem silly or strange, but they are a very important test to help diagnose and treat confusion. TREATMENT  An admission to the hospital may not be needed, but a person with confusion should not be left alone. Stay with a family member or friend until the confusion clears. Avoid alcohol, pain relievers, or sedative drugs until you have fully recovered. Do not drive until directed by your health care provider. HOME CARE INSTRUCTIONS  What family and friends can do:  To find out if someone is confused, ask the person to state his or her name, age, and the date. If the person is unsure or answers incorrectly, he or she is confused.  Always introduce yourself, no matter how well the person knows you.  Often  remind the person of his or her location.  Place a calendar and clock near the confused person.  Help the person with his or her medicines. You may want to use a pill box, an alarm as a reminder, or give the person each dose as prescribed.  Talk about current events and plans for the day.  Try to keep the environment calm, quiet, and peaceful.  Make sure the person keeps follow-up visits with his or her health care provider. PREVENTION  Ways to prevent confusion:  Avoid alcohol.  Eat a balanced diet.  Get enough sleep.  Take medicine only as directed by your health care provider.  Do not become isolated. Spend time with other people and make plans for your days.  Keep careful watch on your blood sugar levels if you are diabetic. SEEK IMMEDIATE MEDICAL CARE IF:   You develop severe headaches, repeated vomiting, seizures, blackouts, or slurred speech.  There is increasing confusion, weakness, numbness, restlessness, or personality changes.  You develop a loss of balance, have marked dizziness, feel uncoordinated, or fall.  You have delusions, hallucinations, or develop severe anxiety.  Your family members think you need to be rechecked.   This information is not intended to replace advice given to you  by your health care provider. Make sure you discuss any questions you have with your health care provider.   Document Released: 03/09/2004 Document Revised: 02/20/2014 Document Reviewed: 03/07/2013 Elsevier Interactive Patient Education [employer].

## 2015-03-31 NOTE — ED Notes (Signed)
Checked a walking pulse ox and pt desated to 91 % at the lowest. Stayed around 93 % majority of time.

## 2015-04-01 LAB — URINE CULTURE

## 2015-06-04 ENCOUNTER — Emergency Department (HOSPITAL_COMMUNITY): Payer: Medicare Other

## 2015-06-04 ENCOUNTER — Observation Stay (HOSPITAL_COMMUNITY)
Admission: EM | Admit: 2015-06-04 | Discharge: 2015-06-05 | Disposition: A | Discharge status: Home / Self Care | Payer: Medicare Other | Attending: Internal Medicine | Admitting: Internal Medicine

## 2015-06-04 ENCOUNTER — Encounter (HOSPITAL_COMMUNITY): Payer: Self-pay | Admitting: Internal Medicine

## 2015-06-04 DIAGNOSIS — R55 Syncope and collapse: Secondary | ICD-10-CM | POA: Diagnosis present

## 2015-06-04 DIAGNOSIS — E119 Type 2 diabetes mellitus without complications: Secondary | ICD-10-CM | POA: Diagnosis not present

## 2015-06-04 DIAGNOSIS — Z79899 Other long term (current) drug therapy: Secondary | ICD-10-CM | POA: Insufficient documentation

## 2015-06-04 DIAGNOSIS — I951 Orthostatic hypotension: Principal | ICD-10-CM | POA: Diagnosis present

## 2015-06-04 DIAGNOSIS — W01198A Fall on same level from slipping, tripping and stumbling with subsequent striking against other object, initial encounter: Secondary | ICD-10-CM | POA: Diagnosis not present

## 2015-06-04 DIAGNOSIS — S069X9A Unspecified intracranial injury with loss of consciousness of unspecified duration, initial encounter: Secondary | ICD-10-CM | POA: Diagnosis present

## 2015-06-04 DIAGNOSIS — Z792 Long term (current) use of antibiotics: Secondary | ICD-10-CM | POA: Insufficient documentation

## 2015-06-04 DIAGNOSIS — E860 Dehydration: Secondary | ICD-10-CM | POA: Diagnosis present

## 2015-06-04 DIAGNOSIS — J4520 Mild intermittent asthma, uncomplicated: Secondary | ICD-10-CM

## 2015-06-04 DIAGNOSIS — E876 Hypokalemia: Secondary | ICD-10-CM | POA: Diagnosis present

## 2015-06-04 DIAGNOSIS — Z720 Tobacco use: Secondary | ICD-10-CM

## 2015-06-04 DIAGNOSIS — N12 Tubulo-interstitial nephritis, not specified as acute or chronic: Secondary | ICD-10-CM | POA: Diagnosis not present

## 2015-06-04 DIAGNOSIS — Y9389 Activity, other specified: Secondary | ICD-10-CM | POA: Insufficient documentation

## 2015-06-04 DIAGNOSIS — F329 Major depressive disorder, single episode, unspecified: Secondary | ICD-10-CM | POA: Diagnosis not present

## 2015-06-04 DIAGNOSIS — S060X9A Concussion with loss of consciousness of unspecified duration, initial encounter: Secondary | ICD-10-CM | POA: Insufficient documentation

## 2015-06-04 DIAGNOSIS — Y92009 Unspecified place in unspecified non-institutional (private) residence as the place of occurrence of the external cause: Secondary | ICD-10-CM | POA: Diagnosis not present

## 2015-06-04 DIAGNOSIS — Z880 Allergy status to penicillin: Secondary | ICD-10-CM | POA: Diagnosis not present

## 2015-06-04 DIAGNOSIS — J449 Chronic obstructive pulmonary disease, unspecified: Secondary | ICD-10-CM | POA: Diagnosis not present

## 2015-06-04 DIAGNOSIS — Z794 Long term (current) use of insulin: Secondary | ICD-10-CM | POA: Insufficient documentation

## 2015-06-04 DIAGNOSIS — I10 Essential (primary) hypertension: Secondary | ICD-10-CM | POA: Diagnosis not present

## 2015-06-04 DIAGNOSIS — N390 Urinary tract infection, site not specified: Secondary | ICD-10-CM

## 2015-06-04 DIAGNOSIS — F17210 Nicotine dependence, cigarettes, uncomplicated: Secondary | ICD-10-CM | POA: Diagnosis present

## 2015-06-04 DIAGNOSIS — J410 Simple chronic bronchitis: Secondary | ICD-10-CM

## 2015-06-04 DIAGNOSIS — J45909 Unspecified asthma, uncomplicated: Secondary | ICD-10-CM | POA: Diagnosis present

## 2015-06-04 DIAGNOSIS — R8281 Pyuria: Secondary | ICD-10-CM | POA: Diagnosis present

## 2015-06-04 DIAGNOSIS — F909 Attention-deficit hyperactivity disorder, unspecified type: Secondary | ICD-10-CM | POA: Diagnosis not present

## 2015-06-04 DIAGNOSIS — F419 Anxiety disorder, unspecified: Secondary | ICD-10-CM | POA: Diagnosis not present

## 2015-06-04 DIAGNOSIS — Y998 Other external cause status: Secondary | ICD-10-CM | POA: Diagnosis not present

## 2015-06-04 LAB — CBC WITH DIFFERENTIAL/PLATELET
BASOS ABS: 0.1 10*3/uL (ref 0.0–0.1)
Basophils Relative: 1 %
Eosinophils Absolute: 0.1 10*3/uL (ref 0.0–0.7)
Eosinophils Relative: 1 %
HEMATOCRIT: 47.5 % — AB (ref 36.0–46.0)
HEMOGLOBIN: 16 g/dL — AB (ref 12.0–15.0)
LYMPHS PCT: 36 %
Lymphs Abs: 3.7 10*3/uL (ref 0.7–4.0)
MCH: 29.7 pg (ref 26.0–34.0)
MCHC: 33.7 g/dL (ref 30.0–36.0)
MCV: 88.3 fL (ref 78.0–100.0)
MONO ABS: 0.6 10*3/uL (ref 0.1–1.0)
MONOS PCT: 6 %
NEUTROS ABS: 5.8 10*3/uL (ref 1.7–7.7)
Neutrophils Relative %: 56 %
Platelets: 207 10*3/uL (ref 150–400)
RBC: 5.38 MIL/uL — ABNORMAL HIGH (ref 3.87–5.11)
RDW: 13.1 % (ref 11.5–15.5)
WBC: 10.2 10*3/uL (ref 4.0–10.5)

## 2015-06-04 LAB — URINALYSIS, ROUTINE W REFLEX MICROSCOPIC
BILIRUBIN URINE: NEGATIVE
GLUCOSE, UA: NEGATIVE mg/dL
HGB URINE DIPSTICK: NEGATIVE
KETONES UR: NEGATIVE mg/dL
Nitrite: NEGATIVE
PH: 6 (ref 5.0–8.0)
PROTEIN: NEGATIVE mg/dL
Specific Gravity, Urine: 1.021 (ref 1.005–1.030)

## 2015-06-04 LAB — COMPREHENSIVE METABOLIC PANEL
ALK PHOS: 68 U/L (ref 38–126)
ALT: 14 U/L (ref 14–54)
AST: 18 U/L (ref 15–41)
Albumin: 3.5 g/dL (ref 3.5–5.0)
Anion gap: 13 (ref 5–15)
BILIRUBIN TOTAL: 0.7 mg/dL (ref 0.3–1.2)
BUN: 8 mg/dL (ref 6–20)
CALCIUM: 9.3 mg/dL (ref 8.9–10.3)
CO2: 27 mmol/L (ref 22–32)
CREATININE: 0.79 mg/dL (ref 0.44–1.00)
Chloride: 100 mmol/L — ABNORMAL LOW (ref 101–111)
GFR calc Af Amer: >60 mL/min (ref >60)
Glucose, Bld: 126 mg/dL — ABNORMAL HIGH (ref 65–99)
POTASSIUM: 3.3 mmol/L — AB (ref 3.5–5.1)
Sodium: 140 mmol/L (ref 135–145)
TOTAL PROTEIN: 7.4 g/dL (ref 6.5–8.1)

## 2015-06-04 LAB — PHOSPHORUS: Phosphorus: 3.1 mg/dL (ref 2.5–4.6)

## 2015-06-04 LAB — URINE MICROSCOPIC-ADD ON: RBC / HPF: NONE SEEN RBC/hpf (ref 0–5)

## 2015-06-04 LAB — I-STAT CG4 LACTIC ACID, ED: LACTIC ACID, VENOUS: 1.89 mmol/L (ref 0.5–2.0)

## 2015-06-04 LAB — TROPONIN I: TROPONIN I: 0.04 ng/mL — AB (ref <0.031)

## 2015-06-04 LAB — MAGNESIUM: Magnesium: 1.5 mg/dL — ABNORMAL LOW (ref 1.7–2.4)

## 2015-06-04 LAB — GLUCOSE, CAPILLARY: Glucose-Capillary: 201 mg/dL — ABNORMAL HIGH (ref 65–99)

## 2015-06-04 MED ORDER — HYDROCODONE-ACETAMINOPHEN 10-325 MG PO TABS
1.0000 | ORAL_TABLET | Freq: Four times a day (QID) | ORAL | Status: DC | PRN
  Administered 2015-06-04 – 2015-06-05 (×3): 1 via ORAL
  Filled 2015-06-04 (×3): qty 1

## 2015-06-04 MED ORDER — SODIUM CHLORIDE 0.9 % IV SOLN
INTRAVENOUS | Status: AC
  Administered 2015-06-04: via INTRAVENOUS

## 2015-06-04 MED ORDER — SODIUM CHLORIDE 0.9 % IV BOLUS (SEPSIS)
1000.0000 mL | Freq: Once | INTRAVENOUS | Status: AC
  Administered 2015-06-04: 1000 mL via INTRAVENOUS

## 2015-06-04 MED ORDER — MORPHINE SULFATE (PF) 4 MG/ML IV SOLN
4.0000 mg | Freq: Once | INTRAVENOUS | Status: AC
  Administered 2015-06-04: 4 mg via INTRAVENOUS
  Filled 2015-06-04: qty 1

## 2015-06-04 MED ORDER — GUAIFENESIN ER 600 MG PO TB12
600.0000 mg | ORAL_TABLET | Freq: Two times a day (BID) | ORAL | Status: DC
  Administered 2015-06-04 – 2015-06-05 (×2): 600 mg via ORAL
  Filled 2015-06-04 (×2): qty 1

## 2015-06-04 MED ORDER — ENOXAPARIN SODIUM 40 MG/0.4ML ~~LOC~~ SOLN: 40.0000 mg | SUBCUTANEOUS | Status: DC

## 2015-06-04 MED ORDER — IPRATROPIUM BROMIDE 0.02 % IN SOLN
0.5000 mg | Freq: Four times a day (QID) | RESPIRATORY_TRACT | Status: DC
  Administered 2015-06-04: 0.5 mg via RESPIRATORY_TRACT
  Filled 2015-06-04: qty 2.5

## 2015-06-04 MED ORDER — SODIUM CHLORIDE 0.9% FLUSH
3.0000 mL | Freq: Two times a day (BID) | INTRAVENOUS | Status: DC
  Administered 2015-06-04: 3 mL via INTRAVENOUS

## 2015-06-04 MED ORDER — ALBUTEROL SULFATE (2.5 MG/3ML) 0.083% IN NEBU
2.5000 mg | INHALATION_SOLUTION | RESPIRATORY_TRACT | Status: DC | PRN
  Administered 2015-06-04: 2.5 mg via RESPIRATORY_TRACT
  Filled 2015-06-04: qty 3

## 2015-06-04 MED ORDER — DEXTROSE 5 % IV SOLN
1.0000 g | INTRAVENOUS | Status: DC
  Filled 2015-06-04: qty 10

## 2015-06-04 MED ORDER — GLIPIZIDE 10 MG PO TABS
10.0000 mg | ORAL_TABLET | Freq: Every day | ORAL | Status: DC
  Administered 2015-06-05: 10 mg via ORAL
  Filled 2015-06-04: qty 1

## 2015-06-04 MED ORDER — BREXPIPRAZOLE 2 MG PO TABS
4.0000 mg | ORAL_TABLET | Freq: Every morning | ORAL | Status: DC
  Filled 2015-06-04: qty 2

## 2015-06-04 MED ORDER — HYDRALAZINE HCL 20 MG/ML IJ SOLN
10.0000 mg | INTRAMUSCULAR | Status: DC | PRN
  Administered 2015-06-04: 10 mg via INTRAVENOUS
  Filled 2015-06-04: qty 1

## 2015-06-04 MED ORDER — INSULIN ASPART 100 UNIT/ML ~~LOC~~ SOLN: 0.0000 [IU] | Freq: Three times a day (TID) | SUBCUTANEOUS | Status: DC

## 2015-06-04 MED ORDER — ALPRAZOLAM 0.5 MG PO TABS
1.0000 mg | ORAL_TABLET | Freq: Three times a day (TID) | ORAL | Status: DC
  Administered 2015-06-04 – 2015-06-05 (×2): 1 mg via ORAL
  Filled 2015-06-04 (×3): qty 2

## 2015-06-04 MED ORDER — VENLAFAXINE HCL ER 150 MG PO CP24
150.0000 mg | ORAL_CAPSULE | Freq: Every morning | ORAL | Status: DC
Start: 2015-06-05 — End: 2015-06-05
  Administered 2015-06-05: 150 mg via ORAL
  Filled 2015-06-04: qty 1

## 2015-06-04 MED ORDER — NORTRIPTYLINE HCL 25 MG PO CAPS
75.0000 mg | ORAL_CAPSULE | Freq: Every day | ORAL | Status: DC
  Administered 2015-06-04: 75 mg via ORAL
  Filled 2015-06-04: qty 3

## 2015-06-04 MED ORDER — NICOTINE 21 MG/24HR TD PT24
21.0000 mg | MEDICATED_PATCH | Freq: Every day | TRANSDERMAL | Status: DC
  Administered 2015-06-05: 21 mg via TRANSDERMAL
  Filled 2015-06-04: qty 1

## 2015-06-04 MED ORDER — DEXTROSE 5 % IV SOLN
1.0000 g | Freq: Once | INTRAVENOUS | Status: AC
  Administered 2015-06-04: 1 g via INTRAVENOUS
  Filled 2015-06-04: qty 10

## 2015-06-04 MED ORDER — INSULIN ASPART 100 UNIT/ML ~~LOC~~ SOLN
0.0000 [IU] | Freq: Every day | SUBCUTANEOUS | Status: DC
  Administered 2015-06-04: 2 [IU] via SUBCUTANEOUS

## 2015-06-04 NOTE — ED Notes (Signed)
To ED via [facility] from home with c/o "passing out 4 times in 2 days when standing up" pt did not want to come, family had to convince pt. Pt was orthostatic and dizzy with orthostatic vitals. Pt is alert/ oriented x 4, w/d, pink. States "I feel weak"

## 2015-06-04 NOTE — H&P (Signed)
[name] [id number] DOB: 1955-08-09 DOA: 06/04/2015   Referring PA: Gertie Fey PCP: Barbette Merino, MD   Outpatient Specialists: Cardiology [facility], [facility] Patient coming from: Home  Chief Complaint: Recurrent syncopes reports passing out and hitting her head  HPI: [name] is a 60 y.o. female with medical history significant of depression, hypertension, diabetes and hyperlipidemia    Presented with 2 day history of feeling lightheaded episodes of syncope 4 times in the past 48 hours patient feels lightheaded when she is standing up from sitting position. Had an episode today where she passed out after standing up falling backwards and hitting the back of her head was unconscious for at least 1 minute this was witnessed. Patient feels weak. Denies any chest pain or shortness of breath or nausea no vomiting no diarrhea, no headache no dysuria although she has been very thirsty. No melena no block in stools. She does endorse mild nonproductive cough lasting for the past week otherwise unremarkable. [place]he has history of depression and her Effexor has been increased 2 months ago because her depression has gotten worse. She has not been eating well only drinks 1 Spite a day. She ha been loosing weight ever since her mother died. She has had severe episodes of depression a per family.  Over the past couple months patient have had recurrent episodes of syncope does not any prodrome no seizure activity or postictal state.   Regarding pertinent Chronic problems: Patient has history of diabetes reports poor compliance only takes metformin and glipizide but not her insulin. History of hypertension patient states she takes something for that but doesn't remember what this her blood pressure usually runs high in 160s 170 range. She states that she hasn't been compliant with medications and so her primary care did not adjusted dose. Recommended her taking her medications which she  hasn't been.  She has history of COPD disorder with tobacco abuse has been having intermittent episodes of shortness of breath and cough.. She has been seen by cardiologist and some chest pain 2015 at that time echogram was done and showed ef of 65%  has history of depression recently had to increase her Effexor secondary to increase of depressive symptoms.   IN ER: Patient was noted to be orthostatic with blood pressure going down the 60s when she stood up UA showed 6-30 white blood cell count many bacteria and moderate leukocytosis potassium 3.3 hemoglobin 16,  lactic acid 1.89 Chest x-ray showing no acute abnormality. CT scan of the head unremarkable hip images showing no fracture subluxation.   Hospitalist was called for admission for orthostatic hypotension and syncope  Review of Systems:    Pertinent positives include: Lightheadedness syncope and non-productive cough,   Constitutional:  No weight loss, night sweats, Fevers, chills, fatigue, weight loss  HEENT:  No headaches, Difficulty swallowing,Tooth/dental problems,Sore throat,  No sneezing, itching, ear ache, nasal congestion, post nasal drip,  Cardio-vascular:  No chest pain, Orthopnea, PND, anasarca, dizziness, palpitations.no Bilateral lower extremity swelling  GI:  No heartburn, indigestion, abdominal pain, nausea, vomiting, diarrhea, change in bowel habits, loss of appetite, melena, blood in stool, hematemesis Resp:  no shortness of breath at rest. No dyspnea on exertion, No excess mucus, no productive cough, No No coughing up of blood.No change in color of mucus.No wheezing. Skin:  no rash or lesions. No jaundice GU:  no dysuria, change in color of urine, no urgency or frequency. No straining to urinate.  No flank pain.  Musculoskeletal:  No joint pain or no joint swelling. No decreased range of motion. No back pain.  Psych:  No change in mood or affect. No depression or anxiety. No memory loss.  Neuro: no  localizing neurological complaints, no tingling, no weakness, no double vision, no gait abnormality, no slurred speech, no confusion  As per HPI otherwise 10 point review of systems negative.   Past Medical History: Past Medical History  Diagnosis Date  . Depression   . Arthritis   . 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 ADD (attention deficit disorder with hyperactivity)   . Hypertension   . COPD (chronic obstructive pulmonary disease) ([place])   . Asthma   . GERD (gastroesophageal reflux disease)   . Anxiety   . Bronchitis   . Gestational diabetes    Past Surgical History  Procedure Laterality Date  . Abdominal hysterectomy    . Spine surgery    . Vulvar excision  2006    Cancer in situ  . Oophorectomy  2006    cervical cancer  . Hernia repair      incisional/ventral hernia repair  . Rectovaginal fistula closure  2012  . Colectomy       Social History:  Ambulatory     independently   Lives at home   With family     reports that she has been smoking Cigarettes.  She has been smoking about 1.00 pack per day. She has never used smokeless tobacco. She reports that she does not drink alcohol or use illicit drugs.  Allergies:   Allergies  Allergen Reactions  . Contrast Media [Iodinated Diagnostic Agents] Nausea And Vomiting  . Doxycycline Nausea And Vomiting  . Erythromycin Nausea And Vomiting  . Penicillins Itching and Rash    All over the body.  . Metronidazole Hives and Rash       Family History:    Family History  Problem Relation Age of Onset  . COPD Father   . Cancer Father     lung  . Cancer Paternal Aunt     lung    Medications: Prior to Admission medications   Medication Sig Start Date End Date Taking? Authorizing Provider  albuterol (PROVENTIL HFA;VENTOLIN HFA) 108 (90 BASE) MCG/ACT inhaler Inhale 2 puffs into the lungs 2 (two) times daily as needed for wheezing.   Yes Historical Provider, MD  albuterol (PROVENTIL) (2.5 MG/3ML) 0.083% nebulizer  solution Take 3 mLs (2.5 mg total) by nebulization every 4 (four) hours as needed for wheezing or shortness of breath. 01/05/14  Yes Theodis Blaze, MD  alprazolam Duanne Moron) 2 MG tablet Take 0.5 tablets (1 mg total) by mouth at bedtime as needed for sleep or anxiety (Can take up to 3 thimes daily prn). Patient takes 1/2 tablet by mouth four times a day Patient taking differently: Take 1-2 mg by mouth 4 (four) times daily - after meals and at bedtime. Patient takes 1/2 tab three times daily and 1 tab at bedtime 01/05/14  Yes Theodis Blaze, MD  glipiZIDE (GLUCOTROL) 10 MG tablet Take 10 mg by mouth daily before breakfast.   Yes Historical Provider, MD  HYDROcodone-acetaminophen (NORCO) 10-325 MG tablet Take 1 tablet by mouth every 6 (six) hours as needed for moderate pain.  03/05/15  Yes Historical Provider, MD  LANTUS SOLOSTAR 100 UNIT/ML Solostar Pen Inject 14 Units into the skin daily as needed (for hyperglycemic events, not regularly).  01/27/15  Yes Historical Provider, MD  metFORMIN (GLUCOPHAGE) 500 MG tablet Take 500  mg by mouth 2 (two) times daily with a meal.   Yes Historical Provider, MD  nortriptyline (PAMELOR) 25 MG capsule Take 75 mg by mouth at bedtime. 03/10/15  Yes Historical Provider, MD  REXULTI 4 MG TABS Take 4 mg by mouth every morning. 03/11/15  Yes Historical Provider, MD  SPIRIVA HANDIHALER 18 MCG inhalation capsule Place 18 mcg into inhaler and inhale daily.  01/27/15  Yes Historical Provider, MD  venlafaxine XR (EFFEXOR-XR) 150 MG 24 hr capsule Take 150 mg by mouth every morning. 05/13/15  Yes Historical Provider, MD  Vitamin D, Ergocalciferol, (DRISDOL) 50000 UNITS CAPS Take 50,000 Units by mouth every 7 (seven) days. On thursday   Yes Historical Provider, MD  cephALEXin (KEFLEX) 500 MG capsule Take 1 capsule (500 mg total) by mouth 4 (four) times daily. 03/31/15   Delice Bison Ward, DO    Physical Exam: Patient Vitals for the past 24 hrs:  BP Temp Temp src Pulse Resp SpO2 Height Weight   06/04/15 2001 (!) 175/110 mmHg - - 84 16 96 % - -  06/04/15 1945 (!) 207/123 mmHg - - 83 23 92 % - -  06/04/15 1737 158/86 mmHg - - 84 18 97 % - -  06/04/15 1700 (!) 189/103 mmHg - - 84 22 97 % - -  06/04/15 1545 154/88 mmHg - - 78 13 93 % - -  06/04/15 1530 159/99 mmHg - - 79 16 94 % - -  06/04/15 1515 142/83 mmHg - - 77 19 92 % - -  06/04/15 1430 117/87 mmHg - - 85 15 93 % - -  06/04/15 1412 128/78 mmHg 98.5 F (36.9 C) Oral 88 16 95 % 5\' 5"  (1.651 m) 68.947 kg (152 lb)    1. General:  in No Acute distress 2. Psychological: Alert and   Oriented 3. Head/ENT:     Dry Mucous Membranes                          Head Non traumatic, neck supple                          Normal  Dentition 4. SKIN: decreased Skin turgor,  Skin clean Dry and intact no rash 5. Heart: Regular rate and rhythm  no Murmur, Rub or gallop 6. Lungs:  no wheezes or crackles coarse breathsounds 7. Abdomen: Soft, non-tender, Non distended 8. Lower extremities: no clubbing, cyanosis, or edema 9. Neurologically: CN 2-12 intact, normal strength.   10. MSK: Normal range of motion costovertebral tenderness  body mass index is 25.29 kg/(m^2).  Labs on Admission:   Labs on Admission: I have personally reviewed following labs and imaging studies  CBC:  Recent Labs Lab 06/04/15 1450  WBC 10.2  NEUTROABS 5.8  HGB 16.0*  HCT 47.5*  MCV 88.3  PLT [id number]   Basic Metabolic Panel:  Recent Labs Lab 06/04/15 1450  NA 140  K 3.3*  CL 100*  CO2 27  GLUCOSE 126*  BUN 8  CREATININE 0.79  CALCIUM 9.3   GFR: Estimated Creatinine Clearance: 72 mL/min (by C-G formula based on Cr of 0.79). Liver Function Tests:  Recent Labs Lab 06/04/15 1450  AST 18  ALT 14  ALKPHOS 68  BILITOT 0.7  PROT 7.4  ALBUMIN 3.5   No results for input(s): LIPASE, AMYLASE in the last 168 hours. No results for input(s): AMMONIA in the last 168 hours.  Coagulation Profile: No results for input(s): INR, PROTIME in the last 168  hours. Cardiac Enzymes: No results for input(s): CKTOTAL, CKMB, CKMBINDEX, TROPONINI in the last 168 hours. BNP (last 3 results) No results for input(s): PROBNP in the last 8760 hours. HbA1C: No results for input(s): HGBA1C in the last 72 hours. CBG: No results for input(s): GLUCAP in the last 168 hours. Lipid Profile: No results for input(s): CHOL, HDL, LDLCALC, TRIG, CHOLHDL, LDLDIRECT in the last 72 hours. Thyroid Function Tests: No results for input(s): TSH, T4TOTAL, FREET4, T3FREE, THYROIDAB in the last 72 hours. Anemia Panel: No results for input(s): VITAMINB12, FOLATE, FERRITIN, TIBC, IRON, RETICCTPCT in the last 72 hours. Urine analysis:    Component Value Date/Time   COLORURINE AMBER* 06/04/2015 1723   APPEARANCEUR CLOUDY* 06/04/2015 1723   LABSPEC 1.021 06/04/2015 1723   PHURINE 6.0 06/04/2015 1723   GLUCOSEU NEGATIVE 06/04/2015 1723   HGBUR NEGATIVE 06/04/2015 1723   BILIRUBINUR NEGATIVE 06/04/2015 1723   KETONESUR NEGATIVE 06/04/2015 1723   PROTEINUR NEGATIVE 06/04/2015 1723   UROBILINOGEN 0.2 01/03/2014 1819   NITRITE NEGATIVE 06/04/2015 1723   LEUKOCYTESUR MODERATE* 06/04/2015 1723   Sepsis Labs: @LABRCNTIP (procalcitonin:4,lacticidven:4) )No results found for this or any previous visit (from the past 240 hour(s)).     UA increased white blood cell count and many bacteria urine  Lab Results  Component Value Date   HGBA1C 11.2* 01/04/2014    Estimated Creatinine Clearance: 72 mL/min (by C-G formula based on Cr of 0.79).  BNP (last 3 results) No results for input(s): PROBNP in the last 8760 hours.   ECG REPORT  Independently reviewed Rate:101  Rhythm: Sinus tachycardia left hypertrophy frequent PVCs ST&T Change: No acute ischemic changes  QTC 473  Filed Weights   06/04/15 1412  Weight: 68.947 kg (152 lb)     Cultures:    Component Value Date/Time   SDES URINE, RANDOM 03/30/2015 2312   [place] ADDED 03/30/15 AT 2352 03/30/2015 2312   CULT  MULTIPLE SPECIES PRESENT, SUGGEST RECOLLECTION 03/30/2015 2312   REPTSTATUS 04/01/2015 FINAL 03/30/2015 2312     Radiological Exams on Admission: Dg Chest 2 View  06/04/2015  CLINICAL DATA:  Cough for 2 days EXAM: CHEST  2 VIEW COMPARISON:  03/30/2015 FINDINGS: The heart size and mediastinal contours are within normal limits. Both lungs are clear. The visualized skeletal structures are unremarkable. IMPRESSION: No acute abnormality noted. Electronically Signed   By: Inez Catalina M.D.   On: 06/04/2015 16:10   Ct Head Wo Contrast  06/04/2015  CLINICAL DATA:  Fall. Syncope. Repeated loss of consciousness. Diabetes and hypertension. EXAM: CT HEAD WITHOUT CONTRAST TECHNIQUE: Contiguous axial images were obtained from the base of the skull through the vertex without intravenous contrast. COMPARISON:  03/30/2015. FINDINGS: Sinuses/Soft tissues: No significant soft tissue swelling. Clear paranasal sinuses and mastoid air cells. Cerumen in the external ear canals bilaterally. Intracranial: No mass lesion, hemorrhage, hydrocephalus, acute infarct, intra-axial, or extra-axial fluid collection. IMPRESSION: Normal head CT. Electronically Signed   By: Abigail Miyamoto M.D.   On: 06/04/2015 16:43   Dg Hip Unilat With Pelvis 2-3 Views Left  06/04/2015  CLINICAL DATA:  Left hip pain after multiple falls. Patient reports syncope x4 in the past 2 days. EXAM: DG HIP (WITH OR WITHOUT PELVIS) 2-3V LEFT COMPARISON:  None. FINDINGS: The cortical margins of the bony pelvis and left hip are intact. No fracture. Pubic symphysis and sacroiliac joints are congruent. Both femoral heads are well-seated in the respective acetabula. IMPRESSION:  No fracture or subluxation of the pelvis or left hip. Electronically Signed   By: Jeb Levering M.D.   On: 06/04/2015 18:37    Chart has been reviewed    Assessment/Plan 60 year old female history of significant depression hypertension complicated by noncompliance, diabetes also  noncompliance presents with orthostatic hypotension resulting in syncope and head injury likely secondary to dehydration with evidence of hemoconcentration possible UTI  Present on Admission:  . Syncope and collapse - most likely secondary to orthostatic hypotension, cycle cardiac enzymes monitor on telemetry for any cardiac dysrhythmia, obtain echogram and carotid Dopplers, CT scan of the head unremarkable, neurologically: Intact, will rehydrate and recheck orthostatics. Patient's family states that she has not been as mobile as she should be prior to discharge we'll make sure patient is safe from PT OT standpoint  . Asthma/. COPD (chronic obstructive pulmonary disease) ([facility]) - chronic currently appears to be stable continue a pedal when necessary and order Atrovent strongly encouraged patient to discontinue smoking  . Accelerated hypertension- likely secondary to noncompliance, recent episode of hypotension in the setting of orthostatic hypotension resulting in syncope. Will need to find out what medication patient takes at home recommended patient compliance. For now when necessary hydralazine some permissive hypertension the setting of symptomatic hypotension  . Hypokalemia -will replace check magnesium and phosphate level due to poor by mouth intake  . Orthostatic hypotension secondary to dehydration poor by mouth intake will rehydrate spoke about importance of good diet check prealbumin is needed nutritional consult  . Head injury with loss of consciousness ([place]) . Dehydration IV fluids    . Pyuria - given costovertebral tenderness will treat for another antibiotics but will await results of urine cultures  . Tobacco abuse - discussed importance of quitting, nursing to provide tobacco medication, nicotine patch ordered,  Other plan as per orders.  DVT prophylaxis:   Lovenox     Code Status:  FULL CODE  as per patient   Family Communication:   Family   at  Bedside  plan of care was  discussed with  [facility] daughter Carmela Rima [phone]  Disposition Plan:    To home once workup is complete and patient is stable   Consults called: none    Admission status:  obs    Level of care    tele       I have spent a total of 56 min on this admission    Keenan Trefry 06/04/2015, 9:21 PM    [facility]  Pager [phone]   after 2 AM please page floor coverage PA If 7AM-7PM, please contact the day team taking care of the patient  [email or web address]  Password [email or web address]

## 2015-06-04 NOTE — ED Notes (Signed)
Family at bedside. 

## 2015-06-04 NOTE — ED Provider Notes (Signed)
CSN: [id number]     Arrival date & time 06/04/15  1406 History   First MD Initiated Contact with Patient 06/04/15 1502     No chief complaint on file.    (Consider location/radiation/quality/duration/timing/severity/associated sxs/prior Treatment) HPI   60 year old female with history of insulin-dependent diabetes, hypertension, COPD, asthma brought here via EMS from home evaluation of syncope.  History obtained through patient and family member who is at bedside. For the past 1-2 months patient has had 3-4 separate syncopal episode in which she would fall down to the ground without any warning and passed out. Each episode lasting for less than a minute without any post ictal state or seizure activity. Today she was going from a sitting position to a standing position at the porch next to her family member when she fell backward striking her head against paneling of the house and passed out for approximately less than 1 minute. EMS was called and patient was brought here for further evaluation. Patient denies any post ictal episode or any confusion. Aside from mild occipital head tenderness she denies having any neck pain, severe headache, URI symptoms, chest pain, shortness of breath, abdominal pain, back pain, focal numbness or weakness, rash, dysuria, bowel bladder problem, nausea vomiting diarrhea, black tarry stools. She does admits to occasional nonproductive cough for the past week without other URI symptoms. Aside from a recent change in her pressure medication, Effexor 2 to increase depression, she denies any other changes. She does admits to having a history of diabetes and is insulin-dependent but have not been compliant with her diabetic medication especially insulin. She does take her metformin. She mentioned early in the month her sugar was in the 500s but it has since been regulated to "perfect level". She denies any self-medication either with alcohol or street drug use. She did have a  negative cervical fusion 3 years ago and since then has had residual left upper extremities weakness which has not changed. She also denies any precipitating symptoms prior to the fall.     Past Medical History  Diagnosis Date  . Depression   . Arthritis   . 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 ADD (attention deficit disorder with hyperactivity)   . Hypertension   . COPD (chronic obstructive pulmonary disease) ([place])   . Asthma   . GERD (gastroesophageal reflux disease)   . Anxiety   . Bronchitis   . Gestational diabetes    Past Surgical History  Procedure Laterality Date  . Abdominal hysterectomy    . Spine surgery    . Vulvar excision  2006    Cancer in situ  . Oophorectomy  2006    cervical cancer  . Hernia repair      incisional/ventral hernia repair  . Rectovaginal fistula closure  2012  . Colectomy     Family History  Problem Relation Age of Onset  . COPD Father   . Cancer Father     lung  . Cancer Paternal Aunt     lung   Social History  Substance Use Topics  . Smoking status: Current Every Day Smoker -- 1.00 packs/day    Types: Cigarettes  . Smokeless tobacco: Never Used  . Alcohol Use: No   OB History    No data available     Review of Systems  All other systems reviewed and are negative.     Allergies  Penicillins; Contrast media; Doxycycline; Erythromycin; and Metronidazole  Home Medications   Prior to Admission medications  Medication Sig Start Date End Date Taking? Authorizing Provider  albuterol (PROVENTIL HFA;VENTOLIN HFA) 108 (90 BASE) MCG/ACT inhaler Inhale 2 puffs into the lungs 2 (two) times daily as needed for wheezing.    Historical Provider, MD  albuterol (PROVENTIL) (2.5 MG/3ML) 0.083% nebulizer solution Take 3 mLs (2.5 mg total) by nebulization every 4 (four) hours as needed for wheezing or shortness of breath. 01/05/14   Theodis Blaze, MD  alprazolam Duanne Moron) 2 MG tablet Take 0.5 tablets (1 mg total) by mouth at bedtime as needed for  sleep or anxiety (Can take up to 3 thimes daily prn). Patient takes 1/2 tablet by mouth four times a day 01/05/14   Theodis Blaze, MD  cephALEXin (KEFLEX) 500 MG capsule Take 1 capsule (500 mg total) by mouth 4 (four) times daily. 03/31/15   Kristen N Ward, DO  flurazepam (DALMANE) 30 MG capsule Take 30 mg by mouth daily. 03/29/15   Historical Provider, MD  glipiZIDE (GLUCOTROL) 10 MG tablet Take 10 mg by mouth daily before breakfast.    Historical Provider, MD  HYDROcodone-acetaminophen (NORCO) 10-325 MG tablet Take 1 tablet by mouth every 6 (six) hours as needed for moderate pain.  03/05/15   Historical Provider, MD  LANTUS SOLOSTAR 100 UNIT/ML Solostar Pen Inject 14 Units into the skin daily. 01/27/15   Historical Provider, MD  metFORMIN (GLUCOPHAGE) 500 MG tablet Take 500 mg by mouth 2 (two) times daily with a meal.    Historical Provider, MD  nortriptyline (PAMELOR) 25 MG capsule Take 75 mg by mouth at bedtime. 03/10/15   Historical Provider, MD  REXULTI 4 MG TABS Take 4 mg by mouth every morning. 03/11/15   Historical Provider, MD  SPIRIVA HANDIHALER 18 MCG inhalation capsule Place 18 mcg into inhaler and inhale daily.  01/27/15   Historical Provider, MD  venlafaxine XR (EFFEXOR-XR) 75 MG 24 hr capsule Take 75 mg by mouth daily. 03/29/15   Historical Provider, MD  Vitamin D, Ergocalciferol, (DRISDOL) 50000 UNITS CAPS Take 50,000 Units by mouth every 7 (seven) days. On thursday    Historical Provider, MD   BP 117/87 mmHg  Pulse 85  Temp(Src) 98.5 F (36.9 C) (Oral)  Resp 15  Ht 5\' 5"  (1.651 m)  Wt 68.947 kg  BMI 25.29 kg/m2  SpO2 93% Physical Exam  Constitutional: She appears well-developed and well-nourished. No distress.  Caucasian female laying in bed in no acute discomfort. Patient has flat affect.  HENT:  Head: Atraumatic.  No hemotympanum, no septal hematoma, no malocclusion, no midface tenderness, no significant scalp tenderness.  Mouth is dry.  Eyes: Conjunctivae and EOM are  normal. Pupils are equal, round, and reactive to light.  Neck: Normal range of motion. Neck supple.  No nuchal rigidity. No significant cervical midline spine tenderness  Cardiovascular: Normal rate and regular rhythm.  Exam reveals no gallop and no friction rub.   No murmur heard. Pulmonary/Chest:  Scattered rhonchi on lung examination clear with cough.  Abdominal: Soft. There is no tenderness.  Musculoskeletal:  L CVA tenderness and tenderness to L lower back  Neurological: She is alert. No cranial nerve deficit or sensory deficit. Coordination normal. GCS eye subscore is 4. GCS verbal subscore is 5. GCS motor subscore is 6.  4+ strength to left upper extremity, 5+ strength to right upper extremity. 5/5 strengths bilateral lower extremities  Skin: No rash noted.  Psychiatric: She has a normal mood and affect.  Nursing note and vitals reviewed.   ED Course  Procedures (including critical care time) Labs Review Labs Reviewed  CBC WITH DIFFERENTIAL/PLATELET - Abnormal; Notable for the following:    RBC 5.38 (*)    Hemoglobin 16.0 (*)    HCT 47.5 (*)    All other components within normal limits  COMPREHENSIVE METABOLIC PANEL - Abnormal; Notable for the following:    Potassium 3.3 (*)    Chloride 100 (*)    Glucose, Bld 126 (*)    All other components within normal limits  URINALYSIS, ROUTINE W REFLEX MICROSCOPIC (NOT AT [facility]) - Abnormal; Notable for the following:    Color, Urine AMBER (*)    APPearance CLOUDY (*)    Leukocytes, UA MODERATE (*)    All other components within normal limits  URINE MICROSCOPIC-ADD ON - Abnormal; Notable for the following:    Squamous Epithelial / LPF 6-30 (*)    Bacteria, UA MANY (*)    Casts HYALINE CASTS (*)    All other components within normal limits  URINE CULTURE  I-STAT CG4 LACTIC ACID, ED  POC OCCULT BLOOD, ED    Imaging Review Dg Chest 2 View  06/04/2015  CLINICAL DATA:  Cough for 2 days EXAM: CHEST  2 VIEW COMPARISON:  03/30/2015  FINDINGS: The heart size and mediastinal contours are within normal limits. Both lungs are clear. The visualized skeletal structures are unremarkable. IMPRESSION: No acute abnormality noted. Electronically Signed   By: Inez Catalina M.D.   On: 06/04/2015 16:10   Ct Head Wo Contrast  06/04/2015  CLINICAL DATA:  Fall. Syncope. Repeated loss of consciousness. Diabetes and hypertension. EXAM: CT HEAD WITHOUT CONTRAST TECHNIQUE: Contiguous axial images were obtained from the base of the skull through the vertex without intravenous contrast. COMPARISON:  03/30/2015. FINDINGS: Sinuses/Soft tissues: No significant soft tissue swelling. Clear paranasal sinuses and mastoid air cells. Cerumen in the external ear canals bilaterally. Intracranial: No mass lesion, hemorrhage, hydrocephalus, acute infarct, intra-axial, or extra-axial fluid collection. IMPRESSION: Normal head CT. Electronically Signed   By: Abigail Miyamoto M.D.   On: 06/04/2015 16:43   Dg Hip Unilat With Pelvis 2-3 Views Left  06/04/2015  CLINICAL DATA:  Left hip pain after multiple falls. Patient reports syncope x4 in the past 2 days. EXAM: DG HIP (WITH OR WITHOUT PELVIS) 2-3V LEFT COMPARISON:  None. FINDINGS: The cortical margins of the bony pelvis and left hip are intact. No fracture. Pubic symphysis and sacroiliac joints are congruent. Both femoral heads are well-seated in the respective acetabula. IMPRESSION: No fracture or subluxation of the pelvis or left hip. Electronically Signed   By: Jeb Levering M.D.   On: 06/04/2015 18:37   I have personally reviewed and evaluated these images and lab results as part of my medical decision-making.   EKG Interpretation   Date/Time:  Friday June 04 2015 14:32:39 EDT Ventricular Rate:  88 PR Interval:  138 QRS Duration: 106 QT Interval:  380 QTC Calculation: 460 R Axis:   66 Text Interpretation:  Sinus rhythm No significant change since last  tracing Confirmed by FLOYD MD, Quillian Quince [id number]) on 06/04/2015  2:45:06 PM      MDM   Final diagnoses:  Orthostatic hypotension  Head injury with loss of consciousness (HCC)  Dehydration  Pyelonephritis    BP 175/110 mmHg  Pulse 84  Temp(Src) 98.5 F (36.9 C) (Oral)  Resp 16  Ht 5\' 5"  (1.651 m)  Wt 68.947 kg  BMI 25.29 kg/m2  SpO2 96%   3:54 PM Patient here for evaluation of  syncope likely orthostatic in origin. Workup initiated.  SHe does not c/o dysuria but does report some low back pain.  No midline spine tenderness on exam.   7:59 PM Orthostatic Lying  - BP- Lying: 123/78 mmHg ; Pulse- Lying: 85  Orthostatic Sitting - BP- Sitting: 123/87 mmHg ; Pulse- Sitting: 98  Orthostatic Standing at 0 minutes - BP- Standing at 0 minutes: 69/46 mmHg ; Pulse- Standing at 0 minutes: 111  Patient has a positive orthostatic vital sign. Blood is hemoconcentrated suggestive of dehydration. Urine with questionable urinary tract infection with evidence of moderate leukocytes and 6-30 WBC and many bacteria. She may have pyelonephritis therefore Rocephin was given and urine culture obtained. IV hydration in the ER. Hip and chest x-ray shows no acute finding and head CT scan is normal.  Care discussed with Dr. Lita Mains.  Plan to have patient admitted for further management.  8:19 PM Appreciate consultation from [facility] Dr. Roel Cluck who agrees to see pt in the ER and will admits to obs, telemetry for further management of her condition.  Pt is aware of plan.   Domenic Moras, PA-C 06/04/15 2021  Julianne Rice, MD 06/05/15 2351

## 2015-06-05 ENCOUNTER — Observation Stay (HOSPITAL_BASED_OUTPATIENT_CLINIC_OR_DEPARTMENT_OTHER): Payer: Medicare Other

## 2015-06-05 DIAGNOSIS — R55 Syncope and collapse: Secondary | ICD-10-CM

## 2015-06-05 DIAGNOSIS — I951 Orthostatic hypotension: Secondary | ICD-10-CM | POA: Diagnosis not present

## 2015-06-05 LAB — GLUCOSE, CAPILLARY
GLUCOSE-CAPILLARY: 105 mg/dL — AB (ref 65–99)
Glucose-Capillary: 77 mg/dL (ref 65–99)

## 2015-06-05 LAB — PHOSPHORUS: PHOSPHORUS: 3.4 mg/dL (ref 2.5–4.6)

## 2015-06-05 LAB — URINE CULTURE

## 2015-06-05 LAB — ECHOCARDIOGRAM COMPLETE
Height: 65 in
Weight: 2448 oz

## 2015-06-05 LAB — MRSA PCR SCREENING: MRSA by PCR: NEGATIVE

## 2015-06-05 LAB — PREALBUMIN: Prealbumin: 19.2 mg/dL (ref 18–38)

## 2015-06-05 LAB — TROPONIN I
Troponin I: <0.03 ng/mL (ref <0.031)
Troponin I: <0.03 ng/mL (ref <0.031)

## 2015-06-05 LAB — TSH: TSH: 0.972 u[IU]/mL (ref 0.350–4.500)

## 2015-06-05 MED ORDER — IPRATROPIUM BROMIDE 0.02 % IN SOLN
0.5000 mg | Freq: Three times a day (TID) | RESPIRATORY_TRACT | Status: DC
  Administered 2015-06-05 (×2): 0.5 mg via RESPIRATORY_TRACT
  Filled 2015-06-05 (×2): qty 2.5

## 2015-06-05 MED ORDER — MAGNESIUM OXIDE 400 (241.3 MG) MG PO TABS
400.0000 mg | ORAL_TABLET | Freq: Two times a day (BID) | ORAL | Status: DC
  Administered 2015-06-05: 400 mg via ORAL
  Filled 2015-06-05: qty 1

## 2015-06-05 MED ORDER — CEFPODOXIME PROXETIL 200 MG PO TABS
200.0000 mg | ORAL_TABLET | Freq: Two times a day (BID) | ORAL | Status: DC
  Administered 2015-06-05: 200 mg via ORAL
  Filled 2015-06-05 (×2): qty 1

## 2015-06-05 MED ORDER — CEFPODOXIME PROXETIL 200 MG PO TABS: 200.0000 mg | ORAL_TABLET | Freq: Two times a day (BID) | ORAL | Status: DC

## 2015-06-05 MED ORDER — BREXPIPRAZOLE 1 MG PO TABS: 4.0000 mg | ORAL_TABLET | Freq: Every day | ORAL | Status: DC

## 2015-06-05 MED ORDER — MAGNESIUM OXIDE 400 (241.3 MG) MG PO TABS
400.0000 mg | ORAL_TABLET | Freq: Two times a day (BID) | ORAL | Status: DC
Start: 2015-06-05 — End: 2017-04-05

## 2015-06-05 MED ORDER — BREXPIPRAZOLE 2 MG PO TABS
4.0000 mg | ORAL_TABLET | Freq: Every morning | ORAL | Status: DC
  Administered 2015-06-05: 4 mg via ORAL
  Filled 2015-06-05: qty 4

## 2015-06-05 NOTE — Evaluation (Signed)
Physical Therapy Evaluation & Discharge Patient Details Name: [name] MRN: [id number] DOB: 1955-03-06 Today's Date: 06/05/2015   History of Present Illness  [name] is a 60 y.o. female with medical history significant of depression, hypertension, diabetes and hyperlipidemia who presented with 2 day history of feeling lightheaded episodes of syncope 4 times in the past 48 hours patient feels lightheaded when she is standing up from sitting position. Had an episode today where she passed out after standing up falling backwards and hitting the back of her head was unconscious for at least 1 minute.  Clinical Impression  Patient presents close to functional baseline.  Did feel weak with ambulation today, but no symptoms of syncope and had just had orthostatics taken by nursing staff prior to evaluation.  Feel she can safely return home with grandaughter assist and no further skilled PT needs at this time.  Did educate in orthostatic precautions and need for proper hydration at home.      Follow Up Recommendations No PT follow up    Equipment Recommendations  None recommended by PT    Recommendations for Other Services       Precautions / Restrictions Precautions Precautions: Fall Precaution Comments: orthostatic      Mobility  Bed Mobility Overal bed mobility: Modified Independent                Transfers Overall transfer level: Needs assistance Equipment used: None Transfers: Sit to/from Stand Sit to Stand: Supervision         General transfer comment: for safety  Ambulation/Gait Ambulation/Gait assistance: Supervision;Min guard Ambulation Distance (Feet): 150 Feet Assistive device: None Gait Pattern/deviations: Step-through pattern;Decreased stride length     General Gait Details: slow pace, no LOB assist due to pt feeling weak and limited distance due to weakness  Stairs            Wheelchair Mobility    Modified Rankin (Stroke  Patients Only)       Balance Overall balance assessment: No apparent balance deficits (not formally assessed)                                           Pertinent Vitals/Pain Pain Assessment: No/denies pain    Home Living   Living Arrangements: Other relatives Advertising account executive) Available Help at Discharge: Family Type of Home: Mobile home Home Access: Stairs to enter   Technical brewer of Steps: 2 Home Layout: One level Home Equipment: Shower seat      Prior Function Level of Independence: Needs assistance      ADL's / Homemaking Assistance Needed: grandaughter does most housework, cooking        Journalist, newspaper        Extremity/Trunk Assessment   Upper Extremity Assessment: Generalized weakness           Lower Extremity Assessment: Generalized weakness      Cervical / Trunk Assessment: Kyphotic  Communication   Communication: No difficulties  Cognition Arousal/Alertness: Awake/alert Behavior During Therapy: Flat affect Overall Cognitive Status: Within Functional Limits for tasks assessed                      General Comments General comments (skin integrity, edema, etc.): Educated in orthostatic precautions to sit and even stand for several moments prior to moving and to sit back down if feeling too lightheaded to continue.  Also discussed importance of water and proper hydration.    Exercises        Assessment/Plan    PT Assessment Patent does not need any further PT services  PT Diagnosis Generalized weakness   PT Problem List    PT Treatment Interventions     PT Goals (Current goals can be found in the Care Plan section) Acute Rehab PT Goals PT Goal Formulation: All assessment and education complete, DC therapy    Frequency     Barriers to discharge        Co-evaluation               End of Session Equipment Utilized During Treatment: Gait belt Activity Tolerance: Patient tolerated treatment  well Patient left: in chair;with call bell/phone within reach;with chair alarm set      Functional Assessment Tool Used: Clinical Judgement Functional Limitation: Self care Self Care Current Status [id number]): At least 1 percent but less than 20 percent impaired, limited or restricted Self Care Goal Status [id number]): 0 percent impaired, limited or restricted Self Care Discharge Status [phone]): 0 percent impaired, limited or restricted    Time: 1017-1040 PT Time Calculation (min) (ACUTE ONLY): 23 min   Charges:   PT Evaluation $PT Eval Moderate Complexity: 1 Procedure PT Treatments $Self Care/Home Management: 8-22   PT G Codes:   PT G-Codes **NOT FOR INPATIENT CLASS** Functional Assessment Tool Used: Clinical Judgement Functional Limitation: Self care Self Care Current Status [id number]): At least 1 percent but less than 20 percent impaired, limited or restricted Self Care Goal Status [id number]): 0 percent impaired, limited or restricted Self Care Discharge Status [phone]): 0 percent impaired, limited or restricted    Reginia Naas 06/05/2015, 6:38 PM  Magda Kiel, [place] 06/05/2015

## 2015-06-05 NOTE — Consult Note (Signed)
WOC wound consult note Reason for Consult: Asked to see for treatment recommendations for pinpoint opening in midline incision draining moderate amounts of serous to light yellow exudate. Wound type: surgical Pressure Ulcer POA: No Measurement:0.2cm round x 1cm deep Wound [id number] to see into wound. Cotton tipped applicator is too large for insertion into aperture; I measured depth by gently inserting sterile wood end of stick.  End comes out of wound with only serous exudate on it. Drainage (amount, consistency, odor) Moderate amount of serous to light yellow exudate, no odor.  Patient is currently with an ABD pad covering wound and it is saturated. She does not know when it was applied. Periwound maceration and mild erythema. Periwound:See above. Dressing procedure/placement/frequency: It is outside the scope of practice of [place] nursing to open the aperture further; suggest surgical consult or referral to the operating MD in her/his office for follow up. If you agree, please order/arrange. In the meantime, guidance for topical care is provided for the bedside Nursing staff using a silver hydrofiber dressing that will be able to not only absorb and prevent periwound tissue damage, but will also donate antimicrobial properties (silver) to the area. [facility] nursing team will not follow, but will remain available to this patient, the nursing and medical teams.  Please re-consult if needed. Thanks, Maudie Flakes, MSN, RN, [place], Arther Abbott  Pager# [phone]

## 2015-06-05 NOTE — Progress Notes (Signed)
  Echocardiogram 2D Echocardiogram has been performed.  Johny Chess 06/05/2015, 2:26 PM

## 2015-06-05 NOTE — Discharge Instructions (Signed)

## 2015-06-05 NOTE — Discharge Summary (Signed)
[facility]  Physician Discharge Summary   Patient ID: [name] MRN: [id number] DOB/AGE: [birth year] 60 y.o.  Admit date: 06/04/2015 Discharge date: 06/05/2015  PCP: Barbette Merino, MD  DISCHARGE DIAGNOSES:  Active Problems:   Hypertension   COPD (chronic obstructive pulmonary disease) (HCC)   Asthma   Diabetes mellitus without complication (HCC)   Hypokalemia   Orthostatic hypotension   Syncope   Head injury with loss of consciousness (HCC)   Dehydration   Accelerated hypertension   Syncope and collapse   Pyuria   Tobacco abuse   RECOMMENDATIONS FOR OUTPATIENT FOLLOW UP: 1. Close Follow-up with PCP for orthostatic hypotension 2. Urine culture report is pending 3. Moderate pulmonary hypertension noted on ECHO.   DISCHARGE CONDITION: fair  Diet recommendation: As before  Filed Weights   06/04/15 1412 06/05/15 0349  Weight: 68.947 kg (152 lb) 69.4 kg (153 lb)    INITIAL HISTORY: 60 year old Caucasian female with past medical history of depression, hypertension, diabetes, presented with 2 day history of feeling lightheaded with episodes of syncope. Her presentation was suspicious for orthostatic hypotension. This was confirmed by orthostatic vital signs.  Consultations:  None  Procedures: Transthoracic echocardiogram Study Conclusions  - Left ventricle: The cavity size was normal. There was mild focal  basal hypertrophy of the septum. Systolic function was normal.  The estimated ejection fraction was in the range of 55% to 60%.  Wall motion was normal; there were no regional wall motion  abnormalities. Doppler parameters are consistent with abnormal  left ventricular relaxation (grade 1 diastolic dysfunction).  Doppler parameters are consistent with elevated ventricular  end-diastolic filling pressure. - Aortic valve: Trileaflet; mildly thickened, mildly calcified  leaflets. There was moderate regurgitation. - Aortic root: The aortic  root was normal in size. - Left atrium: The atrium was normal in size. - Right ventricle: The cavity size was moderately dilated. Wall  thickness was normal. Systolic function was normal. - Right atrium: The atrium was normal in size. - Tricuspid valve: There was mild regurgitation. - Pulmonary arteries: Systolic pressure was moderately increased.  PA peak pressure: 53 mm Hg (S). - Inferior vena cava: The vessel was normal in size. - Pericardium, extracardiac: There was no pericardial effusion.  Impressions:  - When compared to the prior study from 01/05/2014 there is new  moderate pulmonary hypertension  HOSPITAL COURSE:   Syncope, most likely secondary to orthostatic hypotension Patient feels better. She was hydrated. Orthostatics have improved. Blood pressure did drop some when she was in the standing position. However, she did not have any symptoms. She was ambulated by physical therapy. She was told that she needs to get up slowly from lying or sitting position and to not start walking immediately after standing up. She was told that if this does not help, she should wear compression stockings. And she should talk to her PCP regarding pharmacological treatment. Echocardiogram is as above. Elevated PA pressures discussed with patient. She will discuss further with her PCP. Troponins are normal.  UTI Patient was started on antibiotics. Cultures are pending. She will be discharged on oral Vantin.  History of asthma/COPD Stable. Patient strongly encouraged to discontinue smoking.  Accelerated hypertension Blood pressure was elevated at the time of admission. Subsequently noted to be within normal range. Outpatient follow-up  Hypokalemia and hypomagnesemia These were repleted  Mild Dehydration Patient was given IV fluids. Feels better this morning.  Chronic Abdominal Wound Follow up with Dr. Excell Seltzer. No evidence for active infection. Stable per patient.  Overall improved.  Patient wishes to go home. Scott for discharge home today.    PERTINENT LABS:  The results of significant diagnostics from this hospitalization (including imaging, microbiology, ancillary and laboratory) are listed below for reference.    Microbiology: Recent Results (from the past 240 hour(s))  Urine culture     Status: None   Collection Time: 06/04/15  7:41 PM  Result Value Ref Range Status   Specimen Description URINE, RANDOM  Final   Special Requests NONE  Final   Culture MULTIPLE SPECIES PRESENT, SUGGEST RECOLLECTION  Final   Report Status 06/05/2015 FINAL  Final  MRSA PCR Screening     Status: None   Collection Time: 06/05/15  8:23 AM  Result Value Ref Range Status   MRSA by PCR NEGATIVE NEGATIVE Final    Comment:        The GeneXpert MRSA Assay (FDA approved for NASAL specimens only), is one component of a comprehensive MRSA colonization surveillance program. It is not intended to diagnose MRSA infection nor to guide or monitor treatment for MRSA infections.      Labs: Basic Metabolic Panel:  Recent Labs Lab 06/04/15 1450 06/04/15 2104 06/05/15 0358  NA 140  --   --   K 3.3*  --   --   CL 100*  --   --   CO2 27  --   --   GLUCOSE 126*  --   --   BUN 8  --   --   CREATININE 0.79  --   --   CALCIUM 9.3  --   --   MG  --  1.5*  --   PHOS  --  3.1 3.4   Liver Function Tests:  Recent Labs Lab 06/04/15 1450  AST 18  ALT 14  ALKPHOS 68  BILITOT 0.7  PROT 7.4  ALBUMIN 3.5   CBC:  Recent Labs Lab 06/04/15 1450  WBC 10.2  NEUTROABS 5.8  HGB 16.0*  HCT 47.5*  MCV 88.3  PLT 207   Cardiac Enzymes:  Recent Labs Lab 06/04/15 2104 06/05/15 0358 06/05/15 0835  TROPONINI 0.04* <0.03 <0.03    CBG:  Recent Labs Lab 06/04/15 2244 06/05/15 0749 06/05/15 1156  GLUCAP 201* 105* 77     IMAGING STUDIES Dg Chest 2 View  06/04/2015  CLINICAL DATA:  Cough for 2 days EXAM: CHEST  2 VIEW COMPARISON:  03/30/2015 FINDINGS: The heart size and  mediastinal contours are within normal limits. Both lungs are clear. The visualized skeletal structures are unremarkable. IMPRESSION: No acute abnormality noted. Electronically Signed   By: Inez Catalina M.D.   On: 06/04/2015 16:10   Ct Head Wo Contrast  06/04/2015  CLINICAL DATA:  Fall. Syncope. Repeated loss of consciousness. Diabetes and hypertension. EXAM: CT HEAD WITHOUT CONTRAST TECHNIQUE: Contiguous axial images were obtained from the base of the skull through the vertex without intravenous contrast. COMPARISON:  03/30/2015. FINDINGS: Sinuses/Soft tissues: No significant soft tissue swelling. Clear paranasal sinuses and mastoid air cells. Cerumen in the external ear canals bilaterally. Intracranial: No mass lesion, hemorrhage, hydrocephalus, acute infarct, intra-axial, or extra-axial fluid collection. IMPRESSION: Normal head CT. Electronically Signed   By: Abigail Miyamoto M.D.   On: 06/04/2015 16:43   Dg Hip Unilat With Pelvis 2-3 Views Left  06/04/2015  CLINICAL DATA:  Left hip pain after multiple falls. Patient reports syncope x4 in the past 2 days. EXAM: DG HIP (WITH OR WITHOUT PELVIS) 2-3V LEFT COMPARISON:  None. FINDINGS: The  cortical margins of the bony pelvis and left hip are intact. No fracture. Pubic symphysis and sacroiliac joints are congruent. Both femoral heads are well-seated in the respective acetabula. IMPRESSION: No fracture or subluxation of the pelvis or left hip. Electronically Signed   By: Jeb Levering M.D.   On: 06/04/2015 18:37    DISCHARGE EXAMINATION: Filed Vitals:   06/05/15 0907 06/05/15 1013 06/05/15 1100 06/05/15 1528  BP:  128/69 129/72   Pulse:  84 91   Temp:  98.2 F (36.8 C) 98.3 F (36.8 C)   TempSrc:  Oral Oral   Resp:  15 14   Height:      Weight:      SpO2: 95% 94% 92% 97%   General appearance: alert, cooperative, appears stated age and no distress Resp: clear to auscultation bilaterally Cardio: regular rate and rhythm, S1, S2 normal, no murmur,  click, rub or gallop GI: soft, non-tender; bowel sounds normal; no masses,  no organomegaly Extremities: extremities normal, atraumatic, no cyanosis or edema Neurologic: Alert and oriented X 3, normal strength and tone. Normal symmetric reflexes. Normal coordination and gait  DISPOSITION: Home  Discharge Instructions    Call MD for:  difficulty breathing, headache or visual disturbances    Complete by:  As directed      Call MD for:  extreme fatigue    Complete by:  As directed      Call MD for:  persistant dizziness or light-headedness    Complete by:  As directed      Call MD for:  persistant nausea and vomiting    Complete by:  As directed      Call MD for:  severe uncontrolled pain    Complete by:  As directed      Call MD for:  temperature >100.4    Complete by:  As directed      Discharge instructions    Complete by:  As directed   Please follow up with Dr. Jonelle Sidle. Please stop smoking. Please get up slowly from sitting or lying position to avoid blackouts. Use compressions stockings if still fell lightheaded despite getting up slowly.    You were cared for by a hospitalist during your hospital stay. If you have any questions about your discharge medications or the care you received while you were in the hospital after you are discharged, you can call the unit and asked to speak with the hospitalist on call if the hospitalist that took care of you is not available. Once you are discharged, your primary care physician will handle any further medical issues. Please note that NO REFILLS for any discharge medications will be authorized once you are discharged, as it is imperative that you return to your primary care physician (or establish a relationship with a primary care physician if you do not have one) for your aftercare needs so that they can reassess your need for medications and monitor your lab values. If you do not have a primary care physician, you can call [phone] for a physician  referral.     Increase activity slowly    Complete by:  As directed            ALLERGIES:  Allergies  Allergen Reactions  . Contrast Media [Iodinated Diagnostic Agents] Nausea And Vomiting  . Doxycycline Nausea And Vomiting  . Erythromycin Nausea And Vomiting  . Penicillins Itching and Rash    All over the body.  . Metronidazole Hives and Rash  Current Discharge Medication List    START taking these medications   Details  cefpodoxime (VANTIN) 200 MG tablet Take 1 tablet (200 mg total) by mouth every 12 (twelve) hours. For 7 days Qty: 14 tablet, Refills: 0    magnesium oxide (MAG-OX) 400 (241.3 Mg) MG tablet Take 1 tablet (400 mg total) by mouth 2 (two) times daily. For 7 days Qty: 14 tablet, Refills: 0      CONTINUE these medications which have NOT CHANGED   Details  albuterol (PROVENTIL HFA;VENTOLIN HFA) 108 (90 BASE) MCG/ACT inhaler Inhale 2 puffs into the lungs 2 (two) times daily as needed for wheezing.    albuterol (PROVENTIL) (2.5 MG/3ML) 0.083% nebulizer solution Take 3 mLs (2.5 mg total) by nebulization every 4 (four) hours as needed for wheezing or shortness of breath. Qty: 75 mL, Refills: 3    alprazolam (XANAX) 2 MG tablet Take 0.5 tablets (1 mg total) by mouth at bedtime as needed for sleep or anxiety (Can take up to 3 thimes daily prn). Patient takes 1/2 tablet by mouth four times a day Qty: 30 tablet, Refills: 0    glipiZIDE (GLUCOTROL) 10 MG tablet Take 10 mg by mouth daily before breakfast.    HYDROcodone-acetaminophen (NORCO) 10-325 MG tablet Take 1 tablet by mouth every 6 (six) hours as needed for moderate pain.  Refills: 0    LANTUS SOLOSTAR 100 UNIT/ML Solostar Pen Inject 14 Units into the skin daily as needed (for hyperglycemic events, not regularly).  Refills: 2    metFORMIN (GLUCOPHAGE) 500 MG tablet Take 500 mg by mouth 2 (two) times daily with a meal.    nortriptyline (PAMELOR) 25 MG capsule Take 75 mg by mouth at bedtime. Refills: 5      REXULTI 4 MG TABS Take 4 mg by mouth every morning. Refills: 4    SPIRIVA HANDIHALER 18 MCG inhalation capsule Place 18 mcg into inhaler and inhale daily.  Refills: 2    venlafaxine XR (EFFEXOR-XR) 150 MG 24 hr capsule Take 150 mg by mouth every morning. Refills: 3    Vitamin D, Ergocalciferol, (DRISDOL) 50000 UNITS CAPS Take 50,000 Units by mouth every 7 (seven) days. On thursday      STOP taking these medications     cephALEXin (KEFLEX) 500 MG capsule        Follow-up Information    Follow up with GARBA,LAWAL, MD. Schedule an appointment as soon as possible for a visit in 1 week.   Specialty:  Internal Medicine   Why:  post hospitalization follow up   Contact information:   [place]. [place] [address] [phone]       TOTAL DISCHARGE TIME: 35 mins  [facility] Pager [phone]  06/05/2015, 4:04 PM

## 2015-06-07 LAB — HEMOGLOBIN A1C
Hgb A1c MFr Bld: 6.6 % — ABNORMAL HIGH (ref 4.8–5.6)
Mean Plasma Glucose: 143 mg/dL

## 2015-06-16 ENCOUNTER — Encounter (HOSPITAL_COMMUNITY): Payer: Self-pay

## 2015-06-16 ENCOUNTER — Inpatient Hospital Stay (HOSPITAL_COMMUNITY)
Admission: EM | Admit: 2015-06-16 | Discharge: 2015-06-19 | DRG: 191 | Disposition: A | Discharge status: Home / Self Care | Payer: Medicare Other | Attending: Internal Medicine | Admitting: Internal Medicine

## 2015-06-16 ENCOUNTER — Emergency Department (HOSPITAL_COMMUNITY): Payer: Medicare Other

## 2015-06-16 DIAGNOSIS — Z6825 Body mass index (BMI) 25.0-25.9, adult: Secondary | ICD-10-CM

## 2015-06-16 DIAGNOSIS — E860 Dehydration: Secondary | ICD-10-CM | POA: Diagnosis not present

## 2015-06-16 DIAGNOSIS — E785 Hyperlipidemia, unspecified: Secondary | ICD-10-CM | POA: Diagnosis present

## 2015-06-16 DIAGNOSIS — Z888 Allergy status to other drugs, medicaments and biological substances status: Secondary | ICD-10-CM

## 2015-06-16 DIAGNOSIS — F32A Depression, unspecified: Secondary | ICD-10-CM | POA: Diagnosis present

## 2015-06-16 DIAGNOSIS — F329 Major depressive disorder, single episode, unspecified: Secondary | ICD-10-CM | POA: Diagnosis present

## 2015-06-16 DIAGNOSIS — I951 Orthostatic hypotension: Secondary | ICD-10-CM | POA: Diagnosis present

## 2015-06-16 DIAGNOSIS — R05 Cough: Secondary | ICD-10-CM

## 2015-06-16 DIAGNOSIS — F419 Anxiety disorder, unspecified: Secondary | ICD-10-CM | POA: Diagnosis not present

## 2015-06-16 DIAGNOSIS — Z79899 Other long term (current) drug therapy: Secondary | ICD-10-CM

## 2015-06-16 DIAGNOSIS — J441 Chronic obstructive pulmonary disease with (acute) exacerbation: Secondary | ICD-10-CM | POA: Diagnosis not present

## 2015-06-16 DIAGNOSIS — K219 Gastro-esophageal reflux disease without esophagitis: Secondary | ICD-10-CM | POA: Diagnosis present

## 2015-06-16 DIAGNOSIS — R059 Cough, unspecified: Secondary | ICD-10-CM

## 2015-06-16 DIAGNOSIS — R0902 Hypoxemia: Secondary | ICD-10-CM

## 2015-06-16 DIAGNOSIS — F909 Attention-deficit hyperactivity disorder, unspecified type: Secondary | ICD-10-CM | POA: Diagnosis present

## 2015-06-16 DIAGNOSIS — Z794 Long term (current) use of insulin: Secondary | ICD-10-CM

## 2015-06-16 DIAGNOSIS — I10 Essential (primary) hypertension: Secondary | ICD-10-CM | POA: Diagnosis present

## 2015-06-16 DIAGNOSIS — E119 Type 2 diabetes mellitus without complications: Secondary | ICD-10-CM

## 2015-06-16 DIAGNOSIS — E876 Hypokalemia: Secondary | ICD-10-CM | POA: Diagnosis present

## 2015-06-16 DIAGNOSIS — Z880 Allergy status to penicillin: Secondary | ICD-10-CM

## 2015-06-16 DIAGNOSIS — S31109A Unspecified open wound of abdominal wall, unspecified quadrant without penetration into peritoneal cavity, initial encounter: Secondary | ICD-10-CM | POA: Diagnosis present

## 2015-06-16 DIAGNOSIS — Z91041 Radiographic dye allergy status: Secondary | ICD-10-CM

## 2015-06-16 DIAGNOSIS — J189 Pneumonia, unspecified organism: Secondary | ICD-10-CM

## 2015-06-16 DIAGNOSIS — M1990 Unspecified osteoarthritis, unspecified site: Secondary | ICD-10-CM | POA: Diagnosis present

## 2015-06-16 DIAGNOSIS — X58XXXA Exposure to other specified factors, initial encounter: Secondary | ICD-10-CM | POA: Diagnosis present

## 2015-06-16 DIAGNOSIS — F17210 Nicotine dependence, cigarettes, uncomplicated: Secondary | ICD-10-CM | POA: Diagnosis present

## 2015-06-16 DIAGNOSIS — E440 Moderate protein-calorie malnutrition: Secondary | ICD-10-CM | POA: Diagnosis present

## 2015-06-16 DIAGNOSIS — R0602 Shortness of breath: Secondary | ICD-10-CM | POA: Diagnosis not present

## 2015-06-16 DIAGNOSIS — Z7984 Long term (current) use of oral hypoglycemic drugs: Secondary | ICD-10-CM

## 2015-06-16 DIAGNOSIS — Z881 Allergy status to other antibiotic agents status: Secondary | ICD-10-CM

## 2015-06-16 HISTORY — DX: Squamous cell carcinoma of skin of unspecified parts of face: C44.320

## 2015-06-16 HISTORY — DX: Type 2 diabetes mellitus without complications: E11.9

## 2015-06-16 HISTORY — DX: Nontoxic single thyroid nodule: E04.1

## 2015-06-16 HISTORY — DX: Basal cell carcinoma of skin of unspecified parts of face: C44.310

## 2015-06-16 HISTORY — DX: Malignant neoplasm of cervix uteri, unspecified: C53.9

## 2015-06-16 HISTORY — DX: Dependence on supplemental oxygen: Z99.81

## 2015-06-16 HISTORY — DX: Unspecified chronic bronchitis: J42

## 2015-06-16 LAB — GLUCOSE, CAPILLARY: GLUCOSE-CAPILLARY: 288 mg/dL — AB (ref 65–99)

## 2015-06-16 LAB — URINALYSIS, ROUTINE W REFLEX MICROSCOPIC
BILIRUBIN URINE: NEGATIVE
Glucose, UA: NEGATIVE mg/dL
Hgb urine dipstick: NEGATIVE
KETONES UR: NEGATIVE mg/dL
NITRITE: NEGATIVE
Protein, ur: NEGATIVE mg/dL
SPECIFIC GRAVITY, URINE: 1.009 (ref 1.005–1.030)
pH: 6.5 (ref 5.0–8.0)

## 2015-06-16 LAB — COMPREHENSIVE METABOLIC PANEL
ALK PHOS: 63 U/L (ref 38–126)
ALT: 12 U/L — AB (ref 14–54)
ANION GAP: 12 (ref 5–15)
AST: 19 U/L (ref 15–41)
Albumin: 3.5 g/dL (ref 3.5–5.0)
BUN: 5 mg/dL — ABNORMAL LOW (ref 6–20)
CALCIUM: 9.3 mg/dL (ref 8.9–10.3)
CO2: 27 mmol/L (ref 22–32)
CREATININE: 0.66 mg/dL (ref 0.44–1.00)
Chloride: 99 mmol/L — ABNORMAL LOW (ref 101–111)
GLUCOSE: 176 mg/dL — AB (ref 65–99)
Potassium: 3.4 mmol/L — ABNORMAL LOW (ref 3.5–5.1)
Sodium: 138 mmol/L (ref 135–145)
TOTAL PROTEIN: 7.1 g/dL (ref 6.5–8.1)
Total Bilirubin: 0.3 mg/dL (ref 0.3–1.2)

## 2015-06-16 LAB — CBC WITH DIFFERENTIAL/PLATELET
Basophils Absolute: 0.1 10*3/uL (ref 0.0–0.1)
Basophils Relative: 1 %
EOS PCT: 1 %
Eosinophils Absolute: 0.1 10*3/uL (ref 0.0–0.7)
HCT: 47.4 % — ABNORMAL HIGH (ref 36.0–46.0)
Hemoglobin: 15.7 g/dL — ABNORMAL HIGH (ref 12.0–15.0)
LYMPHS ABS: 3.9 10*3/uL (ref 0.7–4.0)
LYMPHS PCT: 37 %
MCH: 29.2 pg (ref 26.0–34.0)
MCHC: 33.1 g/dL (ref 30.0–36.0)
MCV: 88.1 fL (ref 78.0–100.0)
MONOS PCT: 7 %
Monocytes Absolute: 0.7 10*3/uL (ref 0.1–1.0)
Neutro Abs: 5.7 10*3/uL (ref 1.7–7.7)
Neutrophils Relative %: 54 %
PLATELETS: 208 10*3/uL (ref 150–400)
RBC: 5.38 MIL/uL — AB (ref 3.87–5.11)
RDW: 12.8 % (ref 11.5–15.5)
WBC: 10.4 10*3/uL (ref 4.0–10.5)

## 2015-06-16 LAB — I-STAT TROPONIN, ED
TROPONIN I, POC: 0.01 ng/mL (ref 0.00–0.08)
Troponin i, poc: 0.03 ng/mL (ref 0.00–0.08)

## 2015-06-16 LAB — I-STAT VENOUS BLOOD GAS, ED
Acid-Base Excess: 3 mmol/L — ABNORMAL HIGH (ref 0.0–2.0)
BICARBONATE: 30.6 meq/L — AB (ref 20.0–24.0)
O2 Saturation: 83 %
PCO2 VEN: 54.1 mmHg — AB (ref 45.0–50.0)
PH VEN: 7.36 — AB (ref 7.250–7.300)
TCO2: 32 mmol/L (ref 0–100)
pO2, Ven: 51 mmHg — ABNORMAL HIGH (ref 31.0–45.0)

## 2015-06-16 LAB — TSH: TSH: 0.832 u[IU]/mL (ref 0.350–4.500)

## 2015-06-16 LAB — URINE MICROSCOPIC-ADD ON: RBC / HPF: NONE SEEN RBC/hpf (ref 0–5)

## 2015-06-16 LAB — I-STAT CG4 LACTIC ACID, ED
Lactic Acid, Venous: 1.66 mmol/L (ref 0.5–2.0)
Lactic Acid, Venous: 1.58 mmol/L (ref 0.5–2.0)

## 2015-06-16 MED ORDER — ALPRAZOLAM 0.5 MG PO TABS
1.0000 mg | ORAL_TABLET | Freq: Three times a day (TID) | ORAL | Status: DC
  Administered 2015-06-17 – 2015-06-19 (×8): 1 mg via ORAL
  Filled 2015-06-16 (×8): qty 2

## 2015-06-16 MED ORDER — BREXPIPRAZOLE 1 MG PO TABS
4.0000 mg | ORAL_TABLET | Freq: Every morning | ORAL | Status: DC
  Administered 2015-06-17 – 2015-06-19 (×3): 4 mg via ORAL
  Filled 2015-06-16 (×3): qty 4

## 2015-06-16 MED ORDER — ALPRAZOLAM 0.5 MG PO TABS
2.0000 mg | ORAL_TABLET | Freq: Every day | ORAL | Status: DC
  Administered 2015-06-16 – 2015-06-18 (×3): 2 mg via ORAL
  Filled 2015-06-16 (×3): qty 4

## 2015-06-16 MED ORDER — ALBUTEROL SULFATE (2.5 MG/3ML) 0.083% IN NEBU: 2.5000 mg | INHALATION_SOLUTION | RESPIRATORY_TRACT | Status: DC | PRN

## 2015-06-16 MED ORDER — INSULIN GLARGINE 100 UNIT/ML SOLOSTAR PEN: 7.0000 [IU] | PEN_INJECTOR | Freq: Every day | SUBCUTANEOUS | Status: DC | PRN

## 2015-06-16 MED ORDER — IPRATROPIUM-ALBUTEROL 0.5-2.5 (3) MG/3ML IN SOLN
3.0000 mL | Freq: Once | RESPIRATORY_TRACT | Status: AC
  Administered 2015-06-16: 3 mL via RESPIRATORY_TRACT
  Filled 2015-06-16: qty 3

## 2015-06-16 MED ORDER — ENSURE ENLIVE PO LIQD
237.0000 mL | Freq: Two times a day (BID) | ORAL | Status: DC
  Administered 2015-06-17: 237 mL via ORAL

## 2015-06-16 MED ORDER — HYDROCODONE-ACETAMINOPHEN 10-325 MG PO TABS
1.0000 | ORAL_TABLET | Freq: Four times a day (QID) | ORAL | Status: DC | PRN
  Administered 2015-06-16 – 2015-06-19 (×6): 1 via ORAL
  Filled 2015-06-16 (×6): qty 1

## 2015-06-16 MED ORDER — ALPRAZOLAM 0.25 MG PO TABS: 1.0000 mg | ORAL_TABLET | Freq: Three times a day (TID) | ORAL | Status: DC

## 2015-06-16 MED ORDER — INSULIN GLARGINE 100 UNIT/ML ~~LOC~~ SOLN
7.0000 [IU] | Freq: Every day | SUBCUTANEOUS | Status: DC | PRN
  Filled 2015-06-16: qty 0.07

## 2015-06-16 MED ORDER — NICOTINE 21 MG/24HR TD PT24
21.0000 mg | MEDICATED_PATCH | Freq: Every day | TRANSDERMAL | Status: DC
  Administered 2015-06-16 – 2015-06-19 (×4): 21 mg via TRANSDERMAL
  Filled 2015-06-16 (×4): qty 1

## 2015-06-16 MED ORDER — VITAMIN D (ERGOCALCIFEROL) 1.25 MG (50000 UNIT) PO CAPS
50000.0000 [IU] | ORAL_CAPSULE | ORAL | Status: DC
  Administered 2015-06-17: 50000 [IU] via ORAL
  Filled 2015-06-16: qty 1

## 2015-06-16 MED ORDER — MAGNESIUM OXIDE 400 (241.3 MG) MG PO TABS
400.0000 mg | ORAL_TABLET | Freq: Two times a day (BID) | ORAL | Status: DC
  Administered 2015-06-16 – 2015-06-19 (×6): 400 mg via ORAL
  Filled 2015-06-16 (×6): qty 1

## 2015-06-16 MED ORDER — NORTRIPTYLINE HCL 25 MG PO CAPS
75.0000 mg | ORAL_CAPSULE | Freq: Every day | ORAL | Status: DC
Start: 2015-06-16 — End: 2015-06-19
  Administered 2015-06-16 – 2015-06-18 (×3): 75 mg via ORAL
  Filled 2015-06-16 (×3): qty 3

## 2015-06-16 MED ORDER — ENOXAPARIN SODIUM 40 MG/0.4ML ~~LOC~~ SOLN
40.0000 mg | Freq: Every day | SUBCUTANEOUS | Status: DC
  Administered 2015-06-16 – 2015-06-18 (×3): 40 mg via SUBCUTANEOUS
  Filled 2015-06-16 (×3): qty 0.4

## 2015-06-16 MED ORDER — IPRATROPIUM-ALBUTEROL 0.5-2.5 (3) MG/3ML IN SOLN
3.0000 mL | RESPIRATORY_TRACT | Status: DC
  Administered 2015-06-17: 3 mL via RESPIRATORY_TRACT
  Filled 2015-06-16: qty 3

## 2015-06-16 MED ORDER — SODIUM CHLORIDE 0.9 % IV BOLUS (SEPSIS)
1000.0000 mL | Freq: Once | INTRAVENOUS | Status: AC
Start: 2015-06-16 — End: 2015-06-16
  Administered 2015-06-16: 1000 mL via INTRAVENOUS

## 2015-06-16 MED ORDER — METHYLPREDNISOLONE SODIUM SUCC 125 MG IJ SOLR
60.0000 mg | Freq: Three times a day (TID) | INTRAMUSCULAR | Status: DC
  Administered 2015-06-17 (×4): 60 mg via INTRAVENOUS
  Filled 2015-06-16 (×4): qty 2

## 2015-06-16 MED ORDER — DM-GUAIFENESIN ER 30-600 MG PO TB12
1.0000 | ORAL_TABLET | Freq: Two times a day (BID) | ORAL | Status: DC
  Administered 2015-06-16: 1 via ORAL
  Filled 2015-06-16: qty 1

## 2015-06-16 MED ORDER — METHYLPREDNISOLONE SODIUM SUCC 125 MG IJ SOLR
125.0000 mg | Freq: Once | INTRAMUSCULAR | Status: AC
  Administered 2015-06-16: 125 mg via INTRAVENOUS
  Filled 2015-06-16: qty 2

## 2015-06-16 MED ORDER — SODIUM CHLORIDE 0.9 % IV BOLUS (SEPSIS)
1000.0000 mL | Freq: Once | INTRAVENOUS | Status: AC
  Administered 2015-06-16: 1000 mL via INTRAVENOUS

## 2015-06-16 MED ORDER — LEVOFLOXACIN IN D5W 750 MG/150ML IV SOLN
750.0000 mg | INTRAVENOUS | Status: DC
  Administered 2015-06-16 – 2015-06-17 (×2): 750 mg via INTRAVENOUS
  Filled 2015-06-16 (×2): qty 150

## 2015-06-16 MED ORDER — SODIUM CHLORIDE 0.9 % IV SOLN
INTRAVENOUS | Status: DC
  Administered 2015-06-16: 100 mL/h via INTRAVENOUS
  Administered 2015-06-18: 16:00:00 via INTRAVENOUS
  Administered 2015-06-18: 125 mL/h via INTRAVENOUS
  Administered 2015-06-18: 07:00:00 via INTRAVENOUS
  Administered 2015-06-19: 125 mL/h via INTRAVENOUS
  Administered 2015-06-19: via INTRAVENOUS

## 2015-06-16 MED ORDER — INSULIN ASPART 100 UNIT/ML ~~LOC~~ SOLN
0.0000 [IU] | Freq: Three times a day (TID) | SUBCUTANEOUS | Status: DC
Start: 2015-06-17 — End: 2015-06-19
  Administered 2015-06-17: 3 [IU] via SUBCUTANEOUS
  Administered 2015-06-17 (×2): 5 [IU] via SUBCUTANEOUS
  Administered 2015-06-18: 2 [IU] via SUBCUTANEOUS
  Administered 2015-06-18 (×2): 5 [IU] via SUBCUTANEOUS
  Administered 2015-06-19: 3 [IU] via SUBCUTANEOUS
  Administered 2015-06-19: 5 [IU] via SUBCUTANEOUS

## 2015-06-16 MED ORDER — POTASSIUM CHLORIDE 20 MEQ/15ML (10%) PO SOLN
20.0000 meq | Freq: Once | ORAL | Status: AC
  Administered 2015-06-16: 20 meq via ORAL
  Filled 2015-06-16: qty 15

## 2015-06-16 MED ORDER — VENLAFAXINE HCL ER 75 MG PO CP24
150.0000 mg | ORAL_CAPSULE | Freq: Every morning | ORAL | Status: DC
  Administered 2015-06-17 – 2015-06-19 (×3): 150 mg via ORAL
  Filled 2015-06-16 (×5): qty 2

## 2015-06-16 MED ORDER — HYDRALAZINE HCL 20 MG/ML IJ SOLN: 5.0000 mg | INTRAMUSCULAR | Status: DC | PRN

## 2015-06-16 NOTE — ED Notes (Signed)
Attempted to in and out cath with second RN. Unable to collect urine. ED resident aware. Pt aware we need urine sample.

## 2015-06-16 NOTE — ED Notes (Signed)
[facility]- pt coming from home with c/o generalized weakness X2 weeks. Pt has fallen twice. Pt also noted to have orthostatic hypotension with EMS. Pt alert and oriented on arrival. Vitals stable with EMS. Pt c/o nausea and was given 4mg  of Zofran.

## 2015-06-16 NOTE — ED Notes (Signed)
Admitting MD at the bedside.  

## 2015-06-16 NOTE — H&P (Signed)
History and Physical    [name] [id number] DOB: 08-02-1955 DOA: 06/16/2015  Referring MD/NP/PA:   PCP: Barbette Merino, MD   Outpatient Specialists: none  Patient coming from:  Home  Chief Complaint: Cough, shortness of breath and generalized weakness  HPI: [name] is a 60 y.o. female with medical history significant of tobacco abuse, COPD, asthma, GERD, depression, anxiety, hypertension, ADHD, hyperlipidemia, diabetes mellitus, chronic abdominal wound, who presented with productive cough and shortness of breath, and generalized weakness.  Patient reports that she has been having cough, shortness of breath in the past 1 to 2 weeks which have been worsening in the past 2 or 3 days. She has greenish sputum production. She does not have fever, chills or chest pain. She has generalized weakness, but no unilateral weakness or tingling sensations in the extremities. No vision change or hearing loss. Patient does not have nausea, vomiting, diarrhea, abdominal pain or symptoms of UTI. Per EMS report, pt is orthostatic.  ED Course: pt was found to have WBC 10.4, negative troponin, negative to 1.66, urinalysis is trace amount of leukocytes, temperature normal, no tachycardia, oxygen saturation 90% on room air, potassium 3.4, creatinine normal, negative chest x-ray for pneumonia. Patient is placed on telemetry bed for observation.  Review of Systems:   General: no fevers, chills, no changes in body weight, has poor appetite, has fatigue HEENT: no blurry vision, hearing changes or sore throat Pulm: has dyspnea, coughing, wheezing CV: no chest pain, no palpitations Abd: no nausea, vomiting, abdominal pain, diarrhea, constipation GU: no dysuria, burning on urination, increased urinary frequency, hematuria  Ext: no leg edema Neuro: no unilateral weakness, numbness, or tingling, no vision change or hearing loss Skin: no rash. Has chronic abdominal wound. MSK: No muscle spasm, no  deformity, no limitation of range of movement in spin Heme: No easy bruising.  Travel history: No recent long distant travel.  Allergy:  Allergies  Allergen Reactions  . Contrast Media [Iodinated Diagnostic Agents] Nausea And Vomiting  . Doxycycline Nausea And Vomiting  . Erythromycin Nausea And Vomiting  . Penicillins Itching and Rash    All over the body.  . Metronidazole Hives and Rash    Past Medical History  Diagnosis Date  . Depression   . Arthritis   . 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 ADD (attention deficit disorder with hyperactivity)   . Hypertension   . COPD (chronic obstructive pulmonary disease) ([place])   . Asthma   . GERD (gastroesophageal reflux disease)   . Anxiety   . Bronchitis   . Gestational diabetes     Past Surgical History  Procedure Laterality Date  . Abdominal hysterectomy    . Spine surgery    . Vulvar excision  2006    Cancer in situ  . Oophorectomy  2006    cervical cancer  . Hernia repair      incisional/ventral hernia repair  . Rectovaginal fistula closure  2012  . Colectomy      Social History:  reports that she has been smoking Cigarettes.  She has been smoking about 1.00 pack per day. She has never used smokeless tobacco. She reports that she does not drink alcohol or use illicit drugs.  Family History:  Family History  Problem Relation Age of Onset  . COPD Father   . Cancer Father     lung  . Cancer Paternal Aunt     lung     Prior to Admission medications   Medication Sig  Start Date End Date Taking? Authorizing Provider  albuterol (PROVENTIL HFA;VENTOLIN HFA) 108 (90 BASE) MCG/ACT inhaler Inhale 2 puffs into the lungs 2 (two) times daily as needed for wheezing.   Yes Historical Provider, MD  albuterol (PROVENTIL) (2.5 MG/3ML) 0.083% nebulizer solution Take 3 mLs (2.5 mg total) by nebulization every 4 (four) hours as needed for wheezing or shortness of breath. 01/05/14  Yes Theodis Blaze, MD  alprazolam Duanne Moron) 2 MG tablet Take  0.5 tablets (1 mg total) by mouth at bedtime as needed for sleep or anxiety (Can take up to 3 thimes daily prn). Patient takes 1/2 tablet by mouth four times a day Patient taking differently: Take 1-2 mg by mouth 4 (four) times daily - after meals and at bedtime. Patient takes 1/2 tab three times daily and 1 tab at bedtime 01/05/14  Yes Theodis Blaze, MD  glipiZIDE (GLUCOTROL) 10 MG tablet Take 10 mg by mouth daily before breakfast.   Yes Historical Provider, MD  HYDROcodone-acetaminophen (NORCO) 10-325 MG tablet Take 1 tablet by mouth every 6 (six) hours as needed for moderate pain.  03/05/15  Yes Historical Provider, MD  LANTUS SOLOSTAR 100 UNIT/ML Solostar Pen Inject 14 Units into the skin daily as needed (for hyperglycemic events, not regularly).  01/27/15  Yes Historical Provider, MD  magnesium oxide (MAG-OX) 400 (241.3 Mg) MG tablet Take 1 tablet (400 mg total) by mouth 2 (two) times daily. For 7 days 06/05/15  Yes Bonnielee Haff, MD  metFORMIN (GLUCOPHAGE) 500 MG tablet Take 500 mg by mouth 2 (two) times daily with a meal.   Yes Historical Provider, MD  nortriptyline (PAMELOR) 25 MG capsule Take 75 mg by mouth at bedtime. 03/10/15  Yes Historical Provider, MD  REXULTI 4 MG TABS Take 4 mg by mouth every morning. 03/11/15  Yes Historical Provider, MD  venlafaxine XR (EFFEXOR-XR) 150 MG 24 hr capsule Take 150 mg by mouth every morning. 05/13/15  Yes Historical Provider, MD  Vitamin D, Ergocalciferol, (DRISDOL) 50000 UNITS CAPS Take 50,000 Units by mouth every 7 (seven) days. On thursday   Yes Historical Provider, MD    Physical Exam: Filed Vitals:   06/16/15 1900 06/16/15 1915 06/16/15 1930 06/16/15 1945  BP: 151/101 159/84 163/83 164/79  Pulse: 76 77 82 86  Temp:      TempSrc:      Resp: 19 16 21 16   SpO2: 98% 100% 97% 94%   General: Not in acute distress. Dry mucus and membrane HEENT:       Eyes: PERRL, EOMI, no scleral icterus.       ENT: No discharge from the ears and nose, no pharynx  injection, no tonsillar enlargement.        Neck: No JVD, no bruit, no mass felt. Heme: No neck lymph node enlargement. Cardiac: S1/S2, RRR, No murmurs, No gallops or rubs. Pulm: has rhonchi and wheezing bilaterally. No rales or rubs. Abd: Soft, nondistended, nontender, no rebound pain, no organomegaly, BS present. GU: No hematuria Ext: No pitting leg edema bilaterally. 2+DP/PT pulse bilaterally. Musculoskeletal: No joint deformities, No joint redness or warmth, no limitation of ROM in spin. Skin: has chronic abodominal wound Neuro: Alert, oriented X3, cranial nerves II-XII grossly intact, moves all extremities normally.  Psych: Patient is not psychotic, no suicidal or hemocidal ideation.  Labs on Admission: I have personally reviewed following labs and imaging studies  CBC:  Recent Labs Lab 06/16/15 1612  WBC 10.4  NEUTROABS 5.7  HGB 15.7*  HCT  47.4*  MCV 88.1  PLT 123XX123   Basic Metabolic Panel:  Recent Labs Lab 06/16/15 1612  NA 138  K 3.4*  CL 99*  CO2 27  GLUCOSE 176*  BUN 5*  CREATININE 0.66  CALCIUM 9.3   GFR: Estimated Creatinine Clearance: 72.3 mL/min (by C-G formula based on Cr of 0.66). Liver Function Tests:  Recent Labs Lab 06/16/15 1612  AST 19  ALT 12*  ALKPHOS 63  BILITOT 0.3  PROT 7.1  ALBUMIN 3.5   No results for input(s): LIPASE, AMYLASE in the last 168 hours. No results for input(s): AMMONIA in the last 168 hours. Coagulation Profile: No results for input(s): INR, PROTIME in the last 168 hours. Cardiac Enzymes: No results for input(s): CKTOTAL, CKMB, CKMBINDEX, TROPONINI in the last 168 hours. BNP (last 3 results) No results for input(s): PROBNP in the last 8760 hours. HbA1C: No results for input(s): HGBA1C in the last 72 hours. CBG: No results for input(s): GLUCAP in the last 168 hours. Lipid Profile: No results for input(s): CHOL, HDL, LDLCALC, TRIG, CHOLHDL, LDLDIRECT in the last 72 hours. Thyroid Function Tests:  Recent Labs   06/16/15 1612  TSH 0.832   Anemia Panel: No results for input(s): VITAMINB12, FOLATE, FERRITIN, TIBC, IRON, RETICCTPCT in the last 72 hours. Urine analysis:    Component Value Date/Time   COLORURINE YELLOW 06/16/2015 1846   APPEARANCEUR CLOUDY* 06/16/2015 1846   LABSPEC 1.009 06/16/2015 1846   PHURINE 6.5 06/16/2015 1846   GLUCOSEU NEGATIVE 06/16/2015 1846   HGBUR NEGATIVE 06/16/2015 1846   BILIRUBINUR NEGATIVE 06/16/2015 1846   KETONESUR NEGATIVE 06/16/2015 1846   PROTEINUR NEGATIVE 06/16/2015 1846   UROBILINOGEN 0.2 01/03/2014 1819   NITRITE NEGATIVE 06/16/2015 1846   LEUKOCYTESUR TRACE* 06/16/2015 1846   Sepsis Labs: @LABRCNTIP (procalcitonin:4,lacticidven:4) )No results found for this or any previous visit (from the past 240 hour(s)).   Radiological Exams on Admission: Dg Chest 2 View  06/16/2015  CLINICAL DATA:  Cough, congestion for 1 week EXAM: CHEST  2 VIEW COMPARISON:  06/04/2015 FINDINGS: Cardiomediastinal silhouette is stable. No acute infiltrate or pleural effusion. No pulmonary edema. Mild degenerative changes mid thoracic spine. IMPRESSION: No active cardiopulmonary disease. Electronically Signed   By: Lahoma Crocker M.D.   On: 06/16/2015 15:35     EKG: Independently reviewed. QTC 456, nonspecific T-wave change  Assessment/Plan Principal Problem:   COPD exacerbation (HCC) Active Problems:   GERD (gastroesophageal reflux disease)   Anxiety   Depression   Hypertension   Diabetes mellitus without complication (HCC)   ADHD (attention deficit hyperactivity disorder)   Hypokalemia   Orthostatic hypotension   Dehydration   Accelerated hypertension   Tobacco abuse   Protein-calorie malnutrition, moderate (HCC)  COPD exacerbation ([place]):  Productive cough and shortness of breath, plus diffused rhonchi and wheezing on lung ausculation are most likely caused by COPD exacerbation. Patient continues to smoke. I discussed the importance of quitting smoking with  patient.  -will place pt on telemetry bed for obs -Nebulizers: Scheduled Duoneb and prn albuterol -Solu-Medrol 60 mg q8h -IV levaquin -Mucinex for cough  -Blood and sputum culture  -Flu pcr -urinary antigen for strep  Orthostatic hypotension and dehydration: Likely due to poor oral intake -IVF: 1L NS, then 100 cc/h  Protein-calorie malnutrition, moderate ([place]): -consult to nutrition  Hypertension: not on meds. She orthostatic on admission. -Monitoring blood pressure closely -prn hydralazine for SBP>175  Diabetes mellitus: A1c 6.6 on 06/05/15, well controlled. On prn Lantus, glipizide and metformin at home -Lantus  7 units daily -SSI  Tobacco abuse: One pack a day for more than 40 years -did counseling -Nicotine patch  Psych problems: Including ADHD, depression, anxiety. Currently stable, no suicidal or homicidal ideation. -Continue home medications (QTc interval 456)  Chronic Abdominal Wound: per previous discharge summary, supposed to follow up with Dr. Excell Seltzer. No evidence for active infection. Stable per patient. -wound care consult  Hypokalemia: K= 3.4 on admission. - Repleted - Check Mg level    DVT ppx:  SQ Lovenox Code Status: Full code Family Communication: None at bed side.  Disposition Plan:  Anticipate discharge back to previous home environment Consults called:  none Admission status: Obs / tele   Date of Service 06/16/2015    Ivor Costa [facility] Pager [phone]  If 7PM-7AM, please contact night-coverage [email or web address] Password [facility] 06/16/2015, 8:19 PM

## 2015-06-16 NOTE — ED Notes (Addendum)
Wrong pt note

## 2015-06-16 NOTE — ED Provider Notes (Signed)
CSN: [id number]     Arrival date & time 06/16/15  1445 History   First MD Initiated Contact with Patient 06/16/15 1459     Chief Complaint  Patient presents with  . Weakness     (Consider location/radiation/quality/duration/timing/severity/associated sxs/prior Treatment) The history is provided by the patient and medical records.     60 year old female with past medical history of hypertension, COPD, depression, who presents with a 2 week history of generalized weakness. The patient states that for the last 2 weeks, she has had progressively worsening generalized weakness. Over the same time period, she has had worsening shortness of breath and cough with sputum production. She tried to increase her home oxygen without any improvement in her symptoms. She describes the weakness as generalized but significantly worse with standing upright as well as exertion. She states that over the last 2 weeks, every time she goes from sitting to standing and begins walking she gets lightheaded and dizzy and has to sit down. This is new for her. Denies any associated chest pain or palpitations. Denies any known fevers. Denies any abdominal pain or dysuria. She has had some nausea but no vomiting.  Past Medical History  Diagnosis Date  . Depression   . 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 ADD (attention deficit disorder with hyperactivity)   . Hypertension   . COPD (chronic obstructive pulmonary disease) ([place])   . Asthma   . GERD (gastroesophageal reflux disease)   . Anxiety   . Chronic bronchitis ([place])   . On home oxygen therapy     "2L at bedtime" (06/16/2015)  . Pneumonia 1980s X 1  . Type II diabetes mellitus ([place])   . Thyroid cyst   . Arthritis     "neck" (06/16/2015)  . Cervical cancer ([place])   . Basal cell carcinoma, face   . Squamous cell carcinoma, face    Past Surgical History  Procedure Laterality Date  . Abdominal hysterectomy    . Vulva surgery  2006    Cancer in situ  . Oophorectomy   2006    cervical cancer  . Rectovaginal fistula closure  2012  . Colectomy    . Back surgery    . Laparoscopic cholecystectomy    . Hernia repair  X 3    incisional/ventral hernia repair "in my stomach where the womb is"  . Excisional hemorrhoidectomy    . Anterior cervical decomp/discectomy fusion    . Tubal ligation    . Wrist surgery Right X 1    "accidentially cut it"  . Wrist surgery Left     "don't remember why"  . Basal cell carcinoma excision      "face"  . Squamous cell carcinoma excision     Family History  Problem Relation Age of Onset  . COPD Father   . Cancer Father     lung  . Cancer Paternal Aunt     lung   Social History  Substance Use Topics  . Smoking status: Current Every Day Smoker -- 1.00 packs/day for 46 years    Types: Cigarettes  . Smokeless tobacco: Never Used  . Alcohol Use: No   OB History    No data available     Review of Systems  Constitutional: Positive for fatigue. Negative for fever and chills.  HENT: Negative for congestion.   Eyes: Negative for visual disturbance.  Respiratory: Positive for cough, shortness of breath and wheezing.   Cardiovascular: Negative for chest pain and leg swelling.  Gastrointestinal: Positive for nausea. Negative for vomiting and abdominal pain.  Genitourinary: Negative for dysuria and flank pain.  Musculoskeletal: Negative for neck pain and neck stiffness.  Skin: Negative for rash.  Allergic/Immunologic: Negative for immunocompromised state.  Neurological: Negative for syncope, weakness and headaches.      Allergies  Contrast media; Doxycycline; Erythromycin; Penicillins; and Metronidazole  Home Medications   Prior to Admission medications   Medication Sig Start Date End Date Taking? Authorizing Provider  albuterol (PROVENTIL HFA;VENTOLIN HFA) 108 (90 BASE) MCG/ACT inhaler Inhale 2 puffs into the lungs 2 (two) times daily as needed for wheezing.   Yes Historical Provider, MD  albuterol  (PROVENTIL) (2.5 MG/3ML) 0.083% nebulizer solution Take 3 mLs (2.5 mg total) by nebulization every 4 (four) hours as needed for wheezing or shortness of breath. 01/05/14  Yes Theodis Blaze, MD  alprazolam Duanne Moron) 2 MG tablet Take 0.5 tablets (1 mg total) by mouth at bedtime as needed for sleep or anxiety (Can take up to 3 thimes daily prn). Patient takes 1/2 tablet by mouth four times a day Patient taking differently: Take 1-2 mg by mouth 4 (four) times daily - after meals and at bedtime. Patient takes 1/2 tab three times daily and 1 tab at bedtime 01/05/14  Yes Theodis Blaze, MD  glipiZIDE (GLUCOTROL) 10 MG tablet Take 10 mg by mouth daily before breakfast.   Yes Historical Provider, MD  HYDROcodone-acetaminophen (NORCO) 10-325 MG tablet Take 1 tablet by mouth every 6 (six) hours as needed for moderate pain.  03/05/15  Yes Historical Provider, MD  LANTUS SOLOSTAR 100 UNIT/ML Solostar Pen Inject 14 Units into the skin daily as needed (for hyperglycemic events, not regularly).  01/27/15  Yes Historical Provider, MD  magnesium oxide (MAG-OX) 400 (241.3 Mg) MG tablet Take 1 tablet (400 mg total) by mouth 2 (two) times daily. For 7 days 06/05/15  Yes Bonnielee Haff, MD  metFORMIN (GLUCOPHAGE) 500 MG tablet Take 500 mg by mouth 2 (two) times daily with a meal.   Yes Historical Provider, MD  nortriptyline (PAMELOR) 25 MG capsule Take 75 mg by mouth at bedtime. 03/10/15  Yes Historical Provider, MD  REXULTI 4 MG TABS Take 4 mg by mouth every morning. 03/11/15  Yes Historical Provider, MD  venlafaxine XR (EFFEXOR-XR) 150 MG 24 hr capsule Take 150 mg by mouth every morning. 05/13/15  Yes Historical Provider, MD  Vitamin D, Ergocalciferol, (DRISDOL) 50000 UNITS CAPS Take 50,000 Units by mouth every 7 (seven) days. On thursday   Yes Historical Provider, MD   BP 177/84 mmHg  Pulse 89  Temp(Src) 98.3 F (36.8 C) (Oral)  Resp 18  Ht 5\' 5"  (1.651 m)  Wt 154.8 kg  BMI 56.79 kg/m2  SpO2 100% Physical Exam   Constitutional: She is oriented to person, place, and time. She appears well-developed and well-nourished. No distress.  Chronically ill-appearing, appears older than stated age  HENT:  Head: Normocephalic.  Mouth/Throat: No oropharyngeal exudate.  Eyes: Conjunctivae are normal. Pupils are equal, round, and reactive to light.  Neck: Normal range of motion. Neck supple.  Cardiovascular: Normal rate, regular rhythm, normal heart sounds and intact distal pulses.  Exam reveals no friction rub.   No murmur heard. Pulmonary/Chest: Effort normal. She has wheezes. She has rhonchi in the right lower field and the left lower field.  Abdominal: Soft. She exhibits no distension. There is no tenderness.  Musculoskeletal: She exhibits no edema.  Neurological: She is alert and oriented to person, place,  and time.  Skin: Skin is warm. No rash noted.  Nursing note and vitals reviewed.   ED Course  Procedures (including critical care time) Labs Review Labs Reviewed  CBC WITH DIFFERENTIAL/PLATELET - Abnormal; Notable for the following:    RBC 5.38 (*)    Hemoglobin 15.7 (*)    HCT 47.4 (*)    All other components within normal limits  COMPREHENSIVE METABOLIC PANEL - Abnormal; Notable for the following:    Potassium 3.4 (*)    Chloride 99 (*)    Glucose, Bld 176 (*)    BUN 5 (*)    ALT 12 (*)    All other components within normal limits  URINALYSIS, ROUTINE W REFLEX MICROSCOPIC (NOT AT [facility]) - Abnormal; Notable for the following:    APPearance CLOUDY (*)    Leukocytes, UA TRACE (*)    All other components within normal limits  URINE MICROSCOPIC-ADD ON - Abnormal; Notable for the following:    Squamous Epithelial / LPF 6-30 (*)    Bacteria, UA FEW (*)    All other components within normal limits  GLUCOSE, CAPILLARY - Abnormal; Notable for the following:    Glucose-Capillary 288 (*)    All other components within normal limits  I-STAT VENOUS BLOOD GAS, ED - Abnormal; Notable for the  following:    pH, Ven 7.360 (*)    pCO2, Ven 54.1 (*)    pO2, Ven 51.0 (*)    Bicarbonate 30.6 (*)    Acid-Base Excess 3.0 (*)    All other components within normal limits  CULTURE, BLOOD (ROUTINE X 2)  CULTURE, BLOOD (ROUTINE X 2)  CULTURE, EXPECTORATED SPUTUM-ASSESSMENT  GRAM STAIN  TSH  INFLUENZA PANEL BY PCR (TYPE A & B, H1N1)  MAGNESIUM  STREP PNEUMONIAE URINARY ANTIGEN  I-STAT TROPOININ, ED  I-STAT CG4 LACTIC ACID, ED  I-STAT TROPOININ, ED  I-STAT CG4 LACTIC ACID, ED    Imaging Review Dg Chest 2 View  06/16/2015  CLINICAL DATA:  Cough, congestion for 1 week EXAM: CHEST  2 VIEW COMPARISON:  06/04/2015 FINDINGS: Cardiomediastinal silhouette is stable. No acute infiltrate or pleural effusion. No pulmonary edema. Mild degenerative changes mid thoracic spine. IMPRESSION: No active cardiopulmonary disease. Electronically Signed   By: Lahoma Crocker M.D.   On: 06/16/2015 15:35   I have personally reviewed and evaluated these images and lab results as part of my medical decision-making.   EKG Interpretation   Date/Time:  Wednesday Jun 16 2015 14:46:09 EDT Ventricular Rate:  97 PR Interval:  121 QRS Duration: 105 QT Interval:  359 QTC Calculation: 456 R Axis:   65 Text Interpretation:  Sinus rhythm No significant change was found  Confirmed by Wyvonnia Dusky  MD, STEPHEN [phone]) on 06/16/2015 2:53:12 PM      MDM  60 yo F with PMHx of COPD, HTN, HLD who p/w a 2-week h/o worsening fatigue, cough, and sputum production. See HPI above. On arrival, VSS but pt dependent on 2L [place]. Exam as above. Primary concern at this time is CAP versus COPD exacerbation with subsequent fatigue. Must also consider UTI. No focal neuro deficits or signs of CVA or TIA. Pt o/w well-appearing and non-toxic. Will check labs, imaging, and likely admit.  Labs, imaging reviewed as above. EKG non-ischemic. CXR read as no acute abnormality but concern for RML/RLL opacity compared to prior on my review. Labs show mild resp  acidosis, hypokalemia, o/w unremarkable. Pt remains tachypneic, fatigued. Will admit for likely acute on chronic COPD exacerbation,  possibly 2/2 early PNA, with increasing O2 requirement and failure to thrive.  Clinical Impression: 1. COPD exacerbation ([place])   2. CAP (community acquired pneumonia)   3. Hypokalemia   4. Hypoxia     Disposition: Admit  Condition: Stable  Pt seen in conjunction with Dr. Vanita Panda.     Duffy Bruce, MD 06/17/15 [id number]  Carmin Muskrat, MD 06/18/15 [phone]

## 2015-06-17 ENCOUNTER — Observation Stay (HOSPITAL_COMMUNITY): Payer: Medicare Other

## 2015-06-17 DIAGNOSIS — F909 Attention-deficit hyperactivity disorder, unspecified type: Secondary | ICD-10-CM | POA: Diagnosis present

## 2015-06-17 DIAGNOSIS — F419 Anxiety disorder, unspecified: Secondary | ICD-10-CM | POA: Diagnosis present

## 2015-06-17 DIAGNOSIS — E440 Moderate protein-calorie malnutrition: Secondary | ICD-10-CM

## 2015-06-17 DIAGNOSIS — Z888 Allergy status to other drugs, medicaments and biological substances status: Secondary | ICD-10-CM | POA: Diagnosis not present

## 2015-06-17 DIAGNOSIS — E876 Hypokalemia: Secondary | ICD-10-CM

## 2015-06-17 DIAGNOSIS — I10 Essential (primary) hypertension: Secondary | ICD-10-CM

## 2015-06-17 DIAGNOSIS — R0602 Shortness of breath: Secondary | ICD-10-CM | POA: Diagnosis present

## 2015-06-17 DIAGNOSIS — E119 Type 2 diabetes mellitus without complications: Secondary | ICD-10-CM | POA: Diagnosis not present

## 2015-06-17 DIAGNOSIS — Z79899 Other long term (current) drug therapy: Secondary | ICD-10-CM | POA: Diagnosis not present

## 2015-06-17 DIAGNOSIS — E860 Dehydration: Secondary | ICD-10-CM | POA: Diagnosis present

## 2015-06-17 DIAGNOSIS — Z91041 Radiographic dye allergy status: Secondary | ICD-10-CM | POA: Diagnosis not present

## 2015-06-17 DIAGNOSIS — F329 Major depressive disorder, single episode, unspecified: Secondary | ICD-10-CM | POA: Diagnosis present

## 2015-06-17 DIAGNOSIS — I951 Orthostatic hypotension: Secondary | ICD-10-CM

## 2015-06-17 DIAGNOSIS — J441 Chronic obstructive pulmonary disease with (acute) exacerbation: Principal | ICD-10-CM

## 2015-06-17 DIAGNOSIS — E785 Hyperlipidemia, unspecified: Secondary | ICD-10-CM | POA: Diagnosis present

## 2015-06-17 DIAGNOSIS — Z881 Allergy status to other antibiotic agents status: Secondary | ICD-10-CM | POA: Diagnosis not present

## 2015-06-17 DIAGNOSIS — Z794 Long term (current) use of insulin: Secondary | ICD-10-CM | POA: Diagnosis not present

## 2015-06-17 DIAGNOSIS — F17210 Nicotine dependence, cigarettes, uncomplicated: Secondary | ICD-10-CM | POA: Diagnosis present

## 2015-06-17 DIAGNOSIS — K219 Gastro-esophageal reflux disease without esophagitis: Secondary | ICD-10-CM

## 2015-06-17 DIAGNOSIS — S31109A Unspecified open wound of abdominal wall, unspecified quadrant without penetration into peritoneal cavity, initial encounter: Secondary | ICD-10-CM | POA: Diagnosis present

## 2015-06-17 DIAGNOSIS — Z720 Tobacco use: Secondary | ICD-10-CM

## 2015-06-17 DIAGNOSIS — Z7984 Long term (current) use of oral hypoglycemic drugs: Secondary | ICD-10-CM | POA: Diagnosis not present

## 2015-06-17 DIAGNOSIS — Z6825 Body mass index (BMI) 25.0-25.9, adult: Secondary | ICD-10-CM | POA: Diagnosis not present

## 2015-06-17 DIAGNOSIS — M1990 Unspecified osteoarthritis, unspecified site: Secondary | ICD-10-CM | POA: Diagnosis present

## 2015-06-17 DIAGNOSIS — Z880 Allergy status to penicillin: Secondary | ICD-10-CM | POA: Diagnosis not present

## 2015-06-17 DIAGNOSIS — X58XXXA Exposure to other specified factors, initial encounter: Secondary | ICD-10-CM | POA: Diagnosis present

## 2015-06-17 LAB — GLUCOSE, CAPILLARY
GLUCOSE-CAPILLARY: 211 mg/dL — AB (ref 65–99)
GLUCOSE-CAPILLARY: 265 mg/dL — AB (ref 65–99)
Glucose-Capillary: 265 mg/dL — ABNORMAL HIGH (ref 65–99)
Glucose-Capillary: 273 mg/dL — ABNORMAL HIGH (ref 65–99)

## 2015-06-17 LAB — BASIC METABOLIC PANEL
ANION GAP: 10 (ref 5–15)
BUN: 7 mg/dL (ref 6–20)
CALCIUM: 9 mg/dL (ref 8.9–10.3)
CO2: 23 mmol/L (ref 22–32)
Chloride: 105 mmol/L (ref 101–111)
Creatinine, Ser: 0.62 mg/dL (ref 0.44–1.00)
Glucose, Bld: 174 mg/dL — ABNORMAL HIGH (ref 65–99)
POTASSIUM: 3.7 mmol/L (ref 3.5–5.1)
Sodium: 138 mmol/L (ref 135–145)

## 2015-06-17 LAB — CBC
HCT: 44.7 % (ref 36.0–46.0)
HEMOGLOBIN: 14.5 g/dL (ref 12.0–15.0)
MCH: 29.2 pg (ref 26.0–34.0)
MCHC: 32.4 g/dL (ref 30.0–36.0)
MCV: 89.9 fL (ref 78.0–100.0)
Platelets: 175 10*3/uL (ref 150–400)
RBC: 4.97 MIL/uL (ref 3.87–5.11)
RDW: 12.9 % (ref 11.5–15.5)
WBC: 6.9 10*3/uL (ref 4.0–10.5)

## 2015-06-17 LAB — INFLUENZA PANEL BY PCR (TYPE A & B)
H1N1 flu by pcr: NOT DETECTED
INFLAPCR: NEGATIVE
Influenza B By PCR: NEGATIVE

## 2015-06-17 LAB — STREP PNEUMONIAE URINARY ANTIGEN: Strep Pneumo Urinary Antigen: NEGATIVE

## 2015-06-17 LAB — MAGNESIUM: MAGNESIUM: 1.5 mg/dL — AB (ref 1.7–2.4)

## 2015-06-17 MED ORDER — LEVALBUTEROL HCL 0.63 MG/3ML IN NEBU
0.6300 mg | INHALATION_SOLUTION | Freq: Four times a day (QID) | RESPIRATORY_TRACT | Status: DC
  Administered 2015-06-17 – 2015-06-18 (×4): 0.63 mg via RESPIRATORY_TRACT
  Filled 2015-06-17 (×4): qty 3

## 2015-06-17 MED ORDER — BUDESONIDE 0.25 MG/2ML IN SUSP
0.2500 mg | Freq: Two times a day (BID) | RESPIRATORY_TRACT | Status: DC
  Administered 2015-06-17 – 2015-06-19 (×5): 0.25 mg via RESPIRATORY_TRACT
  Filled 2015-06-17 (×5): qty 2

## 2015-06-17 MED ORDER — MAGNESIUM SULFATE 4 GM/100ML IV SOLN
4.0000 g | Freq: Once | INTRAVENOUS | Status: AC
  Administered 2015-06-17: 4 g via INTRAVENOUS
  Filled 2015-06-17: qty 100

## 2015-06-17 MED ORDER — LEVALBUTEROL HCL 0.63 MG/3ML IN NEBU
0.6300 mg | INHALATION_SOLUTION | RESPIRATORY_TRACT | Status: DC | PRN
Start: 2015-06-17 — End: 2015-06-19

## 2015-06-17 MED ORDER — ENSURE ENLIVE PO LIQD
237.0000 mL | Freq: Three times a day (TID) | ORAL | Status: DC
  Administered 2015-06-17 – 2015-06-18 (×3): 237 mL via ORAL

## 2015-06-17 MED ORDER — IPRATROPIUM BROMIDE 0.02 % IN SOLN
0.5000 mg | RESPIRATORY_TRACT | Status: DC | PRN
Start: 2015-06-17 — End: 2015-06-19
  Filled 2015-06-17 (×2): qty 2.5

## 2015-06-17 MED ORDER — LORATADINE 10 MG PO TABS
10.0000 mg | ORAL_TABLET | Freq: Every day | ORAL | Status: DC
  Administered 2015-06-17 – 2015-06-19 (×3): 10 mg via ORAL
  Filled 2015-06-17 (×3): qty 1

## 2015-06-17 MED ORDER — SODIUM CHLORIDE 0.9 % IV BOLUS (SEPSIS)
1000.0000 mL | Freq: Once | INTRAVENOUS | Status: AC
  Administered 2015-06-17: 1000 mL via INTRAVENOUS

## 2015-06-17 MED ORDER — ARFORMOTEROL TARTRATE 15 MCG/2ML IN NEBU
15.0000 ug | INHALATION_SOLUTION | Freq: Two times a day (BID) | RESPIRATORY_TRACT | Status: DC
  Administered 2015-06-17 – 2015-06-19 (×5): 15 ug via RESPIRATORY_TRACT
  Filled 2015-06-17 (×4): qty 2

## 2015-06-17 MED ORDER — CETYLPYRIDINIUM CHLORIDE 0.05 % MT LIQD
7.0000 mL | Freq: Two times a day (BID) | OROMUCOSAL | Status: DC
  Administered 2015-06-17 – 2015-06-19 (×4): 7 mL via OROMUCOSAL

## 2015-06-17 MED ORDER — DM-GUAIFENESIN ER 30-600 MG PO TB12
2.0000 | ORAL_TABLET | Freq: Two times a day (BID) | ORAL | Status: DC
  Administered 2015-06-17 – 2015-06-19 (×5): 2 via ORAL
  Filled 2015-06-17 (×5): qty 2

## 2015-06-17 MED ORDER — IPRATROPIUM BROMIDE 0.02 % IN SOLN
0.5000 mg | Freq: Four times a day (QID) | RESPIRATORY_TRACT | Status: DC
  Administered 2015-06-17 – 2015-06-18 (×4): 0.5 mg via RESPIRATORY_TRACT
  Filled 2015-06-17 (×4): qty 2.5

## 2015-06-17 MED ORDER — FLUTICASONE PROPIONATE 50 MCG/ACT NA SUSP
2.0000 | Freq: Every day | NASAL | Status: DC
  Administered 2015-06-19: 2 via NASAL
  Filled 2015-06-17: qty 16

## 2015-06-17 MED ORDER — INSULIN GLARGINE 100 UNIT/ML ~~LOC~~ SOLN
7.0000 [IU] | Freq: Every day | SUBCUTANEOUS | Status: DC
  Administered 2015-06-17 – 2015-06-19 (×3): 7 [IU] via SUBCUTANEOUS
  Filled 2015-06-17 (×3): qty 0.07

## 2015-06-17 NOTE — Progress Notes (Signed)
Initial Nutrition Assessment  DOCUMENTATION CODES:   Non-severe (moderate) malnutrition in context of chronic illness  INTERVENTION:  -Ensure Enlive TID. Each supplement provides 350 kcals and 20 grams of protein. -Continue to monitor nutritional needs.    NUTRITION DIAGNOSIS:   Increased nutrient needs related to chronic illness as evidenced by estimated needs.   GOAL:   Patient will meet greater than or equal to 90% of their needs   MONITOR:   PO intake, Supplement acceptance, Labs, Weight trends, Skin, I & O's  REASON FOR ASSESSMENT:   Consult Assessment of nutrition requirement/status  ASSESSMENT:   60 y.o. female with medical history significant of tobacco abuse, COPD, asthma, GERD, depression, anxiety, hypertension, ADHD, hyperlipidemia, diabetes mellitus, chronic abdominal wound, who presented with productive cough and shortness of breath, and generalized weakness.  Pt seen with granddaughter at bedside.  Pt has poor appetite but granddaughter ensures that she eats at least BID. No N/V or abdominal pain.  Pt reports eating less over the past few months d/t poor appetite.   Pt has had steady weight loss over the past year. Pt has experienced a 21% weight loss in the past 12 months. More recently, pt has experienced a 7.7% weight loss since 03/30/15. This is significant for time frame.  Pt is receiving Ensure and is agreeable to drinking them. Ensure at bedside. Pt had consumed 50% of bottle.  Lunch tray at bedside. Pt reports not liking food. Pt had eating a couple bites of meal.  Will continue to provide Ensure TID to supplement intake.   NFPE: moderate muscle depletion, moderate fat depletion, no edema.   Labs reviewed; CBGs 211-288, low mag (1.5). Meds reviewed; Vitamin D, Mag-Ox  Diet Order:  Diet heart healthy/carb modified Room service appropriate?: Yes; Fluid consistency:: Thin  Skin:  Reviewed, no issues  Last BM:  unknown  Height:   Ht Readings  from Last 1 Encounters:  06/16/15 5\' 5"  (1.651 m)    Weight:   Wt Readings from Last 1 Encounters:  06/17/15 154 lb 12.8 oz (70.217 kg)    Ideal Body Weight:  56.8 kg  BMI:  Body mass index is 25.76 kg/(m^2).  Estimated Nutritional Needs:   Kcal:  1670-1870 (25-27 kcals/kg)  Protein:  100-110 g (1.5 g/kg)  Fluid:  1.6-1.8 L  EDUCATION NEEDS:   No education needs identified at this time  Geoffery Lyons, [place] Dietetic Intern Pager [phone]

## 2015-06-17 NOTE — Progress Notes (Signed)
Flu swab negative, discontinued droplet precautions.

## 2015-06-17 NOTE — Care Management Note (Signed)
Case Management Note Marvetta Gibbons RN, BSN Unit 2W-Case Manager [phone]  Patient Details  Name: DIXIELEE LAMOUR MRN: [id number] Date of Birth: 1956/01/09  Subjective/Objective:  Pt admitted with COPD                  Action/Plan: PTA Pt lived at home- referral for COPD GOLD- on review of chart pt does not meet guidelines for COPD GOLD protocol- has not had 3 admissions in last 6 mo. - CM will continue to follow for any other d/c needs  Expected Discharge Date:                  Expected Discharge Plan:     In-House Referral:     Discharge planning Services     Post Acute Care Choice:    Choice offered to:     DME Arranged:    DME Agency:     HH Arranged:    [place] Agency:     Status of Service:     Medicare Important Message Given:    Date Medicare IM Given:    Medicare IM give by:    Date Additional Medicare IM Given:    Additional Medicare Important Message give by:     If discussed at [place] of Stay Meetings, dates discussed:    Additional Comments:  Dawayne Patricia, RN 06/17/2015, 11:21 AM

## 2015-06-17 NOTE — Consult Note (Signed)
WOC wound consult note Reason for Consult:chronic open area on the abdomen Reviewed notes, patient has been followed by Dr. 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  At her last visit patient reports MD does not plan to operate on her, it is unclear how long ago that was.  Last noted in the chart was 2015 at which time the note indicated the patient had minimal drainage that was contained with use of a bandaid daily.  Wound type: surgical Measurement:0.2cm round x 1cm deep Wound bed/drainage. Unable to see into wound. Sterile qtip used, however only able to pass the stick end into the opening.  Serous drainage, moderate to heavy. No odor Periwound:the tissue around the fistula is wet, and a bit denuded from the drainage.  There is no dressing in place at the time of my assessment. Dressing procedure/placement/frequency: It is outside the scope of practice of [place] nursing to open the aperture further; suggest surgical consult or referral to the operating MD in her/his office for follow up. If you agree, please order/arrange. In the meantime, guidance for topical care is provided for the bedside Nursing staff using a silver hydrofiber dressing that will be able to not only absorb and prevent periwound tissue damage, but will also donate antimicrobial properties (silver) to the area.  Discussed POC with patient and bedside nurse.  Re consult if needed, will not follow at this time. Thanks  Dakia Schifano [place], [place] [phone])

## 2015-06-17 NOTE — Evaluation (Signed)
Occupational Therapy Evaluation Patient Details Name: [name] MRN: [id number] DOB: May 11, 1955 Today's Date: 06/17/2015    History of Present Illness [name] is a 60 y.o. female with medical history significant of tobacco abuse, COPD, asthma, GERD, depression, anxiety, hypertension, ADHD, hyperlipidemia, diabetes mellitus, chronic abdominal wound, who presented with productive cough and shortness of breath, and generalized weakness.   Clinical Impression   Patient presenting with decreased ADL and functional mobility independence secondary to above. Patient independent, but falling "almost everyday" PTA. Patient currently functioning at an overall supervision to min guard assist level. Patient will benefit from acute OT to increase overall independence in the areas of ADLs, functional mobility, and overall safety in order to safely discharge home with 24/7 supervision (pt reports granddaughter can assist).   BP:                                                HR: Supine=151/92                             106 Seated EOB=158/93                    108 Standing=124/85                          115 Standing for 3 min=129/88           123  **During entire session, pt with no complaints of dizziness, lightheadedness, or feelings like she was going to pass out.     Follow Up Recommendations  No OT follow up;Supervision/Assistance - 24 hour (24/7 due to recent multiple falls)    Equipment Recommendations  3 in 1 bedside comode    Recommendations for Other Services  None at this time    Precautions / Restrictions Precautions Precautions: Fall Precaution Comments: orthostatic - watch BP Restrictions Weight Bearing Restrictions: No    Mobility Bed Mobility Overal bed mobility: Needs Assistance Bed Mobility: Supine to Sit     Supine to sit: Supervision     General bed mobility comments: Pt required assistance to move linens out of the way to safely sit EOB.    Transfers Overall transfer level: Needs assistance Equipment used: None Transfers: Sit to/from Stand Sit to Stand: Supervision         General transfer comment: for safety    Balance Overall balance assessment: No apparent balance deficits (not formally assessed);History of Falls (Unsure of reason for falls, pt reports she "blacks out" and falls)    ADL Overall ADL's : Needs assistance/impaired Eating/Feeding: Set up;Sitting   Grooming: Set up;Supervision/safety;Standing;Wash/dry face;Oral care;Wash/dry hands   Upper Body Bathing: Set up;Sitting   Lower Body Bathing: Supervison/ safety;Sit to/from stand   Upper Body Dressing : Set up;Sitting   Lower Body Dressing: Supervision/safety;Sit to/from stand   Toilet Transfer: Min guard;Supervision/safety;Ambulation Toilet Transfer Details (indicate cue type and reason): simulated  Toileting- Clothing Manipulation and Hygiene: Supervision/safety       Functional mobility during ADLs: Supervision/safety;Min guard General ADL Comments: Pt min guard for safety.      Vision Vision Assessment?: No apparent visual deficits          Pertinent Vitals/Pain Pain Assessment: 0-10 Pain Score: 5  Pain Location: back and neck -  chronic  Pain Descriptors / Indicators: Constant Pain Intervention(s): Limited activity within patient's tolerance;Monitored during session     Hand Dominance Right   Extremity/Trunk Assessment Upper Extremity Assessment Upper Extremity Assessment: Generalized weakness   Lower Extremity Assessment Lower Extremity Assessment: Defer to PT evaluation   Cervical / Trunk Assessment Cervical / Trunk Assessment: Kyphotic   Communication Communication Communication: No difficulties   Cognition Arousal/Alertness: Awake/alert Behavior During Therapy: Flat affect Overall Cognitive Status: Within Functional Limits for tasks assessed              Home Living Family/patient expects to be discharged  to:: Private residence Living Arrangements: Other relatives Available Help at Discharge: Family Type of Home: Mobile home Home Access: Stairs to enter Entrance Stairs-Number of Steps: 2   Home Layout: One level     Bathroom Shower/Tub: Walk-in shower;Door   [employer] Toilet: Standard     Home Equipment: Shower seat   Prior Functioning/Environment Level of Independence: Needs assistance    ADL's / Homemaking Assistance Needed: grandaughter does most housework, cooking    OT Diagnosis: Generalized weakness   OT Problem List: Decreased strength;Decreased activity tolerance;Decreased safety awareness;Decreased knowledge of use of DME or AE;Decreased knowledge of precautions;Cardiopulmonary status limiting activity   OT Treatment/Interventions: Self-care/ADL training;Therapeutic exercise;Energy conservation;DME and/or AE instruction;Therapeutic activities;Patient/family education;Balance training    OT Goals(Current goals can be found in the care plan section) Acute Rehab OT Goals Patient Stated Goal: figure out why I keep falling OT Goal Formulation: With patient Time For Goal Achievement: 06/24/15 Potential to Achieve Goals: Good ADL Goals Pt Will Perform Grooming: Independently;standing Pt Will Perform Lower Body Bathing: Independently;sit to/from stand Pt Will Perform Lower Body Dressing: Independently;sit to/from stand Pt Will Transfer to Toilet: ambulating;with modified independence;bedside commode Pt Will Perform Tub/Shower Transfer: Shower transfer;ambulating;3 in 1 Additional ADL Goal #1: Pt will be educated on energy conservation techniques with handout provided. Pt will be able to verbalize at least 3 energy consrvation techniques independently.   OT Frequency: Min 2X/week   Barriers to D/C: None known at this time       Co-evaluation PT/OT/SLP Co-Evaluation/Treatment: Yes Reason for Co-Treatment: For patient/therapist safety   OT goals addressed during session:  ADL's and self-care;Strengthening/ROM (functional mobility)      End of Session Equipment Utilized During Treatment: Gait belt  Activity Tolerance: Patient tolerated treatment well Patient left: in chair;with call bell/phone within reach;Other (comment) (With PT present in room)   Time: 1005-1039 OT Time Calculation (min): 34 min Charges:  OT General Charges $OT Visit: 1 Procedure OT Evaluation $OT Eval Moderate Complexity: 1 Procedure G-Codes: OT G-codes **NOT FOR INPATIENT CLASS** Functional Limitation: Self care Self Care Current Status [id number]): At least 1 percent but less than 20 percent impaired, limited or restricted Self Care Goal Status [id number]): 0 percent impaired, limited or restricted  Chrys Racer , MS, OTR/L, CLT Pager: [phone]   06/17/2015, 12:24 PM

## 2015-06-17 NOTE — Progress Notes (Signed)
PROGRESS NOTE    [name]  [id number] DOB: Jan 09, 1956 DOA: 06/16/2015 PCP: Barbette Merino, MD  Outpatient Specialists:   Assessment & Plan:   Principal Problem:   COPD exacerbation ([place]) Active Problems:   GERD (gastroesophageal reflux disease)   Anxiety   Depression   Hypertension   Diabetes mellitus without complication ([place])   ADHD (attention deficit hyperactivity disorder)   Hypokalemia   Orthostatic hypotension   Dehydration   Accelerated hypertension   Tobacco abuse   Protein-calorie malnutrition, moderate ([place])  #1 acute COPD exacerbation Patient presented with wheezing, coughing, shortness of breath. Patient with clinical improvement. Chest x-ray negative for any acute infiltrate. Repeat chest x-ray this morning was negative. Place patient on scheduled Xopenex and Atrovent nebulizers. Xopenex and Atrovent when necessary nebs. Brovana, Pulmicort, Claritin, Flonase, PPI. Continue Levaquin and IV steroids. Follow.  #2 dehydration IV fluids.  #3 orthostatic hypotension Likely secondary to poor oral intake. IV fluids. Give a fluid bolus. TED stockings.  #4 hypokalemia Repleted.  #5 tobacco abuse Tobacco cessation. Nicotine patch.  #6 protein calorie malnutrition, moderate  #7 gastroesophageal reflux disease   PPI.  #8 depression/anxiety Continue Xanax, Effexor.  #9 diabetes mellitus Change Lantus to 17 units daily. Sliding scale insulin. Monitor CBGs closely with steroid taper.   DVT prophylaxis: Lovenox  Code Status: Full Family Communication: Updated patient no family at bedside.  Disposition Plan: home when medically stable and on oral medications and no longer orthostatic   Consultants:   None  Procedures:   Chest x-ray 06/16/2015, 06/17/2015  Antimicro .bials:   IV Levaquin 06/16/2015   Subjective: Patient states some improvement of shortness of breath and wheezing since admission. No chest pain. Patient also concerned about  dizziness on standing.  Objective: Filed Vitals:   06/17/15 0940 06/17/15 0945 06/17/15 1020 06/17/15 1304  BP:    145/79  Pulse:    107  Temp:    98.1 F (36.7 C)  TempSrc:    Oral  Resp:    18  Height:      Weight:   70.217 kg (154 lb 12.8 oz)   SpO2: 96% 98% 96% 91%    Intake/Output Summary (Last 24 hours) at 06/17/15 1312 Last data filed at 06/17/15 0800  Gross per 24 hour  Intake   1240 ml  Output    600 ml  Net    640 ml   Filed Weights   06/16/15 2149 06/17/15 1020  Weight: 154.8 kg (341 lb 4.4 oz) 70.217 kg (154 lb 12.8 oz)    Examination:  General exam: Appears calm and comfortable  Respiratory system: Scattered coarse breath sounds. Expiratory wheezing. Cardiovascular system: S1 & S2 heard, RRR. No JVD, murmurs, rubs, gallops or clicks. No pedal edema. Gastrointestinal system: Abdomen is nondistended, soft and nontender. No organomegaly or masses felt. Normal bowel sounds heard. Central nervous system: Alert and oriented. No focal neurological deficits. Extremities: Symmetric 5 x 5 power. Skin: No rashes, lesions or ulcers Psychiatry: Judgement and insight appear normal. Mood & affect appropriate.     Data Reviewed: I have personally reviewed following labs and imaging studies  CBC:  Recent Labs Lab 06/16/15 1612 06/17/15 0815  WBC 10.4 6.9  NEUTROABS 5.7  --   HGB 15.7* 14.5  HCT 47.4* 44.7  MCV 88.1 89.9  PLT 208 0000000   Basic Metabolic Panel:  Recent Labs Lab 06/16/15 1612 06/17/15 0451 06/17/15 0815  NA 138  --  138  K 3.4*  --  3.7  CL 99*  --  105  CO2 27  --  23  GLUCOSE 176*  --  174*  BUN 5*  --  7  CREATININE 0.66  --  0.62  CALCIUM 9.3  --  9.0  MG  --  1.5*  --    GFR: Estimated Creatinine Clearance: 72.6 mL/min (by C-G formula based on Cr of 0.62). Liver Function Tests:  Recent Labs Lab 06/16/15 1612  AST 19  ALT 12*  ALKPHOS 63  BILITOT 0.3  PROT 7.1  ALBUMIN 3.5   No results for input(s): LIPASE, AMYLASE in  the last 168 hours. No results for input(s): AMMONIA in the last 168 hours. Coagulation Profile: No results for input(s): INR, PROTIME in the last 168 hours. Cardiac Enzymes: No results for input(s): CKTOTAL, CKMB, CKMBINDEX, TROPONINI in the last 168 hours. BNP (last 3 results) No results for input(s): PROBNP in the last 8760 hours. HbA1C: No results for input(s): HGBA1C in the last 72 hours. CBG:  Recent Labs Lab 06/16/15 2352 06/17/15 0705 06/17/15 1116  GLUCAP 288* 211* 265*   Lipid Profile: No results for input(s): CHOL, HDL, LDLCALC, TRIG, CHOLHDL, LDLDIRECT in the last 72 hours. Thyroid Function Tests:  Recent Labs  06/16/15 1612  TSH 0.832   Anemia Panel: No results for input(s): VITAMINB12, FOLATE, FERRITIN, TIBC, IRON, RETICCTPCT in the last 72 hours. Urine analysis:    Component Value Date/Time   COLORURINE YELLOW 06/16/2015 1846   APPEARANCEUR CLOUDY* 06/16/2015 1846   LABSPEC 1.009 06/16/2015 1846   PHURINE 6.5 06/16/2015 1846   GLUCOSEU NEGATIVE 06/16/2015 1846   HGBUR NEGATIVE 06/16/2015 1846   BILIRUBINUR NEGATIVE 06/16/2015 1846   KETONESUR NEGATIVE 06/16/2015 1846   PROTEINUR NEGATIVE 06/16/2015 1846   UROBILINOGEN 0.2 01/03/2014 1819   NITRITE NEGATIVE 06/16/2015 1846   LEUKOCYTESUR TRACE* 06/16/2015 1846   Sepsis Labs: @LABRCNTIP (procalcitonin:4,lacticidven:4)  ) Recent Results (from the past 240 hour(s))  Culture, blood (routine x 2) Call MD if unable to obtain prior to antibiotics being given     Status: None (Preliminary result)   Collection Time: 06/16/15  9:52 PM  Result Value Ref Range Status   Specimen Description BLOOD RIGHT ANTECUBITAL  Final   Special Requests BOTTLES DRAWN AEROBIC AND ANAEROBIC 5CC EA  Final   Culture PENDING  Incomplete   Report Status PENDING  Incomplete         Radiology Studies: Dg Chest 2 View  06/17/2015  CLINICAL DATA:  Cough EXAM: CHEST  2 VIEW COMPARISON:  06/16/2015 FINDINGS: Lungs are clear.   No pleural effusion or pneumothorax. The heart is normal in size. Degenerative changes of the visualized thoracolumbar spine. Cervical spine fixation hardware. IMPRESSION: No evidence of acute cardiopulmonary disease. Electronically Signed   By: Julian Hy M.D.   On: 06/17/2015 09:15   Dg Chest 2 View  06/16/2015  CLINICAL DATA:  Cough, congestion for 1 week EXAM: CHEST  2 VIEW COMPARISON:  06/04/2015 FINDINGS: Cardiomediastinal silhouette is stable. No acute infiltrate or pleural effusion. No pulmonary edema. Mild degenerative changes mid thoracic spine. IMPRESSION: No active cardiopulmonary disease. Electronically Signed   By: Lahoma Crocker M.D.   On: 06/16/2015 15:35        Scheduled Meds: . alprazolam  1 mg Oral TID WC  . ALPRAZolam  2 mg Oral QHS  . arformoterol  15 mcg Nebulization BID  . Brexpiprazole  4 mg Oral q morning - 10a  . budesonide (PULMICORT) nebulizer solution  0.25  mg Nebulization BID  . dextromethorphan-guaiFENesin  2 tablet Oral BID  . enoxaparin (LOVENOX) injection  40 mg Subcutaneous QHS  . feeding supplement (ENSURE ENLIVE)  237 mL Oral TID BM  . fluticasone  2 spray Each Nare Daily  . insulin aspart  0-9 Units Subcutaneous TID WC  . insulin glargine  7 Units Subcutaneous Daily  . ipratropium  0.5 mg Nebulization Q6H  . levalbuterol  0.63 mg Nebulization Q6H  . levofloxacin  750 mg Intravenous Q24H  . loratadine  10 mg Oral Daily  . magnesium oxide  400 mg Oral BID  . methylPREDNISolone (SOLU-MEDROL) injection  60 mg Intravenous TID  . nicotine  21 mg Transdermal Daily  . nortriptyline  75 mg Oral QHS  . sodium chloride  1,000 mL Intravenous Once  . venlafaxine XR  150 mg Oral q morning - 10a  . Vitamin D (Ergocalciferol)  50,000 Units Oral Q7 days   Continuous Infusions: . sodium chloride 100 mL/hr (06/16/15 2250)        Time spent: 35 minutes    Makeisha Jentsch, MD [facility] Pager [phone]  If 7PM-7AM, please contact  night-coverage [email or web address] Password [email or web address] 06/17/2015, 1:12 PM

## 2015-06-17 NOTE — Progress Notes (Signed)
Inpatient Diabetes Program Recommendations  AACE/ADA: New Consensus Statement on Inpatient Glycemic Control (2015)  Target Ranges:  Prepandial:   less than 140 mg/dL      Peak postprandial:   less than 180 mg/dL (1-2 hours)      Critically ill patients:  140 - 180 mg/dL   Review of Glycemic Control:  Results for [name], [name] (MRN [id number]) as of 06/17/2015 11:00  Ref. Range 06/16/2015 23:52 06/17/2015 07:05  Glucose-Capillary Latest Ref Range: 65-99 mg/dL 288 (H) 211 (H)   Diabetes history: Diabetes Mellitus Outpatient Diabetes medications: Glucotrol 10 mg with breakfast, Lantus 14 units daily, Metformin 500 mg bid Current orders for Inpatient glycemic control:  Novolog sensitive tid with meals, Solumedrol 60 mg IV tid Inpatient Diabetes Program Recommendations:    Please change Lantus to 7 units daily (instead of PRN).  Thanks, Adah Perl, RN, BC-ADM Inpatient Diabetes Coordinator Pager [phone] (8a-5p)

## 2015-06-17 NOTE — Evaluation (Signed)
Physical Therapy Evaluation Patient Details Name: [name] MRN: [id number] DOB: Aug 14, 1955 Today's Date: 06/17/2015   History of Present Illness  [name] is a 60 y.o. female with medical history significant of tobacco abuse, COPD, asthma, GERD, depression, anxiety, hypertension, ADHD, hyperlipidemia, diabetes mellitus, chronic abdominal wound, who presented with productive cough and shortness of breath, and generalized weakness. Pt reports she is falling at home due to "blacking out."  Clinical Impression  Pt admitted with above diagnosis and presents to PT with functional limitations due to deficits listed below (See PT problem list). Pt needs skilled PT to maximize independence and safety to allow discharge to home with family. Pt reports all of her falls at home are when she "blacks out." Orthostatic BP's taken and in Vitals in flowsheets.     Follow Up Recommendations No PT follow up;Supervision for mobility/OOB    Equipment Recommendations  None recommended by PT    Recommendations for Other Services       Precautions / Restrictions Precautions Precautions: Fall Precaution Comments: orthostatic - watch BP Restrictions Weight Bearing Restrictions: No      Mobility  Bed Mobility Overal bed mobility: Needs Assistance Bed Mobility: Supine to Sit     Supine to sit: Supervision     General bed mobility comments: Pt required assistance to move linens out of the way to safely sit EOB.   Transfers Overall transfer level: Needs assistance Equipment used: None Transfers: Sit to/from Stand Sit to Stand: Supervision         General transfer comment: for safety  Ambulation/Gait Ambulation/Gait assistance: Min guard Ambulation Distance (Feet): 170 Feet Assistive device: None Gait Pattern/deviations: Step-through pattern;Decreased stride length Gait velocity: decr Gait velocity interpretation: Below normal speed for age/gender General Gait Details: Assist  for safety due to history of syncope. Gait guarded but no unsteadiness. SpO2 on RA >92%  Stairs            Wheelchair Mobility    Modified Rankin (Stroke Patients Only)       Balance Overall balance assessment: No apparent balance deficits (not formally assessed)                                           Pertinent Vitals/Pain Pain Assessment: 0-10 Pain Score: 5  Pain Location: back and neck - chronic Pain Descriptors / Indicators: Constant Pain Intervention(s): Limited activity within patient's tolerance;Monitored during session    Home Living Family/patient expects to be discharged to:: Private residence Living Arrangements: Other relatives Available Help at Discharge: Family Type of Home: Mobile home Home Access: Stairs to enter   Technical brewer of Steps: 2 Home Layout: One level Home Equipment: Shower seat      Prior Function Level of Independence: Needs assistance   Gait / Transfers Assistance Needed: Independent but falls in last week due to "blacking out"  ADL's / Homemaking Assistance Needed: grandaughter does most housework, cooking        Journalist, newspaper   Dominant Hand: Right    Extremity/Trunk Assessment   Upper Extremity Assessment: Defer to OT evaluation           Lower Extremity Assessment: Generalized weakness      Cervical / Trunk Assessment: Kyphotic  Communication   Communication: No difficulties  Cognition Arousal/Alertness: Awake/alert Behavior During Therapy: Flat affect Overall Cognitive Status: Within Functional Limits for tasks assessed  General Comments      Exercises        Assessment/Plan    PT Assessment Patient needs continued PT services  PT Diagnosis Generalized weakness   PT Problem List Decreased strength;Decreased activity tolerance;Decreased mobility  PT Treatment Interventions Gait training;Functional mobility training;Therapeutic  activities;Therapeutic exercise;Patient/family education   PT Goals (Current goals can be found in the Care Plan section) Acute Rehab PT Goals Patient Stated Goal: figure out why I'm falling PT Goal Formulation: With patient Time For Goal Achievement: 06/24/15 Potential to Achieve Goals: Good    Frequency Min 3X/week   Barriers to discharge        Co-evaluation PT/OT/SLP Co-Evaluation/Treatment: Yes Reason for Co-Treatment: For patient/therapist safety PT goals addressed during session: Mobility/safety with mobility OT goals addressed during session: ADL's and self-care;Strengthening/ROM (functional mobility)       End of Session Equipment Utilized During Treatment: Gait belt Activity Tolerance: Patient tolerated treatment well Patient left: in chair;with call bell/phone within reach;with chair alarm set Nurse Communication: Mobility status (Nurse tech)    Functional Assessment Tool Used: clinical judgement Functional Limitation: Mobility: Walking and moving around Mobility: Walking and Moving Around Current Status [phone]): At least 1 percent but less than 20 percent impaired, limited or restricted Mobility: Walking and Moving Around Goal Status [phone]): 0 percent impaired, limited or restricted    Time: 1016-1035 PT Time Calculation (min) (ACUTE ONLY): 19 min   Charges:   PT Evaluation $PT Eval Moderate Complexity: 1 Procedure     PT G Codes:   PT G-Codes **NOT FOR INPATIENT CLASS** Functional Assessment Tool Used: clinical judgement Functional Limitation: Mobility: Walking and moving around Mobility: Walking and Moving Around Current Status [id number]): At least 1 percent but less than 20 percent impaired, limited or restricted Mobility: Walking and Moving Around Goal Status [phone]): 0 percent impaired, limited or restricted    [name] 06/17/2015, 1:20 PM [facility] PT [phone]

## 2015-06-18 LAB — BASIC METABOLIC PANEL
Anion gap: 11 (ref 5–15)
BUN: 14 mg/dL (ref 6–20)
CHLORIDE: 102 mmol/L (ref 101–111)
CO2: 25 mmol/L (ref 22–32)
CREATININE: 0.81 mg/dL (ref 0.44–1.00)
Calcium: 8.6 mg/dL — ABNORMAL LOW (ref 8.9–10.3)
GFR calc Af Amer: >60 mL/min (ref >60)
GFR calc non Af Amer: >60 mL/min (ref >60)
GLUCOSE: 261 mg/dL — AB (ref 65–99)
Potassium: 4.1 mmol/L (ref 3.5–5.1)
SODIUM: 138 mmol/L (ref 135–145)

## 2015-06-18 LAB — CBC
HCT: 42.7 % (ref 36.0–46.0)
Hemoglobin: 13.4 g/dL (ref 12.0–15.0)
MCH: 28.6 pg (ref 26.0–34.0)
MCHC: 31.4 g/dL (ref 30.0–36.0)
MCV: 91 fL (ref 78.0–100.0)
PLATELETS: 173 10*3/uL (ref 150–400)
RBC: 4.69 MIL/uL (ref 3.87–5.11)
RDW: 13 % (ref 11.5–15.5)
WBC: 10.1 10*3/uL (ref 4.0–10.5)

## 2015-06-18 LAB — GLUCOSE, CAPILLARY
Glucose-Capillary: 254 mg/dL — ABNORMAL HIGH (ref 65–99)
Glucose-Capillary: 198 mg/dL — ABNORMAL HIGH (ref 65–99)
Glucose-Capillary: 263 mg/dL — ABNORMAL HIGH (ref 65–99)
Glucose-Capillary: 173 mg/dL — ABNORMAL HIGH (ref 65–99)

## 2015-06-18 LAB — MAGNESIUM: Magnesium: 1.9 mg/dL (ref 1.7–2.4)

## 2015-06-18 MED ORDER — LEVALBUTEROL HCL 0.63 MG/3ML IN NEBU
0.6300 mg | INHALATION_SOLUTION | Freq: Three times a day (TID) | RESPIRATORY_TRACT | Status: DC
  Administered 2015-06-18 – 2015-06-19 (×4): 0.63 mg via RESPIRATORY_TRACT
  Filled 2015-06-18 (×4): qty 3

## 2015-06-18 MED ORDER — IPRATROPIUM BROMIDE 0.02 % IN SOLN
0.5000 mg | Freq: Three times a day (TID) | RESPIRATORY_TRACT | Status: DC
  Administered 2015-06-18 – 2015-06-19 (×4): 0.5 mg via RESPIRATORY_TRACT
  Filled 2015-06-18 (×4): qty 2.5

## 2015-06-18 MED ORDER — METHYLPREDNISOLONE SODIUM SUCC 125 MG IJ SOLR
60.0000 mg | Freq: Two times a day (BID) | INTRAMUSCULAR | Status: DC
Start: 2015-06-18 — End: 2015-06-19
  Administered 2015-06-18 (×2): 60 mg via INTRAVENOUS
  Filled 2015-06-18 (×2): qty 2

## 2015-06-18 MED ORDER — LEVOFLOXACIN 750 MG PO TABS
750.0000 mg | ORAL_TABLET | Freq: Every day | ORAL | Status: DC
  Administered 2015-06-18: 750 mg via ORAL
  Filled 2015-06-18: qty 1

## 2015-06-18 NOTE — Progress Notes (Signed)
Ambulated 150 feet with Pt without oxygen.  Pt 95% on RA while sitting.  During ambulation Pt oxygen level dropped to 88% after ambulating 100 feet.  Pt denies dizziness during ambulation.  Pt oxygen saturation returned to 94% when Pt returned to room and sat on bed.  Pt tol well.

## 2015-06-18 NOTE — Progress Notes (Signed)
PHARMACIST - PHYSICIAN COMMUNICATION DR:   TRH CONCERNING: Antibiotic IV to Oral Route Change Policy  RECOMMENDATION: This patient is receiving Levaquin by the intravenous route.  Based on criteria approved by the Pharmacy and Therapeutics Committee, the antibiotic(s) is/are being converted to the equivalent oral dose form(s).   DESCRIPTION: These criteria include:  Patient being treated for a respiratory tract infection, urinary tract infection, cellulitis or clostridium difficile associated diarrhea if on metronidazole  The patient is not neutropenic and does not exhibit a GI malabsorption state  The patient is eating (either orally or via tube) and/or has been taking other orally administered medications for a least 24 hours  The patient is improving clinically and has a Tmax < 100.5  If you have questions about this conversion, please contact the Pharmacy Department  []   [phone] )  [facility] []   [phone] )  [facility] [x]   [phone] )  [facility] []   [phone] )  [facility] []   [phone] )  [facility]

## 2015-06-18 NOTE — Progress Notes (Signed)
Inpatient Diabetes Program Recommendations  AACE/ADA: New Consensus Statement on Inpatient Glycemic Control (2015)  Target Ranges:  Prepandial:   less than 140 mg/dL      Peak postprandial:   less than 180 mg/dL (1-2 hours)      Critically ill patients:  140 - 180 mg/dL   Results for MAYSEN, BLACKWELDER (MRN [id number]) as of 06/18/2015 11:49  Ref. Range 06/17/2015 11:16 06/17/2015 16:42 06/17/2015 21:33 06/18/2015 06:03 06/18/2015 11:26  Glucose-Capillary Latest Ref Range: 65-99 mg/dL 265 (H) 265 (H) 273 (H) 254 (H) 198 (H)   Review of Glycemic Control  Inpatient Diabetes Program Recommendations:   Glucose still elevated. Patient is on IV Solumedrol 60 mg Q12hrs. Please consider increasing correction to Novolog Moderate (0-15 units) TID + HS scale.  Thanks,  Tama Headings RN, MSN, [facility] Inpatient Diabetes Coordinator Team Pager [phone] (8a-5p)

## 2015-06-18 NOTE — Progress Notes (Signed)
PROGRESS NOTE    [name]  [id number] DOB: 04-Oct-1955 DOA: 06/16/2015 PCP: Barbette Merino, MD  Outpatient Specialists:   Assessment & Plan:   Principal Problem:   COPD exacerbation ([place]) Active Problems:   Orthostatic hypotension   GERD (gastroesophageal reflux disease)   Anxiety   Depression   Hypertension   Diabetes mellitus without complication (HCC)   ADHD (attention deficit hyperactivity disorder)   Hypokalemia   Dehydration   Accelerated hypertension   Tobacco abuse   Protein-calorie malnutrition, moderate (HCC)  #1 acute COPD exacerbation Patient presented with wheezing, coughing, shortness of breath. Patient with clinical improvement. Chest x-ray negative for any acute infiltrate. Repeat chest x-ray negative. Continue scheduled Xopenex and Atrovent nebulizers. Xopenex and Atrovent when necessary nebs. Brovana, Pulmicort, Claritin, Flonase, PPI. Change IV Levaquin to oral levaquin. Taper IV steroids. Follow.  #2 dehydration IV fluids.  #3 orthostatic hypotension Likely secondary to poor oral intake. IV fluids.  TED stockings.  #4 hypokalemia Repleted.  #5 tobacco abuse Tobacco cessation. Nicotine patch.  #6 protein calorie malnutrition, moderate  #7 gastroesophageal reflux disease   PPI.  #8 depression/anxiety Continue Xanax, Effexor.  #9 diabetes mellitus Continue Lantus to 17 units daily. Sliding scale insulin. Monitor CBGs closely with steroid taper.   DVT prophylaxis: Lovenox  Code Status: Full Family Communication: Updated patient no family at bedside.  Disposition Plan: home when medically stable and on oral medications and no longer orthostatic   Consultants:   None  Procedures:   Chest x-ray 06/16/2015, 06/17/2015  Antimicro .bials:   IV Levaquin 06/16/2015>>>oral levaquin 06/18/2015   Subjective: Patient states SOB and wheezing improving. No CP. Patient denies any dizziness, but hasn't been ambulating  much.  Objective: Filed Vitals:   06/17/15 2001 06/17/15 2140 06/18/15 0346 06/18/15 0951  BP:  144/70 146/80   Pulse:  107 100   Temp:  98.2 F (36.8 C) 98.1 F (36.7 C)   TempSrc:  Oral Oral   Resp:  18 16   Height:      Weight:      SpO2: 89% 97% 95% 95%    Intake/Output Summary (Last 24 hours) at 06/18/15 1118 Last data filed at 06/18/15 0800  Gross per 24 hour  Intake    480 ml  Output   1100 ml  Net   -620 ml   Filed Weights   06/16/15 2149 06/17/15 1020  Weight: 154.8 kg (341 lb 4.4 oz) 70.217 kg (154 lb 12.8 oz)    Examination:  General exam: Appears calm and comfortable  Respiratory system: Scattered coarse breath sounds. Less Expiratory wheezing. Cardiovascular system: S1 & S2 heard, RRR. No JVD, murmurs, rubs, gallops or clicks. No pedal edema. Gastrointestinal system: Abdomen is nondistended, soft and nontender. No organomegaly or masses felt. Normal bowel sounds heard. Central nervous system: Alert and oriented. No focal neurological deficits. Extremities: Symmetric 5 x 5 power. Skin: No rashes, lesions or ulcers Psychiatry: Judgement and insight appear normal. Mood & affect appropriate.     Data Reviewed: I have personally reviewed following labs and imaging studies  CBC:  Recent Labs Lab 06/16/15 1612 06/17/15 0815 06/18/15 0300  WBC 10.4 6.9 10.1  NEUTROABS 5.7  --   --   HGB 15.7* 14.5 13.4  HCT 47.4* 44.7 42.7  MCV 88.1 89.9 91.0  PLT 208 175 [id number]   Basic Metabolic Panel:  Recent Labs Lab 06/16/15 1612 06/17/15 0451 06/17/15 0815 06/18/15 0300  NA 138  --  138 138  K  3.4*  --  3.7 4.1  CL 99*  --  105 102  CO2 27  --  23 25  GLUCOSE 176*  --  174* 261*  BUN 5*  --  7 14  CREATININE 0.66  --  0.62 0.81  CALCIUM 9.3  --  9.0 8.6*  MG  --  1.5*  --  1.9   GFR: Estimated Creatinine Clearance: 71.7 mL/min (by C-G formula based on Cr of 0.81). Liver Function Tests:  Recent Labs Lab 06/16/15 1612  AST 19  ALT 12*  ALKPHOS  63  BILITOT 0.3  PROT 7.1  ALBUMIN 3.5   No results for input(s): LIPASE, AMYLASE in the last 168 hours. No results for input(s): AMMONIA in the last 168 hours. Coagulation Profile: No results for input(s): INR, PROTIME in the last 168 hours. Cardiac Enzymes: No results for input(s): CKTOTAL, CKMB, CKMBINDEX, TROPONINI in the last 168 hours. BNP (last 3 results) No results for input(s): PROBNP in the last 8760 hours. HbA1C: No results for input(s): HGBA1C in the last 72 hours. CBG:  Recent Labs Lab 06/17/15 0705 06/17/15 1116 06/17/15 1642 06/17/15 2133 06/18/15 0603  GLUCAP 211* 265* 265* 273* 254*   Lipid Profile: No results for input(s): CHOL, HDL, LDLCALC, TRIG, CHOLHDL, LDLDIRECT in the last 72 hours. Thyroid Function Tests:  Recent Labs  06/16/15 1612  TSH 0.832   Anemia Panel: No results for input(s): VITAMINB12, FOLATE, FERRITIN, TIBC, IRON, RETICCTPCT in the last 72 hours. Urine analysis:    Component Value Date/Time   COLORURINE YELLOW 06/16/2015 1846   APPEARANCEUR CLOUDY* 06/16/2015 1846   LABSPEC 1.009 06/16/2015 1846   PHURINE 6.5 06/16/2015 1846   GLUCOSEU NEGATIVE 06/16/2015 1846   HGBUR NEGATIVE 06/16/2015 1846   BILIRUBINUR NEGATIVE 06/16/2015 1846   KETONESUR NEGATIVE 06/16/2015 1846   PROTEINUR NEGATIVE 06/16/2015 1846   UROBILINOGEN 0.2 01/03/2014 1819   NITRITE NEGATIVE 06/16/2015 1846   LEUKOCYTESUR TRACE* 06/16/2015 1846   Sepsis Labs: @LABRCNTIP (procalcitonin:4,lacticidven:4)  ) Recent Results (from the past 240 hour(s))  Culture, blood (routine x 2) Call MD if unable to obtain prior to antibiotics being given     Status: None (Preliminary result)   Collection Time: 06/16/15  9:52 PM  Result Value Ref Range Status   Specimen Description BLOOD RIGHT ANTECUBITAL  Final   Special Requests BOTTLES DRAWN AEROBIC AND ANAEROBIC 5CC  Final   Culture NO GROWTH < 24 HOURS  Final   Report Status PENDING  Incomplete  Culture, blood  (routine x 2) Call MD if unable to obtain prior to antibiotics being given     Status: None (Preliminary result)   Collection Time: 06/16/15 10:00 PM  Result Value Ref Range Status   Specimen Description BLOOD RIGHT HAND  Final   Special Requests BOTTLES DRAWN AEROBIC AND ANAEROBIC 5CC   Final   Culture NO GROWTH < 24 HOURS  Final   Report Status PENDING  Incomplete         Radiology Studies: Dg Chest 2 View  06/17/2015  CLINICAL DATA:  Cough EXAM: CHEST  2 VIEW COMPARISON:  06/16/2015 FINDINGS: Lungs are clear.  No pleural effusion or pneumothorax. The heart is normal in size. Degenerative changes of the visualized thoracolumbar spine. Cervical spine fixation hardware. IMPRESSION: No evidence of acute cardiopulmonary disease. Electronically Signed   By: Julian Hy M.D.   On: 06/17/2015 09:15   Dg Chest 2 View  06/16/2015  CLINICAL DATA:  Cough, congestion for 1 week EXAM:  CHEST  2 VIEW COMPARISON:  06/04/2015 FINDINGS: Cardiomediastinal silhouette is stable. No acute infiltrate or pleural effusion. No pulmonary edema. Mild degenerative changes mid thoracic spine. IMPRESSION: No active cardiopulmonary disease. Electronically Signed   By: Lahoma Crocker M.D.   On: 06/16/2015 15:35        Scheduled Meds: . alprazolam  1 mg Oral TID WC  . ALPRAZolam  2 mg Oral QHS  . antiseptic oral rinse  7 mL Mouth Rinse BID  . arformoterol  15 mcg Nebulization BID  . Brexpiprazole  4 mg Oral q morning - 10a  . budesonide (PULMICORT) nebulizer solution  0.25 mg Nebulization BID  . dextromethorphan-guaiFENesin  2 tablet Oral BID  . enoxaparin (LOVENOX) injection  40 mg Subcutaneous QHS  . feeding supplement (ENSURE ENLIVE)  237 mL Oral TID BM  . fluticasone  2 spray Each Nare Daily  . insulin aspart  0-9 Units Subcutaneous TID WC  . insulin glargine  7 Units Subcutaneous Daily  . ipratropium  0.5 mg Nebulization TID  . levalbuterol  0.63 mg Nebulization TID  . levofloxacin  750 mg Oral q1800   . loratadine  10 mg Oral Daily  . magnesium oxide  400 mg Oral BID  . methylPREDNISolone (SOLU-MEDROL) injection  60 mg Intravenous Q12H  . nicotine  21 mg Transdermal Daily  . nortriptyline  75 mg Oral QHS  . venlafaxine XR  150 mg Oral q morning - 10a  . Vitamin D (Ergocalciferol)  50,000 Units Oral Q7 days   Continuous Infusions: . sodium chloride 125 mL/hr at 06/18/15 0723     LOS: 1 day    Time spent: 71 minutes    Charlies Rayburn, MD [facility] Pager [phone]  If 7PM-7AM, please contact night-coverage [email or web address] Password [email or web address] 06/18/2015, 11:18 AM

## 2015-06-18 NOTE — Progress Notes (Signed)
Patient resting in bed, no needs at this time. Call light within reach.

## 2015-06-18 NOTE — Progress Notes (Signed)
Occupational Therapy Treatment Patient Details Name: [name] MRN: [id number] DOB: 01-21-1956 Today's Date: 06/18/2015    History of present illness [name] is a 60 y.o. female with medical history significant of tobacco abuse, COPD, asthma, GERD, depression, anxiety, hypertension, ADHD, hyperlipidemia, diabetes mellitus, chronic abdominal wound, who presented with productive cough and shortness of breath, and generalized weakness. Pt reports she is falling at home due to "blacking out."   OT comments  Patient making good progress towards OT goals, continue plan of care for now. See ADL comment for information regarding this session. As stated below, BP at end of session=165/86. HR and 02 on RA WNL entire session.    Follow Up Recommendations  No OT follow up;Supervision/Assistance - 24 hour (24/7 due to multiple falls prior to this admission)    Equipment Recommendations  3 in 1 bedside comode    Recommendations for Other Services  None at this time   Precautions / Restrictions Precautions Precautions: Fall Precaution Comments: orthostatic - watch BP Restrictions Weight Bearing Restrictions: No    Mobility Bed Mobility Overal bed mobility: Needs Assistance Bed Mobility: Supine to Sit     Supine to sit: Supervision     General bed mobility comments: supervision for safety  Transfers Overall transfer level: Needs assistance Equipment used: None Transfers: Sit to/from Stand Sit to Stand: Supervision General transfer comment: for safety    Balance Overall balance assessment: No apparent balance deficits (not formally assessed);History of Falls (history of falls likely due to orthostatic hypotension)   ADL Overall ADL's : Needs assistance/impaired Eating/Feeding: Set up;Sitting   Grooming: Set up;Supervision/safety;Standing;Wash/dry face;Oral care;Wash/dry hands   Upper Body Bathing: Set up;Sitting   Lower Body Bathing: Supervison/ safety;Sit to/from  stand   Upper Body Dressing : Set up;Sitting   Lower Body Dressing: Supervision/safety;Sit to/from stand   Toilet Transfer: Media planner Details (indicate cue type and reason): simulated  Toileting- Clothing Manipulation and Hygiene: Engineer, site Details (indicate cue type and reason): did not occur, but discussed importance of sitting for showrs and granddaughter being present    General ADL Comments: Pt overall supervision today. Pt with minimal complaints of dizziness during transitional movements, but quickly subsided. BP at end of session=165/86. HR and 02 on RA WNL entire session.            Cognition   Behavior During Therapy: Flat affect Overall Cognitive Status: Within Functional Limits for tasks assessed                 Pertinent Vitals/ Pain       Pain Assessment: No/denies pain   Frequency Min 2X/week     Progress Toward Goals  OT Goals(current goals can now befound in the care plan section)  Progress towards OT goals: Progressing toward goals  Acute Rehab OT Goals Patient Stated Goal: figure out why I'm falling OT Goal Formulation: With patient Time For Goal Achievement: 06/24/15 Potential to Achieve Goals: Good  Plan Discharge plan remains appropriate    Activity Tolerance Patient tolerated treatment well  Patient Left in chair;with call bell/phone within reach;with chair alarm set  Nurse Communication Mobility status;Other (comment) (Pt's BP at end of session (165/86) and patient left on RA at end of session)     Time: 1206-1219 OT Time Calculation (min): 13 min  Charges: OT General Charges $OT Visit: 1 Procedure OT Treatments $Self Care/Home Management : 8-22 mins  Chrys Racer , MS, OTR/L, CLT Pager:  [phone]  06/18/2015, 12:33 PM

## 2015-06-18 NOTE — Progress Notes (Signed)
Physical Therapy Treatment Patient Details Name: [name] MRN: [id number] DOB: 10/02/1955 Today's Date: 06/18/2015    History of Present Illness [name] is a 60 y.o. female with medical history significant of tobacco abuse, COPD, asthma, GERD, depression, anxiety, hypertension, ADHD, hyperlipidemia, diabetes mellitus, chronic abdominal wound, who presented with productive cough and shortness of breath, and generalized weakness. Dx of COPD exacerbation. Pt reports she is falling at home due to "blacking out."    PT Comments    Pt ambulated 150' without an assistive device, no loss of balance, however SaO2 dropped to 86% on RA while walking, HR 116, 1/4 dyspnea. Will monitor for potential supplemental O2 needs.   Follow Up Recommendations  No PT follow up;Supervision for mobility/OOB     Equipment Recommendations  None recommended by PT    Recommendations for Other Services       Precautions / Restrictions Precautions Precautions: Fall Precaution Comments: orthostatic - watch BP, monitor O2 Restrictions Weight Bearing Restrictions: No    Mobility  Bed Mobility Overal bed mobility: Needs Assistance Bed Mobility: Supine to Sit;Sit to Supine     Supine to sit: Modified independent (Device/Increase time);HOB elevated Sit to supine: Modified independent (Device/Increase time);HOB elevated   General bed mobility comments: no physical assist  Transfers Overall transfer level: Needs assistance Equipment used: None Transfers: Sit to/from Stand Sit to Stand: Supervision         General transfer comment: for safety  Ambulation/Gait Ambulation/Gait assistance: Min guard Ambulation Distance (Feet): 150 Feet Assistive device: None Gait Pattern/deviations: Decreased stride length Gait velocity: decr Gait velocity interpretation: Below normal speed for age/gender General Gait Details: Assist for safety due to history of syncope. Gait guarded but no  unsteadiness. SpO2 on RA 86% with ambulation, 93% on RA at rest prior to walking.   Stairs            Wheelchair Mobility    Modified Rankin (Stroke Patients Only)       Balance Overall balance assessment: History of Falls                                  Cognition Arousal/Alertness: Awake/alert Behavior During Therapy: Flat affect Overall Cognitive Status: Within Functional Limits for tasks assessed                      Exercises      General Comments        Pertinent Vitals/Pain Pain Assessment: No/denies pain    Home Living                      Prior Function            PT Goals (current goals can now be found in the care plan section) Acute Rehab PT Goals Patient Stated Goal: figure out why I'm falling PT Goal Formulation: With patient Time For Goal Achievement: 06/24/15 Potential to Achieve Goals: Good Progress towards PT goals: Progressing toward goals    Frequency  Min 3X/week    PT Plan Current plan remains appropriate    Co-evaluation             End of Session Equipment Utilized During Treatment: Gait belt Activity Tolerance: Patient limited by fatigue Patient left: with call bell/phone within reach;in bed;with bed alarm set     Time: [id number] PT Time Calculation (min) (ACUTE ONLY): 12 min  Charges:  $Gait Training: 8-22 mins                    G Codes:      Philomena Doheny 06/18/2015, 1:44 PM [phone]

## 2015-06-19 LAB — GLUCOSE, CAPILLARY
GLUCOSE-CAPILLARY: 234 mg/dL — AB (ref 65–99)
Glucose-Capillary: 262 mg/dL — ABNORMAL HIGH (ref 65–99)

## 2015-06-19 LAB — BASIC METABOLIC PANEL
Anion gap: 10 (ref 5–15)
BUN: 17 mg/dL (ref 6–20)
CO2: 22 mmol/L (ref 22–32)
Calcium: 8.4 mg/dL — ABNORMAL LOW (ref 8.9–10.3)
Chloride: 105 mmol/L (ref 101–111)
Creatinine, Ser: 0.72 mg/dL (ref 0.44–1.00)
GFR calc Af Amer: >60 mL/min (ref >60)
GFR calc non Af Amer: >60 mL/min (ref >60)
Glucose, Bld: 268 mg/dL — ABNORMAL HIGH (ref 65–99)
Potassium: 4 mmol/L (ref 3.5–5.1)
Sodium: 137 mmol/L (ref 135–145)

## 2015-06-19 LAB — CBC
HCT: 41.1 % (ref 36.0–46.0)
Hemoglobin: 13.2 g/dL (ref 12.0–15.0)
MCH: 29.7 pg (ref 26.0–34.0)
MCHC: 32.1 g/dL (ref 30.0–36.0)
MCV: 92.6 fL (ref 78.0–100.0)
Platelets: 158 10*3/uL (ref 150–400)
RBC: 4.44 MIL/uL (ref 3.87–5.11)
RDW: 13.2 % (ref 11.5–15.5)
WBC: 9.2 10*3/uL (ref 4.0–10.5)

## 2015-06-19 MED ORDER — LEVOFLOXACIN 500 MG PO TABS
500.0000 mg | ORAL_TABLET | Freq: Every day | ORAL | Status: DC
Start: 2015-06-19 — End: 2016-08-09

## 2015-06-19 MED ORDER — PREDNISONE 20 MG PO TABS: 20.0000 mg | ORAL_TABLET | Freq: Every day | ORAL | Status: DC

## 2015-06-19 MED ORDER — ALBUTEROL SULFATE (2.5 MG/3ML) 0.083% IN NEBU: 2.5000 mg | INHALATION_SOLUTION | RESPIRATORY_TRACT | Status: DC | PRN

## 2015-06-19 MED ORDER — BUDESONIDE-FORMOTEROL FUMARATE 160-4.5 MCG/ACT IN AERO: 2.0000 | INHALATION_SPRAY | Freq: Two times a day (BID) | RESPIRATORY_TRACT | Status: DC

## 2015-06-19 MED ORDER — DM-GUAIFENESIN ER 30-600 MG PO TB12
2.0000 | ORAL_TABLET | Freq: Two times a day (BID) | ORAL | Status: DC
Start: 2015-06-19 — End: 2017-04-05

## 2015-06-19 MED ORDER — METHYLPREDNISOLONE SODIUM SUCC 125 MG IJ SOLR
60.0000 mg | Freq: Every day | INTRAMUSCULAR | Status: DC
  Administered 2015-06-19: 60 mg via INTRAVENOUS
  Filled 2015-06-19: qty 2

## 2015-06-19 MED ORDER — LORATADINE 10 MG PO TABS: 10.0000 mg | ORAL_TABLET | Freq: Every day | ORAL | Status: DC

## 2015-06-19 MED ORDER — NORTRIPTYLINE HCL 25 MG PO CAPS: 50.0000 mg | ORAL_CAPSULE | Freq: Every day | ORAL | Status: DC

## 2015-06-19 MED ORDER — FLUTICASONE PROPIONATE 50 MCG/ACT NA SUSP: 2.0000 | Freq: Every day | NASAL | Status: DC

## 2015-06-19 MED ORDER — LEVALBUTEROL HCL 0.63 MG/3ML IN NEBU: 0.6300 mg | INHALATION_SOLUTION | Freq: Four times a day (QID) | RESPIRATORY_TRACT | Status: DC | PRN

## 2015-06-19 MED ORDER — NORTRIPTYLINE HCL 25 MG PO CAPS
50.0000 mg | ORAL_CAPSULE | Freq: Every day | ORAL | Status: DC
  Filled 2015-06-19: qty 2

## 2015-06-19 MED ORDER — TIOTROPIUM BROMIDE MONOHYDRATE 18 MCG IN CAPS: 18.0000 ug | ORAL_CAPSULE | Freq: Every day | RESPIRATORY_TRACT | Status: DC

## 2015-06-19 MED ORDER — METOPROLOL TARTRATE 12.5 MG HALF TABLET
12.5000 mg | ORAL_TABLET | Freq: Two times a day (BID) | ORAL | Status: DC
  Administered 2015-06-19: 12.5 mg via ORAL
  Filled 2015-06-19: qty 1

## 2015-06-19 MED ORDER — METOPROLOL TARTRATE 25 MG PO TABS
12.5000 mg | ORAL_TABLET | Freq: Two times a day (BID) | ORAL | Status: DC
Start: 2015-06-19 — End: 2017-01-27

## 2015-06-19 NOTE — Progress Notes (Signed)
Cm spoke to patient regarding discharge plan and patient said that she has no DME needs as she already has a 3N1 and a walker. CM spoke to patient about the frequent falls and she said she plans to start using her walker all the time and that her 61 year old granddaughter will be there to assist 24/7. Cm requested to speak to provider about possible [facility] RN or aide and patient declined and said she thought she was fine. No further CM needs communicated and patient planning for discharge home today.

## 2015-06-19 NOTE — Discharge Summary (Signed)
Physician Discharge Summary  [name] [id number] DOB: 04/24/1955 DOA: 06/16/2015  PCP: Barbette Merino, MD  Admit date: 06/16/2015 Discharge date: 06/19/2015  Time spent: 65 minutes  Recommendations for Outpatient Follow-up:  1. Follow-up with GARBA,LAWAL, MD in 1-2 weeks. On follow-up patient's orthostatic hypotension will need to be reassessed. Patient was discharged on TED stockings as well as abdominal binder and the decreased dose in her nortriptyline /Pamelor. Patient's Pamelor will need to be tapered off to see if that helps with her orthostatic hypotension. Patient also need a basic metabolic profile done to follow-up on electrolytes and renal function.   Discharge Diagnoses:  Principal Problem:   COPD exacerbation ([place]) Active Problems:   Orthostatic hypotension   GERD (gastroesophageal reflux disease)   Anxiety   Depression   Hypertension   Diabetes mellitus without complication (H[facility])   ADHD (attention deficit hyperactivity disorder)   Hypokalemia   Dehydration   Accelerated hypertension   Tobacco abuse   Protein-calorie malnutrition, moderate ([place])   Discharge Condition: stable and improved.  Diet recommendation: carb modified diet.  Filed Weights   06/16/15 2149 06/17/15 1020  Weight: 154.8 kg (341 lb 4.4 oz) 70.217 kg (154 lb 12.8 oz)    History of present illness:  Per Dr Dwana Curd is a 60 y.o. female with medical history significant of tobacco abuse, COPD, asthma, GERD, depression, anxiety, hypertension, ADHD, hyperlipidemia, diabetes mellitus, chronic abdominal wound, who presented with productive cough and shortness of breath, and generalized weakness.  Patient reported that she had been having cough, shortness of breath in the past 1 to 2 weeks which had been worsening in the past 2 or 3 days. She had greenish sputum production. She did not have fever, chills or chest pain. She had generalized weakness, but no unilateral weakness or tingling  sensations in the extremities. No vision change or hearing loss. Patient did not have nausea, vomiting, diarrhea, abdominal pain or symptoms of UTI. Per EMS report, pt was orthostatic.  ED Course: pt was found to have WBC 10.4, negative troponin, negative to 1.66, urinalysis is trace amount of leukocytes, temperature normal, no tachycardia, oxygen saturation 90% on room air, potassium 3.4, creatinine normal, negative chest x-ray for pneumonia. Patient was placed on telemetry bed for observation.   Hospital Course:  #1 acute COPD exacerbation Patient presented with wheezing, coughing, shortness of breath. Patient was admitted to telemetry. Chest x-ray done was negative for any acute infiltrate. Patient was placed on IV steroids, Atrovent and Xopenex nebulizers, Brovana, Pulmicort, Claritin, Flonase, PPI and IV Levaquin. Repeat chest x-ray was done which was negative for any acute infiltrate. Patient improved clinically during the hospitalization and subsequently transitioned to oral Levaquin. Patient be discharged on 3 more days of oral Levaquin to complete a five-day course of antibiotic treatment. Patient also be discharged on a steroid taper. Patient will be discharged on Symbicort and Spiriva. Outpatient follow-up.   #2 dehydration Patient noted to be dehydrated on admission. Patient was hydrated with IV fluids and was euvolemic by day of discharge.   #3 orthostatic hypotension Likely secondary to poor oral intake in the setting of nortriptyline. Patient was also static during the hospitalization she was hydrated with IV fluids and was euvolemic by day of discharge. TED stockings were also placed as well as abdominal binder. Patient was also noted to be on nortriptyline which could also contribute to orthostatic hypotension and as such patient's nortriptyline dose was decreased to 50 mg daily at bedtime. Patient will likely  need to be tapered off her nortriptyline to see whether it helps with the  orthostatic hypotension. Patient continued to have orthostasis however was asymptomatic throughout the hospitalization and was insistent on being discharged. As patient was asymptomatic, patient will be discharged home in stable condition and will need to follow-up with PCP as outpatient.  #4 hypokalemia Repleted.  #5 tobacco abuse Tobacco cessation. Nicotine patch.  #6 protein calorie malnutrition, moderate  #7 gastroesophageal reflux disease  PPI.  #8 depression/anxiety Patient was maintained on home regimen of Effexor and Xanax. Patient's nortriptyline/Pamelor dose was decreased to 50 mg daily at bedtime as this may be contributing to her orthostasis and may need to be tapered off in the outpatient setting by her PCP. Patient remained in stable condition.   #9 diabetes mellitus Patient was maintained on Lantus and sliding scale during the hospitalization. Outpatient follow-up.   Procedures:  Chest x-ray 06/16/2015, 06/17/2015  Consultations:  None  Discharge Exam: Filed Vitals:   06/19/15 0857 06/19/15 0858  BP: 164/82 158/88  Pulse:    Temp:    Resp:      General: NAD Cardiovascular: RRR Respiratory: CTAB  Discharge Instructions   Discharge Instructions    Diet Carb Modified    Complete by:  As directed      Discharge instructions    Complete by:  As directed   Follow up with GARBA,LAWAL, MD in 1-2 weeks. STOP SMOKING. WEAR TED STOCKINGS EVERYDAY. USE ABDOMINAL BINDER WHEN AMBULATING.     Increase activity slowly    Complete by:  As directed           Current Discharge Medication List    START taking these medications   Details  budesonide-formoterol (SYMBICORT) 160-4.5 MCG/ACT inhaler Inhale 2 puffs into the lungs 2 (two) times daily. Qty: 1 Inhaler, Refills: 3    dextromethorphan-guaiFENesin (MUCINEX DM) 30-600 MG 12hr tablet Take 2 tablets by mouth 2 (two) times daily. Take for 4 days then stop. Qty: 16 tablet, Refills: 0    fluticasone  (FLONASE) 50 MCG/ACT nasal spray Place 2 sprays into both nostrils daily. Take for 5 days then stop. Qty: 9.9 g, Refills: 0    levofloxacin (LEVAQUIN) 500 MG tablet Take 1 tablet (500 mg total) by mouth daily at 6 PM. Take for 3 days then stop. Qty: 3 tablet, Refills: 0    loratadine (CLARITIN) 10 MG tablet Take 1 tablet (10 mg total) by mouth daily.    metoprolol tartrate (LOPRESSOR) 25 MG tablet Take 0.5 tablets (12.5 mg total) by mouth 2 (two) times daily. Qty: 60 tablet, Refills: 0    predniSONE (DELTASONE) 20 MG tablet Take 1-3 tablets (20-60 mg total) by mouth daily with breakfast. Take 3 tablets (60mg ) daily x 2 days, then 2 tablets (40mg ) daily x 3 days, then 1 tablet (20mg ) daily x 3 days then stop. Qty: 15 tablet, Refills: 0    tiotropium (SPIRIVA HANDIHALER) 18 MCG inhalation capsule Place 1 capsule (18 mcg total) into inhaler and inhale daily. Qty: 30 capsule, Refills: 3      CONTINUE these medications which have CHANGED   Details  albuterol (PROVENTIL) (2.5 MG/3ML) 0.083% nebulizer solution Take 3 mLs (2.5 mg total) by nebulization every 4 (four) hours as needed for wheezing or shortness of breath. Use nebs 3 times daily x 4 days, then every 4 hours as needed. Qty: 75 mL, Refills: 0    nortriptyline (PAMELOR) 25 MG capsule Take 2 capsules (50 mg total) by mouth at  bedtime. Refills: 0      CONTINUE these medications which have NOT CHANGED   Details  albuterol (PROVENTIL HFA;VENTOLIN HFA) 108 (90 BASE) MCG/ACT inhaler Inhale 2 puffs into the lungs 2 (two) times daily as needed for wheezing.    alprazolam (XANAX) 2 MG tablet Take 0.5 tablets (1 mg total) by mouth at bedtime as needed for sleep or anxiety (Can take up to 3 thimes daily prn). Patient takes 1/2 tablet by mouth four times a day Qty: 30 tablet, Refills: 0    glipiZIDE (GLUCOTROL) 10 MG tablet Take 10 mg by mouth daily before breakfast.    HYDROcodone-acetaminophen (NORCO) 10-325 MG tablet Take 1 tablet by  mouth every 6 (six) hours as needed for moderate pain.  Refills: 0    LANTUS SOLOSTAR 100 UNIT/ML Solostar Pen Inject 14 Units into the skin daily as needed (for hyperglycemic events, not regularly).  Refills: 2    magnesium oxide (MAG-OX) 400 (241.3 Mg) MG tablet Take 1 tablet (400 mg total) by mouth 2 (two) times daily. For 7 days Qty: 14 tablet, Refills: 0    metFORMIN (GLUCOPHAGE) 500 MG tablet Take 500 mg by mouth 2 (two) times daily with a meal.    REXULTI 4 MG TABS Take 4 mg by mouth every morning. Refills: 4    venlafaxine XR (EFFEXOR-XR) 150 MG 24 hr capsule Take 150 mg by mouth every morning. Refills: 3    Vitamin D, Ergocalciferol, (DRISDOL) 50000 UNITS CAPS Take 50,000 Units by mouth every 7 (seven) days. On thursday       Allergies  Allergen Reactions  . Contrast Media [Iodinated Diagnostic Agents] Nausea And Vomiting  . Doxycycline Nausea And Vomiting  . Erythromycin Nausea And Vomiting  . Penicillins Itching and Rash    All over the body.  . Metronidazole Hives and Rash   Follow-up Information    Follow up with GARBA,LAWAL, MD. Schedule an appointment as soon as possible for a visit in 2 weeks.   Specialty:  Internal Medicine   Why:  F/U IN 1-2 WEEKS   Contact information:   [place] [place] [id number] [phone]        The results of significant diagnostics from this hospitalization (including imaging, microbiology, ancillary and laboratory) are listed below for reference.    Significant Diagnostic Studies: Dg Chest 2 View  06/17/2015  CLINICAL DATA:  Cough EXAM: CHEST  2 VIEW COMPARISON:  06/16/2015 FINDINGS: Lungs are clear.  No pleural effusion or pneumothorax. The heart is normal in size. Degenerative changes of the visualized thoracolumbar spine. Cervical spine fixation hardware. IMPRESSION: No evidence of acute cardiopulmonary disease. Electronically Signed   By: Julian Hy M.D.   On: 06/17/2015 09:15   Dg Chest 2  View  06/16/2015  CLINICAL DATA:  Cough, congestion for 1 week EXAM: CHEST  2 VIEW COMPARISON:  06/04/2015 FINDINGS: Cardiomediastinal silhouette is stable. No acute infiltrate or pleural effusion. No pulmonary edema. Mild degenerative changes mid thoracic spine. IMPRESSION: No active cardiopulmonary disease. Electronically Signed   By: Lahoma Crocker M.D.   On: 06/16/2015 15:35   Dg Chest 2 View  06/04/2015  CLINICAL DATA:  Cough for 2 days EXAM: CHEST  2 VIEW COMPARISON:  03/30/2015 FINDINGS: The heart size and mediastinal contours are within normal limits. Both lungs are clear. The visualized skeletal structures are unremarkable. IMPRESSION: No acute abnormality noted. Electronically Signed   By: Inez Catalina M.D.   On: 06/04/2015 16:10   Ct Head  Wo Contrast  06/04/2015  CLINICAL DATA:  Fall. Syncope. Repeated loss of consciousness. Diabetes and hypertension. EXAM: CT HEAD WITHOUT CONTRAST TECHNIQUE: Contiguous axial images were obtained from the base of the skull through the vertex without intravenous contrast. COMPARISON:  03/30/2015. FINDINGS: Sinuses/Soft tissues: No significant soft tissue swelling. Clear paranasal sinuses and mastoid air cells. Cerumen in the external ear canals bilaterally. Intracranial: No mass lesion, hemorrhage, hydrocephalus, acute infarct, intra-axial, or extra-axial fluid collection. IMPRESSION: Normal head CT. Electronically Signed   By: Abigail Miyamoto M.D.   On: 06/04/2015 16:43   Dg Hip Unilat With Pelvis 2-3 Views Left  06/04/2015  CLINICAL DATA:  Left hip pain after multiple falls. Patient reports syncope x4 in the past 2 days. EXAM: DG HIP (WITH OR WITHOUT PELVIS) 2-3V LEFT COMPARISON:  None. FINDINGS: The cortical margins of the bony pelvis and left hip are intact. No fracture. Pubic symphysis and sacroiliac joints are congruent. Both femoral heads are well-seated in the respective acetabula. IMPRESSION: No fracture or subluxation of the pelvis or left hip. Electronically  Signed   By: Jeb Levering M.D.   On: 06/04/2015 18:37    Microbiology: Recent Results (from the past 240 hour(s))  Culture, blood (routine x 2) Call MD if unable to obtain prior to antibiotics being given     Status: None (Preliminary result)   Collection Time: 06/16/15  9:52 PM  Result Value Ref Range Status   Specimen Description BLOOD RIGHT ANTECUBITAL  Final   Special Requests BOTTLES DRAWN AEROBIC AND ANAEROBIC 5CC  Final   Culture NO GROWTH 2 DAYS  Final   Report Status PENDING  Incomplete  Culture, blood (routine x 2) Call MD if unable to obtain prior to antibiotics being given     Status: None (Preliminary result)   Collection Time: 06/16/15 10:00 PM  Result Value Ref Range Status   Specimen Description BLOOD RIGHT HAND  Final   Special Requests BOTTLES DRAWN AEROBIC AND ANAEROBIC 5CC   Final   Culture NO GROWTH 2 DAYS  Final   Report Status PENDING  Incomplete     Labs: Basic Metabolic Panel:  Recent Labs Lab 06/16/15 1612 06/17/15 0451 06/17/15 0815 06/18/15 0300 06/19/15 0228  NA 138  --  138 138 137  K 3.4*  --  3.7 4.1 4.0  CL 99*  --  105 102 105  CO2 27  --  23 25 22   GLUCOSE 176*  --  174* 261* 268*  BUN 5*  --  7 14 17   CREATININE 0.66  --  0.62 0.81 0.72  CALCIUM 9.3  --  9.0 8.6* 8.4*  MG  --  1.5*  --  1.9  --    Liver Function Tests:  Recent Labs Lab 06/16/15 1612  AST 19  ALT 12*  ALKPHOS 63  BILITOT 0.3  PROT 7.1  ALBUMIN 3.5   No results for input(s): LIPASE, AMYLASE in the last 168 hours. No results for input(s): AMMONIA in the last 168 hours. CBC:  Recent Labs Lab 06/16/15 1612 06/17/15 0815 06/18/15 0300 06/19/15 0228  WBC 10.4 6.9 10.1 9.2  NEUTROABS 5.7  --   --   --   HGB 15.7* 14.5 13.4 13.2  HCT 47.4* 44.7 42.7 41.1  MCV 88.1 89.9 91.0 92.6  PLT 208 175 173 158   Cardiac Enzymes: No results for input(s): CKTOTAL, CKMB, CKMBINDEX, TROPONINI in the last 168 hours. BNP: BNP (last 3 results) No results for  input(s):  BNP in the last 8760 hours.  ProBNP (last 3 results) No results for input(s): PROBNP in the last 8760 hours.  CBG:  Recent Labs Lab 06/18/15 1126 06/18/15 1704 06/18/15 2041 06/19/15 0624 06/19/15 1105  GLUCAP 198* 263* 173* 262* 234*       Signed:  THOMPSON,DANIEL MD.  [facility] 06/19/2015, 1:23 PM

## 2015-06-21 LAB — CULTURE, BLOOD (ROUTINE X 2)
CULTURE: NO GROWTH
Culture: NO GROWTH

## 2015-07-16 IMAGING — CR DG CHEST 2V
2 series · 2 of 2 positions shown · non-contrast
Comparison: 12/21/2013

CLINICAL DATA: Short breath, cough and congestion

EXAM:
CHEST  2 VIEW

[chest pa]
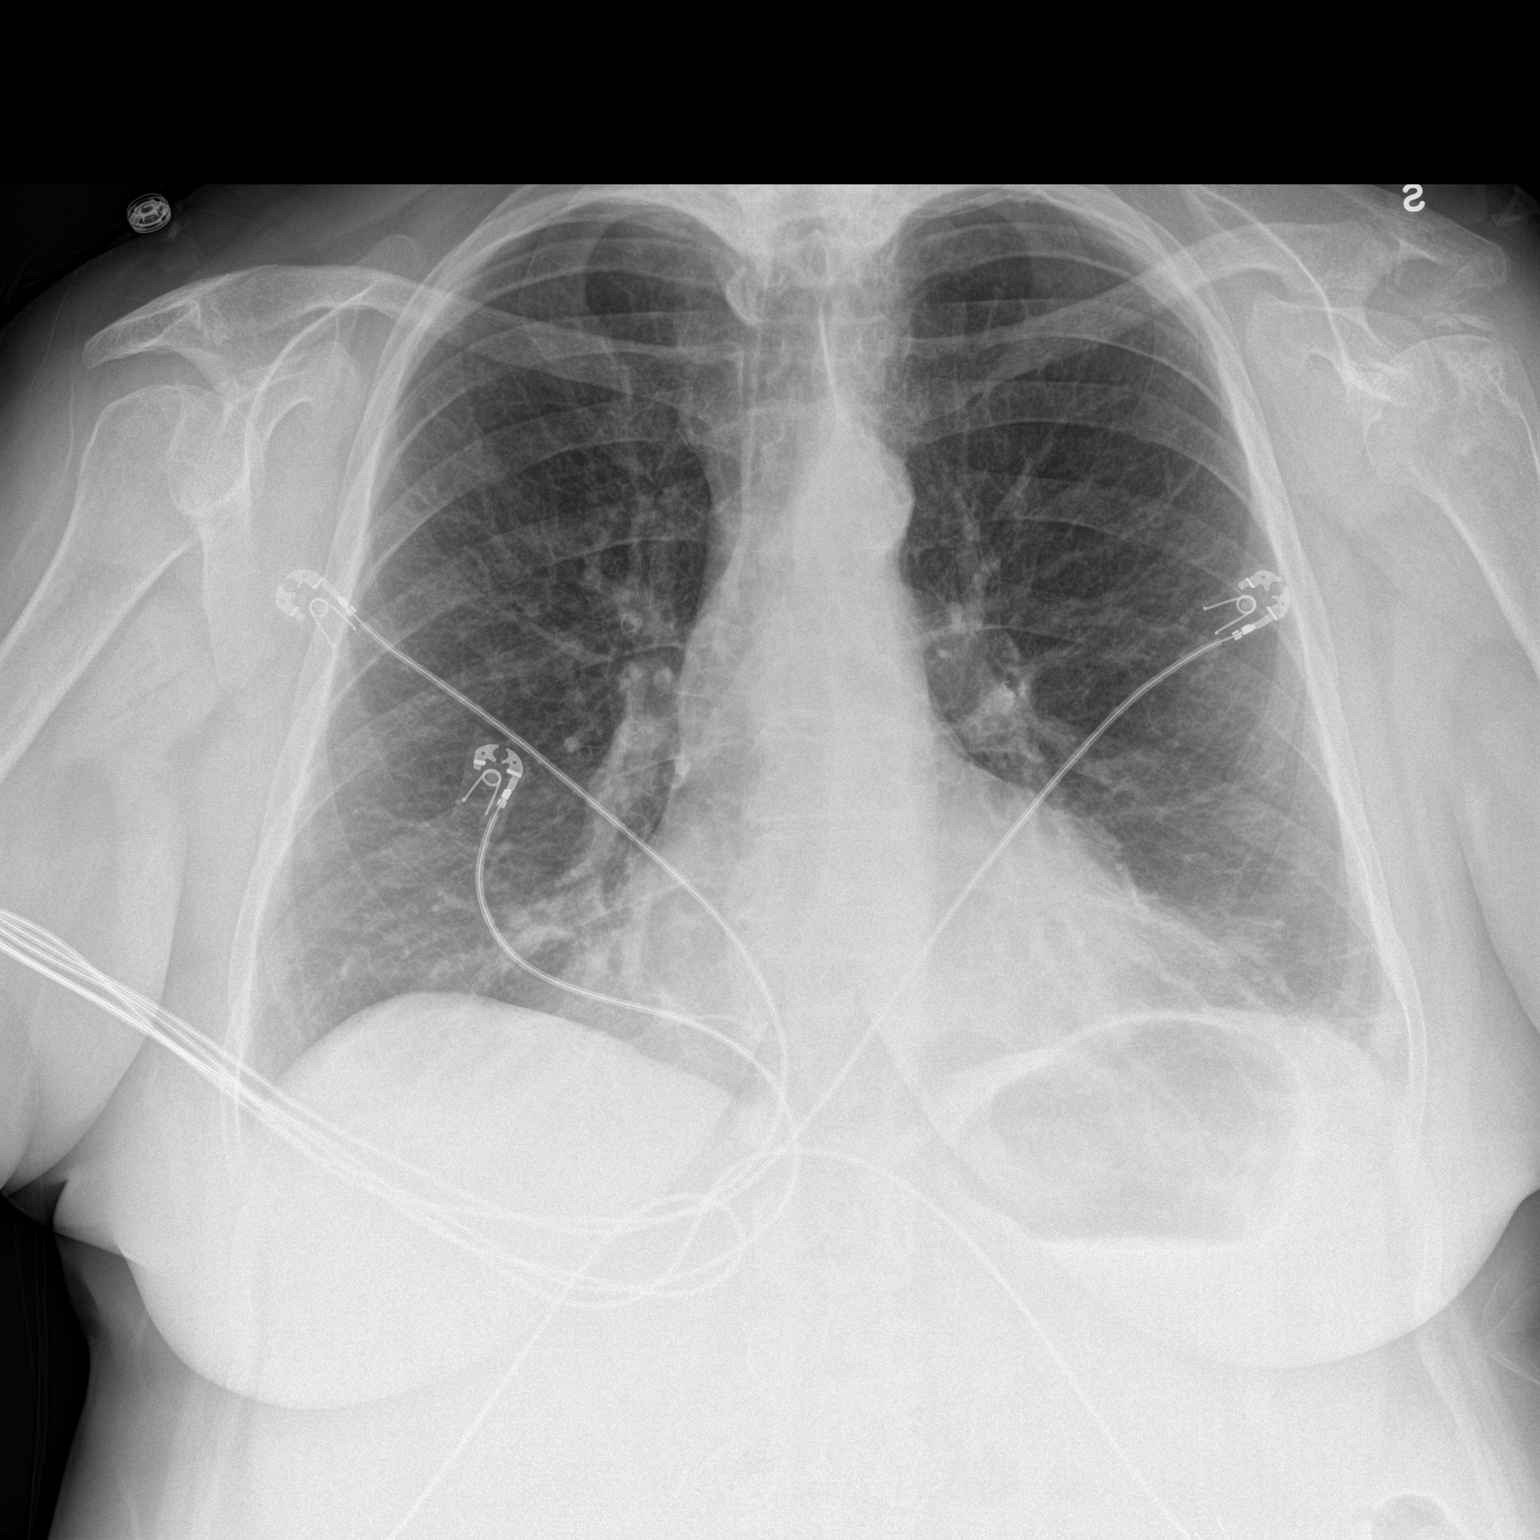

[chest lat]
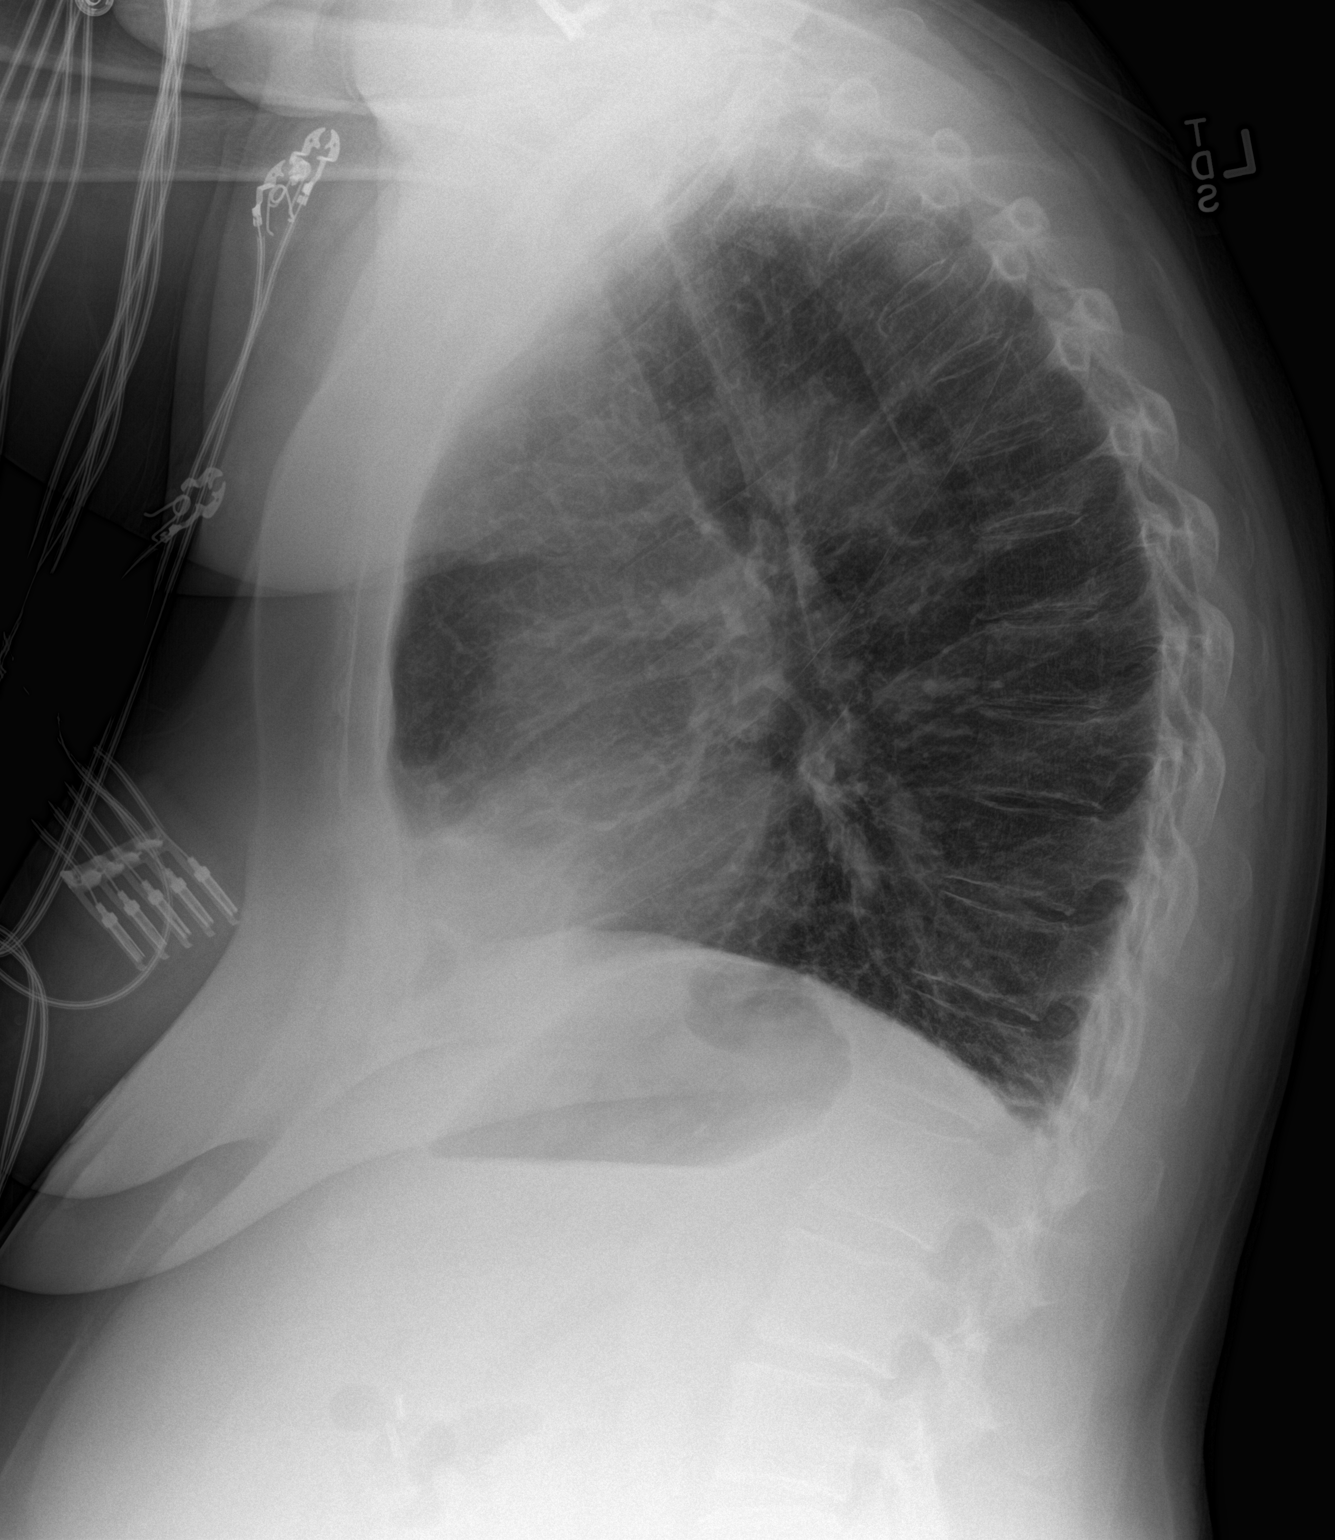

[2 of 2 positions shown; findings below may reference images not displayed]

FINDINGS: Normal cardiac silhouette. There is linear markings at the left and
right lung base. These are increased compared to prior. For lungs
are clear. Lungs are hyperinflated. No acute osseous abnormality.
IMPRESSION: Linear markings in the lung base suggest bronchitis.

## 2016-06-06 ENCOUNTER — Other Ambulatory Visit: Payer: Self-pay | Admitting: Internal Medicine

## 2016-06-06 DIAGNOSIS — Z1231 Encounter for screening mammogram for malignant neoplasm of breast: Secondary | ICD-10-CM

## 2016-08-08 ENCOUNTER — Emergency Department (HOSPITAL_COMMUNITY): Payer: Medicare Other

## 2016-08-08 ENCOUNTER — Other Ambulatory Visit: Payer: Self-pay

## 2016-08-08 ENCOUNTER — Observation Stay (HOSPITAL_COMMUNITY)
Admission: EM | Admit: 2016-08-08 | Discharge: 2016-08-09 | Disposition: A | Discharge status: Home / Self Care | Payer: Medicare Other | Attending: Internal Medicine | Admitting: Internal Medicine

## 2016-08-08 ENCOUNTER — Encounter (HOSPITAL_COMMUNITY): Payer: Self-pay | Admitting: Emergency Medicine

## 2016-08-08 DIAGNOSIS — I493 Ventricular premature depolarization: Secondary | ICD-10-CM | POA: Diagnosis not present

## 2016-08-08 DIAGNOSIS — F17200 Nicotine dependence, unspecified, uncomplicated: Secondary | ICD-10-CM | POA: Diagnosis not present

## 2016-08-08 DIAGNOSIS — R079 Chest pain, unspecified: Secondary | ICD-10-CM | POA: Insufficient documentation

## 2016-08-08 DIAGNOSIS — Z9981 Dependence on supplemental oxygen: Secondary | ICD-10-CM | POA: Insufficient documentation

## 2016-08-08 DIAGNOSIS — Z79899 Other long term (current) drug therapy: Secondary | ICD-10-CM | POA: Insufficient documentation

## 2016-08-08 DIAGNOSIS — F17210 Nicotine dependence, cigarettes, uncomplicated: Secondary | ICD-10-CM | POA: Insufficient documentation

## 2016-08-08 DIAGNOSIS — I951 Orthostatic hypotension: Secondary | ICD-10-CM | POA: Insufficient documentation

## 2016-08-08 DIAGNOSIS — I110 Hypertensive heart disease with heart failure: Secondary | ICD-10-CM | POA: Insufficient documentation

## 2016-08-08 DIAGNOSIS — K219 Gastro-esophageal reflux disease without esophagitis: Secondary | ICD-10-CM | POA: Diagnosis not present

## 2016-08-08 DIAGNOSIS — F329 Major depressive disorder, single episode, unspecified: Secondary | ICD-10-CM | POA: Insufficient documentation

## 2016-08-08 DIAGNOSIS — E440 Moderate protein-calorie malnutrition: Secondary | ICD-10-CM | POA: Insufficient documentation

## 2016-08-08 DIAGNOSIS — E119 Type 2 diabetes mellitus without complications: Secondary | ICD-10-CM | POA: Diagnosis not present

## 2016-08-08 DIAGNOSIS — Z880 Allergy status to penicillin: Secondary | ICD-10-CM | POA: Insufficient documentation

## 2016-08-08 DIAGNOSIS — Z85828 Personal history of other malignant neoplasm of skin: Secondary | ICD-10-CM | POA: Diagnosis not present

## 2016-08-08 DIAGNOSIS — J440 Chronic obstructive pulmonary disease with acute lower respiratory infection: Secondary | ICD-10-CM | POA: Insufficient documentation

## 2016-08-08 DIAGNOSIS — Z8541 Personal history of malignant neoplasm of cervix uteri: Secondary | ICD-10-CM | POA: Insufficient documentation

## 2016-08-08 DIAGNOSIS — Z720 Tobacco use: Secondary | ICD-10-CM

## 2016-08-08 DIAGNOSIS — Z981 Arthrodesis status: Secondary | ICD-10-CM | POA: Insufficient documentation

## 2016-08-08 DIAGNOSIS — E785 Hyperlipidemia, unspecified: Secondary | ICD-10-CM | POA: Diagnosis not present

## 2016-08-08 DIAGNOSIS — F909 Attention-deficit hyperactivity disorder, unspecified type: Secondary | ICD-10-CM | POA: Insufficient documentation

## 2016-08-08 DIAGNOSIS — L989 Disorder of the skin and subcutaneous tissue, unspecified: Secondary | ICD-10-CM | POA: Insufficient documentation

## 2016-08-08 DIAGNOSIS — J209 Acute bronchitis, unspecified: Secondary | ICD-10-CM | POA: Insufficient documentation

## 2016-08-08 DIAGNOSIS — I10 Essential (primary) hypertension: Secondary | ICD-10-CM | POA: Diagnosis not present

## 2016-08-08 DIAGNOSIS — F419 Anxiety disorder, unspecified: Secondary | ICD-10-CM | POA: Insufficient documentation

## 2016-08-08 DIAGNOSIS — Z801 Family history of malignant neoplasm of trachea, bronchus and lung: Secondary | ICD-10-CM | POA: Insufficient documentation

## 2016-08-08 DIAGNOSIS — Z7984 Long term (current) use of oral hypoglycemic drugs: Secondary | ICD-10-CM | POA: Insufficient documentation

## 2016-08-08 DIAGNOSIS — Z825 Family history of asthma and other chronic lower respiratory diseases: Secondary | ICD-10-CM | POA: Insufficient documentation

## 2016-08-08 DIAGNOSIS — J40 Bronchitis, not specified as acute or chronic: Secondary | ICD-10-CM | POA: Diagnosis present

## 2016-08-08 DIAGNOSIS — E876 Hypokalemia: Secondary | ICD-10-CM | POA: Diagnosis not present

## 2016-08-08 DIAGNOSIS — Z91041 Radiographic dye allergy status: Secondary | ICD-10-CM | POA: Insufficient documentation

## 2016-08-08 DIAGNOSIS — J441 Chronic obstructive pulmonary disease with (acute) exacerbation: Principal | ICD-10-CM | POA: Insufficient documentation

## 2016-08-08 DIAGNOSIS — E860 Dehydration: Secondary | ICD-10-CM | POA: Insufficient documentation

## 2016-08-08 DIAGNOSIS — I5032 Chronic diastolic (congestive) heart failure: Secondary | ICD-10-CM | POA: Diagnosis not present

## 2016-08-08 DIAGNOSIS — F32A Depression, unspecified: Secondary | ICD-10-CM | POA: Diagnosis present

## 2016-08-08 DIAGNOSIS — Z9049 Acquired absence of other specified parts of digestive tract: Secondary | ICD-10-CM | POA: Insufficient documentation

## 2016-08-08 DIAGNOSIS — Z90710 Acquired absence of both cervix and uterus: Secondary | ICD-10-CM | POA: Insufficient documentation

## 2016-08-08 DIAGNOSIS — Z881 Allergy status to other antibiotic agents status: Secondary | ICD-10-CM | POA: Insufficient documentation

## 2016-08-08 LAB — BASIC METABOLIC PANEL
Anion gap: 15 (ref 5–15)
CALCIUM: 9.4 mg/dL (ref 8.9–10.3)
CHLORIDE: 95 mmol/L — AB (ref 101–111)
CO2: 25 mmol/L (ref 22–32)
CREATININE: 0.76 mg/dL (ref 0.44–1.00)
GFR calc Af Amer: >60 mL/min (ref >60)
GFR calc non Af Amer: >60 mL/min (ref >60)
GLUCOSE: 235 mg/dL — AB (ref 65–99)
Potassium: 2.6 mmol/L — CL (ref 3.5–5.1)
Sodium: 135 mmol/L (ref 135–145)

## 2016-08-08 LAB — URINALYSIS, ROUTINE W REFLEX MICROSCOPIC
Bilirubin Urine: NEGATIVE
GLUCOSE, UA: NEGATIVE mg/dL
Hgb urine dipstick: NEGATIVE
KETONES UR: NEGATIVE mg/dL
Nitrite: NEGATIVE
PROTEIN: NEGATIVE mg/dL
Specific Gravity, Urine: 1.005 (ref 1.005–1.030)
pH: 7 (ref 5.0–8.0)

## 2016-08-08 LAB — D-DIMER, QUANTITATIVE: D-Dimer, Quant: 0.45 ug/mL-FEU (ref 0.00–0.50)

## 2016-08-08 LAB — CBC
HCT: 44.6 % (ref 36.0–46.0)
Hemoglobin: 15 g/dL (ref 12.0–15.0)
MCH: 29 pg (ref 26.0–34.0)
MCHC: 33.6 g/dL (ref 30.0–36.0)
MCV: 86.3 fL (ref 78.0–100.0)
PLATELETS: 248 10*3/uL (ref 150–400)
RBC: 5.17 MIL/uL — ABNORMAL HIGH (ref 3.87–5.11)
RDW: 12.5 % (ref 11.5–15.5)
WBC: 9.4 10*3/uL (ref 4.0–10.5)

## 2016-08-08 LAB — RAPID URINE DRUG SCREEN, HOSP PERFORMED
AMPHETAMINES: NOT DETECTED
BENZODIAZEPINES: POSITIVE — AB
Barbiturates: NOT DETECTED
Cocaine: NOT DETECTED
OPIATES: POSITIVE — AB
TETRAHYDROCANNABINOL: NOT DETECTED

## 2016-08-08 LAB — I-STAT TROPONIN, ED: TROPONIN I, POC: 0 ng/mL (ref 0.00–0.08)

## 2016-08-08 LAB — TROPONIN I: Troponin I: <0.03 ng/mL (ref <0.03)

## 2016-08-08 LAB — MAGNESIUM: Magnesium: 1.4 mg/dL — ABNORMAL LOW (ref 1.7–2.4)

## 2016-08-08 LAB — NA AND K (SODIUM & POTASSIUM), RAND UR
POTASSIUM UR: 14 mmol/L
SODIUM UR: 39 mmol/L

## 2016-08-08 LAB — GLUCOSE, CAPILLARY: Glucose-Capillary: 236 mg/dL — ABNORMAL HIGH (ref 65–99)

## 2016-08-08 MED ORDER — SODIUM CHLORIDE 0.9% FLUSH
3.0000 mL | Freq: Two times a day (BID) | INTRAVENOUS | Status: DC
  Administered 2016-08-09 (×2): 3 mL via INTRAVENOUS

## 2016-08-08 MED ORDER — IPRATROPIUM-ALBUTEROL 0.5-2.5 (3) MG/3ML IN SOLN
3.0000 mL | Freq: Four times a day (QID) | RESPIRATORY_TRACT | Status: DC
  Administered 2016-08-08 – 2016-08-09 (×2): 3 mL via RESPIRATORY_TRACT
  Filled 2016-08-08 (×2): qty 3

## 2016-08-08 MED ORDER — SODIUM CHLORIDE 0.9 % IV SOLN
INTRAVENOUS | Status: AC
  Administered 2016-08-08: via INTRAVENOUS

## 2016-08-08 MED ORDER — ENOXAPARIN SODIUM 40 MG/0.4ML ~~LOC~~ SOLN
40.0000 mg | SUBCUTANEOUS | Status: DC
  Administered 2016-08-09: 40 mg via SUBCUTANEOUS
  Filled 2016-08-08: qty 0.4

## 2016-08-08 MED ORDER — NORTRIPTYLINE HCL 25 MG PO CAPS
75.0000 mg | ORAL_CAPSULE | Freq: Every day | ORAL | Status: DC
  Administered 2016-08-09: 75 mg via ORAL
  Filled 2016-08-08 (×3): qty 3

## 2016-08-08 MED ORDER — ACETAMINOPHEN 650 MG RE SUPP: 650.0000 mg | Freq: Four times a day (QID) | RECTAL | Status: DC | PRN

## 2016-08-08 MED ORDER — GUAIFENESIN ER 600 MG PO TB12
600.0000 mg | ORAL_TABLET | Freq: Two times a day (BID) | ORAL | Status: DC
  Administered 2016-08-08 – 2016-08-09 (×2): 600 mg via ORAL
  Filled 2016-08-08 (×2): qty 1

## 2016-08-08 MED ORDER — DOXYCYCLINE HYCLATE 100 MG IV SOLR
100.0000 mg | Freq: Two times a day (BID) | INTRAVENOUS | Status: DC
  Administered 2016-08-08 – 2016-08-09 (×2): 100 mg via INTRAVENOUS
  Filled 2016-08-08 (×3): qty 100

## 2016-08-08 MED ORDER — ALBUTEROL SULFATE (2.5 MG/3ML) 0.083% IN NEBU: 2.5000 mg | INHALATION_SOLUTION | RESPIRATORY_TRACT | Status: DC | PRN

## 2016-08-08 MED ORDER — INSULIN ASPART 100 UNIT/ML ~~LOC~~ SOLN
0.0000 [IU] | Freq: Three times a day (TID) | SUBCUTANEOUS | Status: DC
  Administered 2016-08-09 (×2): 5 [IU] via SUBCUTANEOUS

## 2016-08-08 MED ORDER — SENNA 8.6 MG PO TABS
1.0000 | ORAL_TABLET | Freq: Two times a day (BID) | ORAL | Status: DC
  Administered 2016-08-08 – 2016-08-09 (×2): 8.6 mg via ORAL
  Filled 2016-08-08 (×2): qty 1

## 2016-08-08 MED ORDER — INSULIN ASPART 100 UNIT/ML ~~LOC~~ SOLN
0.0000 [IU] | Freq: Every day | SUBCUTANEOUS | Status: DC
  Administered 2016-08-09: 2 [IU] via SUBCUTANEOUS

## 2016-08-08 MED ORDER — METOPROLOL TARTRATE 12.5 MG HALF TABLET
12.5000 mg | ORAL_TABLET | Freq: Two times a day (BID) | ORAL | Status: DC
  Administered 2016-08-08 – 2016-08-09 (×2): 12.5 mg via ORAL
  Filled 2016-08-08 (×2): qty 1

## 2016-08-08 MED ORDER — NICOTINE 21 MG/24HR TD PT24
21.0000 mg | MEDICATED_PATCH | Freq: Every day | TRANSDERMAL | Status: DC
  Administered 2016-08-09: 21 mg via TRANSDERMAL
  Filled 2016-08-08: qty 1

## 2016-08-08 MED ORDER — POTASSIUM CHLORIDE 10 MEQ/100ML IV SOLN
10.0000 meq | Freq: Once | INTRAVENOUS | Status: AC
  Administered 2016-08-08: 10 meq via INTRAVENOUS
  Filled 2016-08-08: qty 100

## 2016-08-08 MED ORDER — ACETAMINOPHEN 325 MG PO TABS: 650.0000 mg | ORAL_TABLET | Freq: Four times a day (QID) | ORAL | Status: DC | PRN

## 2016-08-08 MED ORDER — PREDNISONE 20 MG PO TABS
40.0000 mg | ORAL_TABLET | Freq: Every day | ORAL | Status: DC
  Administered 2016-08-09: 40 mg via ORAL
  Filled 2016-08-08: qty 2

## 2016-08-08 MED ORDER — ONDANSETRON HCL 4 MG/2ML IJ SOLN: 4.0000 mg | Freq: Four times a day (QID) | INTRAMUSCULAR | Status: DC | PRN

## 2016-08-08 MED ORDER — ALPRAZOLAM 0.5 MG PO TABS
1.0000 mg | ORAL_TABLET | Freq: Three times a day (TID) | ORAL | Status: DC
  Administered 2016-08-08 – 2016-08-09 (×4): 1 mg via ORAL
  Filled 2016-08-08 (×4): qty 2

## 2016-08-08 MED ORDER — HYDROCODONE-ACETAMINOPHEN 10-325 MG PO TABS
1.0000 | ORAL_TABLET | Freq: Four times a day (QID) | ORAL | Status: DC | PRN
  Administered 2016-08-09: 1 via ORAL
  Filled 2016-08-08: qty 1

## 2016-08-08 MED ORDER — NEFAZODONE HCL 200 MG PO TABS
200.0000 mg | ORAL_TABLET | Freq: Two times a day (BID) | ORAL | Status: DC
  Administered 2016-08-09 (×2): 200 mg via ORAL
  Filled 2016-08-08 (×4): qty 1

## 2016-08-08 MED ORDER — POTASSIUM CHLORIDE CRYS ER 20 MEQ PO TBCR
40.0000 meq | EXTENDED_RELEASE_TABLET | Freq: Once | ORAL | Status: AC
  Administered 2016-08-08: 40 meq via ORAL
  Filled 2016-08-08: qty 2

## 2016-08-08 MED ORDER — POLYETHYLENE GLYCOL 3350 17 G PO PACK: 17.0000 g | PACK | Freq: Every day | ORAL | Status: DC | PRN

## 2016-08-08 MED ORDER — LIOTHYRONINE SODIUM 25 MCG PO TABS
25.0000 ug | ORAL_TABLET | ORAL | Status: DC
  Administered 2016-08-09: 25 ug via ORAL
  Filled 2016-08-08 (×2): qty 1

## 2016-08-08 MED ORDER — ONDANSETRON HCL 4 MG PO TABS: 4.0000 mg | ORAL_TABLET | Freq: Four times a day (QID) | ORAL | Status: DC | PRN

## 2016-08-08 NOTE — ED Provider Notes (Signed)
[facility] DEPT Provider Note   CSN: [id number] Arrival date & time: 08/08/16  1348     History   Chief Complaint Chief Complaint  Patient presents with  . Chest Pain  . Shortness of Breath    HPI [name] is a 61 y.o. female.  HPI Patient presents with left-sided chest pain. Worsening shortness breath and cough for the last 2-3 days. Minimal sputum production. Mild relief with her inhaler at home. No swelling or legs. States she has a fair amount of pain with breathing. States it hurts with each breath. No fevers. History of COPD. Pain is sharp and worse with breathing.   Past Medical History:  Diagnosis Date  . ADD (attention deficit disorder with hyperactivity)   . Anxiety   . Arthritis    "neck" (06/16/2015)  . Asthma   . Basal cell carcinoma, face   . Cervical cancer ([place])   . Chronic bronchitis ([place])   . 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 COPD (chronic obstructive pulmonary disease) ([place])   . Depression   . GERD (gastroesophageal reflux disease)   . Hypertension   . On home oxygen therapy    "2L at bedtime" (06/16/2015)  . Pneumonia 1980s X 1  . Squamous cell carcinoma, face   . Thyroid cyst   . Type II diabetes mellitus [facility])     Patient Active Problem List   Diagnosis Date Noted  . Protein-calorie malnutrition, moderate ([place]) 06/16/2015  . Hypokalemia 06/04/2015  . Orthostatic hypotension 06/04/2015  . Syncope 06/04/2015  . Head injury with loss of consciousness ([place]) 06/04/2015  . Dehydration 06/04/2015  . Accelerated hypertension 06/04/2015  . Syncope and collapse 06/04/2015  . Pyuria 06/04/2015  . Tobacco abuse 06/04/2015  . Diabetes mellitus without complication ([place]) 40/98/1191  . HLD (hyperlipidemia) 01/03/2014  . COPD exacerbation ([place]) 01/03/2014  . Depression   . Hypertension   . COPD (chronic obstructive pulmonary disease) ([place])   . Asthma   . ADHD (attention deficit hyperactivity disorder)   . Chest pain 12/15/2013  . GERD  (gastroesophageal reflux disease)   . Anxiety   . Bronchitis   . Skin lesion of right arm 10/20/2011  . Post op infection 03/24/2011  . Incisional hernia 01/20/2011  . Wound infection after surgery 09/09/2010    Past Surgical History:  Procedure Laterality Date  . ABDOMINAL HYSTERECTOMY    . ANTERIOR CERVICAL DECOMP/DISCECTOMY FUSION    . BACK SURGERY    . BASAL CELL CARCINOMA EXCISION     "face"  . COLECTOMY    . EXCISIONAL HEMORRHOIDECTOMY    . HERNIA REPAIR  X 3   incisional/ventral hernia repair "in my stomach where the womb is"  . LAPAROSCOPIC CHOLECYSTECTOMY    . OOPHORECTOMY  2006   cervical cancer  . RECTOVAGINAL FISTULA CLOSURE  2012  . SQUAMOUS CELL CARCINOMA EXCISION    . TUBAL LIGATION    . VULVA SURGERY  2006   Cancer in situ  . WRIST SURGERY Right X 1   "accidentially cut it"  . WRIST SURGERY Left    "don't remember why"    OB History    No data available       Home Medications    Prior to Admission medications   Medication Sig Start Date End Date Taking? Authorizing Provider  albuterol (PROVENTIL HFA;VENTOLIN HFA) 108 (90 BASE) MCG/ACT inhaler Inhale 2 puffs into the lungs 2 (two) times daily as needed for wheezing.   Yes [provider]  alprazolam (XANAX) 2 MG tablet Take 0.5 tablets (1 mg total) by mouth at bedtime as needed for sleep or anxiety (Can take up to 3 thimes daily prn). Patient takes 1/2 tablet by mouth four times a day Patient taking differently: Take 1-2 mg by mouth 4 (four) times daily - after meals and at bedtime. Patient takes 1/2 tab three times daily and 1 tab at bedtime 01/05/14  Yes Theodis Blaze, MD  HYDROcodone-acetaminophen [facility]) 10-325 MG tablet Take 1 tablet by mouth every 6 (six) hours as needed for moderate pain.  03/05/15  Yes [provider]  liothyronine (CYTOMEL) 25 MCG tablet Take 25 mcg by mouth every morning. 05/22/16  Yes [provider]  metFORMIN (GLUCOPHAGE) 500 MG tablet Take 500 mg  by mouth 2 (two) times daily with a meal.   Yes [provider]  metoprolol tartrate (LOPRESSOR) 25 MG tablet Take 0.5 tablets (12.5 mg total) by mouth 2 (two) times daily. 06/19/15  Yes Eugenie Filler, MD  nefazodone (SERZONE) 100 MG tablet Take 200 mg by mouth 2 (two) times daily. 07/26/16  Yes [provider]  nortriptyline (PAMELOR) 25 MG capsule Take 2 capsules (50 mg total) by mouth at bedtime. Patient taking differently: Take 75 mg by mouth at bedtime.  06/19/15  Yes Eugenie Filler, MD  REXULTI 4 MG TABS Take 4 mg by mouth every morning. 03/11/15  Yes [provider]  albuterol (PROVENTIL) (2.5 MG/3ML) 0.083% nebulizer solution Take 3 mLs (2.5 mg total) by nebulization every 4 (four) hours as needed for wheezing or shortness of breath. Use nebs 3 times daily x 4 days, then every 4 hours as needed. Patient not taking: Reported on 08/08/2016 06/19/15   Eugenie Filler, MD  budesonide-formoterol [facility]) 160-4.5 MCG/ACT inhaler Inhale 2 puffs into the lungs 2 (two) times daily. Patient not taking: Reported on 08/08/2016 06/19/15   Eugenie Filler, MD  dextromethorphan-guaiFENesin [facility] DM) 30-600 MG 12hr tablet Take 2 tablets by mouth 2 (two) times daily. Take for 4 days then stop. Patient not taking: Reported on 08/08/2016 06/19/15   Eugenie Filler, MD  fluticasone [facility]) 50 MCG/ACT nasal spray Place 2 sprays into both nostrils daily. Take for 5 days then stop. Patient not taking: Reported on 08/08/2016 06/19/15   Eugenie Filler, MD  glipiZIDE (GLUCOTROL) 10 MG tablet Take 10 mg by mouth 2 (two) times daily.     [provider]  levofloxacin (LEVAQUIN) 500 MG tablet Take 1 tablet (500 mg total) by mouth daily at 6 PM. Take for 3 days then stop. Patient not taking: Reported on 08/08/2016 06/19/15   Eugenie Filler, MD  loratadine (CLARITIN) 10 MG tablet Take 1 tablet (10 mg total) by mouth daily. Patient not taking: Reported on 08/08/2016 06/19/15    Eugenie Filler, MD  magnesium oxide (MAG-OX) 400 (241.3 Mg) MG tablet Take 1 tablet (400 mg total) by mouth 2 (two) times daily. For 7 days Patient not taking: Reported on 08/08/2016 06/05/15   Bonnielee Haff, MD  predniSONE (DELTASONE) 20 MG tablet Take 1-3 tablets (20-60 mg total) by mouth daily with breakfast. Take 3 tablets (60mg ) daily x 2 days, then 2 tablets (40mg ) daily x 3 days, then 1 tablet (20mg ) daily x 3 days then stop. Patient not taking: Reported on 08/08/2016 06/20/15   Eugenie Filler, MD  tiotropium (SPIRIVA HANDIHALER) 18 MCG inhalation capsule Place 1 capsule (18 mcg total) into inhaler and inhale daily.  Patient not taking: Reported on 08/08/2016 06/19/15   Eugenie Filler, MD  venlafaxine XR (EFFEXOR-XR) 150 MG 24 hr capsule Take 150 mg by mouth every morning. 05/13/15   [provider]  Vitamin D, Ergocalciferol, (DRISDOL) 50000 UNITS CAPS Take 50,000 Units by mouth every 7 (seven) days. On thursday    [provider]    Family History Family History  Problem Relation Age of Onset  . COPD Father   . Cancer Father        lung  . Cancer Paternal Aunt        lung    Social History Social History  Substance Use Topics  . Smoking status: Current Every Day Smoker    Packs/day: 1.00    Years: 46.00    Types: Cigarettes  . Smokeless tobacco: Never Used  . Alcohol use No     Allergies   Contrast media [iodinated diagnostic agents]; Doxycycline; Erythromycin; Penicillins; and Metronidazole   Review of Systems Review of Systems  Constitutional: Negative for activity change.  HENT: Negative for congestion.   Respiratory: Positive for shortness of breath.   Cardiovascular: Positive for chest pain. Negative for leg swelling.  Gastrointestinal: Negative for abdominal pain.  Genitourinary: Negative for dysuria.  Musculoskeletal: Negative for back pain.  Neurological: Negative for numbness.  Hematological: Negative for adenopathy.    Psychiatric/Behavioral: Negative for confusion.     Physical Exam Updated Vital Signs BP (!) 141/89   Pulse 90   Temp 97.8 F (36.6 C) (Oral)   Resp 17   SpO2 93%   Physical Exam  Constitutional: She appears well-developed.  HENT:  Head: Atraumatic.  Neck: Neck supple.  Cardiovascular: Normal rate.   Pulmonary/Chest: Effort normal. She has no wheezes. She has no rales. She exhibits no tenderness.  Abdominal: There is no tenderness.  Musculoskeletal: She exhibits no edema.  Neurological: She is alert.  Skin: Skin is warm. Capillary refill takes less than 2 seconds.     ED Treatments / Results  Labs (all labs ordered are listed, but only abnormal results are displayed) Labs Reviewed  BASIC METABOLIC PANEL - Abnormal; Notable for the following:       Result Value   Potassium 2.6 (*)    Chloride 95 (*)    Glucose, Bld 235 (*)    BUN <5 (*)    All other components within normal limits  CBC - Abnormal; Notable for the following:    RBC 5.17 (*)    All other components within normal limits  D-DIMER, QUANTITATIVE (NOT AT [facility])  Randolm Idol, ED    EKG  EKG Interpretation  Date/Time:  Tuesday August 08 2016 13:52:49 EDT Ventricular Rate:  100 PR Interval:  144 QRS Duration: 96 QT Interval:  350 QTC Calculation: 451 R Axis:   50 Text Interpretation:  Sinus rhythm with occasional Premature ventricular complexes Possible Left atrial enlargement Nonspecific ST abnormality Abnormal ECG Confirmed by Alvino Chapel  MD, Ovid Curd [phone]) on 08/08/2016 4:23:30 PM       Radiology Dg Chest 2 View  Result Date: 08/08/2016 CLINICAL DATA:  Chest pain. EXAM: CHEST  2 VIEW COMPARISON:  06/17/2015. FINDINGS: Mediastinum and hilar structures normal. Mild peribronchial cuffing. Bronchitis cannot be excluded. Mild bibasilar subsegmental atelectasis. No pleural effusion or pneumothorax. Heart size normal. No acute bony abnormality. Cervical spine fusion. IMPRESSION: Mild peribronchial  cuffing. Bronchitis can't be excluded . Mild bibasilar subsegmental atelectasis. Otherwise negative exam. Electronically Signed   By: Marcello Moores  Register  On: 08/08/2016 14:44    Procedures Procedures (including critical care time)  Medications Ordered in ED Medications  potassium chloride 10 mEq in 100 mL IVPB (0 mEq Intravenous Stopped 08/08/16 1916)  potassium chloride SA (K-DUR,KLOR-CON) CR tablet 40 mEq (40 mEq Oral Given 08/08/16 1751)     Initial Impression / Assessment and Plan / ED Course  I have reviewed the triage vital signs and the nursing notes.  Pertinent labs & imaging results that were available during my care of the patient were reviewed by me and considered in my medical decision making (see chart for details).     Patient presents with chest pain. Not hypoxic but does have history of COPD. Chest pain is worse with exertion. Not hypoxic with walking. Negative d-dimer. Nonspecific EKG changes. I think he should benefit from observation in the hospital for further evaluation this chest pain.  Final Clinical Impressions(s) / ED Diagnoses   Final diagnoses:  Chest pain, unspecified type    New Prescriptions New Prescriptions   No medications on file     Davonna Belling, MD 08/08/16 Joen Laura

## 2016-08-08 NOTE — ED Triage Notes (Signed)
Pt sts left sided CP and SOB worse with cough x 3 days

## 2016-08-08 NOTE — ED Notes (Signed)
Ambulated Pt. Down hallway approximately 40 ft total, pt. Stated she felt SOB and had the same CP when she came in. O2 sats 96% consistent during ambulation.

## 2016-08-08 NOTE — H&P (Addendum)
[name] MOQ:[id number] DOB: 1955-03-29 DOA: 08/08/2016     PCP: Elwyn Reach, MD   Outpatient Specialists: Card[facility] [place], [facility] [place] Patient coming from:  home Lives alone,        Chief Complaint: chest pain  HPI: [name] is a 61 y.o. female with medical history significant of DM 2, HTN,  COPD on home oxygen 2 L at bedtime, asthma, GERD, depression, anxiety, ADHD, hyperlipidemia, tobacco abuse, chronic diastolic CHF  Presented with left-sided chest pain and some shortness of breath associated that has had some cough for past few days she doesn't known history of COPD have had some sputum production tryed to use her inhaler before with some  Improvement. She is no longer on Symbicort. Still smokes 1 pack a day. States she may have to quit.  Reports some wheezing. Cough productive of yellowish sputum. No leg swelling. She has not traveled no leg swelling. Sometimes hurts with respirations or cough. She denies any fever pain feels sharp. Is ambulating in the emergency department was able to walk 40 feet without desaturation.     Regarding pertinent Chronic problems: Known history of asthma and COPD with frequent admissions for past few years. Last echogram done in April 2017 preserved EF grade 1 diastolic dysfunction  Patient have had recurrent episodes of hypokalemia and hypomagnesemia unclear etiology.  Patient has history of diabetes reports poor compliance  IN ER:  Temp (24hrs), Avg:98.3 F (36.8 C), Min:97.8 F (36.6 C), Max:98.8 F (37.1 C)      on arrival  ED Triage Vitals  Enc Vitals Group     BP 08/08/16 1359 123/86     Pulse Rate 08/08/16 1359 (!) 103     Resp 08/08/16 1359 17     Temp 08/08/16 1359 98.8 F (37.1 C)     Temp Source 08/08/16 1359 Oral     SpO2 08/08/16 1359 96 %     Weight --      Height --      Head Circumference --      Peak Flow --      Pain Score 08/08/16 1357 7     Pain Loc --      Pain Edu? --      Excl. in  [place]? --     RR 17 93% HR 90 BP 141/89 D.dimer 0.45 Trop 0.00 K 2.6 BG 235 Cr 0.76 WBC 9.4 Hg 15  CXR: possible bronchitis   Following Medications were ordered in ER: Medications  potassium chloride 10 mEq in 100 mL IVPB (0 mEq Intravenous Stopped 08/08/16 1916)  potassium chloride SA (K-DUR,KLOR-CON) CR tablet 40 mEq (40 mEq Oral Given 08/08/16 1751)      Hospitalist was called for admission for chest pain evaluation and hypokalemia  Review of Systems:    Pertinent positives include: chest pain, shortness of breath at rest. dyspnea on exertion,  productive cough Constitutional:  No weight loss, night sweats, Fevers, chills, fatigue, weight loss  HEENT:  No headaches, Difficulty swallowing,Tooth/dental problems,Sore throat,  No sneezing, itching, ear ache, nasal congestion, post nasal drip,  Cardio-vascular:  No  Orthopnea, PND, anasarca, dizziness, palpitations.no Bilateral lower extremity swelling  GI:  No heartburn, indigestion, abdominal pain, nausea, vomiting, diarrhea, change in bowel habits, loss of appetite, melena, blood in stool, hematemesis Resp:   No excess mucus, no, No non-productive cough, No coughing up of blood. No change in color of mucus.No wheezing. Skin:  no rash or lesions. No  jaundice GU:  no dysuria, change in color of urine, no urgency or frequency. No straining to urinate.  No flank pain.  Musculoskeletal:  No joint pain or no joint swelling. No decreased range of motion. No back pain.  Psych:  No change in mood or affect. No depression or anxiety. No memory loss.  Neuro: no localizing neurological complaints, no tingling, no weakness, no double vision, no gait abnormality, no slurred speech, no confusion  As per HPI otherwise 10 point review of systems negative.   Past Medical History: Past Medical History:  Diagnosis Date  . ADD (attention deficit disorder with hyperactivity)   . Anxiety   . Arthritis    "neck" (06/16/2015)  . Asthma   .  Basal cell carcinoma, face   . Cervical cancer ([place])   . Chronic bronchitis ([place])   . 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 COPD (chronic obstructive pulmonary disease) ([place])   . Depression   . GERD (gastroesophageal reflux disease)   . Hypertension   . On home oxygen therapy    "2L at bedtime" (06/16/2015)  . Pneumonia 1980s X 1  . Squamous cell carcinoma, face   . Thyroid cyst   . Type II diabetes mellitus ([place])    Past Surgical History:  Procedure Laterality Date  . ABDOMINAL HYSTERECTOMY    . ANTERIOR CERVICAL DECOMP/DISCECTOMY FUSION    . BACK SURGERY    . BASAL CELL CARCINOMA EXCISION     "face"  . COLECTOMY    . EXCISIONAL HEMORRHOIDECTOMY    . HERNIA REPAIR  X 3   incisional/ventral hernia repair "in my stomach where the womb is"  . LAPAROSCOPIC CHOLECYSTECTOMY    . OOPHORECTOMY  2006   cervical cancer  . RECTOVAGINAL FISTULA CLOSURE  2012  . SQUAMOUS CELL CARCINOMA EXCISION    . TUBAL LIGATION    . VULVA SURGERY  2006   Cancer in situ  . WRIST SURGERY Right X 1   "accidentially cut it"  . WRIST SURGERY Left    "don't remember why"     Social History:  Ambulatory  Independently     reports that she has been smoking Cigarettes.  She has a 46.00 pack-year smoking history. She has never used smokeless tobacco. She reports that she does not drink alcohol or use drugs.  Allergies:   Allergies  Allergen Reactions  . Contrast Media [Iodinated Diagnostic Agents] Nausea And Vomiting  . Doxycycline Nausea And Vomiting  . Erythromycin Nausea And Vomiting  . Penicillins Itching and Rash    Has patient had a PCN reaction causing immediate rash, facial/tongue/throat swelling, SOB or lightheadedness with hypotension: YES Has patient had a PCN reaction causing severe rash involving mucus membranes or skin necrosis: NO Has patient had a PCN reaction that required hospitalization: NO Has patient had a PCN reaction occurring within the last 10 years: Unknown If all of the  above answers are "NO", then may proceed with Cephalosporin use.   . Metronidazole Hives and Rash       Family History:   Family History  Problem Relation Age of Onset  . COPD Father   . Cancer Father        lung  . Cancer Paternal Aunt        lung    Medications: Prior to Admission medications   Medication Sig Start Date End Date Taking? Authorizing Provider  albuterol (PROVENTIL HFA;VENTOLIN HFA) 108 (90 BASE) MCG/ACT inhaler Inhale 2 puffs into the lungs 2 (  two) times daily as needed for wheezing.   Yes [provider]  alprazolam Duanne Moron) 2 MG tablet Take 0.5 tablets (1 mg total) by mouth at bedtime as needed for sleep or anxiety (Can take up to 3 thimes daily prn). Patient takes 1/2 tablet by mouth four times a day Patient taking differently: Take 1-2 mg by mouth 4 (four) times daily - after meals and at bedtime. Patient takes 1/2 tab three times daily and 1 tab at bedtime 01/05/14  Yes Theodis Blaze, MD  HYDROcodone-acetaminophen [facility]) 10-325 MG tablet Take 1 tablet by mouth every 6 (six) hours as needed for moderate pain.  03/05/15  Yes [provider]  liothyronine (CYTOMEL) 25 MCG tablet Take 25 mcg by mouth every morning. 05/22/16  Yes [provider]  metFORMIN (GLUCOPHAGE) 500 MG tablet Take 500 mg by mouth 2 (two) times daily with a meal.   Yes [provider]  metoprolol tartrate (LOPRESSOR) 25 MG tablet Take 0.5 tablets (12.5 mg total) by mouth 2 (two) times daily. 06/19/15  Yes Eugenie Filler, MD  nefazodone (SERZONE) 100 MG tablet Take 200 mg by mouth 2 (two) times daily. 07/26/16  Yes [provider]  nortriptyline (PAMELOR) 25 MG capsule Take 2 capsules (50 mg total) by mouth at bedtime. Patient taking differently: Take 75 mg by mouth at bedtime.  06/19/15  Yes Eugenie Filler, MD  REXULTI 4 MG TABS Take 4 mg by mouth every morning. 03/11/15  Yes [provider]  albuterol (PROVENTIL) (2.5 MG/3ML) 0.083%  nebulizer solution Take 3 mLs (2.5 mg total) by nebulization every 4 (four) hours as needed for wheezing or shortness of breath. Use nebs 3 times daily x 4 days, then every 4 hours as needed. Patient not taking: Reported on 08/08/2016 06/19/15   Eugenie Filler, MD  budesonide-formoterol [facility]) 160-4.5 MCG/ACT inhaler Inhale 2 puffs into the lungs 2 (two) times daily. Patient not taking: Reported on 08/08/2016 06/19/15   Eugenie Filler, MD  dextromethorphan-guaiFENesin [facility] DM) 30-600 MG 12hr tablet Take 2 tablets by mouth 2 (two) times daily. Take for 4 days then stop. Patient not taking: Reported on 08/08/2016 06/19/15   Eugenie Filler, MD  fluticasone [facility]) 50 MCG/ACT nasal spray Place 2 sprays into both nostrils daily. Take for 5 days then stop. Patient not taking: Reported on 08/08/2016 06/19/15   Eugenie Filler, MD  glipiZIDE (GLUCOTROL) 10 MG tablet Take 10 mg by mouth 2 (two) times daily.     [provider]  levofloxacin (LEVAQUIN) 500 MG tablet Take 1 tablet (500 mg total) by mouth daily at 6 PM. Take for 3 days then stop. Patient not taking: Reported on 08/08/2016 06/19/15   Eugenie Filler, MD  loratadine (CLARITIN) 10 MG tablet Take 1 tablet (10 mg total) by mouth daily. Patient not taking: Reported on 08/08/2016 06/19/15   Eugenie Filler, MD  magnesium oxide (MAG-OX) 400 (241.3 Mg) MG tablet Take 1 tablet (400 mg total) by mouth 2 (two) times daily. For 7 days Patient not taking: Reported on 08/08/2016 06/05/15   Bonnielee Haff, MD  predniSONE (DELTASONE) 20 MG tablet Take 1-3 tablets (20-60 mg total) by mouth daily with breakfast. Take 3 tablets (60mg ) daily x 2 days, then 2 tablets (40mg ) daily x 3 days, then 1 tablet (20mg ) daily x 3 days then stop. Patient not taking: Reported on 08/08/2016 06/20/15   Eugenie Filler, MD  tiotropium (SPIRIVA HANDIHALER) 18 MCG inhalation capsule  Place 1 capsule (18 mcg total) into inhaler and inhale daily. Patient not  taking: Reported on 08/08/2016 06/19/15   Eugenie Filler, MD  venlafaxine XR (EFFEXOR-XR) 150 MG 24 hr capsule Take 150 mg by mouth every morning. 05/13/15   [provider]  Vitamin D, Ergocalciferol, (DRISDOL) 50000 UNITS CAPS Take 50,000 Units by mouth every 7 (seven) days. On thursday    [provider]    Physical Exam: Patient Vitals for the past 24 hrs:  BP Temp Temp src Pulse Resp SpO2  08/08/16 1915 (!) 141/89 - - 90 17 93 %  08/08/16 1900 127/81 - - 88 19 92 %  08/08/16 1845 124/85 - - 86 20 93 %  08/08/16 1830 124/79 - - 87 19 91 %  08/08/16 1815 125/86 - - 86 (!) 22 93 %  08/08/16 1800 (!) 135/92 - - 84 (!) 21 96 %  08/08/16 1730 118/84 - - 88 (!) 21 95 %  08/08/16 1715 118/87 - - 84 (!) 28 95 %  08/08/16 1653 - - - 84 (!) 21 96 %  08/08/16 1642 (!) 137/97 - - 84 - 97 %  08/08/16 1632 (!) 137/97 97.8 F (36.6 C) Oral 82 (!) 26 97 %  08/08/16 1359 123/86 98.8 F (37.1 C) Oral (!) 103 17 96 %    1. General:  in No Acute distress 2. Psychological: Alert and Oriented 3. Head/ENT:     Dry Mucous Membranes                          Head Non traumatic, neck supple                           Poor Dentition 4. SKIN:  decreased Skin turgor,  Skin clean Dry and intact no rash 5. Heart: Regular rate and rhythm no  Murmur, Rub or gallop 6. Lungs:  no wheezes some crackles  Distant breath sounds 7. Abdomen: Soft,  non-tender, Non distended 8. Lower extremities: no clubbing, cyanosis, or edema 9. Neurologically Grossly intact, moving all 4 extremities equally   10. MSK: Normal range of motion   body mass index is unknown because there is no height or weight on file.  Labs on Admission:   Labs on Admission: I have personally reviewed following labs and imaging studies  CBC:  Recent Labs Lab 08/08/16 1358  WBC 9.4  HGB 15.0  HCT 44.6  MCV 86.3  PLT 557   Basic Metabolic Panel:  Recent Labs Lab 08/08/16 1358  NA 135  K 2.6*  CL 95*  CO2 25    GLUCOSE 235*  BUN <5*  CREATININE 0.76  CALCIUM 9.4   GFR: CrCl cannot be calculated (Unknown ideal weight.). Liver Function Tests: No results for input(s): AST, ALT, ALKPHOS, BILITOT, PROT, ALBUMIN in the last 168 hours. No results for input(s): LIPASE, AMYLASE in the last 168 hours. No results for input(s): AMMONIA in the last 168 hours. Coagulation Profile: No results for input(s): INR, PROTIME in the last 168 hours. Cardiac Enzymes: No results for input(s): CKTOTAL, CKMB, CKMBINDEX, TROPONINI in the last 168 hours. BNP (last 3 results) No results for input(s): PROBNP in the last 8760 hours. HbA1C: No results for input(s): HGBA1C in the last 72 hours. CBG: No results for input(s): GLUCAP in the last 168 hours. Lipid Profile: No results for input(s): CHOL, HDL, LDLCALC, TRIG, CHOLHDL, LDLDIRECT in  the last 72 hours. Thyroid Function Tests: No results for input(s): TSH, T4TOTAL, FREET4, T3FREE, THYROIDAB in the last 72 hours. Anemia Panel: No results for input(s): VITAMINB12, FOLATE, FERRITIN, TIBC, IRON, RETICCTPCT in the last 72 hours. Urine analysis:  Sepsis Labs: @LABRCNTIP (procalcitonin:4,lacticidven:4) )No results found for this or any previous visit (from the past 240 hour(s)).    UA  ordered  Lab Results  Component Value Date   HGBA1C 6.6 (H) 06/05/2015    CrCl cannot be calculated (Unknown ideal weight.).  BNP (last 3 results) No results for input(s): PROBNP in the last 8760 hours.   ECG REPORT  Independently reviewed Rate:100  Rhythm: SR w PVC ST&T Change:St depression multiple leads QTC 451  There were no vitals filed for this visit.   Cultures:    Component Value Date/Time   SDES BLOOD RIGHT HAND 06/16/2015 2200   SPECREQUEST BOTTLES DRAWN AEROBIC AND ANAEROBIC 5CC  06/16/2015 2200   CULT NO GROWTH 5 DAYS 06/16/2015 2200   REPTSTATUS 06/21/2015 FINAL 06/16/2015 2200     Radiological Exams on Admission: Dg Chest 2 View  Result Date:  08/08/2016 CLINICAL DATA:  Chest pain. EXAM: CHEST  2 VIEW COMPARISON:  06/17/2015. FINDINGS: Mediastinum and hilar structures normal. Mild peribronchial cuffing. Bronchitis cannot be excluded. Mild bibasilar subsegmental atelectasis. No pleural effusion or pneumothorax. Heart size normal. No acute bony abnormality. Cervical spine fusion. IMPRESSION: Mild peribronchial cuffing. Bronchitis can't be excluded . Mild bibasilar subsegmental atelectasis. Otherwise negative exam. Electronically Signed   By: Marcello Moores  Register   On: 08/08/2016 14:44    Chart has been reviewed    Assessment/Plan   61 y.o. female with medical history significant of DM 2, HTN,  COPD on home oxygen 2 L at bedtime, asthma, GERD, depression, anxiety, ADHD, hyperlipidemia, tobacco abuse, chronic diastolic CHF  Admitted for chest pain evaluation and hypokalemia  Present on Admission:  . COPD (chronic obstructive pulmonary disease)   with exacerbation -  -  - Will initiate: Steroid taper  -  Antibiotics   Doxycycline, - Albuterol PRN, - scheduled duoneb,  -  Breo or Dulera at discharge   -  Mucinex.  Titrate O2 to saturation >90%. Follow patients respiratory status.  Spoke about importance of quitting smoking  Currently mentating well no evidence of symptomatic hypercarbia  . Hypokalemia - - will replace and repeat in AM,  check magnesium level and replace as needed  . Hypertension  able continue metoprolol . HLD (hyperlipidemia) check lipid panel in a.m. Marland Kitchen Depression stable continue home medication  . Chest pain most likely musculoskeletal secondary to coughing and bronchitis   . Chronic diastolic CHF (congestive heart failure) (HCC) currently appears to be somewhat on the dry side or gently rehydrate been aware of fluid status  . Tobacco abuse spoke about importance of quitting order nicotine patch  Other plan as per orders.  DVT prophylaxis:   Lovenox     Code Status:  FULL CODE   as per patient    Family  Communication:   Family not at  Bedside    Disposition Plan:   To home once workup is complete and patient is stable                              Consults called: none  Admission status:    obs   Level of care           tele  I have spent a total of 56 min on this admission   [name] 08/08/2016, 10:23 PM   [facility]  Pager [phone]   after 2 AM please page floor coverage PA If 7AM-7PM, please contact the day team taking care of the patient  [email or web address]  Password [email or web address]

## 2016-08-09 ENCOUNTER — Other Ambulatory Visit: Payer: Self-pay

## 2016-08-09 DIAGNOSIS — E119 Type 2 diabetes mellitus without complications: Secondary | ICD-10-CM

## 2016-08-09 DIAGNOSIS — J441 Chronic obstructive pulmonary disease with (acute) exacerbation: Secondary | ICD-10-CM | POA: Diagnosis not present

## 2016-08-09 DIAGNOSIS — J440 Chronic obstructive pulmonary disease with acute lower respiratory infection: Secondary | ICD-10-CM | POA: Diagnosis not present

## 2016-08-09 DIAGNOSIS — J40 Bronchitis, not specified as acute or chronic: Secondary | ICD-10-CM

## 2016-08-09 DIAGNOSIS — R079 Chest pain, unspecified: Secondary | ICD-10-CM | POA: Diagnosis not present

## 2016-08-09 DIAGNOSIS — F17200 Nicotine dependence, unspecified, uncomplicated: Secondary | ICD-10-CM | POA: Diagnosis not present

## 2016-08-09 DIAGNOSIS — J209 Acute bronchitis, unspecified: Secondary | ICD-10-CM | POA: Diagnosis not present

## 2016-08-09 LAB — LIPID PANEL
Cholesterol: 174 mg/dL (ref 0–200)
HDL: 38 mg/dL — ABNORMAL LOW (ref >40)
LDL CALC: 109 mg/dL — AB (ref 0–99)
Total CHOL/HDL Ratio: 4.6 RATIO
Triglycerides: 135 mg/dL (ref <150)
VLDL: 27 mg/dL (ref 0–40)

## 2016-08-09 LAB — COMPREHENSIVE METABOLIC PANEL
ALT: 13 U/L — ABNORMAL LOW (ref 14–54)
ANION GAP: 7 (ref 5–15)
AST: 20 U/L (ref 15–41)
Albumin: 3.5 g/dL (ref 3.5–5.0)
Alkaline Phosphatase: 61 U/L (ref 38–126)
BUN: <5 mg/dL — ABNORMAL LOW (ref 6–20)
CHLORIDE: 98 mmol/L — AB (ref 101–111)
CO2: 31 mmol/L (ref 22–32)
Calcium: 9.1 mg/dL (ref 8.9–10.3)
Creatinine, Ser: 0.71 mg/dL (ref 0.44–1.00)
GFR calc non Af Amer: >60 mL/min (ref >60)
Glucose, Bld: 209 mg/dL — ABNORMAL HIGH (ref 65–99)
Potassium: 2.9 mmol/L — ABNORMAL LOW (ref 3.5–5.1)
SODIUM: 136 mmol/L (ref 135–145)
Total Bilirubin: 0.6 mg/dL (ref 0.3–1.2)
Total Protein: 6.7 g/dL (ref 6.5–8.1)

## 2016-08-09 LAB — CBC
HCT: 42.7 % (ref 36.0–46.0)
HEMOGLOBIN: 14 g/dL (ref 12.0–15.0)
MCH: 28.8 pg (ref 26.0–34.0)
MCHC: 32.8 g/dL (ref 30.0–36.0)
MCV: 87.9 fL (ref 78.0–100.0)
Platelets: 227 10*3/uL (ref 150–400)
RBC: 4.86 MIL/uL (ref 3.87–5.11)
RDW: 12.6 % (ref 11.5–15.5)
WBC: 9 10*3/uL (ref 4.0–10.5)

## 2016-08-09 LAB — MAGNESIUM: MAGNESIUM: 1.5 mg/dL — AB (ref 1.7–2.4)

## 2016-08-09 LAB — PHOSPHORUS: Phosphorus: 2.1 mg/dL — ABNORMAL LOW (ref 2.5–4.6)

## 2016-08-09 LAB — GLUCOSE, CAPILLARY
GLUCOSE-CAPILLARY: 211 mg/dL — AB (ref 65–99)
Glucose-Capillary: 203 mg/dL — ABNORMAL HIGH (ref 65–99)

## 2016-08-09 LAB — TSH: TSH: 0.094 u[IU]/mL — AB (ref 0.350–4.500)

## 2016-08-09 LAB — TROPONIN I
Troponin I: <0.03 ng/mL (ref <0.03)
Troponin I: <0.03 ng/mL (ref <0.03)

## 2016-08-09 MED ORDER — ALBUTEROL SULFATE (2.5 MG/3ML) 0.083% IN NEBU: 2.5000 mg | INHALATION_SOLUTION | RESPIRATORY_TRACT | Status: DC | PRN

## 2016-08-09 MED ORDER — NICOTINE 21 MG/24HR TD PT24: 21.0000 mg | MEDICATED_PATCH | Freq: Every day | TRANSDERMAL | 0 refills | Status: DC

## 2016-08-09 MED ORDER — POTASSIUM CHLORIDE CRYS ER 20 MEQ PO TBCR
20.0000 meq | EXTENDED_RELEASE_TABLET | Freq: Every day | ORAL | Status: DC
  Administered 2016-08-09: 20 meq via ORAL
  Filled 2016-08-09: qty 1

## 2016-08-09 MED ORDER — PREDNISONE 10 MG PO TABS: ORAL_TABLET | ORAL | 0 refills | Status: DC

## 2016-08-09 MED ORDER — POTASSIUM CHLORIDE CRYS ER 20 MEQ PO TBCR: 20.0000 meq | EXTENDED_RELEASE_TABLET | Freq: Every day | ORAL | 0 refills | Status: DC

## 2016-08-09 MED ORDER — LEVOFLOXACIN 500 MG PO TABS: 500.0000 mg | ORAL_TABLET | Freq: Every day | ORAL | 0 refills | Status: AC

## 2016-08-09 MED ORDER — IPRATROPIUM-ALBUTEROL 0.5-2.5 (3) MG/3ML IN SOLN
3.0000 mL | Freq: Three times a day (TID) | RESPIRATORY_TRACT | Status: DC
  Administered 2016-08-09: 3 mL via RESPIRATORY_TRACT
  Filled 2016-08-09: qty 3

## 2016-08-09 MED ORDER — ALBUTEROL SULFATE (2.5 MG/3ML) 0.083% IN NEBU: 2.5000 mg | INHALATION_SOLUTION | RESPIRATORY_TRACT | 12 refills | Status: DC | PRN

## 2016-08-09 NOTE — Progress Notes (Signed)
Patient called nurse into the room to state that she found a live tick on her leg, tick contained in specimen cup and environmental called. Nurse searched patient thoroughly to ensure that there were not any more ticks on the patient, none were found. Complete linen changed as well as patient's gown. EVS called to clean patient's room after linen removal. Patient in good spirits.  Neta Mends RN 5:17 PM 08-09-2016

## 2016-08-09 NOTE — Progress Notes (Signed)
Inpatient Diabetes Program Recommendations  AACE/ADA: New Consensus Statement on Inpatient Glycemic Control (2015)  Target Ranges:  Prepandial:   less than 140 mg/dL      Peak postprandial:   less than 180 mg/dL (1-2 hours)      Critically ill patients:  140 - 180 mg/dL   Lab Results  Component Value Date   GLUCAP 211 (H) 08/09/2016   HGBA1C 6.6 (H) 06/05/2015    Review of Glycemic ControlResults for EMON, MIGGINS (MRN [id number]) as of 08/09/2016 09:24  Ref. Range 08/08/2016 23:00 08/09/2016 08:01  Glucose-Capillary Latest Ref Range: 65 - 99 mg/dL 236 (H) 211 (H)    Diabetes history: Type 2 diabetes Outpatient Diabetes medications: Glucotrol 10 mg bid, Pred. Taper, Metformin 500 mg bid Current orders for Inpatient glycemic control: Novolog moderate tid with meals and HS, Prednisone 40 mg daily  Inpatient Diabetes Program Recommendations:   Please consider adding Lantus 14 units daily while patient is in hospital.   Also please consider checking A1C.   Thanks, Adah Perl, RN, BC-ADM Inpatient Diabetes Coordinator Pager [phone] (8a-5p)

## 2016-08-09 NOTE — Progress Notes (Signed)
Tech assisted Pt with bath. Tech noticed that Pt had rolled up toilet tissue, using it as a dressing on right groin area. The tissue had blood on it. Tech told Pt that she would let the RN know about the wound.

## 2016-08-09 NOTE — Discharge Summary (Addendum)
[name], is a 61 y.o. female  DOB Aug 17, 1955  MRN [id number].  Admission date:  08/08/2016  Admitting Physician  Toy Baker, MD  Discharge Date:  08/09/2016   Primary MD  Elwyn Reach, MD  Recommendations for primary care physician for things to follow:   Bronchitis, mild Copd exacerbation Prednisone 40mg  po qday x2 days then .....10mg  po qday x 2 days Finish Levaquin 500mg  po qday x 3 days Cont symbicort, cont spiriva, cont albuterol  Tobacco use Nicotine patch.   Cp resolved, likely related to cough.  Please f/u with pcp  Hypokalemia Started Kcl 20 meq po qday Needs bmp in 1 week.   Dm2 pcp to please f/u on Hga1c  Continue home medications     Admission Diagnosis  Chest pain, unspecified type [R07.9]   Discharge Diagnosis  Chest pain, unspecified type [R07.9]    Active Problems:   Bronchitis   Chest pain   Depression   Hypertension   Diabetes mellitus without complication (HCC)   HLD (hyperlipidemia)   COPD exacerbation (HCC)   Hypokalemia   Tobacco abuse   Chronic diastolic CHF (congestive heart failure) ([place])      Past Medical History:  Diagnosis Date  . ADD (attention deficit disorder with hyperactivity)   . Anxiety   . Arthritis    "neck" (06/16/2015)  . Asthma   . Basal cell carcinoma, face   . Cervical cancer ([place])   . Chronic bronchitis ([place])   . 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 COPD (chronic obstructive pulmonary disease) ([place])   . Depression   . GERD (gastroesophageal reflux disease)   . Hypertension   . On home oxygen therapy    "2L at bedtime" (06/16/2015)  . Pneumonia 1980s X 1  . Squamous cell carcinoma, face   . Thyroid cyst   . Type II diabetes mellitus ([place])     Past Surgical History:  Procedure Laterality Date  . ABDOMINAL HYSTERECTOMY    . ANTERIOR CERVICAL DECOMP/DISCECTOMY FUSION    . BACK SURGERY    . BASAL CELL  CARCINOMA EXCISION     "face"  . COLECTOMY    . EXCISIONAL HEMORRHOIDECTOMY    . HERNIA REPAIR  X 3   incisional/ventral hernia repair "in my stomach where the womb is"  . LAPAROSCOPIC CHOLECYSTECTOMY    . OOPHORECTOMY  2006   cervical cancer  . RECTOVAGINAL FISTULA CLOSURE  2012  . SQUAMOUS CELL CARCINOMA EXCISION    . TUBAL LIGATION    . VULVA SURGERY  2006   Cancer in situ  . WRIST SURGERY Right X 1   "accidentially cut it"  . WRIST SURGERY Left    "don't remember why"       HPI  from the history and physical done on the day of admission:   60 y.o. female with medical history significant of DM 2, HTN,  COPD on home oxygen 2 L at bedtime, asthma, GERD, depression, anxiety, ADHD, hyperlipidemia, tobacco abuse, chronic  diastolic CHF  Presented with left-sided chest pain and some shortness of breath associated that has had some cough for past few days she doesn't known history of COPD have had some sputum production tryed to use her inhaler before with some  Improvement. She is no longer on Symbicort. Still smokes 1 pack a day. States she may have to quit.  Reports some wheezing. Cough productive of yellowish sputum. No leg swelling. She has not traveled no leg swelling. Sometimes hurts with respirations or cough. She denies any fever pain feels sharp. Is ambulating in the emergency department was able to walk 40 feet without desaturation.     Regarding pertinent Chronic problems: Known history of asthma and COPD with frequent admissions for past few years. Last echogram done in April 2017 preserved EF grade 1 diastolic dysfunction  Patient have had recurrent episodes of hypokalemia and hypomagnesemia unclear etiology.  Patient has history of diabetes reports poor compliance  IN ER:  Temp (24hrs), Avg:98.3 F (36.8 C), Min:97.8 F (36.6 C), Max:98.8 F (37.1 C)      on arrival      ED Triage Vitals  Enc Vitals Group     BP 08/08/16 1359 123/86     Pulse Rate 08/08/16 1359  (!) 103     Resp 08/08/16 1359 17     Temp 08/08/16 1359 98.8 F (37.1 C)     Temp Source 08/08/16 1359 Oral     SpO2 08/08/16 1359 96 %     Weight --      Height --      Head Circumference --      Peak Flow --      Pain Score 08/08/16 1357 7     Pain Loc --      Pain Edu? --      Excl. in [place]? --     RR 17 93% HR 90 BP 141/89 D.dimer 0.45 Trop 0.00 K 2.6 BG 235 Cr 0.76 WBC 9.4 Hg 15  CXR: possible bronchitis   Following Medications were ordered in ER: Medications  potassium chloride 10 mEq in 100 mL IVPB (0 mEq Intravenous Stopped 08/08/16 1916)  potassium chloride SA (K-DUR,KLOR-CON) CR tablet 40 mEq (40 mEq Oral Given 08/08/16 1751)      Hospitalist was called for admission for chest pain evaluation and hypokalemia        Hospital Course:      Pt states feeling much better after admission and treatment with prednisone and doxycycline, and breathing treatments.  CXR => bronchitis.  Pt states only had cp without cough.  None presently since cough has improved.  Trop I x3 negative.  Pt is feeling much better and would like to go home.       Follow UP     Consults obtained - none  Discharge Condition: stable  Diet and Activity recommendation: See Discharge Instructions below  Discharge Instructions       Discharge Medications     Allergies as of 08/09/2016      Reactions   Contrast Media [iodinated Diagnostic Agents] Nausea And Vomiting   Doxycycline Nausea And Vomiting   Erythromycin Nausea And Vomiting   Penicillins Itching, Rash   Has patient had a PCN reaction causing immediate rash, facial/tongue/throat swelling, SOB or lightheadedness with hypotension: YES Has patient had a PCN reaction causing severe rash involving mucus membranes or skin necrosis: NO Has patient had a PCN reaction that required hospitalization: NO Has patient had a PCN reaction  occurring within the last 10 years: Unknown If all of the above answers are "NO",  then may proceed with Cephalosporin use.   Metronidazole Hives, Rash      Medication List    TAKE these medications   albuterol 108 (90 Base) MCG/ACT inhaler Commonly known as:  PROVENTIL HFA;VENTOLIN HFA Inhale 2 puffs into the lungs 2 (two) times daily as needed for wheezing. What changed:  Another medication with the same name was changed. Make sure you understand how and when to take each.   albuterol (2.5 MG/3ML) 0.083% nebulizer solution Commonly known as:  PROVENTIL Take 3 mLs (2.5 mg total) by nebulization every 4 (four) hours as needed for wheezing or shortness of breath. What changed:  additional instructions   alprazolam 2 MG tablet Commonly known as:  XANAX Take 0.5 tablets (1 mg total) by mouth at bedtime as needed for sleep or anxiety (Can take up to 3 thimes daily prn). Patient takes 1/2 tablet by mouth four times a day What changed:  how much to take  when to take this  additional instructions   budesonide-formoterol 160-4.5 MCG/ACT inhaler Commonly known as:  SYMBICORT Inhale 2 puffs into the lungs 2 (two) times daily.   dextromethorphan-guaiFENesin 30-600 MG 12hr tablet Commonly known as:  MUCINEX DM Take 2 tablets by mouth 2 (two) times daily. Take for 4 days then stop.   fluticasone 50 MCG/ACT nasal spray Commonly known as:  FLONASE Place 2 sprays into both nostrils daily. Take for 5 days then stop.   glipiZIDE 10 MG tablet Commonly known as:  GLUCOTROL Take 10 mg by mouth 2 (two) times daily.   HYDROcodone-acetaminophen 10-325 MG tablet Commonly known as:  NORCO Take 1 tablet by mouth every 6 (six) hours as needed for moderate pain.   levofloxacin 500 MG tablet Commonly known as:  LEVAQUIN Take 1 tablet (500 mg total) by mouth daily. What changed:  when to take this  additional instructions   liothyronine 25 MCG tablet Commonly known as:  CYTOMEL Take 25 mcg by mouth every morning.   loratadine 10 MG tablet Commonly known as:   CLARITIN Take 1 tablet (10 mg total) by mouth daily.   magnesium oxide 400 (241.3 Mg) MG tablet Commonly known as:  MAG-OX Take 1 tablet (400 mg total) by mouth 2 (two) times daily. For 7 days   metFORMIN 500 MG tablet Commonly known as:  GLUCOPHAGE Take 500 mg by mouth 2 (two) times daily with a meal.   metoprolol tartrate 25 MG tablet Commonly known as:  LOPRESSOR Take 0.5 tablets (12.5 mg total) by mouth 2 (two) times daily.   nefazodone 100 MG tablet Commonly known as:  SERZONE Take 200 mg by mouth 2 (two) times daily.   nicotine 21 mg/24hr patch Commonly known as:  NICODERM CQ - dosed in mg/24 hours Place 1 patch (21 mg total) onto the skin daily. Start taking on:  08/10/2016   nortriptyline 25 MG capsule Commonly known as:  PAMELOR Take 2 capsules (50 mg total) by mouth at bedtime. What changed:  how much to take   potassium chloride SA 20 MEQ tablet Commonly known as:  K-DUR,KLOR-CON Take 1 tablet (20 mEq total) by mouth daily.   predniSONE 10 MG tablet Commonly known as:  DELTASONE 40mg  po qday x 2 days then 30mg  po qday x 2 days then 20mg  po qday x 2 days then 10mg  po qday x 2 days. What changed:  medication strength  how much  to take  how to take this  when to take this  additional instructions   REXULTI 4 MG Tabs Generic drug:  Brexpiprazole Take 4 mg by mouth every morning.   tiotropium 18 MCG inhalation capsule Commonly known as:  SPIRIVA HANDIHALER Place 1 capsule (18 mcg total) into inhaler and inhale daily.   venlafaxine XR 150 MG 24 hr capsule Commonly known as:  EFFEXOR-XR Take 150 mg by mouth every morning.   Vitamin D (Ergocalciferol) 50000 units Caps capsule Commonly known as:  DRISDOL Take 50,000 Units by mouth every 7 (seven) days. On thursday       Major procedures and Radiology Reports - PLEASE review detailed and final reports for all details, in brief -      Dg Chest 2 View  Result Date: 08/08/2016 CLINICAL DATA:   Chest pain. EXAM: CHEST  2 VIEW COMPARISON:  06/17/2015. FINDINGS: Mediastinum and hilar structures normal. Mild peribronchial cuffing. Bronchitis cannot be excluded. Mild bibasilar subsegmental atelectasis. No pleural effusion or pneumothorax. Heart size normal. No acute bony abnormality. Cervical spine fusion. IMPRESSION: Mild peribronchial cuffing. Bronchitis can't be excluded . Mild bibasilar subsegmental atelectasis. Otherwise negative exam. Electronically Signed   By: Marcello Moores  Register   On: 08/08/2016 14:44    Micro Results     No results found for this or any previous visit (from the past 240 hour(s)).     Today   Subjective    [name] today has no dyspnea.  Pt doing well.  Minimal cough.  Dry.   headache,no chest abdominal pain,no new weakness tingling or numbness, feels much better wants to go home today.    Objective   Blood pressure 116/70, pulse 80, temperature 98 F (36.7 C), temperature source Oral, resp. rate 20, height 5\' 3"  (1.6 m), weight 72.6 kg (160 lb), SpO2 97 %.   Intake/Output Summary (Last 24 hours) at 08/09/16 1813 Last data filed at 08/09/16 1500  Gross per 24 hour  Intake             1445 ml  Output             1525 ml  Net              -80 ml    Exam Awake Alert, Oriented x 3, No new F.N deficits, Normal affect [place].AT,PERRAL Supple Neck,No JVD, No cervical lymphadenopathy appriciated.  Symmetrical Chest wall movement, Good air movement bilaterally, CTAB RRR,No Gallops,Rubs or new Murmurs, No Parasternal Heave +ve B.Sounds, Abd Soft, Non tender, No organomegaly appriciated, No rebound -guarding or rigidity. No Cyanosis, Clubbing or edema, No new Rash or bruise   Data Review   CBC w Diff:  Lab Results  Component Value Date   WBC 9.0 08/09/2016   HGB 14.0 08/09/2016   HCT 42.7 08/09/2016   PLT 227 08/09/2016   LYMPHOPCT 37 06/16/2015   MONOPCT 7 06/16/2015   EOSPCT 1 06/16/2015   BASOPCT 1 06/16/2015    CMP:  Lab Results    Component Value Date   NA 136 08/09/2016   K 2.9 (L) 08/09/2016   CL 98 (L) 08/09/2016   CO2 31 08/09/2016   BUN <5 (L) 08/09/2016   CREATININE 0.71 08/09/2016   PROT 6.7 08/09/2016   ALBUMIN 3.5 08/09/2016   BILITOT 0.6 08/09/2016   ALKPHOS 61 08/09/2016   AST 20 08/09/2016   ALT 13 (L) 08/09/2016  .   Total Time in preparing paper work, data evaluation and todays exam -  35 minutes  Jani Gravel M.D on 08/09/2016 at [place] PM  [facility]   Office  [phone]

## 2016-08-10 LAB — HEMOGLOBIN A1C
Hgb A1c MFr Bld: 7.8 % — ABNORMAL HIGH (ref 4.8–5.6)
Hgb A1c MFr Bld: 7.7 % — ABNORMAL HIGH (ref 4.8–5.6)
Mean Plasma Glucose: 177 mg/dL
Mean Plasma Glucose: 174 mg/dL

## 2016-08-10 LAB — HIV ANTIBODY (ROUTINE TESTING W REFLEX): HIV Screen 4th Generation wRfx: NONREACTIVE

## 2016-10-12 ENCOUNTER — Encounter: Payer: Self-pay | Admitting: Internal Medicine

## 2016-10-12 ENCOUNTER — Ambulatory Visit (INDEPENDENT_AMBULATORY_CARE_PROVIDER_SITE_OTHER): Payer: Medicare Other | Admitting: Internal Medicine

## 2016-10-12 VITALS — BP 136/84 | HR 83 | Ht 65.0 in | Wt 150.0 lb

## 2016-10-12 DIAGNOSIS — F17210 Nicotine dependence, cigarettes, uncomplicated: Secondary | ICD-10-CM | POA: Diagnosis not present

## 2016-10-12 DIAGNOSIS — J449 Chronic obstructive pulmonary disease, unspecified: Secondary | ICD-10-CM

## 2016-10-12 MED ORDER — FLUTTER DEVI: 0 refills | Status: DC

## 2016-10-12 MED ORDER — BUDESONIDE-FORMOTEROL FUMARATE 160-4.5 MCG/ACT IN AERO: 2.0000 | INHALATION_SPRAY | Freq: Two times a day (BID) | RESPIRATORY_TRACT | 0 refills | Status: DC

## 2016-10-12 NOTE — Patient Instructions (Signed)
Plan A = Automatic = symbicort 160 Take 2 puffs first thing in am and then another 2 puffs about 12 hours later.    Work on inhaler technique:  relax and gently blow all the way out then take a nice smooth deep breath back in, triggering the inhaler at same time you start breathing in.  Hold for up to 5 seconds if you can. Blow out thru nose. Rinse and gargle with water when done      Plan B = Backup Only use your albuterol as a rescue medication to be used if you can't catch your breath by resting or doing a relaxed purse lip breathing pattern.  - The less you use it, the better it will work when you need it. - Ok to use the inhaler up to 2 puffs  every 4 hours if you must but call for appointment if use goes up over your usual need - Don't leave home without it !!  (think of it like the spare tire for your car)   Plan C = Crisis - only use your albuterol nebulizer if you first try Plan B and it fails to help > ok to use the nebulizer up to every 4 hours but if start needing it regularly call for immediate appointment   For cough > mucinex or mucinex dm up to 1200 mg every 12 hours and use the flutter valve as much as possible   The key is to stop smoking completely before smoking completely stops you!    If you are satisfied with your treatment plan,  let your doctor know and he/she can either refill your medications or you can return here when your prescription runs out.     If in any way you are not 100% satisfied,  please tell us.  If 100% better, tell your friends!  Pulmonary follow up is as needed

## 2016-10-12 NOTE — Assessment & Plan Note (Signed)

## 2016-10-12 NOTE — Progress Notes (Signed)
Subjective:     Patient ID: [name], female   DOB: Sep 15, 1955,    MRN: [id number]  HPI  57 yowf active smoker with onset of cough x 2010 that has waxed and waned since so referred to pulmonary clinic 10/12/2016 by Dr   Jonelle Sidle   10/12/2016 1st [facility] office visit/ Russell Engelstad   Chief Complaint  Patient presents with  . Pulmonary Consult    Referred by Dr. Gala Romney.  Pt states dxed with COPD "years ago".  She states that she gets SOB walking approx 8 ft. She is coughing-non prod. States that she has a "cold".    cough is worse x last 2 d prior to OV  / not producing mucus but very rattly and worse first thing in am but does ok noct  Very confused with names of meds / very poor hfa technique  Doe = MMRC3 = can't walk 100 yards even at a slow pace at a flat grade s stopping due to sob     No obvious day to day or daytime variability or assoc excess/ purulent sputum or mucus plugs or hemoptysis or cp or chest tightness, subjective wheeze or overt sinus or hb symptoms. No unusual exp hx or h/o childhood pna/ asthma or knowledge of premature birth.  Sleeping ok without nocturnal  or early am exacerbation  of respiratory  c/o's or need for noct saba. Also denies any obvious fluctuation of symptoms with weather or environmental changes or other aggravating or alleviating factors except as outlined above   Current Medications, Allergies, Complete Past Medical History, Past Surgical History, Family History, and Social History were reviewed in [employer] record.  ROS  The following are not active complaints unless bolded sore throat, dysphagia, dental problems, itching, sneezing,  nasal congestion or excess/ purulent secretions, ear ache,   fever, chills, sweats, unintended wt loss, classically pleuritic or exertional cp,  orthopnea pnd or leg swelling, presyncope, palpitations, abdominal pain, anorexia, nausea, vomiting, diarrhea  or change in bowel or bladder  habits, change in stools or urine, dysuria,hematuria,  rash, arthralgias, visual complaints, headache, numbness, weakness or ataxia or problems with walking or coordination,  change in mood/affect or memory.           Review of Systems     Objective:   Physical Exam    amb wf nad looks much > stated age/ chronically ill    Wt Readings from Last 3 Encounters:  10/12/16 150 lb (68 kg)  08/09/16 160 lb (72.6 kg)  06/17/15 154 lb 12.8 oz (70.2 kg)    Vital signs reviewed - Note on arrival 02 sats  96% on RA     HEENT: nl  turbinates bilaterally, and oropharynx.   external ear canals impacted/obst with wax  without cough reflex - edentulous with mild thrush   NECK :  without JVD/Nodes/TM/ nl carotid upstrokes bilaterally   LUNGS: no acc muscle use,  Nl contour chest with junky sym mid exp rhonchi bilaterally   CV:  RRR  no s3 or murmur or increase in P2, and no edema   ABD:  soft and nontender with nl inspiratory excursion in the supine position. No bruits or organomegaly appreciated, bowel sounds nl  MS:  Nl gait/ ext warm without deformities, calf tenderness, cyanosis or clubbing No obvious joint restrictions   SKIN: warm and dry without lesions    NEURO:  Alert,  nl sensorium def slowed mentation with  no motor  or cerebellar deficits apparent.       I personally reviewed images and agree with radiology impression as follows:  CXR:   08/08/16 Mild peribronchial cuffing. Bronchitis can't be excluded . Mild bibasilar subsegmental atelectasis. Otherwise negative exam.     Assessment:

## 2016-10-12 NOTE — Addendum Note (Signed)
Addended by: Rosana Berger on: 10/12/2016 11:43 AM   Modules accepted: Orders

## 2016-10-12 NOTE — Assessment & Plan Note (Addendum)
Spirometry 10/12/2016  FEV1 1.31 (50%)  Ratio 59 p nothing prior - 10/12/2016  After extensive coaching HFA effectiveness =    50% from a baseline of 10% > continue symb 160 2bid   - 10/12/2016   Walked RA x one lap @ 185 stopped due to  Sob/ ok sats    Adherence is always the initial "prime suspect" and is a multilayered concern that requires a "trust but verify" approach in every patient - starting with knowing how to use medications, especially inhalers, correctly, keeping up with refills and understanding the fundamental difference between maintenance and prns vs those medications only taken for a very short course and then stopped and not refilled.  - see hfa teaching, consider change to trelegy if not able to master hfa but then will need dpi saba = respiclick proair  Active smoking (see separate a/p)   ? Anxiety > usually at the bottom of this list of usual suspects but should be much higher on this pt's based on H and P and note already on psychotropics / def a challenge in terms of adherence and ability to cope s cigs  ? Allergy/asthmatic component > best rx is high dose ics = symbicort 160    ? Bronchiectasis > moot issue for now, add flutter prn to help with cough  ? Beta blocker > probably not a factor on low dose lopressor    ? chf > at risk of cor pulmonale by last echo reviewed 05/2015 but no edema on exam/ rx is treat copd /02 prn which se did not qualify for today so we'll see her back prn    Nothing to offer from specialty care unless / until able to do better med reconciliation and make a commit to quit and in meantime we can see her here as needed   Total time devoted to counseling  > 50 % of initial 60 min office visit:  review case with pt/ grand daughter discussion of options/alternatives/ personally creating written customized instructions  in presence of pt  then going over those specific  Instructions directly with the pt including how to use all of the meds but in  particular covering each new medication in detail and the difference between the maintenance= "automatic" meds and the prns using an action plan format for the latter (If this problem/symptom => do that organization reading Left to right).  Please see AVS from this visit for a full list of these instructions which I personally wrote for this pt and  are unique to this visit.

## 2016-10-17 ENCOUNTER — Telehealth: Payer: Self-pay | Admitting: *Deleted

## 2016-10-17 NOTE — Telephone Encounter (Signed)
-----   Message from Tanda Rockers, MD sent at 10/14/2016  6:46 AM EDT ----- I reviewed Dr Leontine Locket last note says shes on lisinopril - if this is the case needs to stop in and satart losartan 100 mg daily until returns here w/in next 6 weeks

## 2016-10-17 NOTE — Telephone Encounter (Signed)
ATC pt msg states "the wireless customer you have called is unavailable" [facility]

## 2016-10-24 NOTE — Telephone Encounter (Signed)
ATC again, her number is still unavailable

## 2016-10-26 NOTE — Telephone Encounter (Signed)
Tried to contact pt again and pt's number is still unavailable.

## 2016-12-26 IMAGING — DX DG CHEST 2V
2 series · 2 of 2 positions shown · non-contrast
Comparison: 06/04/2015

CLINICAL DATA: Cough, congestion for 1 week

EXAM:
CHEST  2 VIEW

[w chest lat]
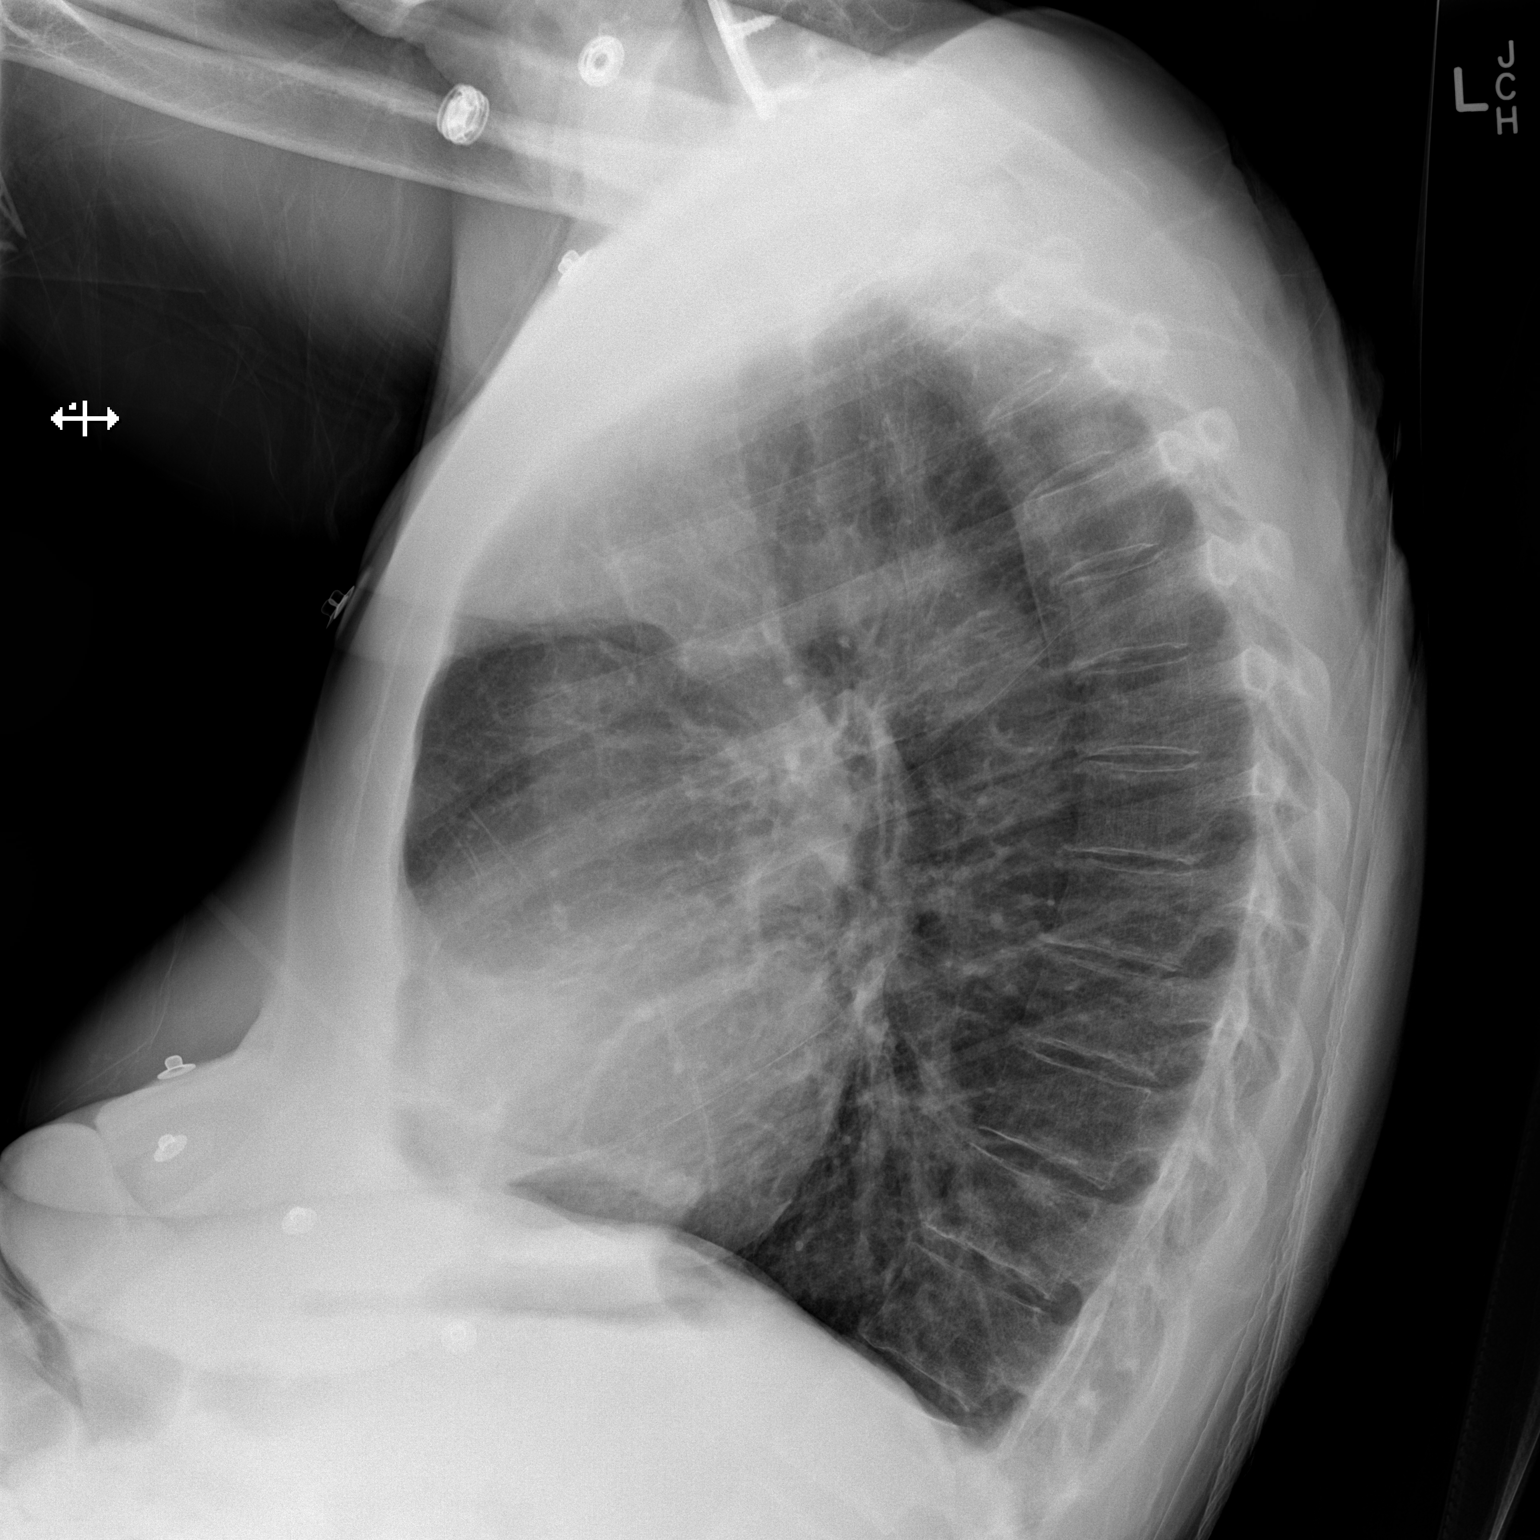

[x chest ap]
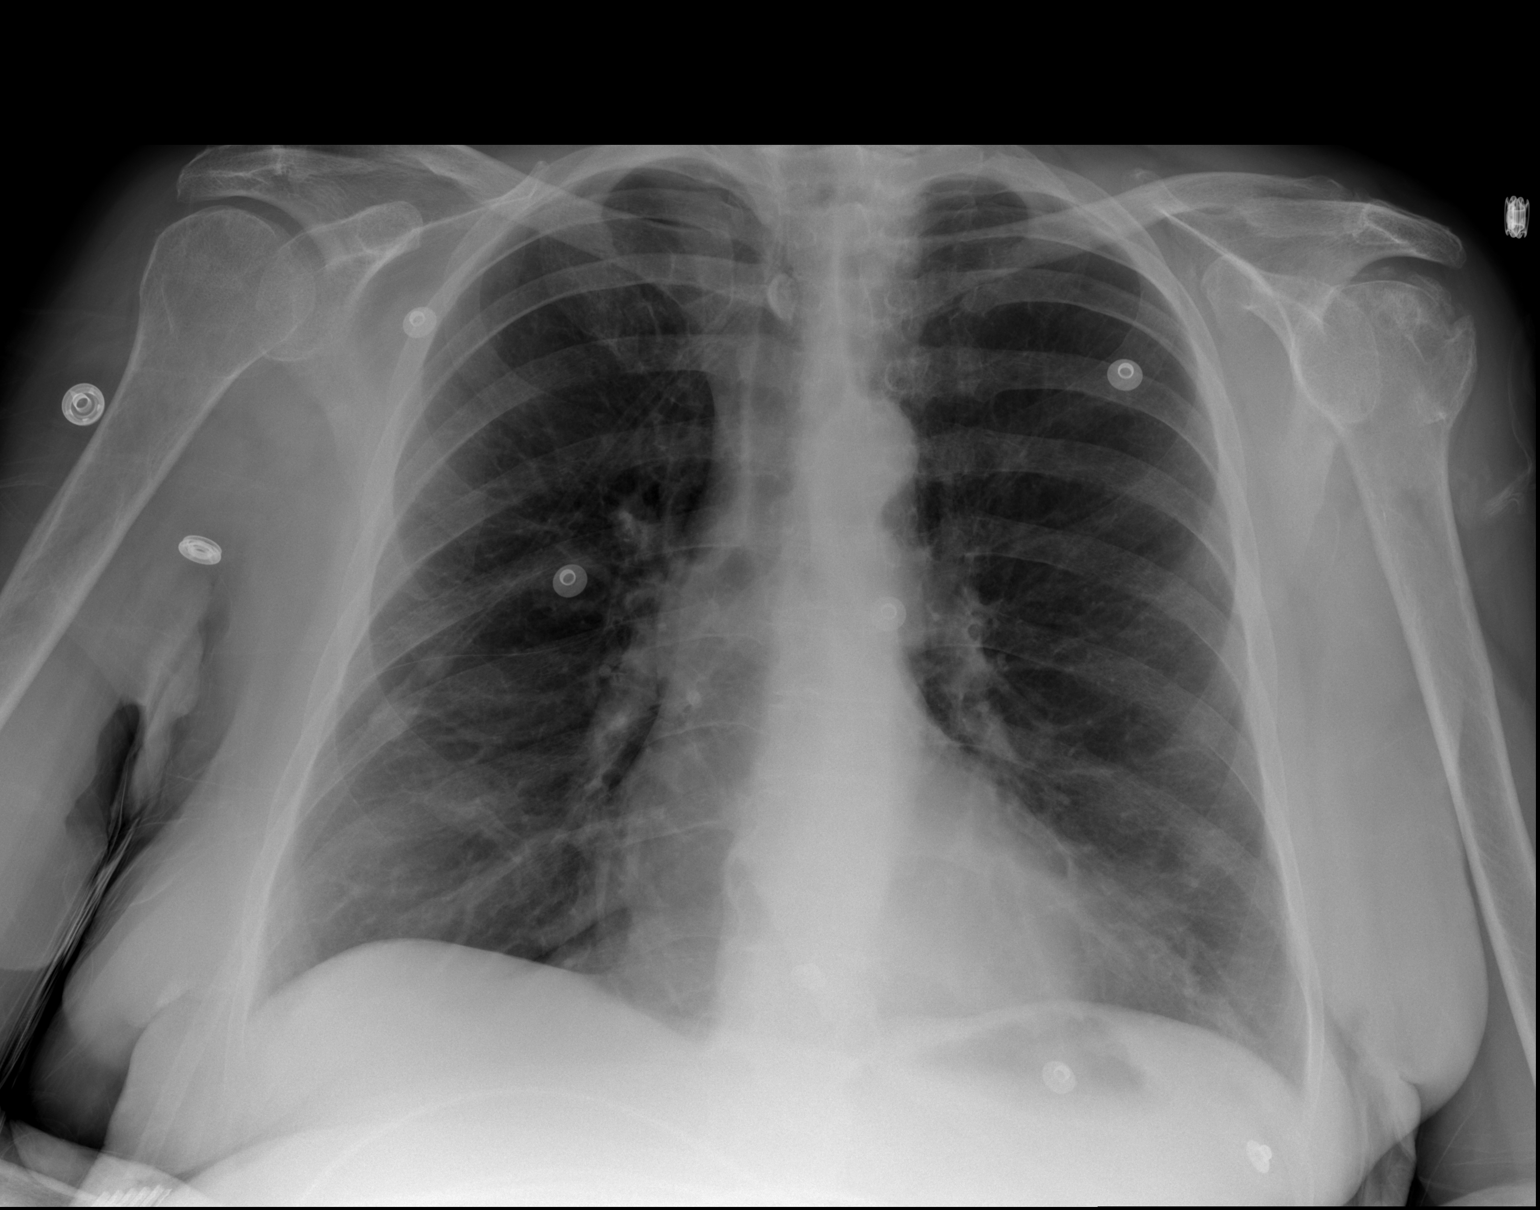

[2 of 2 positions shown; findings below may reference images not displayed]

FINDINGS: Cardiomediastinal silhouette is stable. No acute infiltrate or
pleural effusion. No pulmonary edema. Mild degenerative changes mid
thoracic spine.
IMPRESSION: No active cardiopulmonary disease.

## 2017-01-24 ENCOUNTER — Emergency Department (HOSPITAL_COMMUNITY): Payer: Medicare Other

## 2017-01-24 ENCOUNTER — Inpatient Hospital Stay (HOSPITAL_COMMUNITY)
Admission: EM | Admit: 2017-01-24 | Discharge: 2017-01-27 | DRG: 190 | Disposition: A | Discharge status: Home Health | Payer: Medicare Other | Attending: Family Medicine | Admitting: Family Medicine

## 2017-01-24 ENCOUNTER — Other Ambulatory Visit: Payer: Self-pay

## 2017-01-24 ENCOUNTER — Encounter (HOSPITAL_COMMUNITY): Payer: Self-pay | Admitting: Nurse Practitioner

## 2017-01-24 DIAGNOSIS — Z8541 Personal history of malignant neoplasm of cervix uteri: Secondary | ICD-10-CM | POA: Diagnosis not present

## 2017-01-24 DIAGNOSIS — Z9981 Dependence on supplemental oxygen: Secondary | ICD-10-CM

## 2017-01-24 DIAGNOSIS — R531 Weakness: Secondary | ICD-10-CM | POA: Diagnosis present

## 2017-01-24 DIAGNOSIS — E860 Dehydration: Secondary | ICD-10-CM | POA: Diagnosis present

## 2017-01-24 DIAGNOSIS — Z91041 Radiographic dye allergy status: Secondary | ICD-10-CM

## 2017-01-24 DIAGNOSIS — M47812 Spondylosis without myelopathy or radiculopathy, cervical region: Secondary | ICD-10-CM | POA: Diagnosis present

## 2017-01-24 DIAGNOSIS — F17210 Nicotine dependence, cigarettes, uncomplicated: Secondary | ICD-10-CM | POA: Diagnosis present

## 2017-01-24 DIAGNOSIS — E119 Type 2 diabetes mellitus without complications: Secondary | ICD-10-CM | POA: Diagnosis not present

## 2017-01-24 DIAGNOSIS — K5900 Constipation, unspecified: Secondary | ICD-10-CM | POA: Diagnosis not present

## 2017-01-24 DIAGNOSIS — F329 Major depressive disorder, single episode, unspecified: Secondary | ICD-10-CM | POA: Diagnosis present

## 2017-01-24 DIAGNOSIS — M25551 Pain in right hip: Secondary | ICD-10-CM | POA: Diagnosis present

## 2017-01-24 DIAGNOSIS — Y92009 Unspecified place in unspecified non-institutional (private) residence as the place of occurrence of the external cause: Secondary | ICD-10-CM

## 2017-01-24 DIAGNOSIS — Z79899 Other long term (current) drug therapy: Secondary | ICD-10-CM

## 2017-01-24 DIAGNOSIS — Z9119 Patient's noncompliance with other medical treatment and regimen: Secondary | ICD-10-CM

## 2017-01-24 DIAGNOSIS — W010XXA Fall on same level from slipping, tripping and stumbling without subsequent striking against object, initial encounter: Secondary | ICD-10-CM | POA: Diagnosis present

## 2017-01-24 DIAGNOSIS — Z7984 Long term (current) use of oral hypoglycemic drugs: Secondary | ICD-10-CM

## 2017-01-24 DIAGNOSIS — F909 Attention-deficit hyperactivity disorder, unspecified type: Secondary | ICD-10-CM | POA: Diagnosis present

## 2017-01-24 DIAGNOSIS — Z7951 Long term (current) use of inhaled steroids: Secondary | ICD-10-CM | POA: Diagnosis not present

## 2017-01-24 DIAGNOSIS — Z981 Arthrodesis status: Secondary | ICD-10-CM

## 2017-01-24 DIAGNOSIS — J441 Chronic obstructive pulmonary disease with (acute) exacerbation: Principal | ICD-10-CM | POA: Diagnosis present

## 2017-01-24 DIAGNOSIS — E876 Hypokalemia: Secondary | ICD-10-CM | POA: Diagnosis present

## 2017-01-24 DIAGNOSIS — F419 Anxiety disorder, unspecified: Secondary | ICD-10-CM | POA: Diagnosis present

## 2017-01-24 DIAGNOSIS — J9621 Acute and chronic respiratory failure with hypoxia: Secondary | ICD-10-CM | POA: Diagnosis present

## 2017-01-24 DIAGNOSIS — Z880 Allergy status to penicillin: Secondary | ICD-10-CM | POA: Diagnosis not present

## 2017-01-24 DIAGNOSIS — E039 Hypothyroidism, unspecified: Secondary | ICD-10-CM | POA: Diagnosis present

## 2017-01-24 DIAGNOSIS — Z881 Allergy status to other antibiotic agents status: Secondary | ICD-10-CM

## 2017-01-24 DIAGNOSIS — I10 Essential (primary) hypertension: Secondary | ICD-10-CM | POA: Diagnosis present

## 2017-01-24 DIAGNOSIS — E785 Hyperlipidemia, unspecified: Secondary | ICD-10-CM | POA: Diagnosis present

## 2017-01-24 LAB — PROTIME-INR
INR: 0.98
PROTHROMBIN TIME: 12.9 s (ref 11.4–15.2)

## 2017-01-24 LAB — CBC WITH DIFFERENTIAL/PLATELET
BASOS ABS: 0 10*3/uL (ref 0.0–0.1)
BASOS PCT: 0 %
EOS ABS: 0.1 10*3/uL (ref 0.0–0.7)
Eosinophils Relative: 1 %
HCT: 44 % (ref 36.0–46.0)
HEMOGLOBIN: 14.7 g/dL (ref 12.0–15.0)
Lymphocytes Relative: 22 %
Lymphs Abs: 2 10*3/uL (ref 0.7–4.0)
MCH: 30.1 pg (ref 26.0–34.0)
MCHC: 33.4 g/dL (ref 30.0–36.0)
MCV: 90.2 fL (ref 78.0–100.0)
Monocytes Absolute: 1 10*3/uL (ref 0.1–1.0)
Monocytes Relative: 11 %
NEUTROS PCT: 66 %
Neutro Abs: 5.9 10*3/uL (ref 1.7–7.7)
PLATELETS: 247 10*3/uL (ref 150–400)
RBC: 4.88 MIL/uL (ref 3.87–5.11)
RDW: 13.2 % (ref 11.5–15.5)
WBC: 9.1 10*3/uL (ref 4.0–10.5)

## 2017-01-24 LAB — COMPREHENSIVE METABOLIC PANEL
ALT: 17 U/L (ref 14–54)
ANION GAP: 11 (ref 5–15)
AST: 24 U/L (ref 15–41)
Albumin: 3.5 g/dL (ref 3.5–5.0)
Alkaline Phosphatase: 73 U/L (ref 38–126)
BUN: 5 mg/dL — ABNORMAL LOW (ref 6–20)
CALCIUM: 8.8 mg/dL — AB (ref 8.9–10.3)
CO2: 28 mmol/L (ref 22–32)
CREATININE: 0.65 mg/dL (ref 0.44–1.00)
Chloride: 98 mmol/L — ABNORMAL LOW (ref 101–111)
Glucose, Bld: 142 mg/dL — ABNORMAL HIGH (ref 65–99)
Potassium: 3.3 mmol/L — ABNORMAL LOW (ref 3.5–5.1)
SODIUM: 137 mmol/L (ref 135–145)
TOTAL PROTEIN: 7.2 g/dL (ref 6.5–8.1)
Total Bilirubin: 0.9 mg/dL (ref 0.3–1.2)

## 2017-01-24 LAB — GLUCOSE, CAPILLARY: GLUCOSE-CAPILLARY: 230 mg/dL — AB (ref 65–99)

## 2017-01-24 LAB — URINALYSIS, ROUTINE W REFLEX MICROSCOPIC
BILIRUBIN URINE: NEGATIVE
GLUCOSE, UA: NEGATIVE mg/dL
Hgb urine dipstick: NEGATIVE
KETONES UR: NEGATIVE mg/dL
LEUKOCYTES UA: NEGATIVE
NITRITE: NEGATIVE
PH: 7 (ref 5.0–8.0)
PROTEIN: NEGATIVE mg/dL
Specific Gravity, Urine: 1.005 (ref 1.005–1.030)

## 2017-01-24 LAB — I-STAT CG4 LACTIC ACID, ED: LACTIC ACID, VENOUS: 1.07 mmol/L (ref 0.5–1.9)

## 2017-01-24 LAB — I-STAT TROPONIN, ED: TROPONIN I, POC: 0 ng/mL (ref 0.00–0.08)

## 2017-01-24 LAB — BRAIN NATRIURETIC PEPTIDE: B Natriuretic Peptide: 81 pg/mL (ref 0.0–100.0)

## 2017-01-24 MED ORDER — HYDROCHLOROTHIAZIDE 25 MG PO TABS
25.0000 mg | ORAL_TABLET | Freq: Every day | ORAL | Status: DC
  Administered 2017-01-25 – 2017-01-27 (×3): 25 mg via ORAL
  Filled 2017-01-24 (×3): qty 1

## 2017-01-24 MED ORDER — LEVOFLOXACIN IN D5W 500 MG/100ML IV SOLN
500.0000 mg | Freq: Every day | INTRAVENOUS | Status: DC
  Administered 2017-01-24 – 2017-01-25 (×2): 500 mg via INTRAVENOUS
  Filled 2017-01-24 (×2): qty 100

## 2017-01-24 MED ORDER — ALBUTEROL SULFATE (2.5 MG/3ML) 0.083% IN NEBU
5.0000 mg | INHALATION_SOLUTION | Freq: Once | RESPIRATORY_TRACT | Status: AC
  Administered 2017-01-24: 5 mg via RESPIRATORY_TRACT
  Filled 2017-01-24: qty 6

## 2017-01-24 MED ORDER — SODIUM CHLORIDE 0.9 % IV SOLN: 250.0000 mL | INTRAVENOUS | Status: DC | PRN

## 2017-01-24 MED ORDER — INSULIN ASPART 100 UNIT/ML ~~LOC~~ SOLN: 0.0000 [IU] | Freq: Three times a day (TID) | SUBCUTANEOUS | Status: DC

## 2017-01-24 MED ORDER — POTASSIUM CHLORIDE CRYS ER 20 MEQ PO TBCR
20.0000 meq | EXTENDED_RELEASE_TABLET | Freq: Once | ORAL | Status: DC
  Filled 2017-01-24 (×2): qty 1

## 2017-01-24 MED ORDER — ACETAMINOPHEN 650 MG RE SUPP: 650.0000 mg | Freq: Four times a day (QID) | RECTAL | Status: DC | PRN

## 2017-01-24 MED ORDER — ALBUTEROL SULFATE (2.5 MG/3ML) 0.083% IN NEBU
2.5000 mg | INHALATION_SOLUTION | RESPIRATORY_TRACT | Status: DC | PRN
  Filled 2017-01-24: qty 3

## 2017-01-24 MED ORDER — LINAGLIPTIN 5 MG PO TABS
5.0000 mg | ORAL_TABLET | Freq: Every day | ORAL | Status: DC
  Administered 2017-01-25 – 2017-01-27 (×3): 5 mg via ORAL
  Filled 2017-01-24 (×3): qty 1

## 2017-01-24 MED ORDER — METHYLPREDNISOLONE SODIUM SUCC 125 MG IJ SOLR
125.0000 mg | Freq: Once | INTRAMUSCULAR | Status: AC
Start: 2017-01-24 — End: 2017-01-24
  Administered 2017-01-24: 125 mg via INTRAVENOUS
  Filled 2017-01-24: qty 2

## 2017-01-24 MED ORDER — ATORVASTATIN CALCIUM 20 MG PO TABS
20.0000 mg | ORAL_TABLET | Freq: Every day | ORAL | Status: DC
  Administered 2017-01-25 – 2017-01-27 (×3): 20 mg via ORAL
  Filled 2017-01-24 (×3): qty 1

## 2017-01-24 MED ORDER — HYDROCODONE-ACETAMINOPHEN 10-325 MG PO TABS
1.0000 | ORAL_TABLET | Freq: Four times a day (QID) | ORAL | Status: DC | PRN
  Administered 2017-01-25 – 2017-01-27 (×4): 1 via ORAL
  Filled 2017-01-24 (×4): qty 1

## 2017-01-24 MED ORDER — NORTRIPTYLINE HCL 25 MG PO CAPS
75.0000 mg | ORAL_CAPSULE | Freq: Every day | ORAL | Status: DC
  Administered 2017-01-25 – 2017-01-26 (×3): 75 mg via ORAL
  Filled 2017-01-24 (×4): qty 3

## 2017-01-24 MED ORDER — LORATADINE 10 MG PO TABS
10.0000 mg | ORAL_TABLET | Freq: Every day | ORAL | Status: DC
  Administered 2017-01-25 – 2017-01-27 (×3): 10 mg via ORAL
  Filled 2017-01-24 (×3): qty 1

## 2017-01-24 MED ORDER — FLEET ENEMA 7-19 GM/118ML RE ENEM: 1.0000 | ENEMA | Freq: Once | RECTAL | Status: DC

## 2017-01-24 MED ORDER — ENOXAPARIN SODIUM 40 MG/0.4ML ~~LOC~~ SOLN
40.0000 mg | Freq: Every day | SUBCUTANEOUS | Status: DC
  Administered 2017-01-24 – 2017-01-26 (×3): 40 mg via SUBCUTANEOUS
  Filled 2017-01-24 (×3): qty 0.4

## 2017-01-24 MED ORDER — VITAMIN D (ERGOCALCIFEROL) 1.25 MG (50000 UNIT) PO CAPS
50000.0000 [IU] | ORAL_CAPSULE | ORAL | Status: DC
  Administered 2017-01-25: 50000 [IU] via ORAL
  Filled 2017-01-24: qty 1

## 2017-01-24 MED ORDER — SODIUM CHLORIDE 0.9 % IV BOLUS (SEPSIS)
1000.0000 mL | Freq: Once | INTRAVENOUS | Status: AC
  Administered 2017-01-24: 1000 mL via INTRAVENOUS

## 2017-01-24 MED ORDER — MAGNESIUM OXIDE 400 (241.3 MG) MG PO TABS: 400.0000 mg | ORAL_TABLET | Freq: Two times a day (BID) | ORAL | Status: DC

## 2017-01-24 MED ORDER — POTASSIUM CHLORIDE CRYS ER 20 MEQ PO TBCR
20.0000 meq | EXTENDED_RELEASE_TABLET | Freq: Every day | ORAL | Status: DC
  Administered 2017-01-25 – 2017-01-27 (×4): 20 meq via ORAL
  Filled 2017-01-24 (×3): qty 1

## 2017-01-24 MED ORDER — LIOTHYRONINE SODIUM 25 MCG PO TABS
25.0000 ug | ORAL_TABLET | ORAL | Status: DC
  Administered 2017-01-25 – 2017-01-26 (×2): 25 ug via ORAL
  Filled 2017-01-24 (×2): qty 1

## 2017-01-24 MED ORDER — ALPRAZOLAM 1 MG PO TABS: 1.0000 mg | ORAL_TABLET | Freq: Every evening | ORAL | Status: DC | PRN

## 2017-01-24 MED ORDER — LORATADINE 10 MG PO TABS: 10.0000 mg | ORAL_TABLET | Freq: Every day | ORAL | Status: DC

## 2017-01-24 MED ORDER — ALBUTEROL SULFATE (2.5 MG/3ML) 0.083% IN NEBU
2.5000 mg | INHALATION_SOLUTION | Freq: Four times a day (QID) | RESPIRATORY_TRACT | Status: DC
  Administered 2017-01-24 – 2017-01-25 (×5): 2.5 mg via RESPIRATORY_TRACT
  Filled 2017-01-24 (×4): qty 3

## 2017-01-24 MED ORDER — TIOTROPIUM BROMIDE MONOHYDRATE 18 MCG IN CAPS: 18.0000 ug | ORAL_CAPSULE | Freq: Every day | RESPIRATORY_TRACT | Status: DC

## 2017-01-24 MED ORDER — TIOTROPIUM BROMIDE MONOHYDRATE 18 MCG IN CAPS
18.0000 ug | ORAL_CAPSULE | Freq: Every day | RESPIRATORY_TRACT | Status: DC
  Administered 2017-01-25 – 2017-01-27 (×3): 18 ug via RESPIRATORY_TRACT
  Filled 2017-01-24 (×2): qty 5

## 2017-01-24 MED ORDER — GLIPIZIDE 10 MG PO TABS: 10.0000 mg | ORAL_TABLET | Freq: Two times a day (BID) | ORAL | Status: DC

## 2017-01-24 MED ORDER — ACETAMINOPHEN 325 MG PO TABS: 650.0000 mg | ORAL_TABLET | Freq: Four times a day (QID) | ORAL | Status: DC | PRN

## 2017-01-24 MED ORDER — INSULIN ASPART 100 UNIT/ML ~~LOC~~ SOLN
0.0000 [IU] | Freq: Every day | SUBCUTANEOUS | Status: DC
  Administered 2017-01-24: 2 [IU] via SUBCUTANEOUS

## 2017-01-24 MED ORDER — IPRATROPIUM BROMIDE 0.02 % IN SOLN
0.5000 mg | Freq: Once | RESPIRATORY_TRACT | Status: AC
  Administered 2017-01-24: 0.5 mg via RESPIRATORY_TRACT
  Filled 2017-01-24: qty 2.5

## 2017-01-24 MED ORDER — SODIUM CHLORIDE 0.9% FLUSH
3.0000 mL | Freq: Two times a day (BID) | INTRAVENOUS | Status: DC
  Administered 2017-01-25 – 2017-01-27 (×5): 3 mL via INTRAVENOUS

## 2017-01-24 MED ORDER — BREXPIPRAZOLE 1 MG PO TABS
4.0000 mg | ORAL_TABLET | Freq: Every morning | ORAL | Status: DC
  Administered 2017-01-25 – 2017-01-27 (×3): 4 mg via ORAL
  Filled 2017-01-24 (×3): qty 4

## 2017-01-24 MED ORDER — METOPROLOL SUCCINATE ER 100 MG PO TB24
100.0000 mg | ORAL_TABLET | Freq: Every day | ORAL | Status: DC
  Administered 2017-01-25 – 2017-01-27 (×3): 100 mg via ORAL
  Filled 2017-01-24 (×3): qty 1

## 2017-01-24 MED ORDER — ALPRAZOLAM 1 MG PO TABS
2.0000 mg | ORAL_TABLET | Freq: Every day | ORAL | Status: DC
  Administered 2017-01-25 – 2017-01-26 (×3): 2 mg via ORAL
  Filled 2017-01-24 (×3): qty 2

## 2017-01-24 MED ORDER — VORTIOXETINE HBR 20 MG PO TABS
20.0000 mg | ORAL_TABLET | Freq: Every day | ORAL | Status: DC
  Administered 2017-01-25 – 2017-01-27 (×3): 20 mg via ORAL
  Filled 2017-01-24 (×3): qty 20

## 2017-01-24 MED ORDER — MOMETASONE FURO-FORMOTEROL FUM 200-5 MCG/ACT IN AERO
2.0000 | INHALATION_SPRAY | Freq: Two times a day (BID) | RESPIRATORY_TRACT | Status: DC
  Administered 2017-01-25 – 2017-01-27 (×5): 2 via RESPIRATORY_TRACT
  Filled 2017-01-24: qty 8.8

## 2017-01-24 MED ORDER — METHYLPREDNISOLONE SODIUM SUCC 125 MG IJ SOLR
80.0000 mg | Freq: Three times a day (TID) | INTRAMUSCULAR | Status: DC
  Administered 2017-01-24 – 2017-01-25 (×2): 80 mg via INTRAVENOUS
  Filled 2017-01-24 (×2): qty 2

## 2017-01-24 MED ORDER — SODIUM CHLORIDE 0.9% FLUSH: 3.0000 mL | INTRAVENOUS | Status: DC | PRN

## 2017-01-24 MED ORDER — METFORMIN HCL 500 MG PO TABS: 1000.0000 mg | ORAL_TABLET | Freq: Two times a day (BID) | ORAL | Status: DC

## 2017-01-24 MED ORDER — ALPRAZOLAM 1 MG PO TABS
1.0000 mg | ORAL_TABLET | Freq: Three times a day (TID) | ORAL | Status: DC
  Administered 2017-01-25 – 2017-01-27 (×9): 1 mg via ORAL
  Filled 2017-01-24 (×9): qty 1

## 2017-01-24 NOTE — ED Notes (Signed)
[name] (Granddaughter/Caregiver) [phone]

## 2017-01-24 NOTE — ED Notes (Signed)
ED TO INPATIENT HANDOFF REPORT  Name/Age/Gender [name] 61 y.o. female  Code Status Code Status History    Date Active Date Inactive Code Status Order ID Comments User Context   08/08/2016 23:01 08/09/2016 23:06 Full Code [id number]  Toy Baker, MD Inpatient   06/16/2015 20:08 06/19/2015 20:52 Full Code [id number]  Ivor Costa, MD ED   06/04/2015 22:31 06/05/2015 21:21 Full Code [id number]  Toy Baker, MD Inpatient   01/04/2014 00:36 01/05/2014 14:54 Full Code [id number]  Ivor Costa, MD Inpatient      Home/SNF/Other Home  Chief Complaint Hip Pain; Weakness  Level of Care/Admitting Diagnosis ED Disposition    ED Disposition Condition [place] [facility]: [facility] [[id number]]  Level of Care: Telemetry [5]  Admit to tele based on following criteria: Monitor QTC interval  Diagnosis: COPD exacerbation [facility]) [[id number]]  Admitting Physician: Jani Gravel [phone]  Attending Physician: Jani Gravel [phone]  Estimated length of stay: past midnight tomorrow  Certification:: I certify this patient will need inpatient services for at least 2 midnights  PT Class (Do Not Modify): Inpatient [101]  PT Acc Code (Do Not Modify): Private [1]       Medical History Past Medical History:  Diagnosis Date  . ADD (attention deficit disorder with hyperactivity)   . Anxiety   . Arthritis    "neck" (06/16/2015)  . Asthma   . Basal cell carcinoma, face   . Cervical cancer ([place])   . Chronic bronchitis ([place])   . 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 COPD (chronic obstructive pulmonary disease) ([place])   . Depression   . GERD (gastroesophageal reflux disease)   . Hypertension   . On home oxygen therapy    "2L at bedtime" (06/16/2015)  . Pneumonia 1980s X 1  . Squamous cell carcinoma, face   . Thyroid cyst   . Type II diabetes mellitus (HCC)     Allergies Allergies  Allergen Reactions  . Contrast Media [Iodinated Diagnostic Agents] Nausea And Vomiting  .  Doxycycline Nausea And Vomiting  . Erythromycin Nausea And Vomiting  . Penicillins Itching and Rash    Has patient had a PCN reaction causing immediate rash, facial/tongue/throat swelling, SOB or lightheadedness with hypotension: YES Has patient had a PCN reaction causing severe rash involving mucus membranes or skin necrosis: NO Has patient had a PCN reaction that required hospitalization: NO Has patient had a PCN reaction occurring within the last 10 years: Unknown If all of the above answers are "NO", then may proceed with Cephalosporin use.   . Metronidazole Hives and Rash    IV Location/Drains/Wounds Patient Lines/Drains/Airways Status   Active Line/Drains/Airways    Name:   Placement date:   Placement time:   Site:   Days:   Peripheral IV 01/24/17 Anterior;Left Forearm   01/24/17    1446    Forearm   less than 1   Wound / Incision (Open or Dehisced) 01/04/14 Incision - Open Abdomen Lower;Mid Pinpoint hole   01/04/14    2050    Abdomen   1116          Labs/Imaging Results for orders placed or performed during the hospital encounter of 01/24/17 (from the past 48 hour(s))  Brain natriuretic peptide     Status: None   Collection Time: 01/24/17  2:55 PM  Result Value Ref Range   B Natriuretic Peptide 81.0 0.0 - 100.0 pg/mL  Comprehensive metabolic panel     Status: Abnormal  Collection Time: 01/24/17  2:57 PM  Result Value Ref Range   Sodium 137 135 - 145 mmol/L   Potassium 3.3 (L) 3.5 - 5.1 mmol/L   Chloride 98 (L) 101 - 111 mmol/L   CO2 28 22 - 32 mmol/L   Glucose, Bld 142 (H) 65 - 99 mg/dL   BUN 5 (L) 6 - 20 mg/dL   Creatinine, Ser 0.65 0.44 - 1.00 mg/dL   Calcium 8.8 (L) 8.9 - 10.3 mg/dL   Total Protein 7.2 6.5 - 8.1 g/dL   Albumin 3.5 3.5 - 5.0 g/dL   AST 24 15 - 41 U/L   ALT 17 14 - 54 U/L   Alkaline Phosphatase 73 38 - 126 U/L   Total Bilirubin 0.9 0.3 - 1.2 mg/dL   GFR calc non Af Amer >60 >60 mL/min   GFR calc Af Amer >60 >60 mL/min    Comment: (NOTE) The  eGFR has been calculated using the CKD EPI equation. This calculation has not been validated in all clinical situations. eGFR's persistently <60 mL/min signify possible Chronic Kidney Disease.    Anion gap 11 5 - 15  CBC with Differential     Status: None   Collection Time: 01/24/17  2:57 PM  Result Value Ref Range   WBC 9.1 4.0 - 10.5 K/uL   RBC 4.88 3.87 - 5.11 MIL/uL   Hemoglobin 14.7 12.0 - 15.0 g/dL   HCT 44.0 36.0 - 46.0 %   MCV 90.2 78.0 - 100.0 fL   MCH 30.1 26.0 - 34.0 pg   MCHC 33.4 30.0 - 36.0 g/dL   RDW 13.2 11.5 - 15.5 %   Platelets 247 150 - 400 K/uL   Neutrophils Relative % 66 %   Neutro Abs 5.9 1.7 - 7.7 K/uL   Lymphocytes Relative 22 %   Lymphs Abs 2.0 0.7 - 4.0 K/uL   Monocytes Relative 11 %   Monocytes Absolute 1.0 0.1 - 1.0 K/uL   Eosinophils Relative 1 %   Eosinophils Absolute 0.1 0.0 - 0.7 K/uL   Basophils Relative 0 %   Basophils Absolute 0.0 0.0 - 0.1 K/uL  Protime-INR     Status: None   Collection Time: 01/24/17  2:57 PM  Result Value Ref Range   Prothrombin Time 12.9 11.4 - 15.2 seconds   INR 0.98   Urinalysis, Routine w reflex microscopic     Status: None   Collection Time: 01/24/17  2:57 PM  Result Value Ref Range   Color, Urine YELLOW YELLOW   APPearance CLEAR CLEAR   Specific Gravity, Urine 1.005 1.005 - 1.030   pH 7.0 5.0 - 8.0   Glucose, UA NEGATIVE NEGATIVE mg/dL   Hgb urine dipstick NEGATIVE NEGATIVE   Bilirubin Urine NEGATIVE NEGATIVE   Ketones, ur NEGATIVE NEGATIVE mg/dL   Protein, ur NEGATIVE NEGATIVE mg/dL   Nitrite NEGATIVE NEGATIVE   Leukocytes, UA NEGATIVE NEGATIVE  I-Stat CG4 Lactic Acid, ED     Status: None   Collection Time: 01/24/17  3:10 PM  Result Value Ref Range   Lactic Acid, Venous 1.07 0.5 - 1.9 mmol/L  I-stat troponin, ED     Status: None   Collection Time: 01/24/17  3:41 PM  Result Value Ref Range   Troponin i, poc 0.00 0.00 - 0.08 ng/mL   Comment 3            Comment: Due to the release kinetics of cTnI, a  negative result within the first hours of the  onset of symptoms does not rule out myocardial infarction with certainty. If myocardial infarction is still suspected, repeat the test at appropriate intervals.    Dg Chest 2 View  Result Date: 01/24/2017 CLINICAL DATA:  61 year old female with wheezing and congestion for 2 days. Smoker. EXAM: CHEST  2 VIEW COMPARISON:  08/08/2016 and earlier. FINDINGS: Semi upright AP and lateral views of the chest. Mildly lower lung volumes. Stable cardiac size and mediastinal contours. No pneumothorax, pulmonary edema, pleural effusion or confluent pulmonary opacity. Osteopenia. No acute osseous abnormality identified. Cholecystectomy surgical clips. Negative visible bowel gas pattern. IMPRESSION: No acute cardiopulmonary abnormality. Electronically Signed   By: Genevie Ann M.D.   On: 01/24/2017 17:38   Dg Abd 2 Views  Result Date: 01/24/2017 CLINICAL DATA:  Constipation. EXAM: ABDOMEN - 2 VIEW COMPARISON:  Body CT 09/20/2012 FINDINGS: The bowel gas pattern is normal. There is no evidence of free air. Moderate amount of formed stool in the rectum. Spiral shaped metallic body seen overlying the right sacrum on the frontal views. IMPRESSION: Nonobstructive bowel gas pattern. Spiral shaped metallic body seen overlying the right sacrum. Etiology uncertain. Query prior anterior abdominal wall repair anchor. Electronically Signed   By: Fidela Salisbury M.D.   On: 01/24/2017 17:49   Dg Femur Min 2 Views Right  Result Date: 01/24/2017 CLINICAL DATA:  61 year old female status post fall 3 days ago with continued right hip and upper leg pain. EXAM: RIGHT FEMUR 2 VIEWS COMPARISON:  CT Abdomen and Pelvis 09/20/2012. FINDINGS: The right femoral head is normally located. The right hip joint space appears stable an normal. Calcified right femoral artery atherosclerosis. Visible right hemipelvis intact. Right femoral shaft is intact. Distal right femur is intact. Normal alignment  at the right knee. No right knee joint effusion identified. Mid and distal femoral artery calcified atherosclerosis. Chondrocalcinosis at the right knee. Grossly intact proximal tib-fib. IMPRESSION: 1. No acute fracture or dislocation identified about the right femur. 2. Calcified peripheral vascular disease. Electronically Signed   By: Genevie Ann M.D.   On: 01/24/2017 17:40    Pending Labs Unresulted Labs (From admission, onward)   Start     Ordered   01/24/17 1517  Urine culture  STAT,   STAT     01/24/17 1516   01/24/17 1459  Culture, blood (Routine x 2)  BLOOD CULTURE X 2,   STAT     01/24/17 1458   Signed and Held  Creatinine, serum  (enoxaparin (LOVENOX)    CrCl >/= 30 ml/min)  Weekly,   R    Comments:  while on enoxaparin therapy    Signed and Held   Signed and Held  Comprehensive metabolic panel  Tomorrow morning,   R     Signed and Held   Signed and Held  CBC  Tomorrow morning,   R     Signed and Held      Vitals/Pain Today's Vitals   01/24/17 1456 01/24/17 1511 01/24/17 1900 01/24/17 2000  BP:   (!) 187/133 133/68  Pulse:   (!) 110 78  Resp:   (!) 22 (!) 22  Temp:  99.9 F (37.7 C)    TempSrc:  Rectal    SpO2:   96% 96%  Weight:      Height:      PainSc: 7        Isolation Precautions No active isolations  Medications Medications  sodium phosphate (FLEET) 7-19 GM/118ML enema 1 enema (not administered)  potassium chloride  SA (K-DUR,KLOR-CON) CR tablet 20 mEq (not administered)  sodium chloride 0.9 % bolus 1,000 mL (0 mLs Intravenous Stopped 01/24/17 1907)  albuterol (PROVENTIL) (2.5 MG/3ML) 0.083% nebulizer solution 5 mg (5 mg Nebulization Given 01/24/17 1552)  ipratropium (ATROVENT) nebulizer solution 0.5 mg (0.5 mg Nebulization Given 01/24/17 1552)  methylPREDNISolone sodium succinate (SOLU-MEDROL) 125 mg/2 mL injection 125 mg (125 mg Intravenous Given 01/24/17 1553)    Mobility walks with device

## 2017-01-24 NOTE — H&P (Signed)
TRH H&P   Patient Demographics:    [name], is a 61 y.o. female  MRN: [id number]   DOB - 1955/08/22  Admit Date - 01/24/2017  Outpatient Primary MD for the patient is Elwyn Reach, MD  Referring MD/NP/PA:  Shirlyn Goltz  Outpatient Specialists:    Patient coming from: home  Chief Complaint  Patient presents with  . Weakness  . Fever  . Hip Pain    Right       HPI:    [name]  is a 61 y.o. female, w Jerrye Bushy, Depression/anxiety, Hypertension, hyperlipidemia, Dm2, Nicotine Dep, Copd/Asthma, presents with c/o dyspnea for the past 1 week worse today.  Denies fever, chills, cp, palp, cough, n/v, diarrhea, brbpr.  Pt is still smoking and not yet ready to quit.     In ED,  CXR IMPRESSION: No acute cardiopulmonary abnormality.  BNP 81.0 Na 137, K 3.3 Bun 5, Creatinine 0.65 Ast 24, Alt 17  Wbc 9.1, Hgb 14.7, Plt 247  Pt will be admitted for Copd exacerbation.      Review of systems:    In addition to the HPI above, No Fever-chills, No Headache, No changes with Vision or hearing, No problems swallowing food or Liquids, No Chest pain,  No Abdominal pain, No Nausea or Vommitting, Bowel movements are regular, No Blood in stool or Urine, No dysuria, No new skin rashes or bruises, No new joints pains-aches,  No new weakness, tingling, numbness in any extremity, No recent weight gain or loss, No polyuria, polydypsia or polyphagia, No significant Mental Stressors.  A full 10 point Review of Systems was done, except as stated above, all other Review of Systems were negative.   With Past History of the following :    Past Medical History:  Diagnosis Date  . ADD (attention deficit disorder with hyperactivity)   . Anxiety   . Arthritis    "neck" (06/16/2015)  . Asthma   . Basal cell carcinoma, face   . Cervical cancer ([place])   . Chronic bronchitis  ([place])   . 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 COPD (chronic obstructive pulmonary disease) ([place])   . Depression   . GERD (gastroesophageal reflux disease)   . Hypertension   . On home oxygen therapy    "2L at bedtime" (06/16/2015)  . Pneumonia 1980s X 1  . Squamous cell carcinoma, face   . Thyroid cyst   . Type II diabetes mellitus ([place])       Past Surgical History:  Procedure Laterality Date  . ABDOMINAL HYSTERECTOMY    . ANTERIOR CERVICAL DECOMP/DISCECTOMY FUSION    . BACK SURGERY    . BASAL CELL CARCINOMA EXCISION     "face"  . COLECTOMY    . EXCISIONAL HEMORRHOIDECTOMY    . HERNIA REPAIR  X 3   incisional/ventral hernia repair "in my stomach where the womb  is"  . LAPAROSCOPIC CHOLECYSTECTOMY    . OOPHORECTOMY  2006   cervical cancer  . RECTOVAGINAL FISTULA CLOSURE  2012  . SQUAMOUS CELL CARCINOMA EXCISION    . TUBAL LIGATION    . VULVA SURGERY  2006   Cancer in situ  . WRIST SURGERY Right X 1   "accidentially cut it"  . WRIST SURGERY Left    "don't remember why"      Social History:     Social History   Tobacco Use  . Smoking status: Current Every Day Smoker    Packs/day: 1.00    Years: 47.00    Pack years: 47.00    Types: Cigarettes  . Smokeless tobacco: Never Used  Substance Use Topics  . Alcohol use: No     Lives - at home  Mobility - walks by self  Family History :     Family History  Problem Relation Age of Onset  . COPD Father   . Cancer Father        lung  . Cancer Paternal Aunt        lung      Home Medications:   Prior to Admission medications   Medication Sig Start Date End Date Taking? Authorizing Provider  alprazolam Duanne Moron) 2 MG tablet Take 0.5 tablets (1 mg total) by mouth at bedtime as needed for sleep or anxiety (Can take up to 3 thimes daily prn). Patient takes 1/2 tablet by mouth four times a day Patient taking differently: Take 1-2 mg by mouth 4 (four) times daily - after meals and at bedtime. Patient takes 1/2 tab three  times daily and 1 tab at bedtime 01/05/14  Yes Theodis Blaze, MD  atorvastatin (LIPITOR) 20 MG tablet TK 1 T PO D ATN 01/16/17  Yes [provider]  glipiZIDE (GLUCOTROL) 10 MG tablet Take 10 mg by mouth 2 (two) times daily.    Yes [provider]  hydrochlorothiazide (HYDRODIURIL) 25 MG tablet TK 1 T PO D 11/15/16  Yes [provider]  JANUVIA 50 MG tablet TK 1 T PO D 01/16/17  Yes [provider]  metFORMIN (GLUCOPHAGE) 1000 MG tablet TK 1 T PO BID 11/15/16  Yes [provider]  metoprolol succinate (TOPROL-XL) 100 MG 24 hr tablet TK 1 T PO D 01/16/17  Yes [provider]  nortriptyline (PAMELOR) 25 MG capsule Take 2 capsules (50 mg total) by mouth at bedtime. Patient taking differently: Take 75 mg by mouth at bedtime.  06/19/15  Yes Eugenie Filler, MD  potassium chloride SA (K-DUR,KLOR-CON) 20 MEQ tablet Take 1 tablet (20 mEq total) by mouth daily. 08/09/16  Yes Jani Gravel, MD  REXULTI 4 MG TABS Take 4 mg by mouth every morning. 03/11/15  Yes [provider]  TRINTELLIX 20 MG TABS TK 1 T PO QAM 01/01/17  Yes [provider]  venlafaxine XR (EFFEXOR-XR) 150 MG 24 hr capsule Take 150 mg by mouth every morning. 05/13/15  Yes [provider]  albuterol (PROVENTIL HFA;VENTOLIN HFA) 108 (90 BASE) MCG/ACT inhaler Inhale 2 puffs into the lungs 2 (two) times daily as needed for wheezing.    [provider]  albuterol (PROVENTIL) (2.5 MG/3ML) 0.083% nebulizer solution Take 3 mLs (2.5 mg total) by nebulization every 4 (four) hours as needed for wheezing or shortness of breath. 08/09/16   Jani Gravel, MD  budesonide-formoterol [facility]) 160-4.5 MCG/ACT inhaler Inhale 2 puffs into the lungs 2 (two) times daily. 10/12/16  Tanda Rockers, MD  dextromethorphan-guaiFENesin [facility] DM) 30-600 MG 12hr tablet Take 2 tablets by mouth 2 (two) times daily. Take for 4 days then stop. Patient not taking: Reported on 08/08/2016 06/19/15    Eugenie Filler, MD  fluticasone [facility]) 50 MCG/ACT nasal spray Place 2 sprays into both nostrils daily. Take for 5 days then stop. Patient not taking: Reported on 08/08/2016 06/19/15   Eugenie Filler, MD  HYDROcodone-acetaminophen [facility]) 10-325 MG tablet Take 1 tablet by mouth every 6 (six) hours as needed for moderate pain.  03/05/15   [provider]  liothyronine (CYTOMEL) 25 MCG tablet Take 25 mcg by mouth every morning. 05/22/16   [provider]  loratadine (CLARITIN) 10 MG tablet Take 1 tablet (10 mg total) by mouth daily. Patient not taking: Reported on 08/08/2016 06/19/15   Eugenie Filler, MD  magnesium oxide (MAG-OX) 400 (241.3 Mg) MG tablet Take 1 tablet (400 mg total) by mouth 2 (two) times daily. For 7 days Patient not taking: Reported on 08/08/2016 06/05/15   Bonnielee Haff, MD  metFORMIN (GLUCOPHAGE) 500 MG tablet Take 500 mg by mouth 2 (two) times daily with a meal.    [provider]  metoprolol tartrate (LOPRESSOR) 25 MG tablet Take 0.5 tablets (12.5 mg total) by mouth 2 (two) times daily. Patient not taking: Reported on 01/24/2017 06/19/15   Eugenie Filler, MD  nefazodone (SERZONE) 100 MG tablet Take 200 mg by mouth 2 (two) times daily. 07/26/16   [provider]  nicotine (NICODERM CQ - DOSED IN MG/24 HOURS) 21 mg/24hr patch Place 1 patch (21 mg total) onto the skin daily. Patient not taking: Reported on 01/24/2017 08/10/16   Jani Gravel, MD  Respiratory Therapy Supplies (FLUTTER) DEVI Use as directed 10/12/16   Tanda Rockers, MD  tiotropium (SPIRIVA HANDIHALER) 18 MCG inhalation capsule Place 1 capsule (18 mcg total) into inhaler and inhale daily. Patient not taking: Reported on 08/08/2016 06/19/15   Eugenie Filler, MD  Vitamin D, Ergocalciferol, (DRISDOL) 50000 UNITS CAPS Take 50,000 Units by mouth every 7 (seven) days. On thursday    [provider]     Allergies:     Allergies  Allergen Reactions  . Contrast Media  [Iodinated Diagnostic Agents] Nausea And Vomiting  . Doxycycline Nausea And Vomiting  . Erythromycin Nausea And Vomiting  . Penicillins Itching and Rash    Has patient had a PCN reaction causing immediate rash, facial/tongue/throat swelling, SOB or lightheadedness with hypotension: YES Has patient had a PCN reaction causing severe rash involving mucus membranes or skin necrosis: NO Has patient had a PCN reaction that required hospitalization: NO Has patient had a PCN reaction occurring within the last 10 years: Unknown If all of the above answers are "NO", then may proceed with Cephalosporin use.   . Metronidazole Hives and Rash     Physical Exam:   Vitals  Blood pressure (!) 168/97, pulse 98, temperature 99.9 F (37.7 C), temperature source Rectal, resp. rate 20, height 5\' 5"  (1.651 m), weight 70.3 kg (155 lb), SpO2 (!) 85 %.   1. General  lying in bed in NAD,  2. Normal affect and insight, Not Suicidal or Homicidal, Awake Alert, Oriented X 3.  3. No F.N deficits, ALL C.Nerves Intact, Strength 5/5 all 4 extremities, Sensation intact all 4 extremities, Plantars down going.  4. Ears and Eyes appear Normal, Conjunctivae clear, PERRLA. Moist Oral Mucosa.  5. Supple Neck, No JVD, No cervical lymphadenopathy appriciated,  No Carotid Bruits.  6. Symmetrical Chest wall movement, + bilateral exp wheezing, no crackles.   7. RRR, No Gallops, Rubs or Murmurs, No Parasternal Heave.  8. Positive Bowel Sounds, Abdomen Soft, No tenderness, No organomegaly appriciated,No rebound -guarding or rigidity.  9.  No Cyanosis, Normal Skin Turgor, No Skin Rash or Bruise.  10. Good muscle tone,  joints appear normal , no effusions, Normal ROM.  11. No Palpable Lymph Nodes in Neck or Axillae     Data Review:    CBC Recent Labs  Lab 01/24/17 1457  WBC 9.1  HGB 14.7  HCT 44.0  PLT 247  MCV 90.2  MCH 30.1  MCHC 33.4  RDW 13.2  LYMPHSABS 2.0  MONOABS 1.0  EOSABS 0.1  BASOSABS 0.0    ------------------------------------------------------------------------------------------------------------------  Chemistries  Recent Labs  Lab 01/24/17 1457  NA 137  K 3.3*  CL 98*  CO2 28  GLUCOSE 142*  BUN 5*  CREATININE 0.65  CALCIUM 8.8*  AST 24  ALT 17  ALKPHOS 73  BILITOT 0.9   ------------------------------------------------------------------------------------------------------------------ estimated creatinine clearance is 71.7 mL/min (by C-G formula based on SCr of 0.65 mg/dL). ------------------------------------------------------------------------------------------------------------------ No results for input(s): TSH, T4TOTAL, T3FREE, THYROIDAB in the last 72 hours.  Invalid input(s): FREET3  Coagulation profile Recent Labs  Lab 01/24/17 1457  INR 0.98   ------------------------------------------------------------------------------------------------------------------- No results for input(s): DDIMER in the last 72 hours. -------------------------------------------------------------------------------------------------------------------  Cardiac Enzymes No results for input(s): CKMB, TROPONINI, MYOGLOBIN in the last 168 hours.  Invalid input(s): CK ------------------------------------------------------------------------------------------------------------------    Component Value Date/Time   BNP 81.0 01/24/2017 1455     ---------------------------------------------------------------------------------------------------------------  Urinalysis    Component Value Date/Time   COLORURINE YELLOW 01/24/2017 [place] 01/24/2017 1457   LABSPEC 1.005 01/24/2017 1457   PHURINE 7.0 01/24/2017 1457   GLUCOSEU NEGATIVE 01/24/2017 1457   HGBUR NEGATIVE 01/24/2017 1457   BILIRUBINUR NEGATIVE 01/24/2017 1457   KETONESUR NEGATIVE 01/24/2017 1457   PROTEINUR NEGATIVE 01/24/2017 1457   UROBILINOGEN 0.2 01/03/2014 1819   NITRITE NEGATIVE  01/24/2017 1457   LEUKOCYTESUR NEGATIVE 01/24/2017 1457    ----------------------------------------------------------------------------------------------------------------   Imaging Results:    Dg Chest 2 View  Result Date: 01/24/2017 CLINICAL DATA:  61 year old female with wheezing and congestion for 2 days. Smoker. EXAM: CHEST  2 VIEW COMPARISON:  08/08/2016 and earlier. FINDINGS: Semi upright AP and lateral views of the chest. Mildly lower lung volumes. Stable cardiac size and mediastinal contours. No pneumothorax, pulmonary edema, pleural effusion or confluent pulmonary opacity. Osteopenia. No acute osseous abnormality identified. Cholecystectomy surgical clips. Negative visible bowel gas pattern. IMPRESSION: No acute cardiopulmonary abnormality. Electronically Signed   By: Genevie Ann M.D.   On: 01/24/2017 17:38   Dg Abd 2 Views  Result Date: 01/24/2017 CLINICAL DATA:  Constipation. EXAM: ABDOMEN - 2 VIEW COMPARISON:  Body CT 09/20/2012 FINDINGS: The bowel gas pattern is normal. There is no evidence of free air. Moderate amount of formed stool in the rectum. Spiral shaped metallic body seen overlying the right sacrum on the frontal views. IMPRESSION: Nonobstructive bowel gas pattern. Spiral shaped metallic body seen overlying the right sacrum. Etiology uncertain. Query prior anterior abdominal wall repair anchor. Electronically Signed   By: Fidela Salisbury M.D.   On: 01/24/2017 17:49   Dg Femur Min 2 Views Right  Result Date: 01/24/2017 CLINICAL DATA:  61 year old female status post fall 3 days ago with continued right hip and upper leg pain. EXAM: RIGHT FEMUR 2 VIEWS COMPARISON:  CT Abdomen and Pelvis 09/20/2012. FINDINGS: The right femoral head is normally located. The right hip joint space appears stable an normal. Calcified right femoral artery atherosclerosis. Visible right hemipelvis intact. Right femoral shaft is intact. Distal right femur is intact. Normal alignment at the right  knee. No right knee joint effusion identified. Mid and distal femoral artery calcified atherosclerosis. Chondrocalcinosis at the right knee. Grossly intact proximal tib-fib. IMPRESSION: 1. No acute fracture or dislocation identified about the right femur. 2. Calcified peripheral vascular disease. Electronically Signed   By: Genevie Ann M.D.   On: 01/24/2017 17:40      Assessment & Plan:    Principal Problem:   COPD exacerbation ([place]) Active Problems:   Diabetes mellitus without complication (H[facility])   Hypokalemia    Copd exacerbation Solumedrol 80mg  iv q8h levaquin 500mg  iv qday spiriva 1puff qday Cont symbicort Albuterol 1 neb q6h and q6h prn   Hypokalemia Replete Check cmp in am  Dm2 fsbs ac and qhs, ISS Cont metformin Cont glucotrol Cont januvia=> tradjenta  Hypertension Cont metoprolol, cont hydrochlorothiazide,   Hyperlipidemia Cont lipitor  Hypothyroidism Cont cytomel  Anxiety Cont xanax Cont trintillex Cont rexulti   DVT Prophylaxis Lovenox - SCDs  AM Labs Ordered, also please review Full Orders  Family Communication: Admission, patients condition and plan of care including tests being ordered have been discussed with the patient  who indicate understanding and agree with the plan and Code Status.  Code Status FULL CODE  Likely DC to  home  Condition GUARDED    Consults called: none  Admission status: inpatient  Time spent in minutes : 45   Jani Gravel M.D on 01/24/2017 at 6:33 PM  Between 7am to 7pm - Pager - [phone]  . After 7pm go to [email or web address] - password [facility]  [facility] - Office  [phone]

## 2017-01-24 NOTE — ED Notes (Signed)
May call report to RN, Aldona Bar at 2000 at [phone]. Thank you!

## 2017-01-24 NOTE — ED Provider Notes (Signed)
[place] DEPT Provider Note   CSN: [id number] Arrival date & time: 01/24/17  1357     History   Chief Complaint Chief Complaint  Patient presents with  . Weakness  . Fever  . Hip Pain    Right     HPI [name] is a 62 y.o. female hx of COPD, smoker, DM, here with constipation, chills, cough, right hip pain. Patient states that she slipped and fell 3 days ago and fell on the right hip. She is walking on the hip but has worsening pain and now has trouble bearing weight on it. She is also constipated for the last 6 days and tried stool softeners with no relief. Patient has been having nonproductive cough and congestion and some family members sick with similar symptoms. She continues to smoke about 1 pack a day. EMS was called today and she was noted to be hypoxic 83% on RA. She is suppose to be on 2L [place] as needed but hasn't been using it. She was admitted 6 months ago for COPD exacerbation.   The history is provided by the patient and a relative.    Past Medical History:  Diagnosis Date  . ADD (attention deficit disorder with hyperactivity)   . Anxiety   . Arthritis    "neck" (06/16/2015)  . Asthma   . Basal cell carcinoma, face   . Cervical cancer ([place])   . Chronic bronchitis ([place])   . 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 COPD (chronic obstructive pulmonary disease) ([place])   . Depression   . GERD (gastroesophageal reflux disease)   . Hypertension   . On home oxygen therapy    "2L at bedtime" (06/16/2015)  . Pneumonia 1980s X 1  . Squamous cell carcinoma, face   . Thyroid cyst   . Type II diabetes mellitus [facility])     Patient Active Problem List   Diagnosis Date Noted  . Chronic diastolic CHF (congestive heart failure) ([place]) 08/08/2016  . Protein-calorie malnutrition, moderate ([place]) 06/16/2015  . Hypokalemia 06/04/2015  . Orthostatic hypotension 06/04/2015  . Syncope 06/04/2015  . Head injury with loss of consciousness ([place])  06/04/2015  . Dehydration 06/04/2015  . Accelerated hypertension 06/04/2015  . Syncope and collapse 06/04/2015  . Pyuria 06/04/2015  . Cigarette smoker 06/04/2015  . Diabetes mellitus without complication ([place]) 93/81/0175  . HLD (hyperlipidemia) 01/03/2014  . COPD exacerbation ([place]) 01/03/2014  . Depression   . Hypertension   . COPD GOLD II/ still smoking    . Asthma   . ADHD (attention deficit hyperactivity disorder)   . Chest pain 12/15/2013  . GERD (gastroesophageal reflux disease)   . Anxiety   . Bronchitis   . Skin lesion of right arm 10/20/2011  . Post op infection 03/24/2011  . Incisional hernia 01/20/2011  . Wound infection after surgery 09/09/2010    Past Surgical History:  Procedure Laterality Date  . ABDOMINAL HYSTERECTOMY    . ANTERIOR CERVICAL DECOMP/DISCECTOMY FUSION    . BACK SURGERY    . BASAL CELL CARCINOMA EXCISION     "face"  . COLECTOMY    . EXCISIONAL HEMORRHOIDECTOMY    . HERNIA REPAIR  X 3   incisional/ventral hernia repair "in my stomach where the womb is"  . LAPAROSCOPIC CHOLECYSTECTOMY    . OOPHORECTOMY  2006   cervical cancer  . RECTOVAGINAL FISTULA CLOSURE  2012  . SQUAMOUS CELL CARCINOMA EXCISION    . TUBAL LIGATION    .  VULVA SURGERY  2006   Cancer in situ  . WRIST SURGERY Right X 1   "accidentially cut it"  . WRIST SURGERY Left    "don't remember why"    OB History    No data available       Home Medications    Prior to Admission medications   Medication Sig Start Date End Date Taking? Authorizing Provider  alprazolam Duanne Moron) 2 MG tablet Take 0.5 tablets (1 mg total) by mouth at bedtime as needed for sleep or anxiety (Can take up to 3 thimes daily prn). Patient takes 1/2 tablet by mouth four times a day Patient taking differently: Take 1-2 mg by mouth 4 (four) times daily - after meals and at bedtime. Patient takes 1/2 tab three times daily and 1 tab at bedtime 01/05/14  Yes Theodis Blaze, MD  atorvastatin (LIPITOR) 20 MG  tablet TK 1 T PO D ATN 01/16/17  Yes [provider]  glipiZIDE (GLUCOTROL) 10 MG tablet Take 10 mg by mouth 2 (two) times daily.    Yes [provider]  hydrochlorothiazide (HYDRODIURIL) 25 MG tablet TK 1 T PO D 11/15/16  Yes [provider]  JANUVIA 50 MG tablet TK 1 T PO D 01/16/17  Yes [provider]  metFORMIN (GLUCOPHAGE) 1000 MG tablet TK 1 T PO BID 11/15/16  Yes [provider]  metoprolol succinate (TOPROL-XL) 100 MG 24 hr tablet TK 1 T PO D 01/16/17  Yes [provider]  nortriptyline (PAMELOR) 25 MG capsule Take 2 capsules (50 mg total) by mouth at bedtime. Patient taking differently: Take 75 mg by mouth at bedtime.  06/19/15  Yes Eugenie Filler, MD  potassium chloride SA (K-DUR,KLOR-CON) 20 MEQ tablet Take 1 tablet (20 mEq total) by mouth daily. 08/09/16  Yes Jani Gravel, MD  REXULTI 4 MG TABS Take 4 mg by mouth every morning. 03/11/15  Yes [provider]  TRINTELLIX 20 MG TABS TK 1 T PO QAM 01/01/17  Yes [provider]  venlafaxine XR (EFFEXOR-XR) 150 MG 24 hr capsule Take 150 mg by mouth every morning. 05/13/15  Yes [provider]  albuterol (PROVENTIL HFA;VENTOLIN HFA) 108 (90 BASE) MCG/ACT inhaler Inhale 2 puffs into the lungs 2 (two) times daily as needed for wheezing.    [provider]  albuterol (PROVENTIL) (2.5 MG/3ML) 0.083% nebulizer solution Take 3 mLs (2.5 mg total) by nebulization every 4 (four) hours as needed for wheezing or shortness of breath. 08/09/16   Jani Gravel, MD  budesonide-formoterol [facility]) 160-4.5 MCG/ACT inhaler Inhale 2 puffs into the lungs 2 (two) times daily. 10/12/16   Tanda Rockers, MD  dextromethorphan-guaiFENesin (MUCINEX DM) 30-600 MG 12hr tablet Take 2 tablets by mouth 2 (two) times daily. Take for 4 days then stop. Patient not taking: Reported on 08/08/2016 06/19/15   Eugenie Filler, MD  fluticasone [facility]) 50 MCG/ACT nasal spray Place 2 sprays into both  nostrils daily. Take for 5 days then stop. Patient not taking: Reported on 08/08/2016 06/19/15   Eugenie Filler, MD  HYDROcodone-acetaminophen [facility]) 10-325 MG tablet Take 1 tablet by mouth every 6 (six) hours as needed for moderate pain.  03/05/15   [provider]  liothyronine (CYTOMEL) 25 MCG tablet Take 25 mcg by mouth every morning. 05/22/16   [provider]  loratadine (CLARITIN) 10 MG tablet Take 1 tablet (10 mg total) by mouth daily. Patient not taking: Reported on 08/08/2016 06/19/15   Eugenie Filler,  MD  magnesium oxide (MAG-OX) 400 (241.3 Mg) MG tablet Take 1 tablet (400 mg total) by mouth 2 (two) times daily. For 7 days Patient not taking: Reported on 08/08/2016 06/05/15   Bonnielee Haff, MD  metFORMIN (GLUCOPHAGE) 500 MG tablet Take 500 mg by mouth 2 (two) times daily with a meal.    [provider]  metoprolol tartrate (LOPRESSOR) 25 MG tablet Take 0.5 tablets (12.5 mg total) by mouth 2 (two) times daily. Patient not taking: Reported on 01/24/2017 06/19/15   Eugenie Filler, MD  nefazodone (SERZONE) 100 MG tablet Take 200 mg by mouth 2 (two) times daily. 07/26/16   [provider]  nicotine (NICODERM CQ - DOSED IN MG/24 HOURS) 21 mg/24hr patch Place 1 patch (21 mg total) onto the skin daily. Patient not taking: Reported on 01/24/2017 08/10/16   Jani Gravel, MD  Respiratory Therapy Supplies (FLUTTER) DEVI Use as directed 10/12/16   Tanda Rockers, MD  tiotropium (SPIRIVA HANDIHALER) 18 MCG inhalation capsule Place 1 capsule (18 mcg total) into inhaler and inhale daily. Patient not taking: Reported on 08/08/2016 06/19/15   Eugenie Filler, MD  Vitamin D, Ergocalciferol, (DRISDOL) 50000 UNITS CAPS Take 50,000 Units by mouth every 7 (seven) days. On thursday    [provider]    Family History Family History  Problem Relation Age of Onset  . COPD Father   . Cancer Father        lung  . Cancer Paternal Aunt        lung    Social  History Social History   Tobacco Use  . Smoking status: Current Every Day Smoker    Packs/day: 1.00    Years: 47.00    Pack years: 47.00    Types: Cigarettes  . Smokeless tobacco: Never Used  Substance Use Topics  . Alcohol use: No  . Drug use: No     Allergies   Contrast media [iodinated diagnostic agents]; Doxycycline; Erythromycin; Penicillins; and Metronidazole   Review of Systems Review of Systems  Constitutional: Positive for fever.  Respiratory: Positive for cough.   Neurological: Positive for weakness.  All other systems reviewed and are negative.    Physical Exam Updated Vital Signs BP (!) 168/97 (BP Location: Right Arm)   Pulse 98   Temp 99.9 F (37.7 C) (Rectal)   Resp 20   Ht 5\' 5"  (1.651 m)   Wt 70.3 kg (155 lb)   SpO2 (!) 85%   BMI 25.79 kg/m   Physical Exam  Constitutional:  Chronically ill, dehydrated   HENT:  Head: Normocephalic.  MM dry   Eyes: Conjunctivae and EOM are normal. Pupils are equal, round, and reactive to light.  Neck: Normal range of motion. Neck supple.  Cardiovascular: Normal rate, regular rhythm and normal heart sounds.  Pulmonary/Chest:  Diminished bilateral bases, minimal wheezing throughout   Abdominal: Soft. Bowel sounds are normal. She exhibits no distension.  Genitourinary:  Genitourinary Comments: Stool impaction.   Musculoskeletal:  Dec ROM R hip but no obvious shortening or deformity   Neurological: She is alert.  Tired, moving all extremities   Skin: Skin is warm.  There is some yeast infection under L breast, L sided lower abdomen   Psychiatric:  Unable   Nursing note and vitals reviewed.    ED Treatments / Results  Labs (all labs ordered are listed, but only abnormal results are displayed) Labs Reviewed  COMPREHENSIVE METABOLIC PANEL - Abnormal; Notable for the following components:  Result Value   Potassium 3.3 (*)    Chloride 98 (*)    Glucose, Bld 142 (*)    BUN 5 (*)    Calcium 8.8 (*)     All other components within normal limits  CULTURE, BLOOD (ROUTINE X 2)  CULTURE, BLOOD (ROUTINE X 2)  URINE CULTURE  CBC WITH DIFFERENTIAL/PLATELET  PROTIME-INR  URINALYSIS, ROUTINE W REFLEX MICROSCOPIC  BRAIN NATRIURETIC PEPTIDE  I-STAT CG4 LACTIC ACID, ED  I-STAT TROPONIN, ED    EKG  EKG Interpretation  Date/Time:  Wednesday January 24 2017 15:11:49 EST Ventricular Rate:  88 PR Interval:    QRS Duration: 106 QT Interval:  367 QTC Calculation: 444 R Axis:   62 Text Interpretation:  Sinus rhythm Borderline short PR interval No significant change since last tracing Confirmed by Wandra Arthurs [phone]) on 01/24/2017 3:48:34 PM       Radiology Dg Chest 2 View  Result Date: 01/24/2017 CLINICAL DATA:  61 year old female with wheezing and congestion for 2 days. Smoker. EXAM: CHEST  2 VIEW COMPARISON:  08/08/2016 and earlier. FINDINGS: Semi upright AP and lateral views of the chest. Mildly lower lung volumes. Stable cardiac size and mediastinal contours. No pneumothorax, pulmonary edema, pleural effusion or confluent pulmonary opacity. Osteopenia. No acute osseous abnormality identified. Cholecystectomy surgical clips. Negative visible bowel gas pattern. IMPRESSION: No acute cardiopulmonary abnormality. Electronically Signed   By: Genevie Ann M.D.   On: 01/24/2017 17:38   Dg Abd 2 Views  Result Date: 01/24/2017 CLINICAL DATA:  Constipation. EXAM: ABDOMEN - 2 VIEW COMPARISON:  Body CT 09/20/2012 FINDINGS: The bowel gas pattern is normal. There is no evidence of free air. Moderate amount of formed stool in the rectum. Spiral shaped metallic body seen overlying the right sacrum on the frontal views. IMPRESSION: Nonobstructive bowel gas pattern. Spiral shaped metallic body seen overlying the right sacrum. Etiology uncertain. Query prior anterior abdominal wall repair anchor. Electronically Signed   By: Fidela Salisbury M.D.   On: 01/24/2017 17:49   Dg Femur Min 2 Views Right  Result  Date: 01/24/2017 CLINICAL DATA:  61 year old female status post fall 3 days ago with continued right hip and upper leg pain. EXAM: RIGHT FEMUR 2 VIEWS COMPARISON:  CT Abdomen and Pelvis 09/20/2012. FINDINGS: The right femoral head is normally located. The right hip joint space appears stable an normal. Calcified right femoral artery atherosclerosis. Visible right hemipelvis intact. Right femoral shaft is intact. Distal right femur is intact. Normal alignment at the right knee. No right knee joint effusion identified. Mid and distal femoral artery calcified atherosclerosis. Chondrocalcinosis at the right knee. Grossly intact proximal tib-fib. IMPRESSION: 1. No acute fracture or dislocation identified about the right femur. 2. Calcified peripheral vascular disease. Electronically Signed   By: Genevie Ann M.D.   On: 01/24/2017 17:40    Procedures Procedures (including critical care time)  Medications Ordered in ED Medications  sodium phosphate (FLEET) 7-19 GM/118ML enema 1 enema (not administered)  sodium chloride 0.9 % bolus 1,000 mL (1,000 mLs Intravenous New Bag/Given 01/24/17 1537)  albuterol (PROVENTIL) (2.5 MG/3ML) 0.083% nebulizer solution 5 mg (5 mg Nebulization Given 01/24/17 1552)  ipratropium (ATROVENT) nebulizer solution 0.5 mg (0.5 mg Nebulization Given 01/24/17 1552)  methylPREDNISolone sodium succinate (SOLU-MEDROL) 125 mg/2 mL injection 125 mg (125 mg Intravenous Given 01/24/17 1553)     Initial Impression / Assessment and Plan / ED Course  I have reviewed the triage vital signs and the nursing notes.  Pertinent labs &  imaging results that were available during my care of the patient were reviewed by me and considered in my medical decision making (see chart for details).    KAYLIAH TINDOL is a 61 y.o. female here with fall, constipation, hypoxia. Likely has COPD exacerbation vs pneumonia. Low grade temp 100 in the ED, hypoxic to 85%. Will get labs, lactate, cultures, CXR, UA. Will  get abdominal xray, hip xray. Will likely admit.   6:44 PM Had large BM after enema. More wheezing after nebs. CXR showed no pneumonia. No hip fracture on xray. Will admit for hypoxia from COPD exacerbation. Social consulted due to concern for living conditions at home.   Final Clinical Impressions(s) / ED Diagnoses   Final diagnoses:  Constipation    ED Discharge Orders    None       Drenda Freeze, MD 01/24/17 [phone]

## 2017-01-24 NOTE — ED Notes (Signed)
Called transport for pt to be taken to the floor. ETA 10-15 min.

## 2017-01-24 NOTE — ED Notes (Signed)
Pt had BM.

## 2017-01-24 NOTE — ED Triage Notes (Signed)
Pt BIB from home with family with c/o right hip pain from a fall 3 days ago. Per family she uses a walker to get around and was not evaluated at the time from the fall. Per the family report to ems states that she has been slower than usual, cough, and congestion with some increased weakness. Per ems all family members report being sick with a cold as well. Patient has a severe yeast infection under left breast which patient reports having for sometime now. VSS: 138/88, 89, 99.0 typanic, 18, ST, 93% 4l/[place], cbg 176 #20 LAFA. Patient was given 100 mcg fentanyl at 0230 and 544ml bolus.

## 2017-01-25 ENCOUNTER — Encounter (HOSPITAL_COMMUNITY): Payer: Self-pay

## 2017-01-25 ENCOUNTER — Other Ambulatory Visit: Payer: Self-pay

## 2017-01-25 DIAGNOSIS — E876 Hypokalemia: Secondary | ICD-10-CM

## 2017-01-25 DIAGNOSIS — J441 Chronic obstructive pulmonary disease with (acute) exacerbation: Principal | ICD-10-CM

## 2017-01-25 DIAGNOSIS — K5900 Constipation, unspecified: Secondary | ICD-10-CM

## 2017-01-25 DIAGNOSIS — E119 Type 2 diabetes mellitus without complications: Secondary | ICD-10-CM

## 2017-01-25 LAB — CBC
HCT: 41.9 % (ref 36.0–46.0)
HEMOGLOBIN: 13.8 g/dL (ref 12.0–15.0)
MCH: 30 pg (ref 26.0–34.0)
MCHC: 32.9 g/dL (ref 30.0–36.0)
MCV: 91.1 fL (ref 78.0–100.0)
PLATELETS: 227 10*3/uL (ref 150–400)
RBC: 4.6 MIL/uL (ref 3.87–5.11)
RDW: 13.4 % (ref 11.5–15.5)
WBC: 7.8 10*3/uL (ref 4.0–10.5)

## 2017-01-25 LAB — COMPREHENSIVE METABOLIC PANEL
ALBUMIN: 3.4 g/dL — AB (ref 3.5–5.0)
ALK PHOS: 62 U/L (ref 38–126)
ALT: 16 U/L (ref 14–54)
AST: 19 U/L (ref 15–41)
Anion gap: 9 (ref 5–15)
BILIRUBIN TOTAL: 0.8 mg/dL (ref 0.3–1.2)
BUN: 7 mg/dL (ref 6–20)
CALCIUM: 8.7 mg/dL — AB (ref 8.9–10.3)
CO2: 27 mmol/L (ref 22–32)
CREATININE: 0.52 mg/dL (ref 0.44–1.00)
Chloride: 101 mmol/L (ref 101–111)
GFR calc Af Amer: >60 mL/min (ref >60)
GFR calc non Af Amer: >60 mL/min (ref >60)
GLUCOSE: 253 mg/dL — AB (ref 65–99)
Potassium: 3.5 mmol/L (ref 3.5–5.1)
SODIUM: 137 mmol/L (ref 135–145)
TOTAL PROTEIN: 7.2 g/dL (ref 6.5–8.1)

## 2017-01-25 LAB — GLUCOSE, CAPILLARY
GLUCOSE-CAPILLARY: 222 mg/dL — AB (ref 65–99)
GLUCOSE-CAPILLARY: 197 mg/dL — AB (ref 65–99)
Glucose-Capillary: 180 mg/dL — ABNORMAL HIGH (ref 65–99)
Glucose-Capillary: 192 mg/dL — ABNORMAL HIGH (ref 65–99)

## 2017-01-25 MED ORDER — INSULIN ASPART 100 UNIT/ML ~~LOC~~ SOLN
0.0000 [IU] | Freq: Three times a day (TID) | SUBCUTANEOUS | Status: DC
  Administered 2017-01-25: 7 [IU] via SUBCUTANEOUS
  Administered 2017-01-25 (×2): 4 [IU] via SUBCUTANEOUS
  Administered 2017-01-26: 15 [IU] via SUBCUTANEOUS
  Administered 2017-01-26: 3 [IU] via SUBCUTANEOUS
  Administered 2017-01-26 – 2017-01-27 (×2): 4 [IU] via SUBCUTANEOUS
  Administered 2017-01-27 (×2): 7 [IU] via SUBCUTANEOUS

## 2017-01-25 MED ORDER — METHYLPREDNISOLONE SODIUM SUCC 125 MG IJ SOLR
60.0000 mg | Freq: Two times a day (BID) | INTRAMUSCULAR | Status: DC
  Administered 2017-01-25 – 2017-01-26 (×2): 60 mg via INTRAVENOUS
  Filled 2017-01-25 (×2): qty 2

## 2017-01-25 MED ORDER — INSULIN ASPART 100 UNIT/ML ~~LOC~~ SOLN: 0.0000 [IU] | Freq: Every day | SUBCUTANEOUS | Status: DC

## 2017-01-25 MED ORDER — SENNA 8.6 MG PO TABS
1.0000 | ORAL_TABLET | Freq: Every day | ORAL | Status: DC
  Administered 2017-01-25 – 2017-01-26 (×2): 8.6 mg via ORAL
  Filled 2017-01-25 (×2): qty 1

## 2017-01-25 NOTE — Progress Notes (Signed)
Nutrition Brief Note  Patient identified on the Malnutrition Screening Tool (MST) Report  Patient with stable weight since April 2017.  Wt Readings from Last 15 Encounters:  01/25/17 156 lb 1.4 oz (70.8 kg)  10/12/16 150 lb (68 kg)  08/09/16 160 lb (72.6 kg)  06/17/15 154 lb 12.8 oz (70.2 kg)  06/05/15 153 lb (69.4 kg)  03/30/15 167 lb (75.8 kg)  05/08/14 195 lb (88.5 kg)  04/22/14 195 lb (88.5 kg)  02/10/14 199 lb 3.2 oz (90.4 kg)  02/11/14 196 lb (88.9 kg)  01/06/14 209 lb 1.9 oz (94.9 kg)  01/04/14 206 lb 1.6 oz (93.5 kg)  12/21/13 200 lb (90.7 kg)  12/15/13 208 lb (94.3 kg)  05/07/13 184 lb 12.8 oz (83.8 kg)    Body mass index is 25.97 kg/m. Patient meets criteria for overweight based on current BMI.   Current diet order is Heart Healthy/CHO modified. Labs and medications reviewed.   No nutrition interventions warranted at this time. If nutrition issues arise, please consult RD.   Clayton Bibles, MS, RD, [place] Dietitian Pager: [phone] After Hours Pager: [phone]

## 2017-01-25 NOTE — Progress Notes (Addendum)
PROGRESS NOTE  [name]  SWF:[id number] DOB: 04-07-1955 DOA: 01/24/2017 PCP: Elwyn Reach, MD   Brief Narrative: [name] is a 61 y.o. female 1 ppd smoker with COPD nonadherent to 2LPM home O2, and T2DM who called EMS with right hip pain 3 days after a fall at home, found to be hypoxic and brought to the ED. Hip imaging was negative. She also complained of constipation which was resolved with an enema in the ED. She reported cough and was wheezing with increased work of breathing despite steroids and nebulizers. She was admitted for COPD exacerbation.  Assessment & Plan: Principal Problem:   COPD exacerbation ([place]) Active Problems:   Diabetes mellitus without complication ([place])   Hypokalemia  Acute on chronic hypoxic respiratory failure: Supposed to be on 2LPM, though not adherent to this at home. Requiring more oxygen currently due to COPD exacerbation.  - Treat COPD exacerbation as below.  - Continue supplemental oxygen as needed, will need to qualify prior to discharge.   COPD exacerbation:  - Continue steroids, wheezing improved. With DM and anxiety will decrease dose of solumedrol.  - Started empiric levaquin at admission. Has multiple allergies including PCN and doxycycline. - Continue dulera for symbicort, spiriva, and albuterol scheduled and prn.   Hypokalemia: Resolved with replacement.  - Recheck in AM  T2DM: Last HbA1c 7.8% - DC metformin and [facility] - Continue gliptin - SSI ordered, currently hyperglycemic, will augment to resistant SSI, cont HS coverage.    Hypertension: Chronic, stable - Continue metoprolol, HCTZ - Heart healthy/carb-mod diet  Hyperlipidemia: Chronic, stable.  - Continue lipitor  Hypothyroidism: Last TSH was 0.094.  - Recheck TSH, would reflex to free T3, T4 if remains suppressed.  - Continue cytomel for now   Anxiety/depression: Affecting respiratory effort.  - Continue scheduled xanax - Continue brexipiprazole,  nortriptyline, vortioxetine  Mechanical fall at home with right hip pain: Neg XR's - PT eval  Constipation: Resolved with fleet enema in ED.  - Start daily bowel regimen   DVT prophylaxis: Lovenox Code Status: Full Family Communication: None at bedside this AM Disposition Plan: Continue inpatient management. PT eval ordered  Consultants:   None  Procedures:   None  Antimicrobials:  Levaquin 12/12 >>    Subjective: Breathing about the same as at admission. Denies significant right hip pain at this time. Tearful and wants to go home, had not received xanax since arrival at time of evaluation this AM.   Objective: BP (!) 156/83 (BP Location: Right Arm)   Pulse 82   Temp 98 F (36.7 C) (Oral)   Resp 18   Ht 5\' 5"  (1.651 m)   Wt 70.8 kg (156 lb 1.4 oz)   SpO2 92%   BMI 25.97 kg/m   Gen: 61 y.o. female appearing anxious but in no distress Pulm: Non-labored tachypnea with scattered end-expiratory wheezing.  CV: Regular rate and rhythm. No murmur, rub, or gallop. No JVD, no pedal edema. GI: Abdomen soft, non-tender, non-distended, with normoactive bowel sounds. No organomegaly or masses felt. Ext: Warm, no deformities Skin: No rashes, lesions no ulcers Neuro: Alert and oriented. No focal neurological deficits. Psych: Judgement and insight appear fair. Mood anxious with congruent affect.   Data Reviewed: I have personally reviewed following labs and imaging studies  CBC: Recent Labs  Lab 01/24/17 1457 01/25/17 0425  WBC 9.1 7.8  NEUTROABS 5.9  --   HGB 14.7 13.8  HCT 44.0 41.9  MCV 90.2 91.1  PLT 247  706   Basic Metabolic Panel: Recent Labs  Lab 01/24/17 1457 01/25/17 0425  NA 137 137  K 3.3* 3.5  CL 98* 101  CO2 28 27  GLUCOSE 142* 253*  BUN 5* 7  CREATININE 0.65 0.52  CALCIUM 8.8* 8.7*   GFR: Estimated Creatinine Clearance: 71.9 mL/min (by C-G formula based on SCr of 0.52 mg/dL). Liver Function Tests: Recent Labs  Lab 01/24/17 1457  01/25/17 0425  AST 24 19  ALT 17 16  ALKPHOS 73 62  BILITOT 0.9 0.8  PROT 7.2 7.2  ALBUMIN 3.5 3.4*   No results for input(s): LIPASE, AMYLASE in the last 168 hours. No results for input(s): AMMONIA in the last 168 hours. Coagulation Profile: Recent Labs  Lab 01/24/17 1457  INR 0.98   Cardiac Enzymes: No results for input(s): CKTOTAL, CKMB, CKMBINDEX, TROPONINI in the last 168 hours. BNP (last 3 results) No results for input(s): PROBNP in the last 8760 hours. HbA1C: No results for input(s): HGBA1C in the last 72 hours. CBG: Recent Labs  Lab 01/24/17 2347 01/25/17 0818 01/25/17 1146  GLUCAP 230* 222* 197*   Lipid Profile: No results for input(s): CHOL, HDL, LDLCALC, TRIG, CHOLHDL, LDLDIRECT in the last 72 hours. Thyroid Function Tests: No results for input(s): TSH, T4TOTAL, FREET4, T3FREE, THYROIDAB in the last 72 hours. Anemia Panel: No results for input(s): VITAMINB12, FOLATE, FERRITIN, TIBC, IRON, RETICCTPCT in the last 72 hours. Urine analysis:    Component Value Date/Time   COLORURINE YELLOW 01/24/2017 [place] 01/24/2017 1457   LABSPEC 1.005 01/24/2017 1457   PHURINE 7.0 01/24/2017 [place] 01/24/2017 1457   HGBUR NEGATIVE 01/24/2017 1457   BILIRUBINUR NEGATIVE 01/24/2017 1457   KETONESUR NEGATIVE 01/24/2017 1457   PROTEINUR NEGATIVE 01/24/2017 1457   UROBILINOGEN 0.2 01/03/2014 1819   NITRITE NEGATIVE 01/24/2017 1457   LEUKOCYTESUR NEGATIVE 01/24/2017 1457   Recent Results (from the past 240 hour(s))  Culture, blood (Routine x 2)     Status: None (Preliminary result)   Collection Time: 01/24/17  2:57 PM  Result Value Ref Range Status   Specimen Description BLOOD LEFT ARM  Final   Special Requests   Final    BOTTLES DRAWN AEROBIC AND ANAEROBIC Blood Culture adequate volume   Culture   Final    NO GROWTH < 24 HOURS Performed at [facility], [place] [address], [place], [place] [address]    Report Status PENDING   Incomplete  Culture, blood (Routine x 2)     Status: None (Preliminary result)   Collection Time: 01/24/17  3:20 PM  Result Value Ref Range Status   Specimen Description BLOOD RIGHT HAND  Final   Special Requests IN PEDIATRIC BOTTLE Blood Culture adequate volume  Final   Culture   Final    NO GROWTH < 24 HOURS Performed at [facility], [place] [address], [place], [place] [address]    Report Status PENDING  Incomplete      Radiology Studies: Dg Chest 2 View  Result Date: 01/24/2017 CLINICAL DATA:  61 year old female with wheezing and congestion for 2 days. Smoker. EXAM: CHEST  2 VIEW COMPARISON:  08/08/2016 and earlier. FINDINGS: Semi upright AP and lateral views of the chest. Mildly lower lung volumes. Stable cardiac size and mediastinal contours. No pneumothorax, pulmonary edema, pleural effusion or confluent pulmonary opacity. Osteopenia. No acute osseous abnormality identified. Cholecystectomy surgical clips. Negative visible bowel gas pattern. IMPRESSION: No acute cardiopulmonary abnormality. Electronically Signed  By: Genevie Ann M.D.   On: 01/24/2017 17:38   Dg Abd 2 Views  Result Date: 01/24/2017 CLINICAL DATA:  Constipation. EXAM: ABDOMEN - 2 VIEW COMPARISON:  Body CT 09/20/2012 FINDINGS: The bowel gas pattern is normal. There is no evidence of free air. Moderate amount of formed stool in the rectum. Spiral shaped metallic body seen overlying the right sacrum on the frontal views. IMPRESSION: Nonobstructive bowel gas pattern. Spiral shaped metallic body seen overlying the right sacrum. Etiology uncertain. Query prior anterior abdominal wall repair anchor. Electronically Signed   By: Fidela Salisbury M.D.   On: 01/24/2017 17:49   Dg Femur Min 2 Views Right  Result Date: 01/24/2017 CLINICAL DATA:  61 year old female status post fall 3 days ago with continued right hip and upper leg pain. EXAM: RIGHT FEMUR 2 VIEWS COMPARISON:  CT Abdomen and Pelvis 09/20/2012. FINDINGS: The right  femoral head is normally located. The right hip joint space appears stable an normal. Calcified right femoral artery atherosclerosis. Visible right hemipelvis intact. Right femoral shaft is intact. Distal right femur is intact. Normal alignment at the right knee. No right knee joint effusion identified. Mid and distal femoral artery calcified atherosclerosis. Chondrocalcinosis at the right knee. Grossly intact proximal tib-fib. IMPRESSION: 1. No acute fracture or dislocation identified about the right femur. 2. Calcified peripheral vascular disease. Electronically Signed   By: Genevie Ann M.D.   On: 01/24/2017 17:40    Scheduled Meds: . albuterol  2.5 mg Nebulization Q6H  . ALPRAZolam  1 mg Oral TID AC   And  . ALPRAZolam  2 mg Oral QHS  . atorvastatin  20 mg Oral q1800  . Brexpiprazole  4 mg Oral q morning - 10a  . enoxaparin (LOVENOX) injection  40 mg Subcutaneous QHS  . hydrochlorothiazide  25 mg Oral Daily  . insulin aspart  0-20 Units Subcutaneous TID WC  . insulin aspart  0-5 Units Subcutaneous QHS  . linagliptin  5 mg Oral Daily  . liothyronine  25 mcg Oral BH-q7a  . loratadine  10 mg Oral Daily  . methylPREDNISolone (SOLU-MEDROL) injection  60 mg Intravenous Q12H  . metoprolol succinate  100 mg Oral Daily  . mometasone-formoterol  2 puff Inhalation BID  . nortriptyline  75 mg Oral QHS  . potassium chloride SA  20 mEq Oral Daily  . potassium chloride  20 mEq Oral Once  . sodium chloride flush  3 mL Intravenous Q12H  . sodium phosphate  1 enema Rectal Once  . tiotropium  18 mcg Inhalation Daily  . Vitamin D (Ergocalciferol)  50,000 Units Oral Q Thu  . vortioxetine HBr  20 mg Oral Daily   Continuous Infusions: . sodium chloride    . levofloxacin (LEVAQUIN) IV Stopped (01/25/17 0052)     LOS: 1 day   Time spent: 25 minutes.  Vance Gather, MD [facility] Pager [phone]  If 7PM-7AM, please contact night-coverage [email or web address] Password [email or web address] 01/25/2017, 3:25 PM

## 2017-01-26 LAB — BASIC METABOLIC PANEL
Anion gap: 9 (ref 5–15)
BUN: 13 mg/dL (ref 6–20)
CHLORIDE: 97 mmol/L — AB (ref 101–111)
CO2: 29 mmol/L (ref 22–32)
Calcium: 9 mg/dL (ref 8.9–10.3)
Creatinine, Ser: 0.64 mg/dL (ref 0.44–1.00)
GFR calc Af Amer: >60 mL/min (ref >60)
GFR calc non Af Amer: >60 mL/min (ref >60)
GLUCOSE: 201 mg/dL — AB (ref 65–99)
POTASSIUM: 3.7 mmol/L (ref 3.5–5.1)
Sodium: 135 mmol/L (ref 135–145)

## 2017-01-26 LAB — URINE CULTURE: Culture: NO GROWTH

## 2017-01-26 LAB — GLUCOSE, CAPILLARY
GLUCOSE-CAPILLARY: 321 mg/dL — AB (ref 65–99)
GLUCOSE-CAPILLARY: 148 mg/dL — AB (ref 65–99)
GLUCOSE-CAPILLARY: 148 mg/dL — AB (ref 65–99)
Glucose-Capillary: 155 mg/dL — ABNORMAL HIGH (ref 65–99)

## 2017-01-26 LAB — TSH: TSH: 0.08 u[IU]/mL — AB (ref 0.350–4.500)

## 2017-01-26 MED ORDER — LIOTHYRONINE SODIUM 25 MCG PO TABS
12.5000 ug | ORAL_TABLET | ORAL | Status: DC
  Administered 2017-01-27: 12.5 ug via ORAL
  Filled 2017-01-26: qty 1

## 2017-01-26 MED ORDER — LEVOFLOXACIN 500 MG PO TABS
500.0000 mg | ORAL_TABLET | Freq: Every day | ORAL | Status: DC
  Administered 2017-01-26: 500 mg via ORAL
  Filled 2017-01-26: qty 1

## 2017-01-26 MED ORDER — PREDNISONE 20 MG PO TABS
40.0000 mg | ORAL_TABLET | Freq: Every day | ORAL | Status: DC
  Administered 2017-01-27: 40 mg via ORAL
  Filled 2017-01-26: qty 2

## 2017-01-26 MED ORDER — ALBUTEROL SULFATE (2.5 MG/3ML) 0.083% IN NEBU
2.5000 mg | INHALATION_SOLUTION | Freq: Three times a day (TID) | RESPIRATORY_TRACT | Status: DC
  Administered 2017-01-26 – 2017-01-27 (×4): 2.5 mg via RESPIRATORY_TRACT
  Filled 2017-01-26 (×4): qty 3

## 2017-01-26 MED ORDER — METHYLPREDNISOLONE SODIUM SUCC 125 MG IJ SOLR: 60.0000 mg | Freq: Two times a day (BID) | INTRAMUSCULAR | Status: DC

## 2017-01-26 NOTE — Evaluation (Signed)
Physical Therapy Evaluation Patient Details Name: [name] MRN: [id number] DOB: 02-Apr-1955 Today's Date: 01/26/2017   History of Present Illness  61 yo female admitted with COPD exac, R hip pain-fall at home PTA. Hx of DM, COPD, ADD, HTN, falls.   Clinical Impression  On eval, pt required Min assist for mobility. She walked ~60 feet in the hallway. O2 sats dropped to 86% on RA with activity. Pt denied dizziness/lightheadedness. Pt is unsteady and remains at risk for falls. Discussed d/c plan-pt plans to return home. She stated she lives with her granddaughter who can provide assistance as needed. Recommended she use a RW for ambulation safety as well.     Follow Up Recommendations Home health PT;Supervision/Assistance - 24 hour    Equipment Recommendations  None recommended by PT    Recommendations for Other Services       Precautions / Restrictions Precautions Precautions: Fall Precaution Comments: monitor O2 sats Restrictions Weight Bearing Restrictions: No      Mobility  Bed Mobility Overal bed mobility: Needs Assistance Bed Mobility: Supine to Sit     Supine to sit: Supervision     General bed mobility comments: for safety  Transfers Overall transfer level: Needs assistance   Transfers: Sit to/from Stand Sit to Stand: Min assist         General transfer comment: Assist to steady. Pt denied dizziness/lightheadedness  Ambulation/Gait Ambulation/Gait assistance: Min assist Ambulation Distance (Feet): 60 Feet Assistive device: None Gait Pattern/deviations: Step-through pattern;Staggering right;Staggering left;Drifts right/left     General Gait Details: Assist to stabilize throughout ambulation distance. O2 sats dropped to 86% on RA with activity. Pt is unsteady and at risk for falls. Discussed use of RW for ambulation safety  Stairs            Wheelchair Mobility    Modified Rankin (Stroke Patients Only)       Balance Overall balance  assessment: History of Falls                                           Pertinent Vitals/Pain Pain Assessment: No/denies pain    Home Living Family/patient expects to be discharged to:: Private residence Living Arrangements: Other relatives(granddaughter) Available Help at Discharge: Family Type of Home: Mobile home Home Access: Stairs to enter   Technical brewer of Steps: 2 Home Layout: One level Home Equipment: Environmental consultant - 2 wheels      Prior Function Level of Independence: Needs assistance   Gait / Transfers Assistance Needed: Independent but hx of multiple falls  ADL's / Homemaking Assistance Needed: granddaughter performs IADLS        Hand Dominance        Extremity/Trunk Assessment   Upper Extremity Assessment Upper Extremity Assessment: Overall WFL for tasks assessed    Lower Extremity Assessment Lower Extremity Assessment: Generalized weakness    Cervical / Trunk Assessment Cervical / Trunk Assessment: Normal  Communication   Communication: No difficulties  Cognition Arousal/Alertness: Awake/alert Behavior During Therapy: WFL for tasks assessed/performed Overall Cognitive Status: Within Functional Limits for tasks assessed                                        General Comments      Exercises     Assessment/Plan  PT Assessment Patient needs continued PT services  PT Problem List Decreased strength;Decreased mobility;Decreased activity tolerance;Decreased balance;Decreased knowledge of use of DME       PT Treatment Interventions DME instruction;Gait training;Functional mobility training;Therapeutic activities;Balance training;Patient/family education;Therapeutic exercise    PT Goals (Current goals can be found in the Care Plan section)  Acute Rehab PT Goals Patient Stated Goal: home PT Goal Formulation: With patient Time For Goal Achievement: 02/09/17 Potential to Achieve Goals: Good    Frequency  Min 3X/week   Barriers to discharge        Co-evaluation               AM-PAC PT "6 Clicks" Daily Activity  Outcome Measure Difficulty turning over in bed (including adjusting bedclothes, sheets and blankets)?: None Difficulty moving from lying on back to sitting on the side of the bed? : None Difficulty sitting down on and standing up from a chair with arms (e.g., wheelchair, bedside commode, etc,.)?: None Help needed moving to and from a bed to chair (including a wheelchair)?: A Little Help needed walking in hospital room?: A Little Help needed climbing 3-5 steps with a railing? : A Little 6 Click Score: 21    End of Session Equipment Utilized During Treatment: Gait belt Activity Tolerance: Patient tolerated treatment well Patient left: in chair;with call bell/phone within reach;with chair alarm set   PT Visit Diagnosis: Muscle weakness (generalized) (M62.81);Difficulty in walking, not elsewhere classified (R26.2)    Time: 7124-5809 PT Time Calculation (min) (ACUTE ONLY): 28 min   Charges:   PT Evaluation $PT Eval Moderate Complexity: 1 Mod PT Treatments $Gait Training: 8-22 mins   PT G Codes:         Weston Anna, MPT Pager: [phone]

## 2017-01-26 NOTE — Progress Notes (Signed)
PROGRESS NOTE  [name]  UVO:[id number] DOB: 1955-12-20 DOA: 01/24/2017 PCP: Elwyn Reach, MD   Brief Narrative: [name] is a 61 y.o. female 1 ppd smoker with COPD nonadherent to 2LPM home O2, and T2DM who called EMS with right hip pain 3 days after a fall at home, found to be hypoxic and brought to the ED. Hip imaging was negative. She also complained of constipation which was resolved with an enema in the ED. She reported cough and was wheezing with increased work of breathing despite steroids and nebulizers. She was admitted for COPD exacerbation.  Assessment & Plan: Principal Problem:   COPD exacerbation ([place]) Active Problems:   Diabetes mellitus without complication ([place])   Hypokalemia  Acute on chronic hypoxic respiratory failure: Supposed to be on 2LPM, though not adherent to this at home. Initially required higher flow, now back to 2LPM, desaturated with ambulation which is not new based on pulmonology note from Aug 2018. - Treat COPD exacerbation as below.  - Continue supplemental oxygen at home, discussed this in detail with pt and family.  COPD exacerbation:  - Continue steroids, wheezing improved, switch to prednisone, DC solumedrol.  - Started empiric levaquin at admission. Has multiple allergies including PCN and doxycycline. - Continue dulera for symbicort, spiriva, and albuterol scheduled and prn.   Hypokalemia: Resolved with replacement.   T2DM: Last HbA1c 7.8% - DC metformin and [facility] - Continue gliptin - Resistant SSI, cont HS coverage.    Hypertension: Chronic, stable - Continue metoprolol, HCTZ - Heart healthy/carb-mod diet  Hyperlipidemia: Chronic, stable.  - Continue lipitor  Hypothyroidism: Last TSH was 0.094, remains suppressed at 0.080. Will check free T3 and free T4.  - Continue cytomel for now, will give 12.63mcg dose tomorrow, would likely benefit from decreasing dose per PCP with quick follow up.    Anxiety/depression:  Severe. Has been refractory to many treatments per psychiatry as outpatient.  - Continue scheduled xanax - Continue brexipiprazole, nortriptyline, vortioxetine  Mechanical fall at home with right hip pain: Neg XR's - PT eval: Recommends 24hr supervision which the granddaughter will provide and HH-PT which will be arranged.   Constipation: Resolved with fleet enema in ED.  - Monitor  DVT prophylaxis: Lovenox Code Status: Full Family Communication: None at bedside this AM Disposition Plan: DC home w/HH-PT and 24-hr supervision by granddaughter if stable in AM  Consultants:   None  Procedures:   None  Antimicrobials:  Levaquin 12/12 >>    Subjective: Pt reports improved breathing, but not near baseline per pt or granddaughter. Very tired after working with PT.  Objective: BP (!) 166/76 (BP Location: Left Arm)   Pulse 75   Temp 98.1 F (36.7 C) (Oral)   Resp 18   Ht 5\' 5"  (1.651 m)   Wt 70.5 kg (155 lb 8 oz)   SpO2 95%   BMI 25.88 kg/m   Gen: 61 y.o. female appearing tired but in no distress Pulm: Non-labored with regular rate, mildly prolonged expiration with improved air exchange. No wheezes.  CV: Regular rate and rhythm. No murmur, rub, or gallop. No JVD, no pedal edema. GI: Abdomen soft, non-tender, non-distended, with normoactive bowel sounds. No organomegaly or masses felt. Ext: Warm, no deformities Skin: No rashes, lesions no ulcers Neuro: Alert and oriented. No focal neurological deficits. Psych: Judgement and insight appear intact. Mood anxious with congruent affect.   Data Reviewed: I have personally reviewed following labs and imaging studies  CBC: Recent Labs  Lab 01/24/17 1457 01/25/17 0425  WBC 9.1 7.8  NEUTROABS 5.9  --   HGB 14.7 13.8  HCT 44.0 41.9  MCV 90.2 91.1  PLT 247 643   Basic Metabolic Panel: Recent Labs  Lab 01/24/17 1457 01/25/17 0425 01/26/17 0415  NA 137 137 135  K 3.3* 3.5 3.7  CL 98* 101 97*  CO2 28 27 29   GLUCOSE  142* 253* 201*  BUN 5* 7 13  CREATININE 0.65 0.52 0.64  CALCIUM 8.8* 8.7* 9.0   GFR: Estimated Creatinine Clearance: 71.8 mL/min (by C-G formula based on SCr of 0.64 mg/dL). Liver Function Tests: Recent Labs  Lab 01/24/17 1457 01/25/17 0425  AST 24 19  ALT 17 16  ALKPHOS 73 62  BILITOT 0.9 0.8  PROT 7.2 7.2  ALBUMIN 3.5 3.4*   No results for input(s): LIPASE, AMYLASE in the last 168 hours. No results for input(s): AMMONIA in the last 168 hours. Coagulation Profile: Recent Labs  Lab 01/24/17 1457  INR 0.98   Cardiac Enzymes: No results for input(s): CKTOTAL, CKMB, CKMBINDEX, TROPONINI in the last 168 hours. BNP (last 3 results) No results for input(s): PROBNP in the last 8760 hours. HbA1C: No results for input(s): HGBA1C in the last 72 hours. CBG: Recent Labs  Lab 01/25/17 1146 01/25/17 1639 01/25/17 1953 01/26/17 0721 01/26/17 1118  GLUCAP 197* 180* 192* 155* 321*   Lipid Profile: No results for input(s): CHOL, HDL, LDLCALC, TRIG, CHOLHDL, LDLDIRECT in the last 72 hours. Thyroid Function Tests: Recent Labs    01/26/17 1246  TSH 0.080*   Anemia Panel: No results for input(s): VITAMINB12, FOLATE, FERRITIN, TIBC, IRON, RETICCTPCT in the last 72 hours. Urine analysis:    Component Value Date/Time   COLORURINE YELLOW 01/24/2017 [place] 01/24/2017 1457   LABSPEC 1.005 01/24/2017 1457   PHURINE 7.0 01/24/2017 [place] 01/24/2017 1457   HGBUR NEGATIVE 01/24/2017 1457   BILIRUBINUR NEGATIVE 01/24/2017 1457   KETONESUR NEGATIVE 01/24/2017 1457   PROTEINUR NEGATIVE 01/24/2017 1457   UROBILINOGEN 0.2 01/03/2014 1819   NITRITE NEGATIVE 01/24/2017 1457   LEUKOCYTESUR NEGATIVE 01/24/2017 1457   Recent Results (from the past 240 hour(s))  Culture, blood (Routine x 2)     Status: None (Preliminary result)   Collection Time: 01/24/17  2:57 PM  Result Value Ref Range Status   Specimen Description BLOOD LEFT ARM  Final   Special  Requests   Final    BOTTLES DRAWN AEROBIC AND ANAEROBIC Blood Culture adequate volume   Culture   Final    NO GROWTH 2 DAYS Performed at [facility], [place] [address], [place], [place] [address]    Report Status PENDING  Incomplete  Urine culture     Status: None   Collection Time: 01/24/17  2:57 PM  Result Value Ref Range Status   Specimen Description URINE, CATHETERIZED  Final   Special Requests NONE  Final   Culture   Final    NO GROWTH Performed at [facility], [address] [place], [place] [address]    Report Status 01/26/2017 FINAL  Final  Culture, blood (Routine x 2)     Status: None (Preliminary result)   Collection Time: 01/24/17  3:20 PM  Result Value Ref Range Status   Specimen Description BLOOD RIGHT HAND  Final   Special Requests IN PEDIATRIC BOTTLE Blood Culture adequate volume  Final   Culture   Final    NO GROWTH  2 DAYS Performed at [facility], [place] [address], [place], [place] [address]    Report Status PENDING  Incomplete      Radiology Studies: Dg Chest 2 View  Result Date: 01/24/2017 CLINICAL DATA:  61 year old female with wheezing and congestion for 2 days. Smoker. EXAM: CHEST  2 VIEW COMPARISON:  08/08/2016 and earlier. FINDINGS: Semi upright AP and lateral views of the chest. Mildly lower lung volumes. Stable cardiac size and mediastinal contours. No pneumothorax, pulmonary edema, pleural effusion or confluent pulmonary opacity. Osteopenia. No acute osseous abnormality identified. Cholecystectomy surgical clips. Negative visible bowel gas pattern. IMPRESSION: No acute cardiopulmonary abnormality. Electronically Signed   By: Genevie Ann M.D.   On: 01/24/2017 17:38   Dg Abd 2 Views  Result Date: 01/24/2017 CLINICAL DATA:  Constipation. EXAM: ABDOMEN - 2 VIEW COMPARISON:  Body CT 09/20/2012 FINDINGS: The bowel gas pattern is normal. There is no evidence of free air. Moderate amount of formed stool in the rectum. Spiral shaped metallic body  seen overlying the right sacrum on the frontal views. IMPRESSION: Nonobstructive bowel gas pattern. Spiral shaped metallic body seen overlying the right sacrum. Etiology uncertain. Query prior anterior abdominal wall repair anchor. Electronically Signed   By: Fidela Salisbury M.D.   On: 01/24/2017 17:49   Dg Femur Min 2 Views Right  Result Date: 01/24/2017 CLINICAL DATA:  61 year old female status post fall 3 days ago with continued right hip and upper leg pain. EXAM: RIGHT FEMUR 2 VIEWS COMPARISON:  CT Abdomen and Pelvis 09/20/2012. FINDINGS: The right femoral head is normally located. The right hip joint space appears stable an normal. Calcified right femoral artery atherosclerosis. Visible right hemipelvis intact. Right femoral shaft is intact. Distal right femur is intact. Normal alignment at the right knee. No right knee joint effusion identified. Mid and distal femoral artery calcified atherosclerosis. Chondrocalcinosis at the right knee. Grossly intact proximal tib-fib. IMPRESSION: 1. No acute fracture or dislocation identified about the right femur. 2. Calcified peripheral vascular disease. Electronically Signed   By: Genevie Ann M.D.   On: 01/24/2017 17:40    Scheduled Meds: . albuterol  2.5 mg Nebulization TID  . ALPRAZolam  1 mg Oral TID AC   And  . ALPRAZolam  2 mg Oral QHS  . atorvastatin  20 mg Oral q1800  . Brexpiprazole  4 mg Oral q morning - 10a  . enoxaparin (LOVENOX) injection  40 mg Subcutaneous QHS  . hydrochlorothiazide  25 mg Oral Daily  . insulin aspart  0-20 Units Subcutaneous TID WC  . insulin aspart  0-5 Units Subcutaneous QHS  . levofloxacin  500 mg Oral QHS  . linagliptin  5 mg Oral Daily  . liothyronine  25 mcg Oral BH-q7a  . loratadine  10 mg Oral Daily  . methylPREDNISolone (SOLU-MEDROL) injection  60 mg Intravenous Q12H  . metoprolol succinate  100 mg Oral Daily  . mometasone-formoterol  2 puff Inhalation BID  . nortriptyline  75 mg Oral QHS  . potassium  chloride SA  20 mEq Oral Daily  . potassium chloride  20 mEq Oral Once  . [START ON 01/27/2017] predniSONE  40 mg Oral Q breakfast  . senna  1 tablet Oral Daily  . sodium chloride flush  3 mL Intravenous Q12H  . sodium phosphate  1 enema Rectal Once  . tiotropium  18 mcg Inhalation Daily  . Vitamin D (Ergocalciferol)  50,000 Units Oral Q Thu  . vortioxetine HBr  20 mg Oral  Daily   Continuous Infusions: . sodium chloride       LOS: 2 days   Time spent: 25 minutes.  Vance Gather, MD [facility] Pager [phone]  If 7PM-7AM, please contact night-coverage [email or web address] Password [email or web address] 01/26/2017, 5:05 PM

## 2017-01-27 LAB — GLUCOSE, CAPILLARY
Glucose-Capillary: 160 mg/dL — ABNORMAL HIGH (ref 65–99)
Glucose-Capillary: 249 mg/dL — ABNORMAL HIGH (ref 65–99)
Glucose-Capillary: 237 mg/dL — ABNORMAL HIGH (ref 65–99)

## 2017-01-27 LAB — T4, FREE: Free T4: 0.95 ng/dL (ref 0.61–1.12)

## 2017-01-27 MED ORDER — LEVOFLOXACIN 500 MG PO TABS: 500.0000 mg | ORAL_TABLET | Freq: Every day | ORAL | 0 refills | Status: DC

## 2017-01-27 MED ORDER — TIOTROPIUM BROMIDE MONOHYDRATE 18 MCG IN CAPS: 18.0000 ug | ORAL_CAPSULE | Freq: Every day | RESPIRATORY_TRACT | 0 refills | Status: DC

## 2017-01-27 MED ORDER — BUDESONIDE-FORMOTEROL FUMARATE 160-4.5 MCG/ACT IN AERO: 2.0000 | INHALATION_SPRAY | Freq: Two times a day (BID) | RESPIRATORY_TRACT | 0 refills | Status: DC

## 2017-01-27 MED ORDER — ALBUTEROL SULFATE (2.5 MG/3ML) 0.083% IN NEBU: 2.5000 mg | INHALATION_SOLUTION | RESPIRATORY_TRACT | 0 refills | Status: DC | PRN

## 2017-01-27 MED ORDER — PREDNISONE 20 MG PO TABS: ORAL_TABLET | ORAL | 0 refills | Status: DC

## 2017-01-27 NOTE — Care Management Note (Signed)
Case Management Note  Patient Details  Name: [name] MRN: [id number] Date of Birth: 03-Jul-1955  Subjective/Objective:    COPD exacerbation, DM, Hypokalemia                Action/Plan: Discharge Planning: NCM spoke to pt and gave permission to speak to grand- dtr who was on the phone. Offered choice for HH/list provided. Pt requested [facility] for HH. She has RW and neb machine at home. Dtr requesting a light weight manual wheelchair. States pt has been falling at home. She is at home with pt all day to provide assistance. Will need oxygen for home use. Contacted [facility] with new DME orders for dc home today. Grand-dtr will pick up meds prior to coming to pick up pt from pharmacy. Explained  To grand-dtr pt will have portable in room and [facility] will deliver other DME to home today. HH will call to arrange initial visit.   PCP Gala Romney L  Expected Discharge Date:  01/27/17               Expected Discharge Plan:  [place]  In-House Referral:  NA  Discharge planning Services  CM Consult  Post Acute Care Choice:  Home Health Choice offered to:  Adult Children  DME Arranged:  3-N-1, Oxygen, Lightweight manual wheelchair with seat cushion DME Agency:  [place] Arranged:  RN, PT, Nurse's Aide [place] Agency:  [place]  Status of Service:  Completed, signed off  If discussed at [place] of Stay Meetings, dates discussed:    Additional Comments:  Erenest Rasher, RN 01/27/2017, 12:36 PM

## 2017-01-27 NOTE — Progress Notes (Signed)
Pt was alert and lying in bed during our visit. She confirmed she wanted information on AD but said her granddaughter is not there and she wanted her present. Pt does not know when granddaughter will visit. CH gave pt an AD for her review and to go over w/family. Pt was encouraged to request [facility] if she has questions or needs additional support. Pt requested prayer for which she was grateful.  [place], [place] [phone]   01/27/17 1300  Clinical Encounter Type  Visited With Patient

## 2017-01-27 NOTE — Care Management Important Message (Signed)
Important Message  Patient Details  Name: [name] MRN: [id number] Date of Birth: 26-Jan-1956   Medicare Important Message Given:  Yes    Erenest Rasher, RN 01/27/2017, 12:34 PM

## 2017-01-27 NOTE — Progress Notes (Addendum)
Progress Note for the Evaluation of Need for a Wheelchair Patient Name: ____[name]_________________________________        DOB: __2/16/2019______________________________ Diagnosis Codes: ____J44.1_______________________________              Height: __5'5"________        Weight: ___151 lb_______    Manufacturing systems engineer   Patient suffers from _COPD, falls____ which impairs their ability to perform daily activities like                                                                  (diagnosis)  ___dressing, standing, bathing_______ in the home. A ___rolling walker____________________ will not resolve issue with  (toileting, feeding, dressing, grooming, bathing)                                  (cane, walker, crutch)  performing activities of daily living. A wheelchair will allow patient to safely perform daily activities. Patient is not able to  propel themselves in the home using a standard weight wheelchair due to ___weakness, shortness of breath, endurance___________                                      (arm weakness, general weakness, endurance) Patient can self propel in the lightweight wheelchair.     Jonnie Finner RN CCM Case Mgmt phone [phone]  01/27/2017

## 2017-01-27 NOTE — Progress Notes (Signed)
SATURATION QUALIFICATIONS: (This note is used to comply with regulatory documentation for home oxygen)  Patient Saturations on Room Air at Rest = 86%  Patient Saturations on Room Air while Ambulating = unable to tolerate  Patient Saturations on 2 Liters of oxygen - 92%   Please briefly explain why patient needs home oxygen: COPD

## 2017-01-27 NOTE — Discharge Planning (Signed)
RN assessment and VS revealed stability for DC to home with grandaughter and [facility] services.  Informed of suggested FU appts and patient/family agreed to contact to set up (since being discharged on a Sat).  Scripts printed and placed in DC packet.  HH to call family to set up 1st visit for PT and [place].  HH also to deliver Home supplies to home.  Once ride arrives, hope is to get Chaplain to assist with signing medical POA papers with granddaughter and patient.   Then IV and tele will be removed and patient wheeled to front. Granddaughter to transport home via car.   Awaiting ride to arrive.  Report given to [facility], RN - informed of patient/family needs to conclude discharge and will finalize once granddaughter arrives.

## 2017-01-27 NOTE — Discharge Summary (Signed)
Physician Discharge Summary  AURIELLA Diaz RWE:[id number] DOB: 11-03-1955 DOA: 01/24/2017  PCP: Elwyn Reach, MD  Admit date: 01/24/2017 Discharge date: 01/27/2017  Admitted From: Home Disposition: Home   Recommendations for Outpatient Follow-up:  1. Follow up with PCP in 1-2 weeks 2. Follow up with pulmonology, reiterated need for supplemental oxygen and use of breathing treatments at home (was not using these) 3. Follow up with psychiatry, consider addition of counseling.  4. Follow up with dermatology (sees Dr. Allyson Sabal) vs. PCP for biopsy of nonhealing nose wound. This was discussed with pt and family.   Home Health: PT, RN. 24-hr supervision to be provided by the patient's granddaughter.  Equipment/Devices: 2L O2 Discharge Condition: Stable CODE STATUS: Full Diet recommendation: Heart healthy, carb-limited.   Brief/Interim Summary: [name] is a 61 y.o. female 1 ppd smoker with COPD nonadherent to 2LPM home O2, and T2DM who called EMS with right hip pain 3 days after a fall at home, found to be hypoxic and brought to the ED. Hip imaging was negative. She also complained of constipation which was resolved with an enema in the ED. She reported cough and was wheezing with increased work of breathing despite steroids and nebulizers. She was admitted for COPD exacerbation and improved with treatment.  Discharge Diagnoses:  Principal Problem:   COPD exacerbation ([place]) Active Problems:   Diabetes mellitus without complication ([place])   Hypokalemia  Acute on chronic hypoxic respiratory failure: Supposed to be on 2LPM, though not adherent to this at home. Initially required higher flow, now back to 2LPM, desaturated with ambulation which is not new based on pulmonology note from Aug 2018. - Treat COPD exacerbation as below.  - Continue supplemental oxygen at home, discussed this in detail with pt and family.  COPD exacerbation:  - Continue steroids, wheezing improved,  switch to prednisone with stability. Lung exam continues to improve.  - Started empiric levaquin at admission. Has multiple allergies including PCN and doxycycline, will complete 5 days. - Continue dulera for symbicort, spiriva, and albuterol scheduled and prn. Has nebulizer, will give refills to make sure she doesn't run out.  Hypokalemia: Resolved with replacement.   T2DM: Last HbA1c 7.8% - Restart home medications  Hypertension: Chronic, stable - Continue metoprolol, HCTZ - Heart healthy/carb-mod diet  Hyperlipidemia: Chronic, stable.  - Continue lipitor  Hypothyroidism: Last TSH was 0.094, remains suppressed at 0.080. T3 pending. Pt reported intermittently missing doses. - Free T4 is within range at 0.95, so will not make change to cytomel. Recommend PCP follow up.  Anxiety/depression: Severe. Has been refractory to many treatments per psychiatry as outpatient.  - Continue scheduled xanax - Continue brexipiprazole, nortriptyline, vortioxetine - Pt filled nefazodone 08/04/2016 (30d supply), so this was not continued. - Strongly recommend psychotherapy/counseling in addition to psychiatry   Mechanical fall at home with right hip pain: Neg XR's - PT eval: Recommends 24hr supervision which the granddaughter will provide and HH-PT which will be arranged.   Constipation: Resolved with fleet enema in ED.  - Monitor, started daily bowel regimen  Discharge Instructions Discharge Instructions    Diet - low sodium heart healthy   Complete by:  As directed    Diet Carb Modified   Complete by:  As directed    Discharge instructions   Complete by:  As directed    You were admitted for a COPD exacerbation and have improved, though you will require oxygen at home.  - Continue using symbicort and spiriva as directed,  scheduled. You can also use albuterol as needed for shortness of breath and/or wheezing. - Prescriptions for refills are provided at discharge for you to make sure you  don't run out.  - Follow up with your primary doctor and pulmonologist in the next few weeks.  - Take prednisone with a decreasing dose for the next 10 days, and complete the course of levaquin in the next 2 days.  - Follow up with psychiatry, and seriously consider adding counseling therapy to your treatment plan.  - Seek medical attention if you start feeling worse.   Increase activity slowly   Complete by:  As directed      Allergies as of 01/27/2017      Reactions   Contrast Media [iodinated Diagnostic Agents] Nausea And Vomiting   Doxycycline Nausea And Vomiting   Erythromycin Nausea And Vomiting   Penicillins Itching, Rash   Has patient had a PCN reaction causing immediate rash, facial/tongue/throat swelling, SOB or lightheadedness with hypotension: YES Has patient had a PCN reaction causing severe rash involving mucus membranes or skin necrosis: NO Has patient had a PCN reaction that required hospitalization: NO Has patient had a PCN reaction occurring within the last 10 years: Unknown If all of the above answers are "NO", then may proceed with Cephalosporin use.   Metronidazole Hives, Rash      Medication List    STOP taking these medications   fluticasone 50 MCG/ACT nasal spray Commonly known as:  FLONASE   loratadine 10 MG tablet Commonly known as:  CLARITIN   metoprolol tartrate 25 MG tablet Commonly known as:  LOPRESSOR   nefazodone 100 MG tablet Commonly known as:  SERZONE     TAKE these medications   albuterol 108 (90 Base) MCG/ACT inhaler Commonly known as:  PROVENTIL HFA;VENTOLIN HFA Inhale 2 puffs into the lungs 2 (two) times daily as needed for wheezing.   albuterol (2.5 MG/3ML) 0.083% nebulizer solution Commonly known as:  PROVENTIL Take 3 mLs (2.5 mg total) by nebulization every 4 (four) hours as needed for wheezing or shortness of breath.   alprazolam 2 MG tablet Commonly known as:  XANAX Take 0.5 tablets (1 mg total) by mouth at bedtime as  needed for sleep or anxiety (Can take up to 3 thimes daily prn). Patient takes 1/2 tablet by mouth four times a day What changed:    how much to take  when to take this  additional instructions   atorvastatin 20 MG tablet Commonly known as:  LIPITOR TK 1 T PO D ATN   budesonide-formoterol 160-4.5 MCG/ACT inhaler Commonly known as:  SYMBICORT Inhale 2 puffs into the lungs 2 (two) times daily.   dextromethorphan-guaiFENesin 30-600 MG 12hr tablet Commonly known as:  MUCINEX DM Take 2 tablets by mouth 2 (two) times daily. Take for 4 days then stop.   FLUTTER Devi Use as directed   glipiZIDE 10 MG tablet Commonly known as:  GLUCOTROL Take 10 mg by mouth 2 (two) times daily.   hydrochlorothiazide 25 MG tablet Commonly known as:  HYDRODIURIL TK 1 T PO D   HYDROcodone-acetaminophen 10-325 MG tablet Commonly known as:  NORCO Take 1 tablet by mouth every 6 (six) hours as needed for moderate pain.   JANUVIA 50 MG tablet Generic drug:  sitaGLIPtin TK 1 T PO D   levofloxacin 500 MG tablet Commonly known as:  LEVAQUIN Take 1 tablet (500 mg total) by mouth at bedtime.   liothyronine 25 MCG tablet Commonly known as:  CYTOMEL Take 25 mcg by mouth every morning.   magnesium oxide 400 (241.3 Mg) MG tablet Commonly known as:  MAG-OX Take 1 tablet (400 mg total) by mouth 2 (two) times daily. For 7 days   metFORMIN 1000 MG tablet Commonly known as:  GLUCOPHAGE TK 1 T PO BID   metoprolol succinate 100 MG 24 hr tablet Commonly known as:  TOPROL-XL TK 1 T PO D   nicotine 21 mg/24hr patch Commonly known as:  NICODERM CQ - dosed in mg/24 hours Place 1 patch (21 mg total) onto the skin daily.   nortriptyline 25 MG capsule Commonly known as:  PAMELOR Take 2 capsules (50 mg total) by mouth at bedtime. What changed:  how much to take   potassium chloride SA 20 MEQ tablet Commonly known as:  K-DUR,KLOR-CON Take 1 tablet (20 mEq total) by mouth daily.   predniSONE 20 MG  tablet Commonly known as:  DELTASONE take 40mg  (2 tabs) po daily for 3 days, then 20mg  (1 tab) po daily for 3 days, then 10mg  (1/2 tab) daily for 4 days   REXULTI 4 MG Tabs Generic drug:  Brexpiprazole Take 4 mg by mouth every morning.   tiotropium 18 MCG inhalation capsule Commonly known as:  SPIRIVA HANDIHALER Place 1 capsule (18 mcg total) into inhaler and inhale daily.   TRINTELLIX 20 MG Tabs Generic drug:  vortioxetine HBr TK 1 T PO QAM   venlafaxine XR 150 MG 24 hr capsule Commonly known as:  EFFEXOR-XR Take 150 mg by mouth every morning.   Vitamin D (Ergocalciferol) 50000 units Caps capsule Commonly known as:  DRISDOL Take 50,000 Units by mouth every 7 (seven) days. On thursday            [place]  (From admission, onward)        Start     Ordered   01/27/17 1200  DME Oxygen  Once    Question Answer Comment  Mode or (Route) Nasal cannula   Liters per Minute 2   Frequency Continuous (stationary and portable oxygen unit needed)   Oxygen delivery system Gas      01/27/17 1159     Follow-up Information    Elwyn Reach, MD. Schedule an appointment as soon as possible for a visit.   Specialty:  Internal Medicine Contact information: [place]. [place] [address] [phone]        Norma Fredrickson, MD. Schedule an appointment as soon as possible for a visit.   Specialty:  Psychiatry Contact information: [place] [address] [phone]        Tanda Rockers, MD Follow up.   Specialty:  Pulmonary Disease Contact information: [address] [place] [address] [phone]          Allergies  Allergen Reactions  . Contrast Media [Iodinated Diagnostic Agents] Nausea And Vomiting  . Doxycycline Nausea And Vomiting  . Erythromycin Nausea And Vomiting  . Penicillins Itching and Rash    Has patient had a PCN reaction causing immediate rash, facial/tongue/throat swelling, SOB or  lightheadedness with hypotension: YES Has patient had a PCN reaction causing severe rash involving mucus membranes or skin necrosis: NO Has patient had a PCN reaction that required hospitalization: NO Has patient had a PCN reaction occurring within the last 10 years: Unknown If all of the above answers are "NO", then may proceed with Cephalosporin use.   . Metronidazole Hives and Rash    Consultations:  None  Procedures/Studies: Dg Chest 2 View  Result Date: 01/24/2017 CLINICAL DATA:  61 year old female with wheezing and congestion for 2 days. Smoker. EXAM: CHEST  2 VIEW COMPARISON:  08/08/2016 and earlier. FINDINGS: Semi upright AP and lateral views of the chest. Mildly lower lung volumes. Stable cardiac size and mediastinal contours. No pneumothorax, pulmonary edema, pleural effusion or confluent pulmonary opacity. Osteopenia. No acute osseous abnormality identified. Cholecystectomy surgical clips. Negative visible bowel gas pattern. IMPRESSION: No acute cardiopulmonary abnormality. Electronically Signed   By: Genevie Ann M.D.   On: 01/24/2017 17:38   Dg Abd 2 Views  Result Date: 01/24/2017 CLINICAL DATA:  Constipation. EXAM: ABDOMEN - 2 VIEW COMPARISON:  Body CT 09/20/2012 FINDINGS: The bowel gas pattern is normal. There is no evidence of free air. Moderate amount of formed stool in the rectum. Spiral shaped metallic body seen overlying the right sacrum on the frontal views. IMPRESSION: Nonobstructive bowel gas pattern. Spiral shaped metallic body seen overlying the right sacrum. Etiology uncertain. Query prior anterior abdominal wall repair anchor. Electronically Signed   By: Fidela Salisbury M.D.   On: 01/24/2017 17:49   Dg Femur Min 2 Views Right  Result Date: 01/24/2017 CLINICAL DATA:  61 year old female status post fall 3 days ago with continued right hip and upper leg pain. EXAM: RIGHT FEMUR 2 VIEWS COMPARISON:  CT Abdomen and Pelvis 09/20/2012. FINDINGS: The right femoral head is  normally located. The right hip joint space appears stable an normal. Calcified right femoral artery atherosclerosis. Visible right hemipelvis intact. Right femoral shaft is intact. Distal right femur is intact. Normal alignment at the right knee. No right knee joint effusion identified. Mid and distal femoral artery calcified atherosclerosis. Chondrocalcinosis at the right knee. Grossly intact proximal tib-fib. IMPRESSION: 1. No acute fracture or dislocation identified about the right femur. 2. Calcified peripheral vascular disease. Electronically Signed   By: Genevie Ann M.D.   On: 01/24/2017 17:40    Subjective: Breathing better, wants to go home because the hospital makes her anxious. No wheezing or chest pain.   Discharge Exam: Vitals:   01/27/17 0414 01/27/17 0955  BP: (!) 152/77   Pulse: 67   Resp: 16   Temp: 98.3 F (36.8 C)   SpO2: 92% (!) 88%   General: Pt is alert, awake, not in acute distress Cardiovascular: RRR, S1/S2 +, no rubs, no gallops Respiratory: Nonlabored with normal rate with supplemental oxygen. Greatly improved air exchange without crackles, wheezing or rhonchi.  Abdominal: Soft, NT, ND, bowel sounds + Extremities: No edema, no cyanosis Skin: Nose has irregular lesion anteriorly without erythema or drainage, for several months per pt.   Labs: BNP (last 3 results) Recent Labs    01/24/17 1455  BNP 10.9   Basic Metabolic Panel: Recent Labs  Lab 01/24/17 1457 01/25/17 0425 01/26/17 0415  NA 137 137 135  K 3.3* 3.5 3.7  CL 98* 101 97*  CO2 28 27 29   GLUCOSE 142* 253* 201*  BUN 5* 7 13  CREATININE 0.65 0.52 0.64  CALCIUM 8.8* 8.7* 9.0   Liver Function Tests: Recent Labs  Lab 01/24/17 1457 01/25/17 0425  AST 24 19  ALT 17 16  ALKPHOS 73 62  BILITOT 0.9 0.8  PROT 7.2 7.2  ALBUMIN 3.5 3.4*   No results for input(s): LIPASE, AMYLASE in the last 168 hours. No results for input(s): AMMONIA in the last 168 hours. CBC: Recent Labs  Lab  01/24/17 1457 01/25/17 0425  WBC 9.1 7.8  NEUTROABS 5.9  --   HGB 14.7 13.8  HCT 44.0 41.9  MCV 90.2 91.1  PLT 247 227   Cardiac Enzymes: No results for input(s): CKTOTAL, CKMB, CKMBINDEX, TROPONINI in the last 168 hours. BNP: Invalid input(s): POCBNP CBG: Recent Labs  Lab 01/26/17 1118 01/26/17 1715 01/26/17 2125 01/27/17 0803 01/27/17 1130  GLUCAP 321* 148* 148* 160* 249*   D-Dimer No results for input(s): DDIMER in the last 72 hours. Hgb A1c No results for input(s): HGBA1C in the last 72 hours. Lipid Profile No results for input(s): CHOL, HDL, LDLCALC, TRIG, CHOLHDL, LDLDIRECT in the last 72 hours. Thyroid function studies Recent Labs    01/26/17 1246  TSH 0.080*   Anemia work up No results for input(s): VITAMINB12, FOLATE, FERRITIN, TIBC, IRON, RETICCTPCT in the last 72 hours. Urinalysis    Component Value Date/Time   COLORURINE YELLOW 01/24/2017 1457   APPEARANCEUR CLEAR 01/24/2017 1457   LABSPEC 1.005 01/24/2017 1457   PHURINE 7.0 01/24/2017 1457   GLUCOSEU NEGATIVE 01/24/2017 1457   HGBUR NEGATIVE 01/24/2017 [place] 01/24/2017 1457   KETONESUR NEGATIVE 01/24/2017 1457   PROTEINUR NEGATIVE 01/24/2017 1457   UROBILINOGEN 0.2 01/03/2014 1819   NITRITE NEGATIVE 01/24/2017 [place] 01/24/2017 1457    Microbiology Recent Results (from the past 240 hour(s))  Culture, blood (Routine x 2)     Status: None (Preliminary result)   Collection Time: 01/24/17  2:57 PM  Result Value Ref Range Status   Specimen Description BLOOD LEFT ARM  Final   Special Requests   Final    BOTTLES DRAWN AEROBIC AND ANAEROBIC Blood Culture adequate volume   Culture   Final    NO GROWTH 2 DAYS Performed at [facility], [place] [address], [place], [place] [address]    Report Status PENDING  Incomplete  Urine culture     Status: None   Collection Time: 01/24/17  2:57 PM  Result Value Ref Range Status   Specimen Description URINE,  CATHETERIZED  Final   Special Requests NONE  Final   Culture   Final    NO GROWTH Performed at [facility], [address] [place], [place] [address]    Report Status 01/26/2017 FINAL  Final  Culture, blood (Routine x 2)     Status: None (Preliminary result)   Collection Time: 01/24/17  3:20 PM  Result Value Ref Range Status   Specimen Description BLOOD RIGHT HAND  Final   Special Requests IN PEDIATRIC BOTTLE Blood Culture adequate volume  Final   Culture   Final    NO GROWTH 2 DAYS Performed at [facility], [place] [address], [place], [place] [address]    Report Status PENDING  Incomplete    Time coordinating discharge: Approximately 40 minutes  Vance Gather, MD  [facility] 01/27/2017, 12:03 PM Pager [phone]

## 2017-01-27 NOTE — Care Management Note (Addendum)
  Per [facility], pt received a power wheelchair in Dec 2018. Insurance will not pay for another wheelchair.  Jonnie Finner RN CCM Case Mgmt phone [phone]

## 2017-01-28 LAB — BLOOD CULTURE ID PANEL (REFLEXED)
ACINETOBACTER BAUMANNII: NOT DETECTED
CANDIDA ALBICANS: NOT DETECTED
CANDIDA GLABRATA: NOT DETECTED
CANDIDA TROPICALIS: NOT DETECTED
Candida krusei: NOT DETECTED
Candida parapsilosis: NOT DETECTED
ENTEROBACTER CLOACAE COMPLEX: NOT DETECTED
ENTEROBACTERIACEAE SPECIES: NOT DETECTED
Enterococcus species: NOT DETECTED
Escherichia coli: NOT DETECTED
Haemophilus influenzae: NOT DETECTED
KLEBSIELLA PNEUMONIAE: NOT DETECTED
Klebsiella oxytoca: NOT DETECTED
Listeria monocytogenes: NOT DETECTED
NEISSERIA MENINGITIDIS: NOT DETECTED
PSEUDOMONAS AERUGINOSA: NOT DETECTED
Proteus species: NOT DETECTED
STREPTOCOCCUS AGALACTIAE: NOT DETECTED
STREPTOCOCCUS PNEUMONIAE: NOT DETECTED
Serratia marcescens: NOT DETECTED
Staphylococcus aureus (BCID): NOT DETECTED
Staphylococcus species: NOT DETECTED
Streptococcus pyogenes: NOT DETECTED
Streptococcus species: NOT DETECTED

## 2017-01-28 NOTE — Progress Notes (Signed)
Post discharge note:   Called by microbiology lab for a positive finding of GPRs at 4 days in the aerobic bottle only with no rapid ID yet identified. Pt was treated and discharged on levaquin. Since pt remained afebrile without leukocytosis, doubt this was an untreated true bacteremia, more likely a contaminant from cutaneous colonization.   Vance Gather, MD 01/28/2017 4:09 PM

## 2017-01-29 LAB — T3, FREE: T3, Free: 2.9 pg/mL (ref 2.0–4.4)

## 2017-01-29 LAB — CULTURE, BLOOD (ROUTINE X 2)
CULTURE: NO GROWTH
Special Requests: ADEQUATE

## 2017-01-30 LAB — CULTURE, BLOOD (ROUTINE X 2): Special Requests: ADEQUATE

## 2017-03-04 ENCOUNTER — Other Ambulatory Visit: Payer: Self-pay

## 2017-03-04 ENCOUNTER — Encounter (HOSPITAL_COMMUNITY): Payer: Self-pay

## 2017-03-04 ENCOUNTER — Emergency Department (HOSPITAL_BASED_OUTPATIENT_CLINIC_OR_DEPARTMENT_OTHER): Admit: 2017-03-04 | Discharge: 2017-03-04 | Disposition: A | Discharge status: Home / Self Care | Payer: Medicare Other

## 2017-03-04 ENCOUNTER — Emergency Department (HOSPITAL_COMMUNITY)
Admission: EM | Admit: 2017-03-04 | Discharge: 2017-03-04 | Disposition: A | Discharge status: Home / Self Care | Payer: Medicare Other | Attending: Emergency Medicine | Admitting: Emergency Medicine

## 2017-03-04 ENCOUNTER — Emergency Department (HOSPITAL_BASED_OUTPATIENT_CLINIC_OR_DEPARTMENT_OTHER)
Admit: 2017-03-04 | Discharge: 2017-03-04 | Disposition: A | Discharge status: Home / Self Care | Payer: Medicare Other | Attending: Emergency Medicine | Admitting: Emergency Medicine

## 2017-03-04 DIAGNOSIS — Z8541 Personal history of malignant neoplasm of cervix uteri: Secondary | ICD-10-CM | POA: Insufficient documentation

## 2017-03-04 DIAGNOSIS — I739 Peripheral vascular disease, unspecified: Secondary | ICD-10-CM | POA: Diagnosis not present

## 2017-03-04 DIAGNOSIS — I5032 Chronic diastolic (congestive) heart failure: Secondary | ICD-10-CM | POA: Insufficient documentation

## 2017-03-04 DIAGNOSIS — F909 Attention-deficit hyperactivity disorder, unspecified type: Secondary | ICD-10-CM | POA: Insufficient documentation

## 2017-03-04 DIAGNOSIS — Z79899 Other long term (current) drug therapy: Secondary | ICD-10-CM | POA: Diagnosis not present

## 2017-03-04 DIAGNOSIS — J449 Chronic obstructive pulmonary disease, unspecified: Secondary | ICD-10-CM | POA: Insufficient documentation

## 2017-03-04 DIAGNOSIS — Z9049 Acquired absence of other specified parts of digestive tract: Secondary | ICD-10-CM | POA: Diagnosis not present

## 2017-03-04 DIAGNOSIS — Z7984 Long term (current) use of oral hypoglycemic drugs: Secondary | ICD-10-CM | POA: Insufficient documentation

## 2017-03-04 DIAGNOSIS — F419 Anxiety disorder, unspecified: Secondary | ICD-10-CM | POA: Diagnosis not present

## 2017-03-04 DIAGNOSIS — M79609 Pain in unspecified limb: Secondary | ICD-10-CM

## 2017-03-04 DIAGNOSIS — J45909 Unspecified asthma, uncomplicated: Secondary | ICD-10-CM | POA: Diagnosis not present

## 2017-03-04 DIAGNOSIS — F17210 Nicotine dependence, cigarettes, uncomplicated: Secondary | ICD-10-CM | POA: Insufficient documentation

## 2017-03-04 DIAGNOSIS — E119 Type 2 diabetes mellitus without complications: Secondary | ICD-10-CM | POA: Diagnosis not present

## 2017-03-04 DIAGNOSIS — I110 Hypertensive heart disease with heart failure: Secondary | ICD-10-CM | POA: Insufficient documentation

## 2017-03-04 DIAGNOSIS — Z85828 Personal history of other malignant neoplasm of skin: Secondary | ICD-10-CM | POA: Insufficient documentation

## 2017-03-04 DIAGNOSIS — F329 Major depressive disorder, single episode, unspecified: Secondary | ICD-10-CM | POA: Diagnosis not present

## 2017-03-04 DIAGNOSIS — M79652 Pain in left thigh: Secondary | ICD-10-CM | POA: Diagnosis present

## 2017-03-04 NOTE — Discharge Instructions (Signed)
Please take a full 325 mg aspirin every day and call the office of Dr. Trula Slade to arrange follow-up in the office regarding the vascular disease of your left leg which is likely causing her pain.  However if you should develop increasing pain swelling if the leg becomes cold or white or pale or the pain is out of control please return immediately to the emergency department.

## 2017-03-04 NOTE — Progress Notes (Addendum)
*  Preliminary Results* Left lower extremity venous duplex completed. Left lower extremity is negative for deep vein thrombosis. There is no evidence of left Baker's cyst.  ARTERIAL ABI completed:    RIGHT    LEFT    PRESSURE WAVEFORM  PRESSURE WAVEFORM  BRACHIAL 173 Triphasic BRACHIAL 147 Triphasic  DP 143 Triphasic DP 81 Monophasic  AT   AT    PT 139 Triphasic PT 96 Monophasic  PER   PER    GREAT TOE  NA GREAT TOE  NA    RIGHT LEFT  ABI 0.83 0.55   The right ABI is suggestive of mild arterial insufficiency at rest. The left ABI is suggestive of moderate arterial insufficiency at rest.  Preliminary results discussed with Dr. Sabra Heck.  03/04/2017 6:40 PM  Maudry Mayhew, BS, RVT, RDCS, RDMS

## 2017-03-04 NOTE — ED Triage Notes (Signed)
Pt presents for evaluation of L leg pain from hip to foot. Pt reports has been ambulatory at home but is in pain. Pt reports pain was present upon wakening 4 days ago and has not improved. No falls or injury.

## 2017-03-04 NOTE — ED Provider Notes (Signed)
[place] EMERGENCY DEPARTMENT Provider Note   CSN: [id number] Arrival date & time: 03/04/17  1435     History   Chief Complaint Chief Complaint  Patient presents with  . Leg Pain    HPI [name] is a 62 y.o. female.  HPI  62 year old female, she has a known history of chronic obstructive pulmonary disease, hypertension, diabetes as well as a history of some congestive heart failure, mental health including depression and anxiety and is currently taking multiple medications for her illnesses.  She reports that she woke up 4 days ago with pain in the left thigh which she states radiates down to her heel, this is worse when she tries to walk, better at rest but still present.  She has noticed that the left leg hurts almost entirely from the thigh through the foot though she denies any swelling of the leg or injuries to the leg.  She states it does feel a little bit colder on that side.  She denies fevers chills nausea vomiting diarrhea, chest pain shortness of breath abdominal pain and has absolutely no back pain.  She has no numbness or weakness of the leg but complains only of pain.  Past Medical History:  Diagnosis Date  . ADD (attention deficit disorder with hyperactivity)   . Anxiety   . Arthritis    "neck" (06/16/2015)  . Asthma   . Basal cell carcinoma, face   . Cervical cancer ([place])   . Chronic bronchitis ([place])   . 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 COPD (chronic obstructive pulmonary disease) ([place])   . Depression   . GERD (gastroesophageal reflux disease)   . Hypertension   . On home oxygen therapy    "2L at bedtime" (06/16/2015)  . Pneumonia 1980s X 1  . Squamous cell carcinoma, face   . Thyroid cyst   . Type II diabetes mellitus [facility])     Patient Active Problem List   Diagnosis Date Noted  . Chronic diastolic CHF (congestive heart failure) ([place]) 08/08/2016  . Protein-calorie malnutrition, moderate ([place]) 06/16/2015  . Hypokalemia  06/04/2015  . Orthostatic hypotension 06/04/2015  . Syncope 06/04/2015  . Head injury with loss of consciousness ([place]) 06/04/2015  . Dehydration 06/04/2015  . Accelerated hypertension 06/04/2015  . Syncope and collapse 06/04/2015  . Pyuria 06/04/2015  . Cigarette smoker 06/04/2015  . Diabetes mellitus without complication ([place]) 97/03/6376  . HLD (hyperlipidemia) 01/03/2014  . COPD exacerbation ([place]) 01/03/2014  . Depression   . Hypertension   . COPD GOLD II/ still smoking    . Asthma   . ADHD (attention deficit hyperactivity disorder)   . Chest pain 12/15/2013  . GERD (gastroesophageal reflux disease)   . Anxiety   . Bronchitis   . Skin lesion of right arm 10/20/2011  . Post op infection 03/24/2011  . Incisional hernia 01/20/2011  . Wound infection after surgery 09/09/2010    Past Surgical History:  Procedure Laterality Date  . ABDOMINAL HYSTERECTOMY    . ANTERIOR CERVICAL DECOMP/DISCECTOMY FUSION    . BACK SURGERY    . BASAL CELL CARCINOMA EXCISION     "face"  . COLECTOMY    . EXCISIONAL HEMORRHOIDECTOMY    . HERNIA REPAIR  X 3   incisional/ventral hernia repair "in my stomach where the womb is"  . LAPAROSCOPIC CHOLECYSTECTOMY    . OOPHORECTOMY  2006   cervical cancer  . RECTOVAGINAL FISTULA CLOSURE  2012  . SQUAMOUS CELL CARCINOMA EXCISION    .  TUBAL LIGATION    . VULVA SURGERY  2006   Cancer in situ  . WRIST SURGERY Right X 1   "accidentially cut it"  . WRIST SURGERY Left    "don't remember why"    OB History    No data available       Home Medications    Prior to Admission medications   Medication Sig Start Date End Date Taking? Authorizing Provider  albuterol (PROVENTIL HFA;VENTOLIN HFA) 108 (90 BASE) MCG/ACT inhaler Inhale 2 puffs into the lungs 2 (two) times daily as needed for wheezing.    [provider]  albuterol (PROVENTIL) (2.5 MG/3ML) 0.083% nebulizer solution Take 3 mLs (2.5 mg total) by nebulization every 4 (four) hours as needed  for wheezing or shortness of breath. 01/27/17   Patrecia Pour, MD  alprazolam Duanne Moron) 2 MG tablet Take 0.5 tablets (1 mg total) by mouth at bedtime as needed for sleep or anxiety (Can take up to 3 thimes daily prn). Patient takes 1/2 tablet by mouth four times a day Patient taking differently: Take 1-2 mg by mouth 4 (four) times daily - after meals and at bedtime. Patient takes 1/2 tab three times daily and 1 tab at bedtime 01/05/14   Theodis Blaze, MD  atorvastatin (LIPITOR) 20 MG tablet TK 1 T PO D ATN 01/16/17   [provider]  budesonide-formoterol (SYMBICORT) 160-4.5 MCG/ACT inhaler Inhale 2 puffs into the lungs 2 (two) times daily. 01/27/17   Patrecia Pour, MD  dextromethorphan-guaiFENesin (MUCINEX DM) 30-600 MG 12hr tablet Take 2 tablets by mouth 2 (two) times daily. Take for 4 days then stop. Patient not taking: Reported on 08/08/2016 06/19/15   Eugenie Filler, MD  glipiZIDE (GLUCOTROL) 10 MG tablet Take 10 mg by mouth 2 (two) times daily.     [provider]  hydrochlorothiazide (HYDRODIURIL) 25 MG tablet TK 1 T PO D 11/15/16   [provider]  HYDROcodone-acetaminophen (NORCO) 10-325 MG tablet Take 1 tablet by mouth every 6 (six) hours as needed for moderate pain.  03/05/15   [provider]  JANUVIA 50 MG tablet TK 1 T PO D 01/16/17   [provider]  levofloxacin (LEVAQUIN) 500 MG tablet Take 1 tablet (500 mg total) by mouth at bedtime. 01/27/17   Patrecia Pour, MD  liothyronine (CYTOMEL) 25 MCG tablet Take 25 mcg by mouth every morning. 05/22/16   [provider]  magnesium oxide (MAG-OX) 400 (241.3 Mg) MG tablet Take 1 tablet (400 mg total) by mouth 2 (two) times daily. For 7 days Patient not taking: Reported on 08/08/2016 06/05/15   Bonnielee Haff, MD  metFORMIN (GLUCOPHAGE) 1000 MG tablet TK 1 T PO BID 11/15/16   [provider]  metoprolol succinate (TOPROL-XL) 100 MG 24 hr tablet TK 1 T PO D 01/16/17   [provider]   nicotine (NICODERM CQ - DOSED IN MG/24 HOURS) 21 mg/24hr patch Place 1 patch (21 mg total) onto the skin daily. Patient not taking: Reported on 01/24/2017 08/10/16   Jani Gravel, MD  nortriptyline (PAMELOR) 25 MG capsule Take 2 capsules (50 mg total) by mouth at bedtime. Patient taking differently: Take 75 mg by mouth at bedtime.  06/19/15   Eugenie Filler, MD  potassium chloride SA (K-DUR,KLOR-CON) 20 MEQ tablet Take 1 tablet (20 mEq total) by mouth daily. 08/09/16   Jani Gravel, MD  predniSONE (DELTASONE) 20 MG tablet take 40mg  (2 tabs) po daily for 3 days, then  20mg  (1 tab) po daily for 3 days, then 10mg  (1/2 tab) daily for 4 days 01/27/17   Patrecia Pour, MD  Respiratory Therapy Supplies (FLUTTER) DEVI Use as directed 10/12/16   Tanda Rockers, MD  REXULTI 4 MG TABS Take 4 mg by mouth every morning. 03/11/15   [provider]  tiotropium (SPIRIVA HANDIHALER) 18 MCG inhalation capsule Place 1 capsule (18 mcg total) into inhaler and inhale daily. 01/27/17   Patrecia Pour, MD  TRINTELLIX 20 MG TABS TK 1 T PO QAM 01/01/17   [provider]  venlafaxine XR (EFFEXOR-XR) 150 MG 24 hr capsule Take 150 mg by mouth every morning. 05/13/15   [provider]  Vitamin D, Ergocalciferol, (DRISDOL) 50000 UNITS CAPS Take 50,000 Units by mouth every 7 (seven) days. On thursday    [provider]    Family History Family History  Problem Relation Age of Onset  . COPD Father   . Cancer Father        lung  . Cancer Paternal Aunt        lung    Social History Social History   Tobacco Use  . Smoking status: Current Every Day Smoker    Packs/day: 1.00    Years: 47.00    Pack years: 47.00    Types: Cigarettes  . Smokeless tobacco: Never Used  Substance Use Topics  . Alcohol use: No  . Drug use: No     Allergies   Contrast media [iodinated diagnostic agents]; Doxycycline; Erythromycin; Penicillins; and Metronidazole   Review of Systems Review of Systems  All  other systems reviewed and are negative.    Physical Exam Updated Vital Signs BP 126/88   Pulse 99   Temp 98.5 F (36.9 C) (Oral)   Resp 17   SpO2 92%   Physical Exam  Constitutional: She appears well-developed and well-nourished. No distress.  HENT:  Head: Normocephalic and atraumatic.  Mouth/Throat: Oropharynx is clear and moist. No oropharyngeal exudate.  Eyes: Conjunctivae and EOM are normal. Pupils are equal, round, and reactive to light. Right eye exhibits no discharge. Left eye exhibits no discharge. No scleral icterus.  Neck: Normal range of motion. Neck supple. No JVD present. No thyromegaly present.  Cardiovascular: Normal rate, regular rhythm, normal heart sounds and intact distal pulses. Exam reveals no gallop and no friction rub.  No murmur heard. Pulmonary/Chest: Effort normal and breath sounds normal. No respiratory distress. She has no wheezes. She has no rales.  Lungs are clear, occasional wheeze but speaks in full sentences without distress  Abdominal: Soft. Bowel sounds are normal. She exhibits no distension and no mass. There is no tenderness.  Musculoskeletal: Normal range of motion. She exhibits no edema or tenderness.  The patient has normal range of motion of all the joints though she has tenderness with manipulation of her left leg.  She reports that there is no reproducible tenderness to palpation but has an underlying constant pain which seems to get worse when she straightens the leg.  I do not have any pulses at the left foot however they are dopplerable with a bedside Doppler machine both at the dorsalis pedis and the posterior tibialis.  Capillary refill is decreased bilaterally  Lymphadenopathy:    She has no cervical adenopathy.  Neurological: She is alert. Coordination normal.  Normal strength and sensation in the left and right leg.  Skin: Skin is warm and dry. No rash noted. No erythema.  Psychiatric: She has a  normal mood and affect. Her behavior  is normal.  Nursing note and vitals reviewed.   ED Treatments / Results  Labs (all labs ordered are listed, but only abnormal results are displayed) Labs Reviewed  CBC  BASIC METABOLIC PANEL     Radiology No results found.  Procedures Procedures (including critical care time)  Medications Ordered in ED Medications - No data to display   Initial Impression / Assessment and Plan / ED Course  I have reviewed the triage vital signs and the nursing notes.  Pertinent labs & imaging results that were available during my care of the patient were reviewed by me and considered in my medical decision making (see chart for details).     There is concerned that the patient may have either vascular obstructive disease such as claudication versus venous obstructive disease though this is less likely given no edema.  Ultrasounds been ordered, labs, will consult with vascular as needed based on studies.  No DVT on ultrasound, ankle-brachial index is 0.55 on the left considered moderate disease, this was discussed with Dr. Trula Slade of the vascular surgery service who is agreeable to follow-up with the patient in the office and agrees with aspirin therapy at this time.  The patient is also agreeable and was instructed on all of her results.  Final Clinical Impressions(s) / ED Diagnoses   Final diagnoses:  Peripheral vascular disease [facility])    ED Discharge Orders    None       Noemi Chapel, MD 03/04/17 1934

## 2017-03-06 ENCOUNTER — Telehealth: Payer: Self-pay | Admitting: Surgery

## 2017-03-06 NOTE — Telephone Encounter (Signed)
Sched appt 03/23/17 at 10:30 w/ [facility]. Pt's ph# is not accepting calls. Left a message on pt's emergency contact, granddaughter's ph# for pt to call office.

## 2017-03-06 NOTE — Telephone Encounter (Signed)
-----   Message from Mena Goes, RN sent at 03/06/2017  9:05 AM EST ----- Regarding: 1-2 weeks new pt with any MD per [facility]    ----- Message ----- From: Serafina Mitchell, MD Sent: 03/05/2017   5:44 PM To: [facility] Charge Pool  Patient came to ER with claudication.  Got ABI's. Needs to be scheduled as new patient for claudication.  Any provider, 1-2 weeks

## 2017-03-23 ENCOUNTER — Ambulatory Visit (INDEPENDENT_AMBULATORY_CARE_PROVIDER_SITE_OTHER): Payer: Medicare Other | Admitting: Vascular Surgery

## 2017-03-23 ENCOUNTER — Other Ambulatory Visit: Payer: Self-pay | Admitting: *Deleted

## 2017-03-23 ENCOUNTER — Encounter: Payer: Self-pay | Admitting: *Deleted

## 2017-03-23 ENCOUNTER — Other Ambulatory Visit: Payer: Self-pay

## 2017-03-23 ENCOUNTER — Encounter: Payer: Self-pay | Admitting: Vascular Surgery

## 2017-03-23 DIAGNOSIS — I70219 Atherosclerosis of native arteries of extremities with intermittent claudication, unspecified extremity: Secondary | ICD-10-CM | POA: Insufficient documentation

## 2017-03-23 NOTE — Progress Notes (Signed)
Requested by:  Elwyn Reach, [address], [place] [id number]  Reason for consultation: left leg pain   History of Present Illness   [name] is a 62 y.o. (02-19-1955) female with severe COPD who presents with chief complaint: left leg pain.  Onset of symptom occurred mid January without obvious trigger.  Pain is described as aching, severity 3-6/10, and associated with short distance ambulation.  Patient has attempted to treat this pain with rest.  The patient has no rest pain symptoms also and no leg wounds/ulcers.  Atherosclerotic risk factors include: DM, HTN, smoker.  Past Medical History:  Diagnosis Date  . ADD (attention deficit disorder with hyperactivity)   . Anxiety   . Arthritis    "neck" (06/16/2015)  . Asthma   . Basal cell carcinoma, face   . Cervical cancer ([place])   . Chronic bronchitis ([place])   . 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 COPD (chronic obstructive pulmonary disease) ([place])   . Depression   . GERD (gastroesophageal reflux disease)   . Hypertension   . On home oxygen therapy    "2L at bedtime" (06/16/2015)  . Pneumonia 1980s X 1  . Squamous cell carcinoma, face   . Thyroid cyst   . Type II diabetes mellitus ([place])     Past Surgical History:  Procedure Laterality Date  . ABDOMINAL HYSTERECTOMY    . ANTERIOR CERVICAL DECOMP/DISCECTOMY FUSION    . BACK SURGERY    . BASAL CELL CARCINOMA EXCISION     "face"  . COLECTOMY    . EXCISIONAL HEMORRHOIDECTOMY    . HERNIA REPAIR  X 3   incisional/ventral hernia repair "in my stomach where the womb is"  . LAPAROSCOPIC CHOLECYSTECTOMY    . OOPHORECTOMY  2006   cervical cancer  . RECTOVAGINAL FISTULA CLOSURE  2012  . SQUAMOUS CELL CARCINOMA EXCISION    . TUBAL LIGATION    . VULVA SURGERY  2006   Cancer in situ  . WRIST SURGERY Right X 1   "accidentially cut it"  . WRIST SURGERY Left    "don't remember why"    Social History   Socioeconomic History  . Marital status: Widowed   Spouse name: Not on file  . Number of children: Not on file  . Years of education: Not on file  . Highest education level: Not on file  Social Needs  . Financial resource strain: Not on file  . Food insecurity - worry: Not on file  . Food insecurity - inability: Not on file  . Transportation needs - medical: Not on file  . Transportation needs - non-medical: Not on file  Occupational History  . Not on file  Tobacco Use  . Smoking status: Current Every Day Smoker    Packs/day: 1.00    Years: 47.00    Pack years: 47.00    Types: Cigarettes  . Smokeless tobacco: Never Used  Substance and Sexual Activity  . Alcohol use: No  . Drug use: No  . Sexual activity: No  Other Topics Concern  . Not on file  Social History Narrative  . Not on file    Family History  Problem Relation Age of Onset  . COPD Father   . Cancer Father        lung  . Cancer Paternal Aunt        lung    Current Outpatient Medications  Medication Sig Dispense Refill  . albuterol (PROVENTIL HFA;VENTOLIN HFA)  108 (90 BASE) MCG/ACT inhaler Inhale 2 puffs into the lungs 2 (two) times daily as needed for wheezing.    Marland Kitchen albuterol (PROVENTIL) (2.5 MG/3ML) 0.083% nebulizer solution Take 3 mLs (2.5 mg total) by nebulization every 4 (four) hours as needed for wheezing or shortness of breath. 75 mL 0  . alprazolam (XANAX) 2 MG tablet Take 0.5 tablets (1 mg total) by mouth at bedtime as needed for sleep or anxiety (Can take up to 3 thimes daily prn). Patient takes 1/2 tablet by mouth four times a day (Patient taking differently: Take 1-2 mg by mouth 4 (four) times daily - after meals and at bedtime. Patient takes 1/2 tab three times daily and 1 tab at bedtime) 30 tablet 0  . atorvastatin (LIPITOR) 20 MG tablet TK 1 T PO D ATN  3  . budesonide-formoterol (SYMBICORT) 160-4.5 MCG/ACT inhaler Inhale 2 puffs into the lungs 2 (two) times daily. 1 Inhaler 0  . dextromethorphan-guaiFENesin (MUCINEX DM) 30-600 MG 12hr tablet Take 2  tablets by mouth 2 (two) times daily. Take for 4 days then stop. (Patient not taking: Reported on 08/08/2016) 16 tablet 0  . glipiZIDE (GLUCOTROL) 10 MG tablet Take 10 mg by mouth 2 (two) times daily.     . hydrochlorothiazide (HYDRODIURIL) 25 MG tablet TK 1 T PO D  3  . HYDROcodone-acetaminophen (NORCO) 10-325 MG tablet Take 1 tablet by mouth every 6 (six) hours as needed for moderate pain.   0  . JANUVIA 50 MG tablet TK 1 T PO D  3  . levofloxacin (LEVAQUIN) 500 MG tablet Take 1 tablet (500 mg total) by mouth at bedtime. 2 tablet 0  . liothyronine (CYTOMEL) 25 MCG tablet Take 25 mcg by mouth every morning.  4  . magnesium oxide (MAG-OX) 400 (241.3 Mg) MG tablet Take 1 tablet (400 mg total) by mouth 2 (two) times daily. For 7 days (Patient not taking: Reported on 08/08/2016) 14 tablet 0  . metFORMIN (GLUCOPHAGE) 1000 MG tablet TK 1 T PO BID  3  . metoprolol succinate (TOPROL-XL) 100 MG 24 hr tablet TK 1 T PO D  2  . nicotine (NICODERM CQ - DOSED IN MG/24 HOURS) 21 mg/24hr patch Place 1 patch (21 mg total) onto the skin daily. (Patient not taking: Reported on 01/24/2017) 28 patch 0  . nortriptyline (PAMELOR) 25 MG capsule Take 2 capsules (50 mg total) by mouth at bedtime. (Patient taking differently: Take 75 mg by mouth at bedtime. )  0  . potassium chloride SA (K-DUR,KLOR-CON) 20 MEQ tablet Take 1 tablet (20 mEq total) by mouth daily. 7 tablet 0  . predniSONE (DELTASONE) 20 MG tablet take 40mg  (2 tabs) po daily for 3 days, then 20mg  (1 tab) po daily for 3 days, then 10mg  (1/2 tab) daily for 4 days 11 tablet 0  . Respiratory Therapy Supplies (FLUTTER) DEVI Use as directed 1 each 0  . REXULTI 4 MG TABS Take 4 mg by mouth every morning.  4  . tiotropium (SPIRIVA HANDIHALER) 18 MCG inhalation capsule Place 1 capsule (18 mcg total) into inhaler and inhale daily. 30 capsule 0  . TRINTELLIX 20 MG TABS TK 1 T PO QAM  5  . venlafaxine XR (EFFEXOR-XR) 150 MG 24 hr capsule Take 150 mg by mouth every morning.   3  . Vitamin D, Ergocalciferol, (DRISDOL) 50000 UNITS CAPS Take 50,000 Units by mouth every 7 (seven) days. On thursday     No current facility-administered medications for this  visit.     Allergies  Allergen Reactions  . Contrast Media [Iodinated Diagnostic Agents] Nausea And Vomiting  . Doxycycline Nausea And Vomiting  . Erythromycin Nausea And Vomiting  . Penicillins Itching and Rash    Has patient had a PCN reaction causing immediate rash, facial/tongue/throat swelling, SOB or lightheadedness with hypotension: YES Has patient had a PCN reaction causing severe rash involving mucus membranes or skin necrosis: NO Has patient had a PCN reaction that required hospitalization: NO Has patient had a PCN reaction occurring within the last 10 years: Unknown If all of the above answers are "NO", then may proceed with Cephalosporin use.   . Metronidazole Hives and Rash    REVIEW OF SYSTEMS (negative unless checked):   Cardiac:  []  Chest pain or chest pressure? [x]  Shortness of breath upon activity? []  Shortness of breath when lying flat? []  Irregular heart rhythm?  Vascular:  [x]  Pain in calf, thigh, or hip brought on by walking? []  Pain in feet at night that wakes you up from your sleep? []  Blood clot in your veins? []  Leg swelling?  Pulmonary:  [x]  Oxygen at home? []  Productive cough? []  Wheezing?  Neurologic:  []  Sudden weakness in arms or legs? []  Sudden numbness in arms or legs? []  Sudden onset of difficult speaking or slurred speech? []  Temporary loss of vision in one eye? []  Problems with dizziness?  Gastrointestinal:  []  Blood in stool? []  Vomited blood?  Genitourinary:  []  Burning when urinating? []  Blood in urine?  Psychiatric:  []  Major depression  Hematologic:  []  Bleeding problems? []  Problems with blood clotting?  Dermatologic:  []  Rashes or ulcers?  Constitutional:  []  Fever or chills?  Ear/Nose/Throat:  []  Change in hearing? []  Nose  bleeds? []  Sore throat?  Musculoskeletal:  []  Back pain? []  Joint pain? []  Muscle pain?   For VQI Use Only   PRE-ADM LIVING Home  AMB STATUS Ambulatory  CAD Sx None  PRIOR CHF None  STRESS TEST No   Physical Examination     Vitals:   03/23/17 1106  BP: 128/87  Pulse: (!) 102  Resp: 16  Temp: 98.1 F (36.7 C)  TempSrc: Oral  SpO2: 95%  Weight: 157 lb (71.2 kg)  Height: 5\' 5"  (1.651 m)   Body mass index is 26.13 kg/m.  General Alert, O x 3, WD, NAD  Head [place]/AT,    Ear/Nose/ Throat Hearing grossly intact, nares without erythema or drainage, oropharynx without Erythema or Exudate, Mallampati score: 3,   Eyes PERRLA, EOMI,    Neck Supple, mid-line trachea,    Pulmonary Sym exp, distant BS, CTA B  Cardiac RRR, Nl S1, S2, no Murmurs, No rubs, No S3,S4  Vascular Vessel Right Left  Radial Palpable Palpable  Brachial Palpable Palpable  Carotid Palpable, No Bruit Palpable, No Bruit  Aorta Not palpable N/A  Femoral Palpable Palpable  Popliteal Not palpable Not palpable  PT Faintly palpable Not palpable  DP Faintly palpable Not palpable    Gastro- intestinal soft, non-distended, non-tender to palpation, No guarding or rebound, no HSM, no masses, no CVAT B, No palpable prominent aortic pulse,    Musculo- skeletal M/S 5/5 throughout  , Extremities without ischemic changes  , No edema present, No visible varicosities , No Lipodermatosclerosis present, B cyanotic feet  Neurologic Cranial nerves 2-12 intact , Pain and light touch intact in extremities , Motor exam as listed above  Psychiatric Judgement intact, Mood & affect appropriate for pt's clinical situation  Dermatologic See M/S exam for extremity exam, No rashes otherwise noted  Lymphatic  Palpable lymph nodes: None    Non-Invasive Vascular imaging   BLE ABI (03/04/17) ABI Findings: +--------+------------------+-----+---------+--------+ Right  Rt Pressure (mmHg)IndexWaveform Comment   +--------+------------------+-----+---------+--------+ [id number]           triphasic     +--------+------------------+-----+---------+--------+ PTA   139        0.80 triphasic     +--------+------------------+-----+---------+--------+ DP   143           triphasic     +--------+------------------+-----+---------+--------+  +--------+------------------+-----+----------+-------+ Left  Lt Pressure (mmHg)IndexWaveform Comment +--------+------------------+-----+----------+-------+ [id number]           triphasic      +--------+------------------+-----+----------+-------+ PTA   96        0.55 monophasic     +--------+------------------+-----+----------+-------+ DP   81           monophasic     +--------+------------------+-----+----------+-------+  +-------+-----------+-----------+------------+------------+ ABI/TBIToday's ABIToday's TBIPrevious ABIPrevious TBI +-------+-----------+-----------+------------+------------+ Right 0.83                       +-------+-----------+-----------+------------+------------+ Left  0.55                       +-------+-----------+-----------+------------+------------+  Final Interpretation: Right: Resting right ankle-brachial index indicates mild right lower extremity arterial disease. Left: Resting left ankle-brachial index indicates moderate left lower extremity arterial disease.  LLE Venous duplex (03/04/17): Right: No evidence of common femoral vein obstruction. Left: There is no evidence of deep vein thrombosis in the lower extremity. No cystic structure found in the popliteal fossa.   Outside Studies/Documentation   3 pages of outside documents were reviewed including: recent ED report.    Medical Decision Making   MALIKAH PRINCIPATO is a 62 y.o. female  who presents with: no evidence of threatened limb, LLE intermittent claudication.   Patient sx are consistent with short distance intermittent claudication.  History, however, is more consistent with an acute event.  I suspect an acute on chronic event.  Based on this patient's history and physical exam, I recommend: Aortogram, bilateral leg runoff, and possible left leg intervention.  I discussed with the patient the natural history of intermittent claudication: 75% of patients have stable or improved symptoms in a year an only 2% require amputation. Eventually 20% may require intervention in a year.  I discussed in depth with the patient the nature of atherosclerosis, and emphasized the importance of maximal medical management including strict control of blood pressure, blood glucose, and lipid levels, antiplatelet agent, obtaining regular exercise, and cessation of smoking.    The patient is aware that without maximal medical management the underlying atherosclerotic disease process will progress, limiting the benefit of any interventions. The patient is currently on a statin: Lipitor.  The patient is currently not on an antiplatelet due to reported COPD exacerbation.  Thank you for allowing [place] to participate in this patient's care.   Adele Barthel, MD, FACS [facility] Office: [phone] Pager: [phone]  03/23/2017, 11:00 AM

## 2017-03-23 NOTE — H&P (View-Only) (Signed)
Requested by:  Elwyn Reach, [place] [address], [place] [address]  Reason for consultation: left leg pain   History of Present Illness   [name] is a 62 y.o. (1955-02-27) female with severe COPD who presents with chief complaint: left leg pain.  Onset of symptom occurred mid January without obvious trigger.  Pain is described as aching, severity 3-6/10, and associated with short distance ambulation.  Patient has attempted to treat this pain with rest.  The patient has no rest pain symptoms also and no leg wounds/ulcers.  Atherosclerotic risk factors include: DM, HTN, smoker.  Past Medical History:  Diagnosis Date  . ADD (attention deficit disorder with hyperactivity)   . Anxiety   . Arthritis    "neck" (06/16/2015)  . Asthma   . Basal cell carcinoma, face   . Cervical cancer ([place])   . Chronic bronchitis ([place])   . 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 COPD (chronic obstructive pulmonary disease) ([place])   . Depression   . GERD (gastroesophageal reflux disease)   . Hypertension   . On home oxygen therapy    "2L at bedtime" (06/16/2015)  . Pneumonia 1980s X 1  . Squamous cell carcinoma, face   . Thyroid cyst   . Type II diabetes mellitus ([place])     Past Surgical History:  Procedure Laterality Date  . ABDOMINAL HYSTERECTOMY    . ANTERIOR CERVICAL DECOMP/DISCECTOMY FUSION    . BACK SURGERY    . BASAL CELL CARCINOMA EXCISION     "face"  . COLECTOMY    . EXCISIONAL HEMORRHOIDECTOMY    . HERNIA REPAIR  X 3   incisional/ventral hernia repair "in my stomach where the womb is"  . LAPAROSCOPIC CHOLECYSTECTOMY    . OOPHORECTOMY  2006   cervical cancer  . RECTOVAGINAL FISTULA CLOSURE  2012  . SQUAMOUS CELL CARCINOMA EXCISION    . TUBAL LIGATION    . VULVA SURGERY  2006   Cancer in situ  . WRIST SURGERY Right X 1   "accidentially cut it"  . WRIST SURGERY Left    "don't remember why"    Social History   Socioeconomic History  . Marital status: Widowed   Spouse name: Not on file  . Number of children: Not on file  . Years of education: Not on file  . Highest education level: Not on file  Social Needs  . Financial resource strain: Not on file  . Food insecurity - worry: Not on file  . Food insecurity - inability: Not on file  . Transportation needs - medical: Not on file  . Transportation needs - non-medical: Not on file  Occupational History  . Not on file  Tobacco Use  . Smoking status: Current Every Day Smoker    Packs/day: 1.00    Years: 47.00    Pack years: 47.00    Types: Cigarettes  . Smokeless tobacco: Never Used  Substance and Sexual Activity  . Alcohol use: No  . Drug use: No  . Sexual activity: No  Other Topics Concern  . Not on file  Social History Narrative  . Not on file    Family History  Problem Relation Age of Onset  . COPD Father   . Cancer Father        lung  . Cancer Paternal Aunt        lung    Current Outpatient Medications  Medication Sig Dispense Refill  . albuterol (PROVENTIL HFA;VENTOLIN HFA)  108 (90 BASE) MCG/ACT inhaler Inhale 2 puffs into the lungs 2 (two) times daily as needed for wheezing.    Marland Kitchen albuterol (PROVENTIL) (2.5 MG/3ML) 0.083% nebulizer solution Take 3 mLs (2.5 mg total) by nebulization every 4 (four) hours as needed for wheezing or shortness of breath. 75 mL 0  . alprazolam (XANAX) 2 MG tablet Take 0.5 tablets (1 mg total) by mouth at bedtime as needed for sleep or anxiety (Can take up to 3 thimes daily prn). Patient takes 1/2 tablet by mouth four times a day (Patient taking differently: Take 1-2 mg by mouth 4 (four) times daily - after meals and at bedtime. Patient takes 1/2 tab three times daily and 1 tab at bedtime) 30 tablet 0  . atorvastatin (LIPITOR) 20 MG tablet TK 1 T PO D ATN  3  . budesonide-formoterol (SYMBICORT) 160-4.5 MCG/ACT inhaler Inhale 2 puffs into the lungs 2 (two) times daily. 1 Inhaler 0  . dextromethorphan-guaiFENesin (MUCINEX DM) 30-600 MG 12hr tablet Take 2  tablets by mouth 2 (two) times daily. Take for 4 days then stop. (Patient not taking: Reported on 08/08/2016) 16 tablet 0  . glipiZIDE (GLUCOTROL) 10 MG tablet Take 10 mg by mouth 2 (two) times daily.     . hydrochlorothiazide (HYDRODIURIL) 25 MG tablet TK 1 T PO D  3  . HYDROcodone-acetaminophen (NORCO) 10-325 MG tablet Take 1 tablet by mouth every 6 (six) hours as needed for moderate pain.   0  . JANUVIA 50 MG tablet TK 1 T PO D  3  . levofloxacin (LEVAQUIN) 500 MG tablet Take 1 tablet (500 mg total) by mouth at bedtime. 2 tablet 0  . liothyronine (CYTOMEL) 25 MCG tablet Take 25 mcg by mouth every morning.  4  . magnesium oxide (MAG-OX) 400 (241.3 Mg) MG tablet Take 1 tablet (400 mg total) by mouth 2 (two) times daily. For 7 days (Patient not taking: Reported on 08/08/2016) 14 tablet 0  . metFORMIN (GLUCOPHAGE) 1000 MG tablet TK 1 T PO BID  3  . metoprolol succinate (TOPROL-XL) 100 MG 24 hr tablet TK 1 T PO D  2  . nicotine (NICODERM CQ - DOSED IN MG/24 HOURS) 21 mg/24hr patch Place 1 patch (21 mg total) onto the skin daily. (Patient not taking: Reported on 01/24/2017) 28 patch 0  . nortriptyline (PAMELOR) 25 MG capsule Take 2 capsules (50 mg total) by mouth at bedtime. (Patient taking differently: Take 75 mg by mouth at bedtime. )  0  . potassium chloride SA (K-DUR,KLOR-CON) 20 MEQ tablet Take 1 tablet (20 mEq total) by mouth daily. 7 tablet 0  . predniSONE (DELTASONE) 20 MG tablet take 40mg  (2 tabs) po daily for 3 days, then 20mg  (1 tab) po daily for 3 days, then 10mg  (1/2 tab) daily for 4 days 11 tablet 0  . Respiratory Therapy Supplies (FLUTTER) DEVI Use as directed 1 each 0  . REXULTI 4 MG TABS Take 4 mg by mouth every morning.  4  . tiotropium (SPIRIVA HANDIHALER) 18 MCG inhalation capsule Place 1 capsule (18 mcg total) into inhaler and inhale daily. 30 capsule 0  . TRINTELLIX 20 MG TABS TK 1 T PO QAM  5  . venlafaxine XR (EFFEXOR-XR) 150 MG 24 hr capsule Take 150 mg by mouth every morning.   3  . Vitamin D, Ergocalciferol, (DRISDOL) 50000 UNITS CAPS Take 50,000 Units by mouth every 7 (seven) days. On thursday     No current facility-administered medications for this  visit.     Allergies  Allergen Reactions  . Contrast Media [Iodinated Diagnostic Agents] Nausea And Vomiting  . Doxycycline Nausea And Vomiting  . Erythromycin Nausea And Vomiting  . Penicillins Itching and Rash    Has patient had a PCN reaction causing immediate rash, facial/tongue/throat swelling, SOB or lightheadedness with hypotension: YES Has patient had a PCN reaction causing severe rash involving mucus membranes or skin necrosis: NO Has patient had a PCN reaction that required hospitalization: NO Has patient had a PCN reaction occurring within the last 10 years: Unknown If all of the above answers are "NO", then may proceed with Cephalosporin use.   . Metronidazole Hives and Rash    REVIEW OF SYSTEMS (negative unless checked):   Cardiac:  []  Chest pain or chest pressure? [x]  Shortness of breath upon activity? []  Shortness of breath when lying flat? []  Irregular heart rhythm?  Vascular:  [x]  Pain in calf, thigh, or hip brought on by walking? []  Pain in feet at night that wakes you up from your sleep? []  Blood clot in your veins? []  Leg swelling?  Pulmonary:  [x]  Oxygen at home? []  Productive cough? []  Wheezing?  Neurologic:  []  Sudden weakness in arms or legs? []  Sudden numbness in arms or legs? []  Sudden onset of difficult speaking or slurred speech? []  Temporary loss of vision in one eye? []  Problems with dizziness?  Gastrointestinal:  []  Blood in stool? []  Vomited blood?  Genitourinary:  []  Burning when urinating? []  Blood in urine?  Psychiatric:  []  Major depression  Hematologic:  []  Bleeding problems? []  Problems with blood clotting?  Dermatologic:  []  Rashes or ulcers?  Constitutional:  []  Fever or chills?  Ear/Nose/Throat:  []  Change in hearing? []  Nose  bleeds? []  Sore throat?  Musculoskeletal:  []  Back pain? []  Joint pain? []  Muscle pain?   For VQI Use Only   PRE-ADM LIVING Home  AMB STATUS Ambulatory  CAD Sx None  PRIOR CHF None  STRESS TEST No   Physical Examination     Vitals:   03/23/17 1106  BP: 128/87  Pulse: (!) 102  Resp: 16  Temp: 98.1 F (36.7 C)  TempSrc: Oral  SpO2: 95%  Weight: 157 lb (71.2 kg)  Height: 5\' 5"  (1.651 m)   Body mass index is 26.13 kg/m.  General Alert, O x 3, WD, NAD  Head [place]/AT,    Ear/Nose/ Throat Hearing grossly intact, nares without erythema or drainage, oropharynx without Erythema or Exudate, Mallampati score: 3,   Eyes PERRLA, EOMI,    Neck Supple, mid-line trachea,    Pulmonary Sym exp, distant BS, CTA B  Cardiac RRR, Nl S1, S2, no Murmurs, No rubs, No S3,S4  Vascular Vessel Right Left  Radial Palpable Palpable  Brachial Palpable Palpable  Carotid Palpable, No Bruit Palpable, No Bruit  Aorta Not palpable N/A  Femoral Palpable Palpable  Popliteal Not palpable Not palpable  PT Faintly palpable Not palpable  DP Faintly palpable Not palpable    Gastro- intestinal soft, non-distended, non-tender to palpation, No guarding or rebound, no HSM, no masses, no CVAT B, No palpable prominent aortic pulse,    Musculo- skeletal M/S 5/5 throughout  , Extremities without ischemic changes  , No edema present, No visible varicosities , No Lipodermatosclerosis present, B cyanotic feet  Neurologic Cranial nerves 2-12 intact , Pain and light touch intact in extremities , Motor exam as listed above  Psychiatric Judgement intact, Mood & affect appropriate for pt's clinical situation  Dermatologic See M/S exam for extremity exam, No rashes otherwise noted  Lymphatic  Palpable lymph nodes: None    Non-Invasive Vascular imaging   BLE ABI (03/04/17) ABI Findings: +--------+------------------+-----+---------+--------+ Right  Rt Pressure (mmHg)IndexWaveform Comment   +--------+------------------+-----+---------+--------+ [id number]           triphasic     +--------+------------------+-----+---------+--------+ PTA   139        0.80 triphasic     +--------+------------------+-----+---------+--------+ DP   143           triphasic     +--------+------------------+-----+---------+--------+  +--------+------------------+-----+----------+-------+ Left  Lt Pressure (mmHg)IndexWaveform Comment +--------+------------------+-----+----------+-------+ [id number]           triphasic      +--------+------------------+-----+----------+-------+ PTA   96        0.55 monophasic     +--------+------------------+-----+----------+-------+ DP   81           monophasic     +--------+------------------+-----+----------+-------+  +-------+-----------+-----------+------------+------------+ ABI/TBIToday's ABIToday's TBIPrevious ABIPrevious TBI +-------+-----------+-----------+------------+------------+ Right 0.83                       +-------+-----------+-----------+------------+------------+ Left  0.55                       +-------+-----------+-----------+------------+------------+  Final Interpretation: Right: Resting right ankle-brachial index indicates mild right lower extremity arterial disease. Left: Resting left ankle-brachial index indicates moderate left lower extremity arterial disease.  LLE Venous duplex (03/04/17): Right: No evidence of common femoral vein obstruction. Left: There is no evidence of deep vein thrombosis in the lower extremity. No cystic structure found in the popliteal fossa.   Outside Studies/Documentation   3 pages of outside documents were reviewed including: recent ED report.    Medical Decision Making   [name] is a 62 y.o. female  who presents with: no evidence of threatened limb, LLE intermittent claudication.   Patient sx are consistent with short distance intermittent claudication.  History, however, is more consistent with an acute event.  I suspect an acute on chronic event.  Based on this patient's history and physical exam, I recommend: Aortogram, bilateral leg runoff, and possible left leg intervention.  I discussed with the patient the natural history of intermittent claudication: 75% of patients have stable or improved symptoms in a year an only 2% require amputation. Eventually 20% may require intervention in a year.  I discussed in depth with the patient the nature of atherosclerosis, and emphasized the importance of maximal medical management including strict control of blood pressure, blood glucose, and lipid levels, antiplatelet agent, obtaining regular exercise, and cessation of smoking.    The patient is aware that without maximal medical management the underlying atherosclerotic disease process will progress, limiting the benefit of any interventions. The patient is currently on a statin: Lipitor.  The patient is currently not on an antiplatelet due to reported COPD exacerbation.  Thank you for allowing [place] to participate in this patient's care.   Adele Barthel, MD, FACS [facility] Office: [phone] Pager: [phone]  03/23/2017, 11:00 AM

## 2017-04-05 ENCOUNTER — Ambulatory Visit (HOSPITAL_COMMUNITY): Payer: Medicare Other | Admitting: Anesthesiology

## 2017-04-05 ENCOUNTER — Encounter (HOSPITAL_COMMUNITY)
Admission: RE | Disposition: A | Discharge status: Home / Self Care | Payer: Self-pay | Source: Ambulatory Visit | Attending: Vascular Surgery

## 2017-04-05 ENCOUNTER — Encounter (HOSPITAL_COMMUNITY): Payer: Self-pay | Admitting: Orthopedic Surgery

## 2017-04-05 ENCOUNTER — Ambulatory Visit (HOSPITAL_COMMUNITY)
Admission: RE | Admit: 2017-04-05 | Discharge: 2017-04-05 | Disposition: A | Discharge status: Home / Self Care | Payer: Medicare Other | Source: Ambulatory Visit | Attending: Vascular Surgery | Admitting: Vascular Surgery

## 2017-04-05 DIAGNOSIS — I70212 Atherosclerosis of native arteries of extremities with intermittent claudication, left leg: Secondary | ICD-10-CM | POA: Diagnosis not present

## 2017-04-05 DIAGNOSIS — F419 Anxiety disorder, unspecified: Secondary | ICD-10-CM | POA: Insufficient documentation

## 2017-04-05 DIAGNOSIS — F909 Attention-deficit hyperactivity disorder, unspecified type: Secondary | ICD-10-CM | POA: Insufficient documentation

## 2017-04-05 DIAGNOSIS — F17210 Nicotine dependence, cigarettes, uncomplicated: Secondary | ICD-10-CM | POA: Insufficient documentation

## 2017-04-05 DIAGNOSIS — Z7951 Long term (current) use of inhaled steroids: Secondary | ICD-10-CM | POA: Insufficient documentation

## 2017-04-05 DIAGNOSIS — K219 Gastro-esophageal reflux disease without esophagitis: Secondary | ICD-10-CM | POA: Diagnosis not present

## 2017-04-05 DIAGNOSIS — F329 Major depressive disorder, single episode, unspecified: Secondary | ICD-10-CM | POA: Insufficient documentation

## 2017-04-05 DIAGNOSIS — I70219 Atherosclerosis of native arteries of extremities with intermittent claudication, unspecified extremity: Secondary | ICD-10-CM | POA: Diagnosis present

## 2017-04-05 DIAGNOSIS — Z9981 Dependence on supplemental oxygen: Secondary | ICD-10-CM | POA: Diagnosis not present

## 2017-04-05 DIAGNOSIS — Z7984 Long term (current) use of oral hypoglycemic drugs: Secondary | ICD-10-CM | POA: Diagnosis not present

## 2017-04-05 DIAGNOSIS — I739 Peripheral vascular disease, unspecified: Secondary | ICD-10-CM

## 2017-04-05 DIAGNOSIS — J449 Chronic obstructive pulmonary disease, unspecified: Secondary | ICD-10-CM | POA: Insufficient documentation

## 2017-04-05 DIAGNOSIS — E1151 Type 2 diabetes mellitus with diabetic peripheral angiopathy without gangrene: Secondary | ICD-10-CM | POA: Insufficient documentation

## 2017-04-05 DIAGNOSIS — Z880 Allergy status to penicillin: Secondary | ICD-10-CM | POA: Insufficient documentation

## 2017-04-05 DIAGNOSIS — I10 Essential (primary) hypertension: Secondary | ICD-10-CM | POA: Insufficient documentation

## 2017-04-05 HISTORY — PX: ABDOMINAL AORTOGRAM: CATH118222

## 2017-04-05 HISTORY — PX: AORTOGRAM: SHX6300

## 2017-04-05 LAB — POCT I-STAT, CHEM 8
BUN: 4 mg/dL — AB (ref 6–20)
CALCIUM ION: 1.22 mmol/L (ref 1.15–1.40)
CREATININE: 0.7 mg/dL (ref 0.44–1.00)
Chloride: 98 mmol/L — ABNORMAL LOW (ref 101–111)
Glucose, Bld: 160 mg/dL — ABNORMAL HIGH (ref 65–99)
HCT: 51 % — ABNORMAL HIGH (ref 36.0–46.0)
Hemoglobin: 17.3 g/dL — ABNORMAL HIGH (ref 12.0–15.0)
Potassium: 3.1 mmol/L — ABNORMAL LOW (ref 3.5–5.1)
SODIUM: 141 mmol/L (ref 135–145)
TCO2: 29 mmol/L (ref 22–32)

## 2017-04-05 LAB — GLUCOSE, CAPILLARY
GLUCOSE-CAPILLARY: 251 mg/dL — AB (ref 65–99)
Glucose-Capillary: 148 mg/dL — ABNORMAL HIGH (ref 65–99)
Glucose-Capillary: 248 mg/dL — ABNORMAL HIGH (ref 65–99)

## 2017-04-05 SURGERY — AORTOGRAM
Anesthesia: General | Site: Groin

## 2017-04-05 SURGERY — INVASIVE LAB ABORTED CASE

## 2017-04-05 MED ORDER — SODIUM CHLORIDE 0.9% FLUSH: 3.0000 mL | Freq: Two times a day (BID) | INTRAVENOUS | Status: DC

## 2017-04-05 MED ORDER — HYDRALAZINE HCL 20 MG/ML IJ SOLN: 5.0000 mg | INTRAMUSCULAR | Status: DC | PRN

## 2017-04-05 MED ORDER — DIPHENHYDRAMINE HCL 50 MG/ML IJ SOLN
INTRAMUSCULAR | Status: DC | PRN
  Administered 2017-04-05: 25 mg via INTRAVENOUS

## 2017-04-05 MED ORDER — LABETALOL HCL 5 MG/ML IV SOLN
INTRAVENOUS | Status: DC | PRN
  Administered 2017-04-05 (×3): 5 mg via INTRAVENOUS

## 2017-04-05 MED ORDER — METFORMIN HCL 500 MG PO TABS
1000.0000 mg | ORAL_TABLET | Freq: Once | ORAL | Status: AC
  Administered 2017-04-05: 1000 mg via ORAL
  Filled 2017-04-05 (×2): qty 2

## 2017-04-05 MED ORDER — ALBUTEROL SULFATE (2.5 MG/3ML) 0.083% IN NEBU
2.5000 mg | INHALATION_SOLUTION | Freq: Once | RESPIRATORY_TRACT | Status: AC
  Administered 2017-04-05: 2.5 mg via RESPIRATORY_TRACT

## 2017-04-05 MED ORDER — SODIUM CHLORIDE 0.9 % IV SOLN: 250.0000 mL | INTRAVENOUS | Status: DC | PRN

## 2017-04-05 MED ORDER — LACTATED RINGERS IV SOLN
INTRAVENOUS | Status: DC
  Administered 2017-04-05: 12:00:00 via INTRAVENOUS

## 2017-04-05 MED ORDER — PROPOFOL 10 MG/ML IV BOLUS
INTRAVENOUS | Status: AC
  Filled 2017-04-05: qty 40

## 2017-04-05 MED ORDER — LIDOCAINE HCL (CARDIAC) 20 MG/ML IV SOLN
INTRAVENOUS | Status: DC | PRN
  Administered 2017-04-05: 30 mg via INTRAVENOUS

## 2017-04-05 MED ORDER — DIPHENHYDRAMINE HCL 50 MG/ML IJ SOLN
25.0000 mg | INTRAMUSCULAR | Status: AC
  Administered 2017-04-05: 25 mg via INTRAVENOUS

## 2017-04-05 MED ORDER — OXYCODONE HCL 5 MG PO TABS
ORAL_TABLET | ORAL | Status: AC
  Administered 2017-04-05: 10 mg via ORAL
  Filled 2017-04-05: qty 2

## 2017-04-05 MED ORDER — SODIUM CHLORIDE 0.9 % IV SOLN
INTRAVENOUS | Status: DC | PRN
  Administered 2017-04-05: 14:00:00 500 mL

## 2017-04-05 MED ORDER — DIPHENHYDRAMINE HCL 50 MG/ML IJ SOLN
INTRAMUSCULAR | Status: AC
  Administered 2017-04-05: 25 mg via INTRAVENOUS
  Filled 2017-04-05: qty 1

## 2017-04-05 MED ORDER — HYDROMORPHONE HCL 1 MG/ML IJ SOLN
0.2500 mg | INTRAMUSCULAR | Status: DC | PRN
  Administered 2017-04-05: 0.25 mg via INTRAVENOUS
  Administered 2017-04-05: 0.5 mg via INTRAVENOUS
  Administered 2017-04-05: 0.25 mg via INTRAVENOUS

## 2017-04-05 MED ORDER — MORPHINE SULFATE (PF) 4 MG/ML IV SOLN: 2.0000 mg | INTRAVENOUS | Status: DC | PRN

## 2017-04-05 MED ORDER — FAMOTIDINE IN NACL 20-0.9 MG/50ML-% IV SOLN
INTRAVENOUS | Status: AC
  Administered 2017-04-05: 20 mg via INTRAVENOUS
  Filled 2017-04-05: qty 50

## 2017-04-05 MED ORDER — FAMOTIDINE IN NACL 20-0.9 MG/50ML-% IV SOLN
20.0000 mg | INTRAVENOUS | Status: AC
  Administered 2017-04-05: 20 mg via INTRAVENOUS

## 2017-04-05 MED ORDER — METHYLPREDNISOLONE SODIUM SUCC 125 MG IJ SOLR
INTRAMUSCULAR | Status: AC
  Administered 2017-04-05: 125 mg via INTRAVENOUS
  Filled 2017-04-05: qty 2

## 2017-04-05 MED ORDER — FENTANYL CITRATE (PF) 250 MCG/5ML IJ SOLN
INTRAMUSCULAR | Status: AC
Start: 2017-04-05 — End: 2017-04-05
  Filled 2017-04-05: qty 5

## 2017-04-05 MED ORDER — DIPHENHYDRAMINE HCL 50 MG/ML IJ SOLN
INTRAMUSCULAR | Status: AC
  Filled 2017-04-05: qty 1

## 2017-04-05 MED ORDER — PROMETHAZINE HCL 25 MG/ML IJ SOLN: 6.2500 mg | INTRAMUSCULAR | Status: DC | PRN

## 2017-04-05 MED ORDER — FENTANYL CITRATE (PF) 100 MCG/2ML IJ SOLN
INTRAMUSCULAR | Status: DC | PRN
  Administered 2017-04-05: 25 ug via INTRAVENOUS
  Administered 2017-04-05: 50 ug via INTRAVENOUS
  Administered 2017-04-05: 25 ug via INTRAVENOUS

## 2017-04-05 MED ORDER — LABETALOL HCL 5 MG/ML IV SOLN: 10.0000 mg | INTRAVENOUS | Status: DC | PRN

## 2017-04-05 MED ORDER — PROPOFOL 10 MG/ML IV BOLUS
INTRAVENOUS | Status: DC | PRN
  Administered 2017-04-05: 200 mg via INTRAVENOUS

## 2017-04-05 MED ORDER — ALBUTEROL SULFATE (2.5 MG/3ML) 0.083% IN NEBU
INHALATION_SOLUTION | RESPIRATORY_TRACT | Status: AC
  Administered 2017-04-05: 2.5 mg via RESPIRATORY_TRACT
  Filled 2017-04-05: qty 3

## 2017-04-05 MED ORDER — LABETALOL HCL 5 MG/ML IV SOLN
INTRAVENOUS | Status: AC
  Filled 2017-04-05: qty 4

## 2017-04-05 MED ORDER — SODIUM CHLORIDE 0.9% FLUSH: 3.0000 mL | INTRAVENOUS | Status: DC | PRN

## 2017-04-05 MED ORDER — LIDOCAINE 2% (20 MG/ML) 5 ML SYRINGE
INTRAMUSCULAR | Status: AC
  Filled 2017-04-05: qty 5

## 2017-04-05 MED ORDER — SODIUM CHLORIDE 0.9 % WEIGHT BASED INFUSION: 1.0000 mL/kg/h | INTRAVENOUS | Status: DC

## 2017-04-05 MED ORDER — GLIPIZIDE 10 MG PO TABS
10.0000 mg | ORAL_TABLET | Freq: Once | ORAL | Status: AC
  Administered 2017-04-05: 10 mg via ORAL
  Filled 2017-04-05 (×2): qty 1

## 2017-04-05 MED ORDER — HYDROMORPHONE HCL 1 MG/ML IJ SOLN
INTRAMUSCULAR | Status: AC
  Filled 2017-04-05: qty 1

## 2017-04-05 MED ORDER — 0.9 % SODIUM CHLORIDE (POUR BTL) OPTIME
TOPICAL | Status: DC | PRN
  Administered 2017-04-05: 1000 mL

## 2017-04-05 MED ORDER — LACTATED RINGERS IV SOLN
INTRAVENOUS | Status: DC | PRN
  Administered 2017-04-05: 14:00:00 via INTRAVENOUS

## 2017-04-05 MED ORDER — OXYCODONE HCL 5 MG PO TABS
5.0000 mg | ORAL_TABLET | ORAL | Status: DC | PRN
  Administered 2017-04-05: 10 mg via ORAL

## 2017-04-05 MED ORDER — METHYLPREDNISOLONE SODIUM SUCC 125 MG IJ SOLR
125.0000 mg | INTRAMUSCULAR | Status: AC
  Administered 2017-04-05: 125 mg via INTRAVENOUS

## 2017-04-05 MED ORDER — SODIUM CHLORIDE 0.9 % IJ SOLN
INTRAMUSCULAR | Status: DC | PRN
  Administered 2017-04-05: 14:00:00 via INTRAMUSCULAR
  Administered 2017-04-05: 56 mL via INTRAMUSCULAR

## 2017-04-05 MED ORDER — SODIUM CHLORIDE 0.9 % IV SOLN
INTRAVENOUS | Status: DC
  Administered 2017-04-05: 10:00:00 via INTRAVENOUS

## 2017-04-05 SURGICAL SUPPLY — 43 items
BAG BANDED W/RUBBER/TAPE 36X54 (MISCELLANEOUS) ×4 IMPLANT
BAG EQP BAND 135X91 W/RBR TAPE (MISCELLANEOUS) ×2
BLADE SURG 11 STRL SS (BLADE) ×4 IMPLANT
BLADE SURG 15 STRL LF DISP TIS (BLADE) ×2 IMPLANT
BLADE SURG 15 STRL SS (BLADE) ×4
CANISTER SUCT 3000ML PPV (MISCELLANEOUS) ×4 IMPLANT
CATH ANGIO 5F BER 65CM (CATHETERS) ×2 IMPLANT
CATH OMNI FLUSH .035X70CM (CATHETERS) ×4 IMPLANT
CATH OMNI FLUSH 5F 65CM (CATHETERS) ×2 IMPLANT
COVER BACK TABLE 80X110 HD (DRAPES) ×8 IMPLANT
COVER DOME SNAP 22 D (MISCELLANEOUS) ×4 IMPLANT
COVER PROBE W GEL 5X96 (DRAPES) ×4 IMPLANT
COVER SURGICAL LIGHT HANDLE (MISCELLANEOUS) ×4 IMPLANT
DEVICE TORQUE KENDALL .025-038 (MISCELLANEOUS) IMPLANT
DRAPE FEMORAL ANGIO 80X135IN (DRAPES) ×4 IMPLANT
DRSG TEGADERM 2-3/8X2-3/4 SM (GAUZE/BANDAGES/DRESSINGS) ×2 IMPLANT
DRSG TEGADERM 4X4.75 (GAUZE/BANDAGES/DRESSINGS) ×2 IMPLANT
ELECT REM PT RETURN 9FT ADLT (ELECTROSURGICAL)
ELECTRODE REM PT RTRN 9FT ADLT (ELECTROSURGICAL) IMPLANT
GAUZE SPONGE 4X4 12PLY STRL LF (GAUZE/BANDAGES/DRESSINGS) ×4 IMPLANT
GAUZE SPONGE 4X4 16PLY XRAY LF (GAUZE/BANDAGES/DRESSINGS) ×6 IMPLANT
GLOVE BIO SURGEON STRL SZ7 (GLOVE) ×4 IMPLANT
GUIDEWIRE ANGLED .035X150CM (WIRE) IMPLANT
KIT BASIN OR (CUSTOM PROCEDURE TRAY) ×4 IMPLANT
KIT ROOM TURNOVER OR (KITS) ×4 IMPLANT
NDL PERC 18GX7CM (NEEDLE) ×2 IMPLANT
NEEDLE PERC 18GX7CM (NEEDLE) ×4 IMPLANT
NS IRRIG 1000ML POUR BTL (IV SOLUTION) ×4 IMPLANT
PACK PERIPHERAL VASCULAR (CUSTOM PROCEDURE TRAY) IMPLANT
PAD ARMBOARD 7.5X6 YLW CONV (MISCELLANEOUS) ×8 IMPLANT
PROTECTION STATION PRESSURIZED (MISCELLANEOUS)
SET MICROPUNCTURE 5F STIFF (MISCELLANEOUS) IMPLANT
SHEATH AVANTI 11CM 5FR (MISCELLANEOUS) IMPLANT
STATION PROTECTION PRESSURIZED (MISCELLANEOUS) ×2 IMPLANT
SYR 10ML LL (SYRINGE) ×12 IMPLANT
SYR 30ML LL (SYRINGE) ×2 IMPLANT
SYR MEDRAD MARK V 150ML (SYRINGE) ×2 IMPLANT
SYRINGE 20CC LL (MISCELLANEOUS) ×6 IMPLANT
TOWEL GREEN STERILE (TOWEL DISPOSABLE) ×8 IMPLANT
TUBING HIGH PRESSURE 120CM (CONNECTOR) ×2 IMPLANT
WATER STERILE IRR 1000ML POUR (IV SOLUTION) IMPLANT
WIRE BENTSON .035X145CM (WIRE) ×4 IMPLANT
WIRE TORQFLEX AUST .018X40CM (WIRE) ×6 IMPLANT

## 2017-04-05 SURGICAL SUPPLY — 8 items
CATH OMNI FLUSH 5F 65CM (CATHETERS) ×3 IMPLANT
COVER PRB 48X5XTLSCP FOLD TPE (BAG) ×1 IMPLANT
COVER PROBE 5X48 (BAG) ×4
KIT PV (KITS) ×4 IMPLANT
SYR MEDRAD MARK V 150ML (SYRINGE) ×4 IMPLANT
TRANSDUCER W/STOPCOCK (MISCELLANEOUS) ×4 IMPLANT
TRAY PV CATH (CUSTOM PROCEDURE TRAY) ×4 IMPLANT
WIRE BENTSON .035X145CM (WIRE) ×3 IMPLANT

## 2017-04-05 NOTE — Progress Notes (Signed)
Pt states she has an open wound on her abdomen from hernia/ colon repair 2 years ago. Non healing wound with pt covering it with a paper towel. Will update Dr Bridgett Larsson.

## 2017-04-05 NOTE — Transfer of Care (Signed)
222Immediate Anesthesia Transfer of Care Note  Patient: [name]  Procedure(s) Performed: Angiogram with left leg runoff (Left Groin) ABDOMINAL AORTOGRAM (N/A )  Patient Location: PACU  Anesthesia Type:General  Level of Consciousness: awake, alert  and oriented  Airway & Oxygen Therapy: Patient Spontanous Breathing and Patient connected to nasal cannula oxygen 2Post-op Assessment: Report given to RN and Post -op Vital signs reviewed and stable  Post vital signs: Reviewed and stable  Last Vitals:  Vitals:   04/05/17 0904 04/05/17 1053  BP: (!) 139/94 (!) 169/89  Pulse: (!) 104   Resp: 20   Temp: 36.7 C   SpO2: 98%     Last Pain:  Vitals:   04/05/17 0933  TempSrc:   PainSc: 8          Complications: No apparent anesthesia complications

## 2017-04-05 NOTE — Interval H&P Note (Signed)
History and Physical Interval Note:  04/05/2017 8:54 AM  [name]  has presented today for surgery, with the diagnosis of pvd with claudication  The various methods of treatment have been discussed with the patient and family. After consideration of risks, benefits and other options for treatment, the patient has consented to  Procedure(s): ABDOMINAL AORTOGRAM W/LOWER EXTREMITY (N/A) as a surgical intervention .  The patient's history has been reviewed, patient examined, no change in status, stable for surgery.  I have reviewed the patient's chart and labs.  Questions were answered to the patient's satisfaction.     Adele Barthel

## 2017-04-05 NOTE — Anesthesia Postprocedure Evaluation (Signed)
Anesthesia Post Note  Patient: [name]  Procedure(s) Performed: Angiogram with left leg runoff (Left Groin) ABDOMINAL AORTOGRAM (N/A )     Patient location during evaluation: PACU Anesthesia Type: General Level of consciousness: awake and alert, patient cooperative and oriented Pain management: pain level controlled (no pain presently) Vital Signs Assessment: post-procedure vital signs reviewed and stable Respiratory status: spontaneous breathing, nonlabored ventilation, respiratory function stable and patient connected to nasal cannula oxygen Cardiovascular status: blood pressure returned to baseline and stable Postop Assessment: no apparent nausea or vomiting Anesthetic complications: no Comments: Pt for discharge to home, will treat glu 251 with her usual oral agents, She does not take insulin, and is not taking full po's yet.    Last Vitals:  Vitals:   04/05/17 1715 04/05/17 1730  BP: (!) 156/91 135/85  Pulse: 100 100  Resp: 14 20  Temp:    SpO2: 92% 93%    Last Pain:  Vitals:   04/05/17 1545  TempSrc:   PainSc: 8                  Saphira Lahmann,E. Milley Vining

## 2017-04-05 NOTE — Anesthesia Procedure Notes (Signed)
Procedure Name: LMA Insertion Date/Time: 04/05/2017 2:15 PM Performed by: Eligha Bridegroom, CRNA Pre-anesthesia Checklist: Patient identified, Emergency Drugs available, Suction available, Patient being monitored and Timeout performed Patient Re-evaluated:Patient Re-evaluated prior to induction Oxygen Delivery Method: Circle system utilized Preoxygenation: Pre-oxygenation with 100% oxygen Induction Type: IV induction Ventilation: Mask ventilation without difficulty LMA: LMA with gastric port inserted LMA Size: 4.0 Number of attempts: 1 Tube secured with: Tape Dental Injury: Teeth and Oropharynx as per pre-operative assessment

## 2017-04-05 NOTE — OR Nursing (Signed)
88mL blood aspirated from 5 French Sheath Left Femoral Artery; 5 French Sheath removed from Left Femoral Artery @1507  and pressure held for 23 minutes by Elenor Legato RN.  Left Dorsalis Pedis pulse dopplered prior to sheath removal and were present at completion of sheath removal prior to transferring to PACU.  Groin site Level 0 prior to and post removal. Vital Signs monitored by anesthesia throughout.  Bedrest starts at 1530.

## 2017-04-05 NOTE — Anesthesia Preprocedure Evaluation (Signed)
Anesthesia Evaluation  Patient identified by MRN, date of birth, ID band Patient awake    Reviewed: Allergy & Precautions, NPO status , Patient's Chart, lab work & pertinent test results  History of Anesthesia Complications Negative for: history of anesthetic complications  Airway Mallampati: I  TM Distance: >3 FB Neck ROM: Full    Dental  (+) Edentulous Upper, Edentulous Lower, Dental Advisory Given   Pulmonary COPD,  COPD inhaler, Current Smoker,    Pulmonary exam normal        Cardiovascular hypertension, + Peripheral Vascular Disease  negative cardio ROS Normal cardiovascular exam     Neuro/Psych PSYCHIATRIC DISORDERS Anxiety Depression negative neurological ROS  negative psych ROS   GI/Hepatic Neg liver ROS, GERD  ,  Endo/Other  negative endocrine ROSdiabetes  Renal/GU negative Renal ROS  negative genitourinary   Musculoskeletal negative musculoskeletal ROS (+)   Abdominal   Peds negative pediatric ROS (+)  Hematology negative hematology ROS (+)   Anesthesia Other Findings   Reproductive/Obstetrics negative OB ROS                             Anesthesia Physical Anesthesia Plan  ASA: III  Anesthesia Plan: General   Post-op Pain Management:    Induction: Intravenous  PONV Risk Score and Plan: 2 and Ondansetron and Dexamethasone  Airway Management Planned: LMA  Additional Equipment:   Intra-op Plan:   Post-operative Plan: Extubation in OR  Informed Consent: I have reviewed the patients History and Physical, chart, labs and discussed the procedure including the risks, benefits and alternatives for the proposed anesthesia with the patient or authorized representative who has indicated his/her understanding and acceptance.   Dental advisory given  Plan Discussed with: Anesthesiologist  Anesthesia Plan Comments:         Anesthesia Quick Evaluation

## 2017-04-05 NOTE — Op Note (Signed)
OPERATIVE NOTE   PROCEDURE: 1.  Bilateral common femoral artery cannulation under ultrasound guidance 2.  Placement of catheter in aorta 3.  Aortogram 4.  Left leg runoff via left femoral sheath  PRE-OPERATIVE DIAGNOSIS: left leg acute on chronic peripheral arterial disease   POST-OPERATIVE DIAGNOSIS: chronic peripheral arterial disease   SURGEON: Adele Barthel, MD  ANESTHESIA: GEN/LMA  ESTIMATED BLOOD LOSS: 50 cc  CONTRAST: 56 cc  FINDING(S):  Aorta: patent  Superior mesenteric artery: not seen Celiac artery: not seen   Right Left  RA patent patent  CIA patent patent  EIA patent patent  IIA patent patent  CFA patent patent  SFA  Patent proximally, occludes in mid-segment  PFA  Patent  Pop  Profunda collaterals reconstitutes above-the-knee popliteal artery   Trif  Patent, underfills suggesting possible distal tibioperoneal trunk stenosis  AT  Occluded  Pero  Patent, distal imaging limited by time out  PT  Patent (dominant runoff), distal imaging limited by time out   SPECIMEN(S):  none  INDICATIONS:   [name] is a 62 y.o. female who presents with acute on chronic peripheral arterial disease symptoms in the left leg.  The patient presents for: aortogram, bilateral leg runoff and possible intervention.  She was scheduled for the Peripheral Vascular angiography suite but she could not lay flat and extend her left leg.  I rescheduled her to the hybrid operating for the same procedure.  I elected to limit the procedure to aortogram and left leg runoff to limit contrast exposure.  I discussed with the patient the nature of angiographic procedures, especially the limited patencies of any endovascular intervention.  The patient is aware of that the risks of an angiographic procedure include but are not limited to: bleeding, infection, access site complications, renal failure, embolization, rupture of vessel, dissection, possible need for emergent surgical intervention,  possible need for surgical procedures to treat the patient's pathology, and stroke and death.  The patient is aware of the risks and agrees to proceed.  DESCRIPTION: After full informed consent was obtained from the patient, the patient was brought back to the hybrid operating room.  The patient was placed supine upon the angiography table and connected to cardiopulmonary monitoring equipment.  General anesthesia was obtained as documented by the Anesthesia notes.  The patient was prepped and drape in the standard fashion for an angiographic procedure.  At this point, attention was turned to the right groin.  In total, I cannulated the right common femoral artery under Sonosite guidance twice with a micropuncture system but every time the microwire would not advance very far in the common femoral artery.  I repeated this also with a 18 gauge needle and Bentson wire twice with the same resistance to wire.  I held pressure to the common femoral artery between each needle cannulation 3 minutes.  There was no obvious hematoma evident after these attempts at access.  I turned my attention to the left groin.  Under ultrasound guidance, the left common femoral artery was then cannulated with a micropuncture needle.  The microwire was advanced into the iliac arterial system.  The needle was exchanged for a microsheath, which was loaded into the common femoral artery over the wire.  The microwire was exchanged for a Bentson wire which was advanced into the aorta.  The microsheath was then exchanged for a 5-Fr sheath which was loaded into the common femoral artery.  The Omniflush catheter was then loaded over the wire up to  the level of L1.  The catheter was connected to the power injector circuit.  After de-airring and de-clotting the circuit, a power injector aortogram was completed.  I replaced the wire into the catheter, straightening out the crook in the catheter.  Both were removed from the sheath  together.  The sheath was aspirated.  No clots were present and the sheath was reloaded with heparinized saline.  I connected the left femoral artery sheath to the power injector circuit.  I completed the left leg angiogram in stations.  The findings are listed above.  In short, the left leg runoff demonstrates: distal superficial femoral artery occlusion with possible left above-the-knee popliteal target.  Her profunda femoral artery collateralization is relatively robust with rapid reconstitution of above-the-knee popliteal artery, suggesting these are NOT acute changes.  Subsequently, I do not see anything that would account for her acute symptoms.  The patient will be referred for cardiac preoperative risk stratification and optimization.  If she turns out to be a poor cardiac risk, would consider antegrade cannulation of left common femoral artery and attempted recannulation of left superficial femoral artery.  The sheath was aspirated.  No clots were present and the sheath was reloaded with heparinized saline.     COMPLICATIONS: none  CONDITION: stable   Adele Barthel, MD, [facility] Office: [phone] Pager: [phone]  04/05/2017, 3:29 PM

## 2017-04-05 NOTE — Progress Notes (Signed)
   Daily Progress Note  Pt unable to hold still on angio table.  Will try to reschedule her into Hybrid OR 16 for GEN/LMA today pending room availability.   Adele Barthel, MD, FACS [facility] [place] Office: [phone] Pager: [phone]  04/05/2017, 10:39 AM

## 2017-04-06 ENCOUNTER — Telehealth: Payer: Self-pay | Admitting: Vascular Surgery

## 2017-04-06 ENCOUNTER — Encounter (HOSPITAL_COMMUNITY): Payer: Self-pay | Admitting: Vascular Surgery

## 2017-04-06 NOTE — Telephone Encounter (Signed)
Sched cardiology appt 04/20/17 at [facility]. Sched [facility] appt 04/25/17 at 9:00. Spoke to pt and pt's husband to give them appt dates, times, and locations.

## 2017-04-06 NOTE — Telephone Encounter (Signed)
-----   Message from Mena Goes, RN sent at 04/06/2017 10:25 AM EST ----- Regarding: cardiac clearance, then appt to discuss surgery   ----- Message ----- From: Conrad [place], MD Sent: 04/05/2017   3:41 PM To: [facility] Charge Pool  [name] [id number] Mar 29, 1955  PROCEDURE: 1.  Bilateral common femoral artery cannulation under ultrasound guidance 2.  Placement of catheter in aorta 3.  Aortogram 4.  Left leg runoff via left femoral sheath  Follow-up: 2-4 weeks (1-2 weeks after cardiac referral)  Additional consults: 1.  Refer patient back to Cardiology for ASAP evaluation for left femoropopliteal bypass

## 2017-04-18 ENCOUNTER — Emergency Department (HOSPITAL_COMMUNITY): Payer: Medicare Other

## 2017-04-18 ENCOUNTER — Inpatient Hospital Stay (HOSPITAL_COMMUNITY)
Admission: EM | Admit: 2017-04-18 | Discharge: 2017-04-21 | DRG: 917 | Disposition: A | Discharge status: Home / Self Care | Payer: Medicare Other | Attending: Internal Medicine | Admitting: Internal Medicine

## 2017-04-18 ENCOUNTER — Encounter (HOSPITAL_COMMUNITY): Payer: Self-pay | Admitting: Emergency Medicine

## 2017-04-18 DIAGNOSIS — J9621 Acute and chronic respiratory failure with hypoxia: Secondary | ICD-10-CM | POA: Diagnosis not present

## 2017-04-18 DIAGNOSIS — E1151 Type 2 diabetes mellitus with diabetic peripheral angiopathy without gangrene: Secondary | ICD-10-CM | POA: Diagnosis present

## 2017-04-18 DIAGNOSIS — J988 Other specified respiratory disorders: Secondary | ICD-10-CM | POA: Diagnosis not present

## 2017-04-18 DIAGNOSIS — Z7982 Long term (current) use of aspirin: Secondary | ICD-10-CM

## 2017-04-18 DIAGNOSIS — Z7984 Long term (current) use of oral hypoglycemic drugs: Secondary | ICD-10-CM

## 2017-04-18 DIAGNOSIS — J449 Chronic obstructive pulmonary disease, unspecified: Secondary | ICD-10-CM | POA: Diagnosis present

## 2017-04-18 DIAGNOSIS — C44311 Basal cell carcinoma of skin of nose: Secondary | ICD-10-CM | POA: Diagnosis present

## 2017-04-18 DIAGNOSIS — Z6823 Body mass index (BMI) 23.0-23.9, adult: Secondary | ICD-10-CM

## 2017-04-18 DIAGNOSIS — J690 Pneumonitis due to inhalation of food and vomit: Secondary | ICD-10-CM | POA: Diagnosis present

## 2017-04-18 DIAGNOSIS — Z8541 Personal history of malignant neoplasm of cervix uteri: Secondary | ICD-10-CM | POA: Diagnosis not present

## 2017-04-18 DIAGNOSIS — T40601A Poisoning by unspecified narcotics, accidental (unintentional), initial encounter: Secondary | ICD-10-CM | POA: Diagnosis present

## 2017-04-18 DIAGNOSIS — E440 Moderate protein-calorie malnutrition: Secondary | ICD-10-CM | POA: Diagnosis present

## 2017-04-18 DIAGNOSIS — F909 Attention-deficit hyperactivity disorder, unspecified type: Secondary | ICD-10-CM | POA: Diagnosis present

## 2017-04-18 DIAGNOSIS — I4891 Unspecified atrial fibrillation: Secondary | ICD-10-CM | POA: Diagnosis present

## 2017-04-18 DIAGNOSIS — R059 Cough, unspecified: Secondary | ICD-10-CM

## 2017-04-18 DIAGNOSIS — J9622 Acute and chronic respiratory failure with hypercapnia: Secondary | ICD-10-CM | POA: Diagnosis not present

## 2017-04-18 DIAGNOSIS — Z9981 Dependence on supplemental oxygen: Secondary | ICD-10-CM | POA: Diagnosis not present

## 2017-04-18 DIAGNOSIS — I340 Nonrheumatic mitral (valve) insufficiency: Secondary | ICD-10-CM | POA: Diagnosis not present

## 2017-04-18 DIAGNOSIS — I5032 Chronic diastolic (congestive) heart failure: Secondary | ICD-10-CM | POA: Diagnosis present

## 2017-04-18 DIAGNOSIS — Z79899 Other long term (current) drug therapy: Secondary | ICD-10-CM

## 2017-04-18 DIAGNOSIS — F17210 Nicotine dependence, cigarettes, uncomplicated: Secondary | ICD-10-CM | POA: Diagnosis present

## 2017-04-18 DIAGNOSIS — F419 Anxiety disorder, unspecified: Secondary | ICD-10-CM | POA: Diagnosis present

## 2017-04-18 DIAGNOSIS — E876 Hypokalemia: Secondary | ICD-10-CM | POA: Diagnosis present

## 2017-04-18 DIAGNOSIS — R1310 Dysphagia, unspecified: Secondary | ICD-10-CM | POA: Diagnosis present

## 2017-04-18 DIAGNOSIS — F329 Major depressive disorder, single episode, unspecified: Secondary | ICD-10-CM | POA: Diagnosis present

## 2017-04-18 DIAGNOSIS — R4182 Altered mental status, unspecified: Secondary | ICD-10-CM | POA: Diagnosis present

## 2017-04-18 DIAGNOSIS — I110 Hypertensive heart disease with heart failure: Secondary | ICD-10-CM | POA: Diagnosis present

## 2017-04-18 DIAGNOSIS — G9340 Encephalopathy, unspecified: Secondary | ICD-10-CM | POA: Diagnosis not present

## 2017-04-18 DIAGNOSIS — T424X1A Poisoning by benzodiazepines, accidental (unintentional), initial encounter: Secondary | ICD-10-CM | POA: Diagnosis present

## 2017-04-18 DIAGNOSIS — G92 Toxic encephalopathy: Secondary | ICD-10-CM | POA: Diagnosis present

## 2017-04-18 DIAGNOSIS — R05 Cough: Secondary | ICD-10-CM | POA: Diagnosis present

## 2017-04-18 DIAGNOSIS — Z981 Arthrodesis status: Secondary | ICD-10-CM | POA: Diagnosis not present

## 2017-04-18 DIAGNOSIS — Z85828 Personal history of other malignant neoplasm of skin: Secondary | ICD-10-CM

## 2017-04-18 DIAGNOSIS — E785 Hyperlipidemia, unspecified: Secondary | ICD-10-CM | POA: Diagnosis present

## 2017-04-18 DIAGNOSIS — R5383 Other fatigue: Secondary | ICD-10-CM

## 2017-04-18 DIAGNOSIS — J9690 Respiratory failure, unspecified, unspecified whether with hypoxia or hypercapnia: Secondary | ICD-10-CM | POA: Insufficient documentation

## 2017-04-18 DIAGNOSIS — Z978 Presence of other specified devices: Secondary | ICD-10-CM

## 2017-04-18 LAB — URINALYSIS, ROUTINE W REFLEX MICROSCOPIC
BILIRUBIN URINE: NEGATIVE
GLUCOSE, UA: NEGATIVE mg/dL
Hgb urine dipstick: NEGATIVE
Ketones, ur: NEGATIVE mg/dL
LEUKOCYTES UA: NEGATIVE
NITRITE: NEGATIVE
PH: 5 (ref 5.0–8.0)
PROTEIN: NEGATIVE mg/dL
Specific Gravity, Urine: 1.024 (ref 1.005–1.030)

## 2017-04-18 LAB — I-STAT VENOUS BLOOD GAS, ED
Acid-Base Excess: 7 mmol/L — ABNORMAL HIGH (ref 0.0–2.0)
Bicarbonate: 33.7 mmol/L — ABNORMAL HIGH (ref 20.0–28.0)
O2 Saturation: 51 %
PO2 VEN: 28 mmHg — AB (ref 32.0–45.0)
TCO2: 35 mmol/L — ABNORMAL HIGH (ref 22–32)
pCO2, Ven: 53.6 mmHg (ref 44.0–60.0)
pH, Ven: 7.406 (ref 7.250–7.430)

## 2017-04-18 LAB — COMPREHENSIVE METABOLIC PANEL
ALT: 11 U/L — ABNORMAL LOW (ref 14–54)
AST: 19 U/L (ref 15–41)
Albumin: 3.7 g/dL (ref 3.5–5.0)
Alkaline Phosphatase: 63 U/L (ref 38–126)
Anion gap: 13 (ref 5–15)
BILIRUBIN TOTAL: 0.4 mg/dL (ref 0.3–1.2)
BUN: 12 mg/dL (ref 6–20)
CHLORIDE: 94 mmol/L — AB (ref 101–111)
CO2: 28 mmol/L (ref 22–32)
CREATININE: 0.9 mg/dL (ref 0.44–1.00)
Calcium: 9.6 mg/dL (ref 8.9–10.3)
GFR calc non Af Amer: >60 mL/min (ref >60)
Glucose, Bld: 147 mg/dL — ABNORMAL HIGH (ref 65–99)
POTASSIUM: 3.2 mmol/L — AB (ref 3.5–5.1)
Sodium: 135 mmol/L (ref 135–145)
TOTAL PROTEIN: 7.1 g/dL (ref 6.5–8.1)

## 2017-04-18 LAB — CBC
HEMATOCRIT: 46.9 % — AB (ref 36.0–46.0)
HEMOGLOBIN: 15.6 g/dL — AB (ref 12.0–15.0)
MCH: 30.5 pg (ref 26.0–34.0)
MCHC: 33.3 g/dL (ref 30.0–36.0)
MCV: 91.8 fL (ref 78.0–100.0)
Platelets: 249 10*3/uL (ref 150–400)
RBC: 5.11 MIL/uL (ref 3.87–5.11)
RDW: 13 % (ref 11.5–15.5)
WBC: 9.5 10*3/uL (ref 4.0–10.5)

## 2017-04-18 LAB — DIFFERENTIAL
BASOS PCT: 0 %
Basophils Absolute: 0 10*3/uL (ref 0.0–0.1)
EOS ABS: 0.1 10*3/uL (ref 0.0–0.7)
EOS PCT: 1 %
LYMPHS ABS: 3.3 10*3/uL (ref 0.7–4.0)
Lymphocytes Relative: 35 %
MONO ABS: 0.3 10*3/uL (ref 0.1–1.0)
MONOS PCT: 3 %
Neutro Abs: 5.9 10*3/uL (ref 1.7–7.7)
Neutrophils Relative %: 61 %

## 2017-04-18 LAB — I-STAT CG4 LACTIC ACID, ED: Lactic Acid, Venous: 1.51 mmol/L (ref 0.5–1.9)

## 2017-04-18 LAB — ETHANOL

## 2017-04-18 LAB — PROTIME-INR
INR: 0.92
Prothrombin Time: 12.3 seconds (ref 11.4–15.2)

## 2017-04-18 LAB — RAPID URINE DRUG SCREEN, HOSP PERFORMED
Amphetamines: NOT DETECTED
BARBITURATES: NOT DETECTED
Benzodiazepines: POSITIVE — AB
COCAINE: NOT DETECTED
Opiates: POSITIVE — AB
TETRAHYDROCANNABINOL: NOT DETECTED

## 2017-04-18 LAB — I-STAT ARTERIAL BLOOD GAS, ED
Acid-Base Excess: 9 mmol/L — ABNORMAL HIGH (ref 0.0–2.0)
BICARBONATE: 36.5 mmol/L — AB (ref 20.0–28.0)
O2 Saturation: 100 %
PCO2 ART: 61.1 mmHg — AB (ref 32.0–48.0)
PO2 ART: 294 mmHg — AB (ref 83.0–108.0)
Patient temperature: 98.7
TCO2: 38 mmol/L — ABNORMAL HIGH (ref 22–32)
pH, Arterial: 7.384 (ref 7.350–7.450)

## 2017-04-18 LAB — APTT: aPTT: 31 seconds (ref 24–36)

## 2017-04-18 LAB — GLUCOSE, CAPILLARY
GLUCOSE-CAPILLARY: 141 mg/dL — AB (ref 65–99)
GLUCOSE-CAPILLARY: 144 mg/dL — AB (ref 65–99)
Glucose-Capillary: 141 mg/dL — ABNORMAL HIGH (ref 65–99)

## 2017-04-18 LAB — CBG MONITORING, ED: GLUCOSE-CAPILLARY: 109 mg/dL — AB (ref 65–99)

## 2017-04-18 LAB — HEMOGLOBIN A1C
Hgb A1c MFr Bld: 6.5 % — ABNORMAL HIGH (ref 4.8–5.6)
Mean Plasma Glucose: 139.85 mg/dL

## 2017-04-18 LAB — I-STAT TROPONIN, ED: Troponin i, poc: 0.01 ng/mL (ref 0.00–0.08)

## 2017-04-18 LAB — SALICYLATE LEVEL

## 2017-04-18 LAB — TRIGLYCERIDES: Triglycerides: 211 mg/dL — ABNORMAL HIGH (ref <150)

## 2017-04-18 LAB — MRSA PCR SCREENING: MRSA BY PCR: NEGATIVE

## 2017-04-18 LAB — ACETAMINOPHEN LEVEL

## 2017-04-18 MED ORDER — POTASSIUM CHLORIDE 20 MEQ/15ML (10%) PO SOLN
40.0000 meq | ORAL | Status: AC
  Administered 2017-04-18 (×2): 40 meq
  Filled 2017-04-18 (×2): qty 30

## 2017-04-18 MED ORDER — FENTANYL CITRATE (PF) 100 MCG/2ML IJ SOLN
100.0000 ug | INTRAMUSCULAR | Status: DC | PRN
Start: 2017-04-18 — End: 2017-04-18

## 2017-04-18 MED ORDER — ASPIRIN 81 MG PO CHEW
81.0000 mg | CHEWABLE_TABLET | Freq: Every day | ORAL | Status: DC
  Administered 2017-04-18: 81 mg
  Filled 2017-04-18: qty 1

## 2017-04-18 MED ORDER — SODIUM BICARBONATE 8.4 % IV SOLN
50.0000 meq | Freq: Once | INTRAVENOUS | Status: AC
  Administered 2017-04-18: 50 meq via INTRAVENOUS
  Filled 2017-04-18: qty 50

## 2017-04-18 MED ORDER — INSULIN ASPART 100 UNIT/ML ~~LOC~~ SOLN
0.0000 [IU] | SUBCUTANEOUS | Status: DC
  Administered 2017-04-18 (×3): 2 [IU] via SUBCUTANEOUS
  Administered 2017-04-19: 5 [IU] via SUBCUTANEOUS
  Administered 2017-04-19: 2 [IU] via SUBCUTANEOUS
  Administered 2017-04-19 (×2): 3 [IU] via SUBCUTANEOUS
  Administered 2017-04-19 – 2017-04-20 (×3): 2 [IU] via SUBCUTANEOUS

## 2017-04-18 MED ORDER — LACTATED RINGERS IV SOLN
INTRAVENOUS | Status: DC
  Administered 2017-04-18 – 2017-04-20 (×3): via INTRAVENOUS

## 2017-04-18 MED ORDER — SODIUM CHLORIDE 0.9 % IV BOLUS (SEPSIS)
1000.0000 mL | Freq: Once | INTRAVENOUS | Status: AC
  Administered 2017-04-18: 1000 mL via INTRAVENOUS

## 2017-04-18 MED ORDER — ETOMIDATE 2 MG/ML IV SOLN
20.0000 mg | Freq: Once | INTRAVENOUS | Status: AC
Start: 2017-04-18 — End: 2017-04-18
  Administered 2017-04-18: 20 mg via INTRAVENOUS

## 2017-04-18 MED ORDER — ENOXAPARIN SODIUM 40 MG/0.4ML ~~LOC~~ SOLN
40.0000 mg | SUBCUTANEOUS | Status: DC
  Administered 2017-04-18 – 2017-04-20 (×3): 40 mg via SUBCUTANEOUS
  Filled 2017-04-18 (×2): qty 0.4

## 2017-04-18 MED ORDER — PROPOFOL 1000 MG/100ML IV EMUL
0.0000 ug/kg/min | INTRAVENOUS | Status: DC
  Administered 2017-04-18: 5 ug/kg/min via INTRAVENOUS
  Filled 2017-04-18: qty 100

## 2017-04-18 MED ORDER — NALOXONE HCL 0.4 MG/ML IJ SOLN
0.4000 mg | Freq: Once | INTRAMUSCULAR | Status: AC
  Administered 2017-04-18: 0.4 mg via INTRAVENOUS
  Filled 2017-04-18: qty 1

## 2017-04-18 MED ORDER — ORAL CARE MOUTH RINSE
15.0000 mL | Freq: Four times a day (QID) | OROMUCOSAL | Status: DC
  Administered 2017-04-18 – 2017-04-19 (×4): 15 mL via OROMUCOSAL

## 2017-04-18 MED ORDER — ASPIRIN 300 MG RE SUPP: 300.0000 mg | RECTAL | Status: DC

## 2017-04-18 MED ORDER — FENTANYL CITRATE (PF) 100 MCG/2ML IJ SOLN
100.0000 ug | INTRAMUSCULAR | Status: DC | PRN
  Administered 2017-04-18 – 2017-04-19 (×3): 100 ug via INTRAVENOUS
  Filled 2017-04-18 (×3): qty 2

## 2017-04-18 MED ORDER — MIDAZOLAM HCL 2 MG/2ML IJ SOLN
2.0000 mg | INTRAMUSCULAR | Status: DC | PRN
  Administered 2017-04-18: 2 mg via INTRAVENOUS
  Filled 2017-04-18: qty 2

## 2017-04-18 MED ORDER — ATORVASTATIN CALCIUM 20 MG PO TABS
20.0000 mg | ORAL_TABLET | Freq: Every day | ORAL | Status: DC
  Administered 2017-04-18: 20 mg
  Filled 2017-04-18: qty 1

## 2017-04-18 MED ORDER — ROCURONIUM BROMIDE 50 MG/5ML IV SOLN
70.0000 mg | Freq: Once | INTRAVENOUS | Status: AC
  Administered 2017-04-18: 70 mg via INTRAVENOUS

## 2017-04-18 MED ORDER — PANTOPRAZOLE SODIUM 40 MG IV SOLR
40.0000 mg | Freq: Every day | INTRAVENOUS | Status: DC
  Administered 2017-04-18 – 2017-04-19 (×2): 40 mg via INTRAVENOUS
  Filled 2017-04-18: qty 40

## 2017-04-18 MED ORDER — SODIUM BICARBONATE 8.4 % IV SOLN
INTRAVENOUS | Status: DC
  Administered 2017-04-18: 15:00:00 via INTRAVENOUS
  Filled 2017-04-18 (×2): qty 150

## 2017-04-18 MED ORDER — FENTANYL CITRATE (PF) 100 MCG/2ML IJ SOLN: 100.0000 ug | INTRAMUSCULAR | Status: DC | PRN

## 2017-04-18 MED ORDER — IPRATROPIUM-ALBUTEROL 0.5-2.5 (3) MG/3ML IN SOLN
3.0000 mL | Freq: Four times a day (QID) | RESPIRATORY_TRACT | Status: DC
  Administered 2017-04-18 – 2017-04-21 (×9): 3 mL via RESPIRATORY_TRACT
  Filled 2017-04-18 (×11): qty 3

## 2017-04-18 MED ORDER — CHLORHEXIDINE GLUCONATE 0.12% ORAL RINSE (MEDLINE KIT)
15.0000 mL | Freq: Two times a day (BID) | OROMUCOSAL | Status: DC
  Administered 2017-04-18 – 2017-04-19 (×2): 15 mL via OROMUCOSAL

## 2017-04-18 MED ORDER — ASPIRIN 81 MG PO CHEW: 324.0000 mg | CHEWABLE_TABLET | ORAL | Status: DC

## 2017-04-18 NOTE — ED Provider Notes (Signed)
[facility] EMERGENCY DEPARTMENT Provider Note  CSN: [id number] Arrival date & time: 04/18/17 1057  Chief Complaint(s) Cerebrovascular Accident  HPI [name] is a 62 y.o. female that he is in the past medical history listed below who presents to the emergency department after being found by family with questionable strokelike symptoms including right facial droop and lethargy.  Last known normal was approximately 2200 last night when they saw her smoking a cigarette.  History difficult to obtain from the patient due to delayed response.  However she is denying any headache, chest pain, shortness of breath, abdominal pain.  She denies any recent emesis or diarrhea.  She denies any urinary symptoms.   EMS reported that patient CBG was within normal limits.  She was  hemodynamically stable in route. HPI  Past Medical History Past Medical History:  Diagnosis Date  . ADD (attention deficit disorder with hyperactivity)   . Anxiety   . Arthritis    "neck" (06/16/2015)  . Asthma   . Basal cell carcinoma, face   . Cervical cancer ([place])   . Chronic bronchitis ([place])   . 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 COPD (chronic obstructive pulmonary disease) ([place])   . Depression   . GERD (gastroesophageal reflux disease)   . Hypertension   . On home oxygen therapy    "2L at bedtime" (06/16/2015)  . Pneumonia 1980s X 1  . Squamous cell carcinoma, face   . Thyroid cyst   . Type II diabetes mellitus [facility])    Patient Active Problem List   Diagnosis Date Noted  . 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 Chronic diastolic CHF (congestive heart failure) ([place]) 08/08/2016  . Protein-calorie malnutrition, moderate ([place]) 06/16/2015  . Hypokalemia 06/04/2015  . Orthostatic hypotension 06/04/2015  . Syncope 06/04/2015  . Head injury with loss of consciousness ([place]) 06/04/2015  . Dehydration 06/04/2015  . Accelerated hypertension  06/04/2015  . Syncope and collapse 06/04/2015  . Pyuria 06/04/2015  . Cigarette smoker 06/04/2015  . Diabetes mellitus without complication ([place]) 40/97/3532  . HLD (hyperlipidemia) 01/03/2014  . COPD exacerbation ([place]) 01/03/2014  . Depression   . Hypertension   . COPD GOLD II/ still smoking    . Asthma   . ADHD (attention deficit hyperactivity disorder)   . Chest pain 12/15/2013  . GERD (gastroesophageal reflux disease)   . Anxiety   . Bronchitis   . Skin lesion of right arm 10/20/2011  . Post op infection 03/24/2011  . Incisional hernia 01/20/2011  . Wound infection after surgery 09/09/2010   Home Medication(s) Prior to Admission medications   Medication Sig Start Date End Date Taking? Authorizing Provider  albuterol (PROVENTIL HFA;VENTOLIN HFA) 108 (90 BASE) MCG/ACT inhaler Inhale 2 puffs into the lungs 2 (two) times daily as needed for wheezing.    [provider]  albuterol (PROVENTIL) (2.5 MG/3ML) 0.083% nebulizer solution Take 3 mLs (2.5 mg total) by nebulization every 4 (four) hours as needed for wheezing or shortness of breath. 01/27/17   Patrecia Pour, MD  alprazolam Duanne [place]) 2 MG tablet Take 0.5 tablets (1 mg total) by mouth at bedtime as needed for sleep or anxiety (Can take up to 3 thimes daily prn). Patient takes 1/2 tablet by mouth four times a day Patient taking differently: Take 1-2 mg by mouth 4 (four) times daily - after meals and at bedtime. Patient takes 1/2 tab three times daily and 1 tab at bedtime 01/05/14  Theodis Blaze, MD  aspirin 81 MG chewable tablet Chew 324 mg by mouth daily.    [provider]  atorvastatin (LIPITOR) 20 MG tablet TK 1 T PO D ATN 01/16/17   [provider]  budesonide-formoterol (SYMBICORT) 160-4.5 MCG/ACT inhaler Inhale 2 puffs into the lungs 2 (two) times daily. 01/27/17   Patrecia Pour, MD  glipiZIDE (GLUCOTROL) 10 MG tablet Take 10 mg by mouth daily.     [provider]  hydrochlorothiazide  (HYDRODIURIL) 25 MG tablet TK 1 T PO D 11/15/16   [provider]  HYDROcodone-acetaminophen (NORCO) 10-325 MG tablet Take 1 tablet by mouth every 6 (six) hours as needed for moderate pain.  03/05/15   [provider]  HYDROGEN PEROXIDE EX Place 1 drop into the right ear daily as needed (ear pain).    [provider]  metFORMIN (GLUCOPHAGE) 1000 MG tablet 1000mg  by mouth once daily 11/15/16   [provider]  metoprolol succinate (TOPROL-XL) 100 MG 24 hr tablet 100mg  by mouth once daily 01/16/17   [provider]  nortriptyline (PAMELOR) 25 MG capsule Take 2 capsules (50 mg total) by mouth at bedtime. Patient taking differently: Take 75 mg by mouth at bedtime.  06/19/15   Eugenie Filler, MD  Respiratory Therapy Supplies (FLUTTER) DEVI Use as directed 10/12/16   Tanda Rockers, MD  REXULTI 4 MG TABS Take 4 mg by mouth every morning. 03/11/15   [provider]  tiotropium (SPIRIVA HANDIHALER) 18 MCG inhalation capsule Place 1 capsule (18 mcg total) into inhaler and inhale daily. 01/27/17   Patrecia Pour, MD  TRINTELLIX 20 MG TABS TAKE 1 TABLET BY MOUTH [place] 01/01/17   [provider]  Vitamin D, Ergocalciferol, (DRISDOL) 50000 UNITS CAPS Take 50,000 Units by mouth every 7 (seven) days. On thursday    [provider]  clomiPRAMINE (ANAFRANIL) 25 MG capsule Take 25 mg by mouth at bedtime.   05/08/11  [provider]                                                                                                                                    Past Surgical History Past Surgical History:  Procedure Laterality Date  . ABDOMINAL AORTOGRAM N/A 04/05/2017   Procedure: ABDOMINAL AORTOGRAM;  Surgeon: Conrad Cheverly, MD;  Location: [facility];  Service: Vascular;  Laterality: N/A;  . ABDOMINAL HYSTERECTOMY    . ANTERIOR CERVICAL DECOMP/DISCECTOMY FUSION    . AORTOGRAM Left 04/05/2017   Procedure: Angiogram with left leg runoff;   Surgeon: Conrad Russell, MD;  Location: [facility];  Service: Vascular;  Laterality: Left;  . BACK SURGERY    . BASAL CELL CARCINOMA EXCISION     "face"  . COLECTOMY    . EXCISIONAL HEMORRHOIDECTOMY    . HERNIA REPAIR  X 3   incisional/ventral hernia repair "in my stomach where the womb  is"  . LAPAROSCOPIC CHOLECYSTECTOMY    . OOPHORECTOMY  2006   cervical cancer  . RECTOVAGINAL FISTULA CLOSURE  2012  . SQUAMOUS CELL CARCINOMA EXCISION    . TUBAL LIGATION    . VULVA SURGERY  2006   Cancer in situ  . WRIST SURGERY Right X 1   "accidentially cut it"  . WRIST SURGERY Left    "don't remember why"   Family History Family History  Problem Relation Age of Onset  . COPD Father   . Cancer Father        lung  . Cancer Paternal Aunt        lung    Social History Social History   Tobacco Use  . Smoking status: Current Every Day Smoker    Packs/day: 1.00    Years: 47.00    Pack years: 47.00    Types: Cigarettes  . Smokeless tobacco: Never Used  Substance Use Topics  . Alcohol use: No  . Drug use: No   Allergies Contrast media [iodinated diagnostic agents]; Doxycycline; Erythromycin; Penicillins; and Metronidazole  Review of Systems Review of Systems All other systems are reviewed and are negative for acute change except as noted in the HPI  Physical Exam Vital Signs  I have reviewed the triage vital signs BP 117/72   Pulse 78   Temp 98.7 F (37.1 C) (Rectal)   Resp 19   SpO2 98%   Physical Exam  Constitutional: She is oriented to person, place, and time. She appears well-developed and well-nourished. She appears lethargic. She has a sickly appearance. No distress.  HENT:  Head: Normocephalic and atraumatic.  Nose: Nose normal.  Mouth/Throat: Mucous membranes are dry.  Eyes: Conjunctivae and EOM are normal. Pupils are equal, round, and reactive to light. Right eye exhibits no discharge. Left eye exhibits no discharge. No scleral icterus.  Neck: Normal range of motion.  Neck supple.  Cardiovascular: Normal rate and regular rhythm. Exam reveals no gallop and no friction rub.  No murmur heard. Pulmonary/Chest: Effort normal and breath sounds normal. No stridor. No respiratory distress. She has no rales.  Abdominal: Soft. She exhibits no distension. There is no tenderness.  Musculoskeletal: She exhibits no edema or tenderness.  Neurological: She is oriented to person, place, and time. She appears lethargic. GCS eye subscore is 3. GCS verbal subscore is 5. GCS motor subscore is 6.  Moves all extremities with equal strength approximately 4 out of 5 throughout. No noticeable facial droop. Slow to respond.  Skin: Skin is warm and dry. No rash noted. She is not diaphoretic. No erythema.  Psychiatric: She has a normal mood and affect.  Vitals reviewed.   ED Results and Treatments Labs (all labs ordered are listed, but only abnormal results are displayed) Labs Reviewed  CBC - Abnormal; Notable for the following components:      Result Value   Hemoglobin 15.6 (*)    HCT 46.9 (*)    All other components within normal limits  COMPREHENSIVE METABOLIC PANEL - Abnormal; Notable for the following components:   Potassium 3.2 (*)    Chloride 94 (*)    Glucose, Bld 147 (*)    ALT 11 (*)    All other components within normal limits  URINALYSIS, ROUTINE W REFLEX MICROSCOPIC - Abnormal; Notable for the following components:   APPearance HAZY (*)    All other components within normal limits  RAPID URINE DRUG SCREEN, HOSP PERFORMED - Abnormal; Notable for the following components:  Opiates POSITIVE (*)    Benzodiazepines POSITIVE (*)    All other components within normal limits  I-STAT VENOUS BLOOD GAS, ED - Abnormal; Notable for the following components:   pO2, Ven 28.0 (*)    Bicarbonate 33.7 (*)    TCO2 35 (*)    Acid-Base Excess 7.0 (*)    All other components within normal limits  CBG MONITORING, ED - Abnormal; Notable for the following components:    Glucose-Capillary 109 (*)    All other components within normal limits  ETHANOL  PROTIME-INR  APTT  DIFFERENTIAL  BLOOD GAS, VENOUS  SALICYLATE LEVEL  ACETAMINOPHEN LEVEL  TRIGLYCERIDES  BLOOD GAS, ARTERIAL  I-STAT CHEM 8, ED  I-STAT TROPONIN, ED  I-STAT CG4 LACTIC ACID, ED                                                                                                                         EKG  EKG Interpretation  Date/Time:  Wednesday April 18 2017 11:09:29 EST Ventricular Rate:  89 PR Interval:    QRS Duration: 107 QT Interval:  367 QTC Calculation: 447 R Axis:   74 Text Interpretation:  Sinus rhythm Multiform ventricular premature complexes Nonspecific T abnormalities, inferior leads, new when compared to prior tracing artifact improved Confirmed by Addison Lank [phone]) on 04/18/2017 11:50:27 AM      Radiology Ct Head Wo Contrast  Result Date: 04/18/2017 CLINICAL DATA:  Stroke-like symptoms.  Right facial droop. EXAM: CT HEAD WITHOUT CONTRAST TECHNIQUE: Contiguous axial images were obtained from the base of the skull through the vertex without intravenous contrast. COMPARISON:  06/04/2015 FINDINGS: Brain: No evidence of acute infarction, hemorrhage, extra-axial collection, ventriculomegaly, or mass effect. Generalized cerebral atrophy. Periventricular white matter low attenuation likely secondary to microangiopathy. Vascular: Cerebrovascular atherosclerotic calcifications are noted. Skull: Negative for fracture or focal lesion. Sinuses/Orbits: Visualized portions of the orbits are unremarkable. Visualized portions of the paranasal sinuses and mastoid air cells are unremarkable. Other: None. IMPRESSION: 1. No acute intracranial pathology. 2. Chronic microvascular disease and cerebral atrophy. Electronically Signed   By: Kathreen Devoid   On: 04/18/2017 12:18   Dg Chest Port 1 View  Result Date: 04/18/2017 CLINICAL DATA:  Stroke-like symptoms. EXAM: PORTABLE CHEST 1 VIEW COMPARISON:   Chest x-ray dated January 24, 2017. FINDINGS: The heart size and mediastinal contours are within normal limits. Normal pulmonary vascularity. No focal consolidation, pleural effusion, or pneumothorax. No acute osseous abnormality. IMPRESSION: No active disease. Electronically Signed   By: Titus Dubin M.D.   On: 04/18/2017 11:47   Pertinent labs & imaging results that were available during my care of the patient were reviewed by me and considered in my medical decision making (see chart for details).  Medications Ordered in ED Medications  fentaNYL (SUBLIMAZE) injection 100 mcg (not administered)  fentaNYL (SUBLIMAZE) injection 100 mcg (not administered)  propofol (DIPRIVAN) 1000 MG/100ML infusion (not administered)  sodium bicarbonate 150 mEq in dextrose 5 % 1,000 mL infusion (  not administered)  sodium chloride 0.9 % bolus 1,000 mL (0 mLs Intravenous Stopped 04/18/17 1251)  sodium bicarbonate injection 50 mEq (50 mEq Intravenous Given 04/18/17 1251)  naloxone (NARCAN) injection 0.4 mg (0.4 mg Intravenous Given 04/18/17 1308)  etomidate (AMIDATE) injection 20 mg (20 mg Intravenous Given 04/18/17 1336)  rocuronium (ZEMURON) injection 70 mg (70 mg Intravenous Given 04/18/17 1337)                                                                                                                                    Procedures Procedure Name: Intubation Date/Time: 04/18/2017 1:54 PM Performed by: Fatima Blank, MD Pre-anesthesia Checklist: Patient identified, Patient being monitored, Emergency Drugs available, Timeout performed and Suction available Oxygen Delivery Method: Non-rebreather mask Preoxygenation: Pre-oxygenation with 100% oxygen Induction Type: Rapid sequence Ventilation: Mask ventilation without difficulty Laryngoscope Size: Glidescope Grade View: Grade I Tube size: 7.5 mm Number of attempts: 1 Airway Equipment and Method: Video-laryngoscopy Placement Confirmation: ETT inserted  through vocal cords under direct vision,  CO2 detector and Breath sounds checked- equal and bilateral Secured at: 20 cm Tube secured with: ETT holder Difficulty Due To: Difficulty was unanticipated       (including critical care time)  CRITICAL CARE Performed by: Grayce Sessions Shazia Mitchener Total critical care time: 40 minutes Critical care time was exclusive of separately billable procedures and treating other patients. Critical care was necessary to treat or prevent imminent or life-threatening deterioration. Critical care was time spent personally by me on the following activities: development of treatment plan with patient and/or surrogate as well as nursing, discussions with consultants, evaluation of patient's response to treatment, examination of patient, obtaining history from patient or surrogate, ordering and performing treatments and interventions, ordering and review of laboratory studies, ordering and review of radiographic studies, pulse oximetry and re-evaluation of patient's condition.   Medical Decision Making / ED Course I have reviewed the nursing notes for this encounter and the patient's prior records (if available in EHR or on provided paperwork).    Initial concern was possible stroke however no obvious facial droop noted on exam.  Patient has diffused generalized weakness but equal bilaterally.  Patient has dry mucous membranes but is afebrile with stable vital signs.  Will obtain infectious workup.  Also considering oral medication given her history of benzodiazepine, narcotic use.  Patient also on nortriptyline.  EKG did have widening of the QRS.  She was given  1 amp of bicarb and placed on bicarb drip.  Narcan was used without change in mental status.  Due to increased gurgling and slow responsiveness, patient was intubated for airway protection, but confirmed by patient and family.   CT head without evidence of ICH.  Labs were grossly reassuring without infectious  source.  No significant electrolyte abnormalities causing her lethargy at this time.  UDS is positive for opiates and benzodiazepines.  Possible overdose/or medication.  May require MRI  to fully rule out stroke.  Will discuss case with  critical care for admission and further workup.  Final Clinical Impression(s) / ED Diagnoses Final diagnoses:  Cough  Lethargy  Endotracheally intubated      This chart was dictated using voice recognition software.  Despite best efforts to proofread,  errors can occur which can change the documentation meaning.   Fatima Blank, MD 04/18/17 1400

## 2017-04-18 NOTE — ED Notes (Addendum)
Instructed to turn off bicarb when CCM at bedside at this time

## 2017-04-18 NOTE — ED Notes (Signed)
Propofol stopped, per CCM

## 2017-04-18 NOTE — ED Notes (Signed)
Attempted report x1. 

## 2017-04-18 NOTE — ED Notes (Signed)
After dose of narcan, no change in level of responsiveness noted.  Patient continues to be somnolent and hard to arouse by voice.  Patient opens eyes momentarily when shaken awake.  No change in pupil reaction time with narcan

## 2017-04-18 NOTE — ED Triage Notes (Signed)
Pt here from home with c/o stroke like symptoms, pt was lsn 2200 last night smoking a cigarette , pt presents with right side facial drop and right side weakness today

## 2017-04-18 NOTE — H&P (Signed)
PULMONARY / CRITICAL CARE MEDICINE   Name: [name] MRN: [id number] DOB: [birth year]    ADMISSION DATE:  04/18/2017  REFERRING MD:  EDP   CHIEF COMPLAINT:  AMS, intubated   HISTORY OF PRESENT ILLNESS:   62yo female smoker with hx COPD, hx cervical cancer, CHF, DM, HTN, PVD with recent aortogram by Dr. Bridgett Larsson 2/21 presented 3/6 after being found altered by family with reported R sided facial droop and R sided weakness although these were not noted once she arrived in ER. In ER she was increasingly somnolent despite narcan, initial CT head negative for acute changes. She was afebrile with essentially normal labs, hemodynamically stable, UDS POS for benzos and opioids.  She was intubated for airway protection in ER and PCCM called for ICU admission.   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  PAST SURGICAL HISTORY: She  has a past surgical history that includes Abdominal hysterectomy; Vulva surgery (2006); Oophorectomy (2006); Rectovaginal fistula closure (2012); Colectomy; Back surgery; Laparoscopic cholecystectomy; Hernia repair (X 3); Excisional hemorrhoidectomy; Anterior cervical decomp/discectomy fusion; Tubal ligation; Wrist surgery (Right, X 1); Wrist surgery (Left); Excision basal cell carcinoma; Squamous cell carcinoma excision; Aortogram (Left, 04/05/2017); and ABDOMINAL AORTOGRAM (N/A, 04/05/2017).  Allergies  Allergen Reactions  . Contrast Media [Iodinated Diagnostic Agents] Nausea And Vomiting  . Doxycycline Nausea And Vomiting  .  Erythromycin Nausea And Vomiting  . Penicillins Itching and Rash    Has patient had a PCN reaction causing immediate rash, facial/tongue/throat swelling, SOB or lightheadedness with hypotension: YES Has patient had a PCN reaction causing severe rash involving mucus membranes or skin necrosis: NO Has patient had a PCN reaction that required hospitalization: NO Has patient had a PCN reaction occurring within the last 10 years: Unknown If all of the above answers are "NO", then may proceed with Cephalosporin use.   . Metronidazole Hives and Rash    No current facility-administered medications on file prior to encounter.    Current Outpatient Medications on File Prior to Encounter  Medication Sig  . albuterol (PROVENTIL HFA;VENTOLIN HFA) 108 (90 BASE) MCG/ACT inhaler Inhale 2 puffs into the lungs 2 (two) times daily as needed for wheezing.  Marland Kitchen albuterol (PROVENTIL) (2.5 MG/3ML) 0.083% nebulizer solution Take 3 mLs (2.5 mg total) by nebulization every 4 (four) hours as needed for wheezing or shortness of breath.  . alprazolam (XANAX) 2 MG tablet Take 0.5 tablets (1 mg total) by mouth at bedtime as needed for sleep or anxiety (Can take up to 3 thimes daily prn). Patient takes 1/2 tablet by mouth four times a day (Patient taking differently: Take 1-2 mg by mouth 4 (four) times daily - after meals and at bedtime. Patient takes 1/2 tab three times daily and 1 tab at bedtime)  . aspirin 81 MG chewable tablet Chew 324 mg by mouth daily.  Marland Kitchen atorvastatin (LIPITOR) 20 MG tablet TK 1 T PO D ATN  . budesonide-formoterol (SYMBICORT) 160-4.5 MCG/ACT inhaler Inhale 2 puffs into the lungs 2 (two) times daily.  Marland Kitchen glipiZIDE (GLUCOTROL) 10 MG tablet Take 10 mg by mouth daily.   . hydrochlorothiazide (HYDRODIURIL) 25 MG tablet TK  1 T PO D  . HYDROcodone-acetaminophen (NORCO) 10-325 MG tablet Take 1 tablet by mouth every 6 (six) hours as needed for moderate pain.   Marland Kitchen HYDROGEN PEROXIDE EX Place 1 drop into the right  ear daily as needed (ear pain).  . metFORMIN (GLUCOPHAGE) 1000 MG tablet 1000mg  by mouth once daily  . metoprolol succinate (TOPROL-XL) 100 MG 24 hr tablet 100mg  by mouth once daily  . nortriptyline (PAMELOR) 25 MG capsule Take 2 capsules (50 mg total) by mouth at bedtime. (Patient taking differently: Take 75 mg by mouth at bedtime. )  . Respiratory Therapy Supplies (FLUTTER) DEVI Use as directed  . REXULTI 4 MG TABS Take 4 mg by mouth every morning.  . tiotropium (SPIRIVA HANDIHALER) 18 MCG inhalation capsule Place 1 capsule (18 mcg total) into inhaler and inhale daily.  . TRINTELLIX 20 MG TABS TAKE 1 TABLET BY MOUTH DALY  . Vitamin D, Ergocalciferol, (DRISDOL) 50000 UNITS CAPS Take 50,000 Units by mouth every 7 (seven) days. On thursday  . [DISCONTINUED] clomiPRAMINE (ANAFRANIL) 25 MG capsule Take 25 mg by mouth at bedtime.     FAMILY HISTORY:  Her indicated that her mother is alive. She indicated that her father is deceased. She indicated that only one of her two brothers is alive. She indicated that the status of her paternal aunt is unknown.   SOCIAL HISTORY: She  reports that she has been smoking cigarettes.  She has a 47.00 pack-year smoking history. she has never used smokeless tobacco. She reports that she does not drink alcohol or use drugs.  REVIEW OF SYSTEMS:   Unable, pt sedated on vent.   SUBJECTIVE:    VITAL SIGNS: BP (!) 146/97   Pulse 81   Temp 98.7 F (37.1 C) (Rectal)   Resp 18   SpO2 100% Comment: Simultaneous filing. User may not have seen previous data.  HEMODYNAMICS:    VENTILATOR SETTINGS: Vent Mode: PRVC FiO2 (%):  [40 %-100 %] 40 % Set Rate:  [18 bmp] 18 bmp Vt Set:  [460 mL] 460 mL PEEP:  [5 cmH20] 5 cmH20 Plateau Pressure:  [13 cmH20] 13 cmH20  INTAKE / OUTPUT: No intake/output data recorded.  PHYSICAL EXAMINATION: General:  Cachectic, chronically ill appearing female, NAD  Neuro:  Sedated on vent post intubation, RASS -3, unresponsive, on  low dose propofol  HEENT:  Mm dry, no JVD, ETT  Cardiovascular:  s1s2 rrr Lungs:  resps even non labored on vent, scattered rhonchi L  Abdomen:  Soft, round, hypoactive bs  Musculoskeletal:  Warm and dry, no sig edema   LABS:  BMET Recent Labs  Lab 04/18/17 1123  NA 135  K 3.2*  CL 94*  CO2 28  BUN 12  CREATININE 0.90  GLUCOSE 147*    Electrolytes Recent Labs  Lab 04/18/17 1123  CALCIUM 9.6    CBC Recent Labs  Lab 04/18/17 1123  WBC 9.5  HGB 15.6*  HCT 46.9*  PLT 249    Coag's Recent Labs  Lab 04/18/17 1123  APTT 31  INR 0.92    Sepsis Markers Recent Labs  Lab 04/18/17 1140  LATICACIDVEN 1.51    ABG Recent Labs  Lab 04/18/17 1448  PHART 7.384  PCO2ART 61.1*  PO2ART 294.0*    Liver Enzymes Recent Labs  Lab 04/18/17 1123  AST 19  ALT 11*  ALKPHOS 63  BILITOT 0.4  ALBUMIN 3.7    Cardiac Enzymes No results for input(s): TROPONINI, PROBNP in the last 168 hours.  Glucose Recent Labs  Lab 04/18/17 1256  GLUCAP 109*    Imaging Ct Head Wo Contrast  Result Date: 04/18/2017 CLINICAL DATA:  Stroke-like symptoms.  Right facial droop. EXAM: CT HEAD WITHOUT CONTRAST TECHNIQUE: Contiguous axial images were obtained from the base of the skull through the vertex without intravenous contrast. COMPARISON:  06/04/2015 FINDINGS: Brain: No evidence of acute infarction, hemorrhage, extra-axial collection, ventriculomegaly, or mass effect. Generalized cerebral atrophy. Periventricular white matter low attenuation likely secondary to microangiopathy. Vascular: Cerebrovascular atherosclerotic calcifications are noted. Skull: Negative for fracture or focal lesion. Sinuses/Orbits: Visualized portions of the orbits are unremarkable. Visualized portions of the paranasal sinuses and mastoid air cells are unremarkable. Other: None. IMPRESSION: 1. No acute intracranial pathology. 2. Chronic microvascular disease and cerebral atrophy. Electronically Signed   By:  Kathreen Devoid   On: 04/18/2017 12:18   Dg Chest Port 1 View  Result Date: 04/18/2017 CLINICAL DATA:  Stroke-like symptoms. EXAM: PORTABLE CHEST 1 VIEW COMPARISON:  Chest x-ray dated January 24, 2017. FINDINGS: The heart size and mediastinal contours are within normal limits. Normal pulmonary vascularity. No focal consolidation, pleural effusion, or pneumothorax. No acute osseous abnormality. IMPRESSION: No active disease. Electronically Signed   By: Titus Dubin M.D.   On: 04/18/2017 11:47     STUDIES:  CT head 3/6>>> 1. No acute intracranial pathology. 2. Chronic microvascular disease and cerebral atrophy. MRI brain 3/6>>>  CULTURES:   ANTIBIOTICS:   SIGNIFICANT EVENTS:   LINES/TUBES: ETT 3/6>>>  DISCUSSION: 62yo female with multiple medical problems including DM, HTN, COPD on 2L O2 qhs, PVD, anxiety presented 3/6 with AMS requiring intubation for airway protection likely r/t unintentional OD.   ASSESSMENT / PLAN:  NEUROLOGIC AMS -- initially some concern re: facial droop per family but none seen by ER staff.  Head CT neg.  Global weakness and somnolence despite narcan.  Suspect OD of xanax and opiates.   Hx anxiety, depression, ADHD  P:   RASS goal: -1 MRI  Hold home psych meds  PRN fentanyl  D/c propofol started in ER    PULMONARY Inability to protect airway  COPD  Tobacco abuse  P:   Vent support - 8cc/kg  F/u CXR  F/u ABG  SBT in am  duonebs q6h  Hold home symbicort, spiriva   CARDIOVASCULAR Hx HTN  P:  Hold home B blocker, hztz  RENAL Hypokalemia  P:   Replete K  F/u chem  D/c HCO3 gtt   GASTROINTESTINAL Protein calorie malnutrition  P:   TF if not extubated in am  PPI   HEMATOLOGIC No active issue  P:  F/u CBC  SQ heparin   INFECTIOUS No active issue  P:   Monitor wbc, fever curve off abx   ENDOCRINE DM   P:   SSI    FAMILY  - Updates:  No family at bedside 3/6  - Inter-disciplinary family meet or Palliative Care  meeting due by:  Day 7     Nickolas Madrid, NP 04/18/2017  3:16 PM Pager: [phone] or [phone]

## 2017-04-19 ENCOUNTER — Inpatient Hospital Stay (HOSPITAL_COMMUNITY): Payer: Medicare Other

## 2017-04-19 LAB — GLUCOSE, CAPILLARY
GLUCOSE-CAPILLARY: 141 mg/dL — AB (ref 65–99)
GLUCOSE-CAPILLARY: 114 mg/dL — AB (ref 65–99)
Glucose-Capillary: 145 mg/dL — ABNORMAL HIGH (ref 65–99)
Glucose-Capillary: 192 mg/dL — ABNORMAL HIGH (ref 65–99)
Glucose-Capillary: 185 mg/dL — ABNORMAL HIGH (ref 65–99)
Glucose-Capillary: 207 mg/dL — ABNORMAL HIGH (ref 65–99)

## 2017-04-19 LAB — BLOOD GAS, ARTERIAL
ACID-BASE EXCESS: 6.4 mmol/L — AB (ref 0.0–2.0)
ACID-BASE EXCESS: 3.6 mmol/L — AB (ref 0.0–2.0)
BICARBONATE: 30.4 mmol/L — AB (ref 20.0–28.0)
BICARBONATE: 27.1 mmol/L (ref 20.0–28.0)
DELIVERY SYSTEMS: POSITIVE
DRAWN BY: 41977
DRAWN BY: 519031
FIO2: 40
FIO2: 50
LHR: 18 {breaths}/min
MECHVT: 460 mL
O2 SAT: 88.5 %
O2 Saturation: 87.7 %
PEEP/CPAP: 5 cmH2O
PEEP/CPAP: 5 cmH2O
PH ART: 7.455 — AB (ref 7.350–7.450)
PH ART: 7.457 — AB (ref 7.350–7.450)
PO2 ART: 58.1 mmHg — AB (ref 83.0–108.0)
Patient temperature: 98.6
Patient temperature: 100.8
Pressure control: 12 cmH2O
RATE: 16 resp/min
pCO2 arterial: 43.9 mmHg (ref 32.0–48.0)
pCO2 arterial: 39.5 mmHg (ref 32.0–48.0)
pO2, Arterial: 51.6 mmHg — ABNORMAL LOW (ref 83.0–108.0)

## 2017-04-19 LAB — CBC
HEMATOCRIT: 43 % (ref 36.0–46.0)
Hemoglobin: 13.5 g/dL (ref 12.0–15.0)
MCH: 29.3 pg (ref 26.0–34.0)
MCHC: 31.4 g/dL (ref 30.0–36.0)
MCV: 93.3 fL (ref 78.0–100.0)
Platelets: 195 10*3/uL (ref 150–400)
RBC: 4.61 MIL/uL (ref 3.87–5.11)
RDW: 13 % (ref 11.5–15.5)
WBC: 12.4 10*3/uL — AB (ref 4.0–10.5)

## 2017-04-19 LAB — PHOSPHORUS: Phosphorus: 2.7 mg/dL (ref 2.5–4.6)

## 2017-04-19 LAB — BASIC METABOLIC PANEL
ANION GAP: 11 (ref 5–15)
BUN: 15 mg/dL (ref 6–20)
CO2: 29 mmol/L (ref 22–32)
Calcium: 9.2 mg/dL (ref 8.9–10.3)
Chloride: 100 mmol/L — ABNORMAL LOW (ref 101–111)
Creatinine, Ser: 0.99 mg/dL (ref 0.44–1.00)
GFR calc Af Amer: >60 mL/min (ref >60)
GFR, EST NON AFRICAN AMERICAN: 59 mL/min — AB (ref >60)
GLUCOSE: 142 mg/dL — AB (ref 65–99)
POTASSIUM: 3.8 mmol/L (ref 3.5–5.1)
Sodium: 140 mmol/L (ref 135–145)

## 2017-04-19 LAB — MAGNESIUM: MAGNESIUM: 1.5 mg/dL — AB (ref 1.7–2.4)

## 2017-04-19 MED ORDER — LABETALOL HCL 5 MG/ML IV SOLN
10.0000 mg | INTRAVENOUS | Status: DC | PRN
  Administered 2017-04-19: 10 mg via INTRAVENOUS
  Filled 2017-04-19: qty 4

## 2017-04-19 NOTE — Progress Notes (Signed)
RT called due to self extubation. When I arrived, RN had placed her on a canula and she seemed to be doing well. SAT 95% on 6LNC. Still seems to be sleepy, but has been verbal. Suctioned back of throat for small tan secretions, RT will continue to monitor

## 2017-04-19 NOTE — Progress Notes (Signed)
Patient placed on bipap per MD orders d/t respiratory distress. Patient's breathing has become more labored and patient looks fatigued. Sats in mid- 80s. Sats on bipap now 92%. Will continue to monitor patient.

## 2017-04-19 NOTE — Progress Notes (Signed)
PULMONARY / CRITICAL CARE MEDICINE   Name: [name] MRN: [id number] DOB: 27-Aug-1955    ADMISSION DATE:  04/18/2017  REFERRING MD:  Dr. Leonette Monarch, ER  CHIEF COMPLAINT:  AMS, intubated   HISTORY OF PRESENT ILLNESS:   62yo female smoker with hx COPD on 3 liters oxygen, hx cervical cancer, CHF, DM, HTN, PVD with recent aortogram by Dr. Bridgett Larsson 2/21 presented 3/6 after being found altered by family with reported R sided facial droop and R sided weakness although these were not noted once she arrived in ER. In ER she was increasingly somnolent despite narcan, initial CT head negative for acute changes. She was afebrile with essentially normal labs, hemodynamically stable, UDS POS for benzos and opioids.  She was intubated for airway protection in ER and PCCM called for ICU admission.   SUBJECTIVE:  Self extubated this AM.  Now getting sleeper and needing increased oxygen.  VITAL SIGNS: BP (!) 167/81   Pulse (!) 119   Temp 99.8 F (37.7 C) (Oral)   Resp (!) 35   Wt 143 lb 1.3 oz (64.9 kg)   SpO2 (!) 86%   BMI 23.81 kg/m   VENTILATOR SETTINGS: Vent Mode: PRVC FiO2 (%):  [40 %-100 %] 40 % Set Rate:  [18 bmp] 18 bmp Vt Set:  [460 mL] 460 mL PEEP:  [5 cmH20] 5 cmH20 Plateau Pressure:  [13 cmH20-24 cmH20] 24 cmH20  INTAKE / OUTPUT: I/O last 3 completed shifts: In: 1129.2 [I.V.:1069.2; NG/GT:60] Out: 600 [Urine:600]  PHYSICAL EXAMINATION:  General - somnelent Eyes - pupils reactive ENT - dry mucosa Cardiac - regular, tachycardic, no murmur Chest - decreased BS Abd - soft, non tender Ext - no edema Skin - no rashes Neuro - moves extremities  LABS:  BMET Recent Labs  Lab 04/18/17 1123 04/19/17 0517  NA 135 140  K 3.2* 3.8  CL 94* 100*  CO2 28 29  BUN 12 15  CREATININE 0.90 0.99  GLUCOSE 147* 142*    Electrolytes Recent Labs  Lab 04/18/17 1123 04/19/17 0517  CALCIUM 9.6 9.2  MG  --  1.5*  PHOS  --  2.7    CBC Recent Labs  Lab 04/18/17 1123  04/19/17 0517  WBC 9.5 12.4*  HGB 15.6* 13.5  HCT 46.9* 43.0  PLT 249 195    Coag's Recent Labs  Lab 04/18/17 1123  APTT 31  INR 0.92    Sepsis Markers Recent Labs  Lab 04/18/17 1140  LATICACIDVEN 1.51    ABG Recent Labs  Lab 04/18/17 1448 04/19/17 0350  PHART 7.384 7.455*  PCO2ART 61.1* 43.9  PO2ART 294.0* 51.6*    Liver Enzymes Recent Labs  Lab 04/18/17 1123  AST 19  ALT 11*  ALKPHOS 63  BILITOT 0.4  ALBUMIN 3.7    Cardiac Enzymes No results for input(s): TROPONINI, PROBNP in the last 168 hours.  Glucose Recent Labs  Lab 04/18/17 1256 04/18/17 1646 04/18/17 1909 04/18/17 2311 04/19/17 0325 04/19/17 0723  GLUCAP 109* 141* 141* 144* 141* 145*    Imaging Ct Head Wo Contrast  Result Date: 04/18/2017 CLINICAL DATA:  Stroke-like symptoms.  Right facial droop. EXAM: CT HEAD WITHOUT CONTRAST TECHNIQUE: Contiguous axial images were obtained from the base of the skull through the vertex without intravenous contrast. COMPARISON:  06/04/2015 FINDINGS: Brain: No evidence of acute infarction, hemorrhage, extra-axial collection, ventriculomegaly, or mass effect. Generalized cerebral atrophy. Periventricular white matter low attenuation likely secondary to microangiopathy. Vascular: Cerebrovascular atherosclerotic calcifications are noted. Skull: Negative  for fracture or focal lesion. Sinuses/Orbits: Visualized portions of the orbits are unremarkable. Visualized portions of the paranasal sinuses and mastoid air cells are unremarkable. Other: None. IMPRESSION: 1. No acute intracranial pathology. 2. Chronic microvascular disease and cerebral atrophy. Electronically Signed   By: Kathreen Devoid   On: 04/18/2017 12:18   Dg Chest Port 1 View  Result Date: 04/19/2017 CLINICAL DATA:  Respiratory failure. EXAM: PORTABLE CHEST 1 VIEW COMPARISON:  Yesterday FINDINGS: Endotracheal tube tip is 3 cm above the clavicular heads. This is stable from yesterday. An orogastric tube  reaches the stomach. New dense opacity at the left base at least partially from pleural fluid. There is also increased patchy density at the right base. No Kerley lines. No pneumothorax. Normal heart size. Large lung volumes. There is history of COPD. These results will be called to the ordering clinician or representative by the Radiologist Assistant, and communication documented in the PACS or zVision Dashboard. IMPRESSION: 1. Unchanged high endotracheal tube, tip 3 cm above the clavicular heads. 2. Increased opacity at the bases which could be infection, aspiration, or atelectasis. New small left pleural effusion. Electronically Signed   By: Monte Fantasia M.D.   On: 04/19/2017 09:33   Dg Chest Portable 1 View  Result Date: 04/18/2017 CLINICAL DATA:  Orogastric and endotracheal tube placement. EXAM: PORTABLE CHEST 1 VIEW COMPARISON:  04/18/2017 and 01/24/2017. FINDINGS: 1457 hour. Interval intubation. Tip of the endotracheal tube is just below the thoracic inlet. Orogastric tube projects below the diaphragm, tip not visualized. The heart size and mediastinal contours are stable. There is aortic atherosclerosis. There is increased patchy opacity at the left lung base. There is also mildly increased density projecting over the anterior aspect of the right 1st rib, possibly due to adjacent EKG snap. No acute osseous findings. IMPRESSION: 1. Tip of the endotracheal tube is just below the thoracic inlet. Consider advancement by 3-4 cm for more optimal positioning. Orogastric tube projects below the diaphragm. 2. Increased patchy left basilar opacity may reflect atelectasis or aspiration. Electronically Signed   By: Richardean Sale M.D.   On: 04/18/2017 15:18   Dg Chest Port 1 View  Result Date: 04/18/2017 CLINICAL DATA:  Stroke-like symptoms. EXAM: PORTABLE CHEST 1 VIEW COMPARISON:  Chest x-ray dated January 24, 2017. FINDINGS: The heart size and mediastinal contours are within normal limits. Normal pulmonary  vascularity. No focal consolidation, pleural effusion, or pneumothorax. No acute osseous abnormality. IMPRESSION: No active disease. Electronically Signed   By: Titus Dubin M.D.   On: 04/18/2017 11:47     STUDIES:  CT head 3/6 >>> chronic microvascular disease and cerebral atrophy  SIGNIFICANT EVENTS: 3/06 Admit 3/07 Self extubated  LINES/TUBES: ETT 3/6 >> 3/07   DISCUSSION: Mental status improved and she extubated herself.  Since then has become more lethargic and hypoxic.  Will try on Bipap, but concerned she might need reintubation.  ASSESSMENT / PLAN:  Acute on chronic hypoxic/hypercapnic respiratory failure. Compromised airway. COPD. - Bipap - oxygen to keep SpO2 88 to 95% - scheduled BDs  Acute metabolic encephalopathy likely from accident opiate and benzo overdose. Hx of anxiety, depression, and ADHD. - limit sedation as able - hold outpt xanax, norco, pamelor, rexulti, trintellix  Hx of HTN, HLD. - ASA, lipitor, HCTZ, toprol  Moderate protein calorie malnutrition. - tube feeds if she gets reintubated  DM type II. - SSI  DVT prophylaxis - lovenox SUP - protonix Nutrition - NPO Goals of care - full code  CC  time 31 minutes   Chesley Mires, MD [place] 04/19/2017, 11:30 AM Pager:  [phone] After 3pm call: [phone]

## 2017-04-19 NOTE — Progress Notes (Deleted)
Cardiology Office Note   Date:  04/19/2017   ID:  [name], DOB Aug 28, 1955, MRN [id number]  PCP:  Elwyn Reach, MD  Cardiologist:   No primary care provider on file. Referring:  ***  No chief complaint on file.     History of Present Illness: [name] is a 62 y.o. female who is referred by Dr. Bridgett Larsson for preop evaluation prior to left fempop bypass.  ***  The patient had a negative myoview in 2015.  Echo in 2017 had a normal EF and had moderate pulmonary HTN.  ***    Past medical history of COPD, asthma, GERD, depression, anxiety, hypertension, ADHD, hyperlipidemia, diabetes mellitus, tobacco abuse, who presented with productive cough and shortness of breath, and chest pain.  Patient reports that she has been having cough, shortness of breath and chest pain in the past 1 to 2 weeks. She has greenish sputum production. Her chest pain is located is in the right side of the chest, constant, nonradiating. Her chest pain is pleuritic and aggravated by deep breath. She also has some mild nausea, but no vomiting, abdominal pain or diarrhea. No symptoms for UTI. She was evaluated in urgent care, and was suggested to come in the emergency room for further evaluation and treatment. Of note, patient was seen in ED on 11/8 because of chest pain. She was given referral to cardiologist.  She denies fever, chills, abdominal pain, diarrhea, dysuria, urgency, frequency, hematuria, skin rashes or leg swelling.  Work up in the ED demonstrates negative troponin; proBNP 9.9; no leukocytosis; possible bronchitis on chest x-ray. She is admitted to inpatient for further evaluation and treatment.   Past Medical History:  Diagnosis Date  . ADD (attention deficit disorder with hyperactivity)   . Anxiety   . Arthritis    "neck" (06/16/2015)  . Asthma   . Basal cell carcinoma, face   . Cervical cancer ([place])   . Chronic bronchitis ([place])   . 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 COPD  (chronic obstructive pulmonary disease) ([place])   . Depression   . GERD (gastroesophageal reflux disease)   . Hypertension   . On home oxygen therapy    "2L at bedtime" (06/16/2015)  . Pneumonia 1980s X 1  . Squamous cell carcinoma, face   . Thyroid cyst   . Type II diabetes mellitus ([place])     Past Surgical History:  Procedure Laterality Date  . ABDOMINAL AORTOGRAM N/A 04/05/2017   Procedure: ABDOMINAL AORTOGRAM;  Surgeon: Conrad Cottontown, MD;  Location: [facility];  Service: Vascular;  Laterality: N/A;  . ABDOMINAL HYSTERECTOMY    . ANTERIOR CERVICAL DECOMP/DISCECTOMY FUSION    . AORTOGRAM Left 04/05/2017   Procedure: Angiogram with left leg runoff;  Surgeon: Conrad Etna, MD;  Location: [facility];  Service: Vascular;  Laterality: Left;  . BACK SURGERY    . BASAL CELL CARCINOMA EXCISION     "face"  . COLECTOMY    . EXCISIONAL HEMORRHOIDECTOMY    . HERNIA REPAIR  X 3   incisional/ventral hernia repair "in my stomach where the womb is"  . LAPAROSCOPIC CHOLECYSTECTOMY    . OOPHORECTOMY  2006   cervical cancer  . RECTOVAGINAL FISTULA CLOSURE  2012  . SQUAMOUS CELL CARCINOMA EXCISION    . TUBAL LIGATION    . VULVA SURGERY  2006   Cancer in situ  . WRIST SURGERY Right X 1   "accidentially cut it"  . WRIST SURGERY Left    "  don't remember why"     No current facility-administered medications for this visit.    No current outpatient medications on file.   Facility-Administered Medications Ordered in Other Visits  Medication Dose Route Frequency Provider Last Rate Last Dose  . aspirin chewable tablet 81 mg  81 mg Per Tube Daily Erick Colace, NP   Stopped at 04/19/17 0901  . atorvastatin (LIPITOR) tablet 20 mg  20 mg Per Tube q1800 Erick Colace, NP   20 mg at 04/18/17 1756  . enoxaparin (LOVENOX) injection 40 mg  40 mg Subcutaneous Q24H Erick Colace, NP   40 mg at 04/19/17 1757  . insulin aspart (novoLOG) injection 0-15 Units  0-15 Units Subcutaneous Q4H Erick Colace, NP   5  Units at 04/19/17 2004  . ipratropium-albuterol (DUONEB) 0.5-2.5 (3) MG/3ML nebulizer solution 3 mL  3 mL Nebulization Q6H Sood, Vineet, MD   3 mL at 04/19/17 1933  . labetalol (NORMODYNE,TRANDATE) injection 10 mg  10 mg Intravenous Q4H PRN Chesley Mires, MD   10 mg at 04/19/17 1311  . lactated ringers infusion   Intravenous Continuous Erick Colace, NP 75 mL/hr at 04/19/17 0535    . pantoprazole (PROTONIX) injection 40 mg  40 mg Intravenous QHS Erick Colace, NP   40 mg at 04/18/17 2200    Allergies:   Contrast media [iodinated diagnostic agents]; Doxycycline; Erythromycin; Penicillins; Amoxicillin; and Metronidazole    Social History:  The patient  reports that she has been smoking cigarettes.  She has a 47.00 pack-year smoking history. she has never used smokeless tobacco. She reports that she does not drink alcohol or use drugs.   Family History:  The patient's ***family history includes COPD in her father; Cancer in her father and paternal aunt.    ROS:  Please see the history of present illness.   Otherwise, review of systems are positive for {NONE DEFAULTED:18576::"none"}.   All other systems are reviewed and negative.    PHYSICAL EXAM: VS:  There were no vitals taken for this visit. , BMI There is no height or weight on file to calculate BMI. GENERAL:  Well appearing HEENT:  Pupils equal round and reactive, fundi not visualized, oral mucosa unremarkable NECK:  No jugular venous distention, waveform within normal limits, carotid upstroke brisk and symmetric, no bruits, no thyromegaly LYMPHATICS:  No cervical, inguinal adenopathy LUNGS:  Clear to auscultation bilaterally BACK:  No CVA tenderness CHEST:  Unremarkable HEART:  PMI not displaced or sustained,S1 and S2 within normal limits, no S3, no S4, no clicks, no rubs, *** murmurs ABD:  Flat, positive bowel sounds normal in frequency in pitch, no bruits, no rebound, no guarding, no midline pulsatile mass, no hepatomegaly, no  splenomegaly EXT:  2 plus pulses throughout, no edema, no cyanosis no clubbing SKIN:  No rashes no nodules NEURO:  Cranial nerves II through XII grossly intact, motor grossly intact throughout PSYCH:  Cognitively intact, oriented to person place and time    EKG:  EKG {ACTION; IS/IS WUX:32440102} ordered today. The ekg ordered today demonstrates ***   Recent Labs: 01/24/2017: B Natriuretic Peptide 81.0 01/26/2017: TSH 0.080 04/18/2017: ALT 11 04/19/2017: BUN 15; Creatinine, Ser 0.99; Hemoglobin 13.5; Magnesium 1.5; Platelets 195; Potassium 3.8; Sodium 140    Lipid Panel    Component Value Date/Time   CHOL 174 08/09/2016 0212   TRIG 211 (H) 04/18/2017 1418   HDL 38 (L) 08/09/2016 0212   CHOLHDL 4.6 08/09/2016 0212   VLDL  27 08/09/2016 0212   LDLCALC 109 (H) 08/09/2016 0212      Wt Readings from Last 3 Encounters:  04/19/17 143 lb 1.3 oz (64.9 kg)  04/05/17 145 lb (65.8 kg)  03/23/17 157 lb (71.2 kg)      Other studies Reviewed: Additional studies/ records that were reviewed today include: ***. Review of the above records demonstrates:  Please see elsewhere in the note.  ***   ASSESSMENT AND PLAN:  PREOP:  ***  PULMONARY HTN:  ***   Current medicines are reviewed at length with the patient today.  The patient {ACTIONS; HAS/DOES NOT HAVE:19233} concerns regarding medicines.  The following changes have been made:  {PLAN; NO CHANGE:13088:s}  Labs/ tests ordered today include: *** No orders of the defined types were placed in this encounter.    Disposition:   FU with ***    Signed, Minus Breeding, MD  04/19/2017 8:53 PM    [facility]

## 2017-04-19 NOTE — Progress Notes (Signed)
Patient vent alarming upon initial assessment.  When entering room, patient hand on ETT.  ETT on patients chest.  Placed patient on 6L [place].  Patient lethargic but answering questions appropriately.  No s/s of distress or pain.  Will continue to monitor.

## 2017-04-20 ENCOUNTER — Inpatient Hospital Stay (HOSPITAL_COMMUNITY): Payer: Medicare Other

## 2017-04-20 ENCOUNTER — Other Ambulatory Visit: Payer: Self-pay

## 2017-04-20 ENCOUNTER — Ambulatory Visit: Payer: Medicare Other | Admitting: Cardiology

## 2017-04-20 DIAGNOSIS — I340 Nonrheumatic mitral (valve) insufficiency: Secondary | ICD-10-CM

## 2017-04-20 LAB — GLUCOSE, CAPILLARY
GLUCOSE-CAPILLARY: 149 mg/dL — AB (ref 65–99)
GLUCOSE-CAPILLARY: 232 mg/dL — AB (ref 65–99)
Glucose-Capillary: 128 mg/dL — ABNORMAL HIGH (ref 65–99)
Glucose-Capillary: 113 mg/dL — ABNORMAL HIGH (ref 65–99)
Glucose-Capillary: 200 mg/dL — ABNORMAL HIGH (ref 65–99)

## 2017-04-20 LAB — ECHOCARDIOGRAM COMPLETE: Weight: 2261.04 oz

## 2017-04-20 MED ORDER — METOPROLOL TARTRATE 5 MG/5ML IV SOLN
5.0000 mg | Freq: Four times a day (QID) | INTRAVENOUS | Status: DC | PRN
  Administered 2017-04-20: 5 mg via INTRAVENOUS
  Filled 2017-04-20: qty 5

## 2017-04-20 MED ORDER — DEXTROSE 50 % IV SOLN: 50.0000 mL | Freq: Once | INTRAVENOUS | Status: DC

## 2017-04-20 MED ORDER — METOPROLOL TARTRATE 50 MG PO TABS
50.0000 mg | ORAL_TABLET | Freq: Two times a day (BID) | ORAL | Status: DC
  Administered 2017-04-20 – 2017-04-21 (×3): 50 mg via ORAL
  Filled 2017-04-20 (×3): qty 1

## 2017-04-20 MED ORDER — MORPHINE SULFATE (PF) 4 MG/ML IV SOLN
1.0000 mg | INTRAVENOUS | Status: DC | PRN
  Administered 2017-04-20 – 2017-04-21 (×9): 1 mg via INTRAVENOUS
  Filled 2017-04-20 (×9): qty 1

## 2017-04-20 MED ORDER — METRONIDAZOLE IN NACL 5-0.79 MG/ML-% IV SOLN
500.0000 mg | Freq: Three times a day (TID) | INTRAVENOUS | Status: DC
  Administered 2017-04-20 – 2017-04-21 (×3): 500 mg via INTRAVENOUS
  Filled 2017-04-20 (×3): qty 100

## 2017-04-20 MED ORDER — ASPIRIN 81 MG PO CHEW
81.0000 mg | CHEWABLE_TABLET | Freq: Every day | ORAL | Status: DC
  Administered 2017-04-21: 81 mg via ORAL
  Filled 2017-04-20: qty 1

## 2017-04-20 MED ORDER — INSULIN ASPART 100 UNIT/ML ~~LOC~~ SOLN
0.0000 [IU] | Freq: Three times a day (TID) | SUBCUTANEOUS | Status: DC
  Administered 2017-04-20: 3 [IU] via SUBCUTANEOUS
  Administered 2017-04-21 (×2): 2 [IU] via SUBCUTANEOUS

## 2017-04-20 MED ORDER — METOPROLOL TARTRATE 5 MG/5ML IV SOLN
5.0000 mg | Freq: Three times a day (TID) | INTRAVENOUS | Status: DC
  Administered 2017-04-20: 5 mg via INTRAVENOUS
  Filled 2017-04-20: qty 5

## 2017-04-20 MED ORDER — INSULIN ASPART 100 UNIT/ML ~~LOC~~ SOLN
0.0000 [IU] | Freq: Every day | SUBCUTANEOUS | Status: DC
  Administered 2017-04-20: 2 [IU] via SUBCUTANEOUS

## 2017-04-20 MED ORDER — SODIUM CHLORIDE 0.9 % IV SOLN
1.0000 g | INTRAVENOUS | Status: DC
  Administered 2017-04-20 – 2017-04-21 (×2): 1 g via INTRAVENOUS
  Filled 2017-04-20 (×2): qty 10

## 2017-04-20 MED ORDER — ATORVASTATIN CALCIUM 20 MG PO TABS
20.0000 mg | ORAL_TABLET | Freq: Every day | ORAL | Status: DC
  Administered 2017-04-20: 20 mg via ORAL
  Filled 2017-04-20: qty 1

## 2017-04-20 MED ORDER — RESOURCE THICKENUP CLEAR PO POWD
Freq: Once | ORAL | Status: DC
  Filled 2017-04-20 (×2): qty 125

## 2017-04-20 MED ORDER — LACTATED RINGERS IV SOLN: INTRAVENOUS | Status: DC | PRN

## 2017-04-20 MED ORDER — ENSURE ENLIVE PO LIQD
237.0000 mL | Freq: Two times a day (BID) | ORAL | Status: DC
  Administered 2017-04-20: 237 mL via ORAL

## 2017-04-20 NOTE — Evaluation (Signed)
Physical Therapy Evaluation Patient Details Name: [name] MRN: [id number] DOB: 12/09/1955 Today's Date: 04/20/2017   History of Present Illness  62yo female smoker with hx COPD on 3 liters oxygen, hx cervical cancer, CHF, DM, HTN, PVD with recent aortogram by Dr. Bridgett Larsson 2/21 presented 3/6 after being found altered by family with reported R sided facial droop and R sided weakness, CT head negative for acute changes, UDS POS for benzos and opioids.  Clinical Impression  Patient presents with decreased independence and safety with mobility due to decreased strength, decreased activity tolerance with HR up to 140, poor deficit and safety awareness and decreased balance.  She will benefit from skilled PT in the acute setting to allow return home with family support following SNF level rehab stay. Should cognition return to baseline could go home with family assist.     Follow Up Recommendations SNF    Equipment Recommendations  Other (comment)(TBA)    Recommendations for Other Services       Precautions / Restrictions Precautions Precautions: Fall      Mobility  Bed Mobility Overal bed mobility: Needs Assistance Bed Mobility: Supine to Sit     Supine to sit: Min assist;HOB elevated     General bed mobility comments: pt getting up upon my entry, assist for scooting forward, for safety and balance  Transfers Overall transfer level: Needs assistance   Transfers: Sit to/from Stand;Stand Pivot Transfers Sit to Stand: Min guard Stand pivot transfers: Min assist       General transfer comment: pt standing basically under her own power with poor safety awareness, minguard for safety, balance and for lines   Ambulation/Gait                Stairs            Wheelchair Mobility    Modified Rankin (Stroke Patients Only)       Balance Overall balance assessment: Needs assistance Sitting-balance support: Feet unsupported Sitting balance-Leahy Scale: Fair      Standing balance support: No upper extremity supported Standing balance-Leahy Scale: Poor Standing balance comment: patient unsteady in standing min A required and unaware of/not compensating for fall risk                             Pertinent Vitals/Pain Pain Assessment: No/denies pain    Home Living Family/patient expects to be discharged to:: Private residence Living Arrangements: Other relatives;Children Available Help at Discharge: Family Type of Home: Mobile home Home Access: Stairs to enter   Entrance Stairs-Number of Steps: 2 Home Layout: One level Home Equipment: Environmental consultant - 2 wheels      Prior Function Level of Independence: Needs assistance   Gait / Transfers Assistance Needed: Independent but hx of multiple falls  ADL's / Homemaking Assistance Needed: granddaughter performs IADLS  Comments: information from previous hospitalization 2 months ago due to pt confused and no family present     Hand Dominance        Extremity/Trunk Assessment   Upper Extremity Assessment Upper Extremity Assessment: Generalized weakness    Lower Extremity Assessment Lower Extremity Assessment: Generalized weakness       Communication   Communication: No difficulties  Cognition Arousal/Alertness: Awake/alert Behavior During Therapy: Anxious;Impulsive Overall Cognitive Status: Impaired/Different from baseline Area of Impairment: Orientation;Attention;Following commands;Safety/judgement                 Orientation Level: Disoriented to;Time Current Attention Level: Focused  Following Commands: Follows one step commands inconsistently Safety/Judgement: Decreased awareness of safety;Decreased awareness of deficits     General Comments: getting up into standing despite frequent redirection to remain safe and seated due to elevated HR and lines tangled      General Comments General comments (skin integrity, edema, etc.): Patient constantly getting up  to stand from recliner, HR up to 145, on 6L hi flow O2. NT in room end of session with pt in recliner to assist with bath and linen change    Exercises     Assessment/Plan    PT Assessment Patient needs continued PT services  PT Problem List Decreased strength;Decreased mobility;Decreased safety awareness;Decreased balance;Decreased cognition;Cardiopulmonary status limiting activity;Decreased knowledge of precautions       PT Treatment Interventions DME instruction;Therapeutic activities;Cognitive remediation;Gait training;Therapeutic exercise;Balance training;Patient/family education;Stair training;Functional mobility training    PT Goals (Current goals can be found in the Care Plan section)  Acute Rehab PT Goals Patient Stated Goal: None stated PT Goal Formulation: Patient unable to participate in goal setting Time For Goal Achievement: 05/04/17 Potential to Achieve Goals: Fair    Frequency Min 3X/week   Barriers to discharge        Co-evaluation               AM-PAC PT "6 Clicks" Daily Activity  Outcome Measure Difficulty turning over in bed (including adjusting bedclothes, sheets and blankets)?: A Lot Difficulty moving from lying on back to sitting on the side of the bed? : A Lot Difficulty sitting down on and standing up from a chair with arms (e.g., wheelchair, bedside commode, etc,.)?: A Lot Help needed moving to and from a bed to chair (including a wheelchair)?: A Little Help needed walking in hospital room?: A Little Help needed climbing 3-5 steps with a railing? : A Lot 6 Click Score: 14    End of Session Equipment Utilized During Treatment: Gait belt Activity Tolerance: Treatment limited secondary to medical complications (Comment)(HR up to 140's) Patient left: with nursing/sitter in room;in chair Nurse Communication: Mobility status PT Visit Diagnosis: Unsteadiness on feet (R26.81);Other symptoms and signs involving the nervous system (R29.898);Other  abnormalities of gait and mobility (R26.89)    Time: 8280-0349 PT Time Calculation (min) (ACUTE ONLY): 33 min   Charges:   PT Evaluation $PT Eval High Complexity: 1 High PT Treatments $Therapeutic Activity: 8-22 mins   PT G CodesMagda Kiel, [place] [phone] 04/20/2017   Reginia Naas 04/20/2017, 5:05 PM

## 2017-04-20 NOTE — Progress Notes (Signed)
PULMONARY / CRITICAL CARE MEDICINE   Name: [name] MRN: [id number] DOB: 1955-05-12    ADMISSION DATE:  04/18/2017  REFERRING MD:  Dr. Leonette Monarch, ER  CHIEF COMPLAINT:  AMS, intubated   HISTORY OF PRESENT ILLNESS:   62yo female smoker with hx COPD on 3 liters oxygen, hx cervical cancer, CHF, DM, HTN, PVD with recent aortogram by Dr. Bridgett Larsson 2/21 presented 3/6 after being found altered by family with reported R sided facial droop and R sided weakness although these were not noted once she arrived in ER. In ER she was increasingly somnolent despite narcan, initial CT head negative for acute changes. She was afebrile with essentially normal labs, hemodynamically stable, UDS POS for benzos and opioids.  She was intubated for airway protection in ER and PCCM called for ICU admission.   SUBJECTIVE:  She thinks she got her medications mixed up at home.  She wants something to eat and drink.  VITAL SIGNS: BP (!) 148/78   Pulse (!) 117   Temp 99.5 F (37.5 C) (Oral)   Resp (!) 27   Wt 141 lb 5 oz (64.1 kg)   SpO2 97%   BMI 23.52 kg/m   INTAKE / OUTPUT: I/O last 3 completed shifts: In: 2685 [I.V.:2625; NG/GT:60] Out: 1135 [Urine:1135]  PHYSICAL EXAMINATION:  General - more alert Eyes - pupils reactive ENT - dry mucosa Cardiac - regular, no murmur Chest - no wheeze, rales Abd - soft, non tender Ext - no edema Skin - no rashes Neuro - follows commands  LABS:  BMET Recent Labs  Lab 04/18/17 1123 04/19/17 0517  NA 135 140  K 3.2* 3.8  CL 94* 100*  CO2 28 29  BUN 12 15  CREATININE 0.90 0.99  GLUCOSE 147* 142*    Electrolytes Recent Labs  Lab 04/18/17 1123 04/19/17 0517  CALCIUM 9.6 9.2  MG  --  1.5*  PHOS  --  2.7    CBC Recent Labs  Lab 04/18/17 1123 04/19/17 0517  WBC 9.5 12.4*  HGB 15.6* 13.5  HCT 46.9* 43.0  PLT 249 195    Coag's Recent Labs  Lab 04/18/17 1123  APTT 31  INR 0.92    Sepsis Markers Recent Labs  Lab 04/18/17 1140   LATICACIDVEN 1.51    ABG Recent Labs  Lab 04/18/17 1448 04/19/17 0350 04/19/17 1320  PHART 7.384 7.455* 7.457*  PCO2ART 61.1* 43.9 39.5  PO2ART 294.0* 51.6* 58.1*    Liver Enzymes Recent Labs  Lab 04/18/17 1123  AST 19  ALT 11*  ALKPHOS 63  BILITOT 0.4  ALBUMIN 3.7    Cardiac Enzymes No results for input(s): TROPONINI, PROBNP in the last 168 hours.  Glucose Recent Labs  Lab 04/19/17 1225 04/19/17 1516 04/19/17 1924 04/19/17 2315 04/20/17 0340 04/20/17 0738  GLUCAP 192* 185* 207* 114* 149* 128*    Imaging No results found.   STUDIES:  CT head 3/6 >>> chronic microvascular disease and cerebral atrophy  SIGNIFICANT EVENTS: 3/06 Admit 3/07 Self extubated 3/08 Transfer to telemetry  LINES/TUBES: ETT 3/6 >> 3/07   DISCUSSION: Continues to slowly improve.  Likely accidental overdose of her home medications causing altered mental status.  Concern about her swallowing after self extubation, and she is weak.  ASSESSMENT / PLAN:  Acute on chronic hypoxic/hypercapnic respiratory failure. Compromised airway. COPD. - oxygen to keep SpO2 90 to 95% - d/c Bipap - scheduled BDs  Acute metabolic encephalopathy likely from accident opiate and benzo overdose. Hx of anxiety,  depression, and ADHD. - improved - prn morphine for leg pain - hold outpt xanax, norco, pamelor, rexulti, trintellix for now  Hx of HTN, HLD, PAD, Sinus tachycardia. - ASA, lipitor when able to swallow - hold HCTZ, toprol - prn labetalol IV for SBP > 160  Moderate protein calorie malnutrition. Dysphagia. - speech to assess swallowing before advancing diet - continue IV fluids  DM type II. - SSI - hold outpt glucotrol, glucophage  Deconditioning. - PT assessment  DVT prophylaxis - lovenox SUP - d/c protonix >> SUP no longer indicated Nutrition - NPO Goals of care - full code  Transfer to telemetry 3/08 >> [facility] 3/09 and PCCM off.   Chesley Mires, MD [facility]  Pulmonary/Critical Care 04/20/2017, 8:00 AM Pager:  [phone] After 3pm call: [phone]

## 2017-04-20 NOTE — Progress Notes (Signed)
  Echocardiogram 2D Echocardiogram has been performed.  [name] 04/20/2017, 4:21 PM

## 2017-04-20 NOTE — Progress Notes (Signed)
Patient transporting to 5W, report called and family called to notify of new room.

## 2017-04-20 NOTE — Progress Notes (Signed)
Attempted echo.  However, HR was too high (129bpm)

## 2017-04-20 NOTE — Progress Notes (Addendum)
Pharmacy Antibiotic Note  [name] is a 62 y.o. female admitted on 04/18/2017 with AMS and right-sided facial droop/weakness.   Pharmacy has been consulted for Rocephin and Flagyl dosing for aspiration PNA.  Patient's renal function is stable.  Tmax 102.4 WBC 12.4.   Plan: Rocephin 1gm IV Q24H Flagyl 500mg  IV Q8H Pharmacy will sign off and follow peripherally.  Thank you for the consult!   Weight: 141 lb 5 oz (64.1 kg)  Temp (24hrs), Avg:100.3 F (37.9 C), Min:98.9 F (37.2 C), Max:102.4 F (39.1 C)  Recent Labs  Lab 04/18/17 1123 04/18/17 1140 04/19/17 0517  WBC 9.5  --  12.4*  CREATININE 0.90  --  0.99  LATICACIDVEN  --  1.51  --     Estimated Creatinine Clearance: 52.3 mL/min (by C-G formula based on SCr of 0.99 mg/dL).    Allergies  Allergen Reactions  . Contrast Media [Iodinated Diagnostic Agents] Nausea And Vomiting  . Doxycycline Nausea And Vomiting  . Erythromycin Nausea And Vomiting  . Penicillins Itching and Rash    Has patient had a PCN reaction causing immediate rash, facial/tongue/throat swelling, SOB or lightheadedness with hypotension: YES Has patient had a PCN reaction causing severe rash involving mucus membranes or skin necrosis: NO Has patient had a PCN reaction that required hospitalization: NO Has patient had a PCN reaction occurring within the last 10 years: Unknown If all of the above answers are "NO", then may proceed with Cephalosporin use.   Marland Kitchen Amoxicillin Other (See Comments)    Reaction not recalled by family member and the patient is presently intubated  . Metronidazole Hives and Rash    CTX 3/8 >> Flagyl 3/8 >>  MRSA PCR - negative   Raychell Holcomb D. Mina Marble, PharmD, BCPS Pager:  [phone] 04/20/2017, 12:42 PM

## 2017-04-20 NOTE — Progress Notes (Signed)
Initial Nutrition Assessment  DOCUMENTATION CODES:   Non-severe (moderate) malnutrition in context of chronic illness  INTERVENTION:   Magic cup BID with meals, each supplement provides 290 kcal and 9 grams of protein  Ensure Enlive po BID, each supplement provides 350 kcal and 20 grams of protein   NUTRITION DIAGNOSIS:   Moderate Malnutrition related to chronic illness(COPD on 3L oxygen, CHF, chronic wound) as evidenced by mild fat depletion, moderate fat depletion, mild muscle depletion, moderate muscle depletion.  GOAL:   Patient will meet greater than or equal to 90% of their needs  MONITOR:   PO intake, Supplement acceptance, Labs, Weight trends  REASON FOR ASSESSMENT:   Malnutrition Screening Tool    ASSESSMENT:   62 yo female admitted with acute on chronic respiratory failure, acute metabolic encephalopathy likely from accidental overdose. Pt with hx of COPD on 3L oxygen, CHF, DM, HTN, PVD, hx of cervical cancer, chronic wound  3/7 Self-Extubated  Pt currently on HFNC Diet advanced to Dysphagia II, Nectar Thick diet per SLP today Pt reports appetite fair. Typically eats 2 meals per day at home and snacks minimally. Meals are usually frozen meals, easy to fix. Pt takes Vitamin D but no other supplements at home.   Pt reports she has lost weight. Reports UBW around 200 pounds but pt has no idea when she last weight this. Current wt 141 pounds. Per weight encounters, pt weighed 151 pounds in December 2018, 154 pounds in May 2017. No clinically significant wt loss over the past year. However weight chronically trending down. Appears pt weighed 195 pounds in 2016  Pt with chronic abdominal wound which she reports she has had for years  Labs: reviewed Meds: LR at 75 ml/hr  NUTRITION - FOCUSED PHYSICAL EXAM:    Most Recent Value  Orbital Region  Moderate depletion  Upper Arm Region  No depletion  Thoracic and Lumbar Region  No depletion  Buccal Region  Mild  depletion  Temple Region  Moderate depletion  Clavicle Bone Region  Mild depletion  Clavicle and Acromion Bone Region  Mild depletion  Scapular Bone Region  Mild depletion  Dorsal Hand  No depletion  Patellar Region  Mild depletion  Anterior Thigh Region  Mild depletion  Posterior Calf Region  Moderate depletion [Left LE greater depletion than Right]  Edema (RD Assessment)  None  Hair  Reviewed  Eyes  Reviewed  Mouth  Reviewed  Skin  Reviewed  Nails  Reviewed       Diet Order:  DIET DYS 2 Room service appropriate? Yes; Fluid consistency: Nectar Thick  EDUCATION NEEDS:   No education needs have been identified at this time  Skin:  Skin Assessment: Skin Integrity Issues: Other: chronic full thickness abdominal wound; seen by WOC  Last BM:  prior to admission  Height:   Ht Readings from Last 1 Encounters:  04/05/17 5\' 5"  (1.651 m)    Weight:   Wt Readings from Last 1 Encounters:  04/20/17 141 lb 5 oz (64.1 kg)    Ideal Body Weight:     BMI:  Body mass index is 23.52 kg/m.  Estimated Nutritional Needs:   Kcal:  6599-3570 kcals  Protein:  88-98 g  Fluid:  >/= 1.8 L   Kerman Passey MS, RD, LDN, CNSC [phone] Pager  [phone] Weekend/On-Call Pager

## 2017-04-20 NOTE — Evaluation (Signed)
Clinical/Bedside Swallow Evaluation Patient Details  Name: [name] MRN: [id number] Date of Birth: 11/17/1955  Today's Date: 04/20/2017 Time: SLP Start Time (ACUTE ONLY): 1055 SLP Stop Time (ACUTE ONLY): 1110 SLP Time Calculation (min) (ACUTE ONLY): 15 min  Past Medical History:  Past Medical History:  Diagnosis Date  . ADD (attention deficit disorder with hyperactivity)   . Anxiety   . Arthritis    "neck" (06/16/2015)  . Asthma   . Basal cell carcinoma, face   . Cervical cancer ([place])   . Chronic bronchitis ([place])   . 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 COPD (chronic obstructive pulmonary disease) ([place])   . Depression   . GERD (gastroesophageal reflux disease)   . Hypertension   . On home oxygen therapy    "2L at bedtime" (06/16/2015)  . Pneumonia 1980s X 1  . Squamous cell carcinoma, face   . Thyroid cyst   . Type II diabetes mellitus ([place])    Past Surgical History:  Past Surgical History:  Procedure Laterality Date  . ABDOMINAL AORTOGRAM N/A 04/05/2017   Procedure: ABDOMINAL AORTOGRAM;  Surgeon: Conrad Foxhome, MD;  Location: [facility];  Service: Vascular;  Laterality: N/A;  . ABDOMINAL HYSTERECTOMY    . ANTERIOR CERVICAL DECOMP/DISCECTOMY FUSION    . AORTOGRAM Left 04/05/2017   Procedure: Angiogram with left leg runoff;  Surgeon: Conrad Redland, MD;  Location: [facility];  Service: Vascular;  Laterality: Left;  . BACK SURGERY    . BASAL CELL CARCINOMA EXCISION     "face"  . COLECTOMY    . EXCISIONAL HEMORRHOIDECTOMY    . HERNIA REPAIR  X 3   incisional/ventral hernia repair "in my stomach where the womb is"  . LAPAROSCOPIC CHOLECYSTECTOMY    . OOPHORECTOMY  2006   cervical cancer  . RECTOVAGINAL FISTULA CLOSURE  2012  . SQUAMOUS CELL CARCINOMA EXCISION    . TUBAL LIGATION    . VULVA SURGERY  2006   Cancer in situ  . WRIST SURGERY Right X 1   "accidentially cut it"  . WRIST SURGERY Left    "don't remember why"   HPI:  62yo female smoker with hx COPD on 3 liters oxygen,  hx cervical cancer, CHF, DM, HTN, PVD presented 3/6 after being found altered by family with reported R sided facial droop and R sided weakness although these were not noted once she arrived in ER. In ER she was increasingly somnolent despite narcan, initial CT head negative for acute changes. She was afebrile with essentially normal labs, hemodynamically stable, UDS POS for benzos and opioids. Intubated in ED 3/6, self-extubated 3/7.    Assessment / Plan / Recommendation Clinical Impression  Pt presents with an acute reversible dysphagia s/p brief intubation with self-extubation, compounded by decreased MS.  Demonstrates hoarse phonation, likely close to baseline given smoking hx; intermittent coughing and wet voice after consumption of ice chips and water - there is some potential for aspiration.  Tolerated purees and nectars without overt difficulty.  Recommend initiating a dysphagia 2 diet with nectar-thick liquids for now; give meds whole in puree.  Anticipate rapid diet progession. D/W RN.   SLP Visit Diagnosis: Dysphagia, unspecified (R13.10)    Aspiration Risk       Diet Recommendation   dysphagia 2, nectars  Medication Administration: Whole meds with puree    Other  Recommendations Oral Care Recommendations: Oral care BID Other Recommendations: Order thickener from pharmacy   Follow up Recommendations Other (comment)(tba)  Frequency and Duration min 2x/week  1 week       Prognosis Prognosis for Safe Diet Advancement: Good      Swallow Study   General Date of Onset: 04/18/17 HPI: 62yo female smoker with hx COPD on 3 liters oxygen, hx cervical cancer, CHF, DM, HTN, PVD presented 3/6 after being found altered by family with reported R sided facial droop and R sided weakness although these were not noted once she arrived in ER. In ER she was increasingly somnolent despite narcan, initial CT head negative for acute changes. She was afebrile with essentially normal labs,  hemodynamically stable, UDS POS for benzos and opioids. Intubated in ED 3/6, self-extubated 3/7.  Type of Study: Bedside Swallow Evaluation Previous Swallow Assessment: no Diet Prior to this Study: NPO Temperature Spikes Noted: No Respiratory Status: Nasal cannula History of Recent Intubation: Yes Date extubated: 04/19/17 Behavior/Cognition: Alert Oral Cavity Assessment: Dry Oral Care Completed by SLP: Recent completion by staff Oral Cavity - Dentition: Edentulous Vision: Functional for self-feeding Self-Feeding Abilities: Able to feed self;Needs assist Patient Positioning: Upright in bed Baseline Vocal Quality: Hoarse Volitional Cough: Strong Volitional Swallow: Able to elicit    Oral/Motor/Sensory Function Overall Oral Motor/Sensory Function: Within functional limits   Ice Chips Ice chips: Impaired Presentation: Spoon Pharyngeal Phase Impairments: Throat Clearing - Delayed   Thin Liquid Thin Liquid: Impaired Presentation: Straw;Cup Pharyngeal  Phase Impairments: Cough - Delayed    Nectar Thick Nectar Thick Liquid: Within functional limits Presentation: Cup   Honey Thick Honey Thick Liquid: Not tested   Puree Puree: Within functional limits   Solid   GO   Solid: Not tested        [name] 04/20/2017,11:17 AM

## 2017-04-20 NOTE — Progress Notes (Signed)
Has fever.  Concern for aspiration.  Will add unasyn.  Chesley Mires, MD [facility] Pulmonary/Critical Care 04/20/2017, 12:24 PM Pager:  [phone] After 3pm call: [phone]

## 2017-04-20 NOTE — Progress Notes (Signed)
Developed a fib.  Will give lopressor IV.  Chesley Mires, MD [facility] Pulmonary/Critical Care 04/20/2017, 8:22 AM Pager:  [phone] After 3pm call: [phone]

## 2017-04-20 NOTE — Consult Note (Signed)
[facility] Nurse wound consult note Reason for Consult: Consult requested for abd wound.  This is a chronic full thickness wound which has been present several years, refer to previous [facility] progress note in 5/17.  Wound has decreased in size and depth since that time but still drains. Pt states she has discussed with her surgeon in the past. Wound type: Chronic full thickness Measurement: .2X.2X.2cm Wound bed: red and moist Drainage (amount, consistency, odor) small amt pink drainage; no odor or fluctuance Periwound: intact Dressing procedure/placement/frequency: Foam dressing to protect from further injury and absorb drainage. Discussed plan of care with patient and she verbalized understanding. Please re-consult if further assistance is needed.  Thank-you,  Julien Girt MSN, [place], [place], [place], [place]

## 2017-04-21 ENCOUNTER — Inpatient Hospital Stay (HOSPITAL_COMMUNITY): Payer: Medicare Other

## 2017-04-21 DIAGNOSIS — J690 Pneumonitis due to inhalation of food and vomit: Secondary | ICD-10-CM

## 2017-04-21 LAB — BASIC METABOLIC PANEL
Anion gap: 13 (ref 5–15)
BUN: 9 mg/dL (ref 6–20)
CALCIUM: 8.3 mg/dL — AB (ref 8.9–10.3)
CO2: 24 mmol/L (ref 22–32)
CREATININE: 0.58 mg/dL (ref 0.44–1.00)
Chloride: 100 mmol/L — ABNORMAL LOW (ref 101–111)
Glucose, Bld: 151 mg/dL — ABNORMAL HIGH (ref 65–99)
Potassium: 2.8 mmol/L — ABNORMAL LOW (ref 3.5–5.1)
SODIUM: 137 mmol/L (ref 135–145)

## 2017-04-21 LAB — CBC
HCT: 34 % — ABNORMAL LOW (ref 36.0–46.0)
Hemoglobin: 11.1 g/dL — ABNORMAL LOW (ref 12.0–15.0)
MCH: 29.8 pg (ref 26.0–34.0)
MCHC: 32.6 g/dL (ref 30.0–36.0)
MCV: 91.2 fL (ref 78.0–100.0)
Platelets: 162 10*3/uL (ref 150–400)
RBC: 3.73 MIL/uL — AB (ref 3.87–5.11)
RDW: 13.2 % (ref 11.5–15.5)
WBC: 10.9 10*3/uL — AB (ref 4.0–10.5)

## 2017-04-21 LAB — GLUCOSE, CAPILLARY
Glucose-Capillary: 136 mg/dL — ABNORMAL HIGH (ref 65–99)
Glucose-Capillary: 126 mg/dL — ABNORMAL HIGH (ref 65–99)

## 2017-04-21 MED ORDER — CEFDINIR 300 MG PO CAPS: 300.0000 mg | ORAL_CAPSULE | Freq: Two times a day (BID) | ORAL | 0 refills | Status: DC

## 2017-04-21 MED ORDER — POTASSIUM CHLORIDE CRYS ER 20 MEQ PO TBCR
40.0000 meq | EXTENDED_RELEASE_TABLET | ORAL | Status: AC
  Administered 2017-04-21 (×2): 40 meq via ORAL
  Filled 2017-04-21 (×2): qty 2

## 2017-04-21 MED ORDER — APIXABAN 5 MG PO TABS: 5.0000 mg | ORAL_TABLET | Freq: Two times a day (BID) | ORAL | 1 refills | Status: DC

## 2017-04-21 NOTE — Progress Notes (Signed)
Pt discharged to home. Pt refused dc to rehab or SNF. Foley removed prior to dc, pt able to void without problem. VS stable. PIV removed, AVS reviewed, prescriptions provided to pt. Pt left unit via wheelchair, belongings in hand, to be transported home by friend and granddaughter.

## 2017-04-21 NOTE — Discharge Instructions (Signed)
Apixaban oral tablets °What is this medicine? °APIXABAN (a PIX a ban) is an anticoagulant (blood thinner). It is used to lower the chance of stroke in people with a medical condition called atrial fibrillation. It is also used to treat or prevent blood clots in the lungs or in the veins. °This medicine may be used for other purposes; ask your health care provider or pharmacist if you have questions. °COMMON BRAND NAME(S): Eliquis °What should I tell my health care provider before I take this medicine? °They need to know if you have any of these conditions: °-bleeding disorders °-bleeding in the brain °-blood in your stools (black or tarry stools) or if you have blood in your vomit °-history of stomach bleeding °-kidney disease °-liver disease °-mechanical heart valve °-an unusual or allergic reaction to apixaban, other medicines, foods, dyes, or preservatives °-pregnant or trying to get pregnant °-breast-feeding °How should I use this medicine? °Take this medicine by mouth with a glass of water. Follow the directions on the prescription label. You can take it with or without food. If it upsets your stomach, take it with food. Take your medicine at regular intervals. Do not take it more often than directed. Do not stop taking except on your doctor's advice. Stopping this medicine may increase your risk of a blot clot. Be sure to refill your prescription before you run out of medicine. °Talk to your pediatrician regarding the use of this medicine in children. Special care may be needed. °Overdosage: If you think you have taken too much of this medicine contact a poison control center or emergency room at once. °NOTE: This medicine is only for you. Do not share this medicine with others. °What if I miss a dose? °If you miss a dose, take it as soon as you can. If it is almost time for your next dose, take only that dose. Do not take double or extra doses. °What may interact with this medicine? °This medicine may  interact with the following: °-aspirin and aspirin-like medicines °-certain medicines for fungal infections like ketoconazole and itraconazole °-certain medicines for seizures like carbamazepine and phenytoin °-certain medicines that treat or prevent blood clots like warfarin, enoxaparin, and dalteparin °-clarithromycin °-NSAIDs, medicines for pain and inflammation, like ibuprofen or naproxen °-rifampin °-ritonavir °-St. John's wort °This list may not describe all possible interactions. Give your health care provider a list of all the medicines, herbs, non-prescription drugs, or dietary supplements you use. Also tell them if you smoke, drink alcohol, or use illegal drugs. Some items may interact with your medicine. °What should I watch for while using this medicine? °Visit your doctor or health care professional for regular checks on your progress. °Notify your doctor or health care professional and seek emergency treatment if you develop breathing problems; changes in vision; chest pain; severe, sudden headache; pain, swelling, warmth in the leg; trouble speaking; sudden numbness or weakness of the face, arm or leg. These can be signs that your condition has gotten worse. °If you are going to have surgery or other procedure, tell your doctor that you are taking this medicine. °What side effects may I notice from receiving this medicine? °Side effects that you should report to your doctor or health care professional as soon as possible: °-allergic reactions like skin rash, itching or hives, swelling of the face, lips, or tongue °-signs and symptoms of bleeding such as bloody or black, tarry stools; red or dark-brown urine; spitting up blood or brown material that looks like coffee   grounds; red spots on the skin; unusual bruising or bleeding from the eye, gums, or nose °This list may not describe all possible side effects. Call your doctor for medical advice about side effects. You may report side effects to FDA at  1-800-FDA-1088. °Where should I keep my medicine? °Keep out of the reach of children. °Store at room temperature between 20 and 25 degrees C (68 and 77 degrees F). Throw away any unused medicine after the expiration date. °NOTE: This sheet is a summary. It may not cover all possible information. If you have questions about this medicine, talk to your doctor, pharmacist, or health care provider. °© 2018 [employer] (2015-08-23 11:54:23) °  ° °

## 2017-04-21 NOTE — Discharge Summary (Addendum)
Physician Discharge Summary  GENELL THEDE UVO:[id number] DOB: 05-Sep-1955 DOA: 04/18/2017  PCP: Elwyn Reach, MD  Admit date: 04/18/2017 Discharge date: 04/21/2017  Admitted From: Home Disposition: Home  Recommendations for Outpatient Follow-up:  1. Follow up with PCP in 1-2 weeks 2. Please obtain BMP/CBC in one week 3. Please follow up on the following pending results:  Home Health: No Equipment/Devices 3 L nasal cannula oxygen  Discharge Condition: Stable CODE STATUS: Full Diet recommendation: Heart Healthy  Brief/Interim Summary:  #) Acute hypercarbic respiratory failure secondary to accidental alprazolam and opiate overdose: Patient was admitted with acute hypercarbic respiratory failure secondary to taking extra doses of alprazolam for sleep.  She was briefly intubated and then extubated back to her home 3 L nasal cannula.  She was told to not take extra doses of her alprazolam and to follow-up with her PCP to see if she could be weaned off this medication.  She was not interested in a weaning program at this time.  No alprazolam was given on discharge.  She was also noted to be positive for opiates and she does have a prescription for Norco 10-325 however she reports taking this as prescribed only 3 times a day.  #) COPD on 3LNC: Patient was given bronchodilators and she told to continue on her home budesonide/formoterol and tiotropium  #) Newonset atrial fibrillation: Patient developed this in the setting of getting bronchodilators and intubation for hypercarbic respiratory failure.  An echo showed a normal EF with LVH.  Patient's home metoprolol succinate 100 mg was restarted and patient was started on apixaban 5 mg twice daily for stroke prophylaxis.  #) Aspiration pneumonia: Patient was noted to have bibasilar infiltrates and fevers during her intubation.  She was started on ceftriaxone, metronidazole for possible aspiration pneumonia.  She was discharged home on cefdinir  orally to complete a course.  #) HTN: Patient was told to restart her home hydrochlorothiazide, metoprolol succinate.  She was told to discontinue her home aspirin 81 mg.  ##) T2DM: Patient was told to restart her home metformin and glipizide.  She was maintained on sliding scale insulin here.  #) Depression/pain: Patient's home medications were held until discharge.  She was told to restart her home vortioxetine, brepiprazole, nortriptyline.  She was again counseled on the risk of mixing opiates and benzodiazepines and taking extra doses of her opiates and benzodiazepines.   #) Hyperlipidemia: Patient was restarted on home atorvastatin prior to discharge.  #) Possible basal cell carcinoma on nose: Informed patient that she should see PCP for referral to dermatology soon as possible.  #) Deconditioning: Pt was seen by PT who recommended SNF/rehab however Pt adamantly refused.   Discharge Diagnoses:  Active Problems:   Altered mental status   Acute encephalopathy    Discharge Instructions   Allergies as of 04/21/2017      Reactions   Contrast Media [iodinated Diagnostic Agents] Nausea And Vomiting   Doxycycline Nausea And Vomiting   Erythromycin Nausea And Vomiting   Penicillins Itching, Rash   Has patient had a PCN reaction causing immediate rash, facial/tongue/throat swelling, SOB or lightheadedness with hypotension: YES Has patient had a PCN reaction causing severe rash involving mucus membranes or skin necrosis: NO Has patient had a PCN reaction that required hospitalization: NO Has patient had a PCN reaction occurring within the last 10 years: Unknown If all of the above answers are "NO", then may proceed with Cephalosporin use.   Amoxicillin Other (See Comments)   Reaction  not recalled by family member and the patient is presently intubated   Metronidazole Hives, Rash      Medication List    TAKE these medications   albuterol 108 (90 Base) MCG/ACT inhaler Commonly known  as:  PROVENTIL HFA;VENTOLIN HFA Inhale 2 puffs into the lungs 2 (two) times daily as needed for wheezing.   albuterol (2.5 MG/3ML) 0.083% nebulizer solution Commonly known as:  PROVENTIL Take 3 mLs (2.5 mg total) by nebulization every 4 (four) hours as needed for wheezing or shortness of breath.   alprazolam 2 MG tablet Commonly known as:  XANAX Take 0.5 tablets (1 mg total) by mouth at bedtime as needed for sleep or anxiety (Can take up to 3 thimes daily prn). Patient takes 1/2 tablet by mouth four times a day What changed:    how much to take  when to take this  additional instructions   apixaban 5 MG Tabs tablet Commonly known as:  ELIQUIS Take 1 tablet (5 mg total) by mouth 2 (two) times daily.   aspirin 81 MG chewable tablet Chew 81 mg by mouth daily.   atorvastatin 20 MG tablet Commonly known as:  LIPITOR Take 20 mg by mouth at bedtime   budesonide-formoterol 160-4.5 MCG/ACT inhaler Commonly known as:  SYMBICORT Inhale 2 puffs into the lungs 2 (two) times daily.   cefdinir 300 MG capsule Commonly known as:  OMNICEF Take 1 capsule (300 mg total) by mouth 2 (two) times daily.   FLUTTER Devi Use as directed   glipiZIDE 10 MG tablet Commonly known as:  GLUCOTROL Take 10 mg by mouth 2 (two) times daily before a meal.   hydrochlorothiazide 25 MG tablet Commonly known as:  HYDRODIURIL Take 25 mg by mouth once a day   HYDROcodone-acetaminophen 10-325 MG tablet Commonly known as:  NORCO Take 1 tablet by mouth every 6 (six) hours as needed for moderate pain.   HYDROGEN PEROXIDE EX Place 1 drop into the right ear daily as needed (ear pain).   metFORMIN 1000 MG tablet Commonly known as:  GLUCOPHAGE Take 1,000 mg by mouth two times a day   metoprolol succinate 100 MG 24 hr tablet Commonly known as:  TOPROL-XL Take 100 mg by mouth once a day   nortriptyline 25 MG capsule Commonly known as:  PAMELOR Take 2 capsules (50 mg total) by mouth at bedtime. What  changed:  how much to take   OXYGEN Inhale 3 L into the lungs continuous.   REXULTI 4 MG Tabs Generic drug:  Brexpiprazole Take 4 mg by mouth every morning.   tiotropium 18 MCG inhalation capsule Commonly known as:  SPIRIVA HANDIHALER Place 1 capsule (18 mcg total) into inhaler and inhale daily.   TRINTELLIX 20 MG Tabs tablet Generic drug:  vortioxetine HBr Take 20 mg by mouth in the morning       Allergies  Allergen Reactions  . Contrast Media [Iodinated Diagnostic Agents] Nausea And Vomiting  . Doxycycline Nausea And Vomiting  . Erythromycin Nausea And Vomiting  . Penicillins Itching and Rash    Has patient had a PCN reaction causing immediate rash, facial/tongue/throat swelling, SOB or lightheadedness with hypotension: YES Has patient had a PCN reaction causing severe rash involving mucus membranes or skin necrosis: NO Has patient had a PCN reaction that required hospitalization: NO Has patient had a PCN reaction occurring within the last 10 years: Unknown If all of the above answers are "NO", then may proceed with Cephalosporin use.   Marland Kitchen  Amoxicillin Other (See Comments)    Reaction not recalled by family member and the patient is presently intubated  . Metronidazole Hives and Rash    Consultations:  ICU   Procedures/Studies: Ct Head Wo Contrast  Result Date: 04/18/2017 CLINICAL DATA:  Stroke-like symptoms.  Right facial droop. EXAM: CT HEAD WITHOUT CONTRAST TECHNIQUE: Contiguous axial images were obtained from the base of the skull through the vertex without intravenous contrast. COMPARISON:  06/04/2015 FINDINGS: Brain: No evidence of acute infarction, hemorrhage, extra-axial collection, ventriculomegaly, or mass effect. Generalized cerebral atrophy. Periventricular white matter low attenuation likely secondary to microangiopathy. Vascular: Cerebrovascular atherosclerotic calcifications are noted. Skull: Negative for fracture or focal lesion. Sinuses/Orbits: Visualized  portions of the orbits are unremarkable. Visualized portions of the paranasal sinuses and mastoid air cells are unremarkable. Other: None. IMPRESSION: 1. No acute intracranial pathology. 2. Chronic microvascular disease and cerebral atrophy. Electronically Signed   By: Kathreen Devoid   On: 04/18/2017 12:18   Dg Chest Port 1 View  Result Date: 04/21/2017 CLINICAL DATA:  Aspiration of EXAM: PORTABLE CHEST 1 VIEW COMPARISON:  04/19/2017 FINDINGS: Interval removal of endotracheal tube and NG tube. Bilateral lower lobe airspace opacities are noted, left greater than right, not significantly changed. Small left pleural effusion is decreased. Heart is normal size. IMPRESSION: Bilateral lower lobe airspace opacities, left greater than right, unchanged. Small left effusion, decreased since prior study. Electronically Signed   By: Rolm Baptise M.D.   On: 04/21/2017 08:10   Dg Chest Port 1 View  Result Date: 04/19/2017 CLINICAL DATA:  Respiratory failure. EXAM: PORTABLE CHEST 1 VIEW COMPARISON:  Yesterday FINDINGS: Endotracheal tube tip is 3 cm above the clavicular heads. This is stable from yesterday. An orogastric tube reaches the stomach. New dense opacity at the left base at least partially from pleural fluid. There is also increased patchy density at the right base. No Kerley lines. No pneumothorax. Normal heart size. Large lung volumes. There is history of COPD. These results will be called to the ordering clinician or representative by the Radiologist Assistant, and communication documented in the PACS or zVision Dashboard. IMPRESSION: 1. Unchanged high endotracheal tube, tip 3 cm above the clavicular heads. 2. Increased opacity at the bases which could be infection, aspiration, or atelectasis. New small left pleural effusion. Electronically Signed   By: Monte Fantasia M.D.   On: 04/19/2017 09:33   Dg Chest Portable 1 View  Result Date: 04/18/2017 CLINICAL DATA:  Orogastric and endotracheal tube placement. EXAM:  PORTABLE CHEST 1 VIEW COMPARISON:  04/18/2017 and 01/24/2017. FINDINGS: 1457 hour. Interval intubation. Tip of the endotracheal tube is just below the thoracic inlet. Orogastric tube projects below the diaphragm, tip not visualized. The heart size and mediastinal contours are stable. There is aortic atherosclerosis. There is increased patchy opacity at the left lung base. There is also mildly increased density projecting over the anterior aspect of the right 1st rib, possibly due to adjacent EKG snap. No acute osseous findings. IMPRESSION: 1. Tip of the endotracheal tube is just below the thoracic inlet. Consider advancement by 3-4 cm for more optimal positioning. Orogastric tube projects below the diaphragm. 2. Increased patchy left basilar opacity may reflect atelectasis or aspiration. Electronically Signed   By: Richardean Sale M.D.   On: 04/18/2017 15:18   Dg Chest Port 1 View  Result Date: 04/18/2017 CLINICAL DATA:  Stroke-like symptoms. EXAM: PORTABLE CHEST 1 VIEW COMPARISON:  Chest x-ray dated January 24, 2017. FINDINGS: The heart size and mediastinal contours  are within normal limits. Normal pulmonary vascularity. No focal consolidation, pleural effusion, or pneumothorax. No acute osseous abnormality. IMPRESSION: No active disease. Electronically Signed   By: Titus Dubin M.D.   On: 04/18/2017 11:47    (Echo, Carotid, EGD, Colonoscopy, ERCP)    Subjective:   Discharge Exam: Vitals:   04/21/17 0927 04/21/17 0929  BP: (!) 143/73   Pulse:  95  Resp: 18   Temp:    SpO2: 94%    Vitals:   04/21/17 0802 04/21/17 0805 04/21/17 0927 04/21/17 0929  BP:   (!) 143/73   Pulse:    95  Resp:   18   Temp:      TempSrc:      SpO2: 97% 96% 94%   Weight:        General: Pt is alert, awake, not in acute distress, older than stated age Cardiovascular: RRR, S1/S2 +, no rubs, no gallops Respiratory: CTA bilaterally, no wheezing, scattered rhonchi Abdominal: Soft, NT, ND, bowel sounds  + Extremities: no edema Skin: Noted to have large ulcerating lesion on tip of nose    The results of significant diagnostics from this hospitalization (including imaging, microbiology, ancillary and laboratory) are listed below for reference.     Microbiology: Recent Results (from the past 240 hour(s))  MRSA PCR Screening     Status: None   Collection Time: 04/18/17  7:00 PM  Result Value Ref Range Status   MRSA by PCR NEGATIVE NEGATIVE Final    Comment:        The GeneXpert MRSA Assay (FDA approved for NASAL specimens only), is one component of a comprehensive MRSA colonization surveillance program. It is not intended to diagnose MRSA infection nor to guide or monitor treatment for MRSA infections. Performed at [facility], [place] [address] [place], [place] [address]      Labs: BNP (last 3 results) Recent Labs    01/24/17 1455  BNP 62.2   Basic Metabolic Panel: Recent Labs  Lab 04/18/17 1123 04/19/17 0517 04/21/17 0507  NA 135 140 137  K 3.2* 3.8 2.8*  CL 94* 100* 100*  CO2 28 29 24   GLUCOSE 147* 142* 151*  BUN 12 15 9   CREATININE 0.90 0.99 0.58  CALCIUM 9.6 9.2 8.3*  MG  --  1.5*  --   PHOS  --  2.7  --    Liver Function Tests: Recent Labs  Lab 04/18/17 1123  AST 19  ALT 11*  ALKPHOS 63  BILITOT 0.4  PROT 7.1  ALBUMIN 3.7   No results for input(s): LIPASE, AMYLASE in the last 168 hours. No results for input(s): AMMONIA in the last 168 hours. CBC: Recent Labs  Lab 04/18/17 1123 04/19/17 0517 04/21/17 0507  WBC 9.5 12.4* 10.9*  NEUTROABS 5.9  --   --   HGB 15.6* 13.5 11.1*  HCT 46.9* 43.0 34.0*  MCV 91.8 93.3 91.2  PLT 249 195 162   Cardiac Enzymes: No results for input(s): CKTOTAL, CKMB, CKMBINDEX, TROPONINI in the last 168 hours. BNP: Invalid input(s): POCBNP CBG: Recent Labs  Lab 04/20/17 0738 04/20/17 1110 04/20/17 1534 04/20/17 2203 04/21/17 0751  GLUCAP 128* 113* 200* 232* 136*   D-Dimer No results for input(s):  DDIMER in the last 72 hours. Hgb A1c Recent Labs    04/18/17 1123  HGBA1C 6.5*   Lipid Profile Recent Labs    04/18/17 1418  TRIG 211*   Thyroid function studies No results for input(s): TSH, T4TOTAL,  T3FREE, THYROIDAB in the last 72 hours.  Invalid input(s): FREET3 Anemia work up No results for input(s): VITAMINB12, FOLATE, FERRITIN, TIBC, IRON, RETICCTPCT in the last 72 hours. Urinalysis    Component Value Date/Time   COLORURINE YELLOW 04/18/2017 1132   APPEARANCEUR HAZY (A) 04/18/2017 1132   LABSPEC 1.024 04/18/2017 1132   PHURINE 5.0 04/18/2017 1132   GLUCOSEU NEGATIVE 04/18/2017 1132   HGBUR NEGATIVE 04/18/2017 1132   BILIRUBINUR NEGATIVE 04/18/2017 1132   KETONESUR NEGATIVE 04/18/2017 1132   PROTEINUR NEGATIVE 04/18/2017 1132   UROBILINOGEN 0.2 01/03/2014 1819   NITRITE NEGATIVE 04/18/2017 1132   LEUKOCYTESUR NEGATIVE 04/18/2017 1132   Sepsis Labs Invalid input(s): PROCALCITONIN,  WBC,  LACTICIDVEN Microbiology Recent Results (from the past 240 hour(s))  MRSA PCR Screening     Status: None   Collection Time: 04/18/17  7:00 PM  Result Value Ref Range Status   MRSA by PCR NEGATIVE NEGATIVE Final    Comment:        The GeneXpert MRSA Assay (FDA approved for NASAL specimens only), is one component of a comprehensive MRSA colonization surveillance program. It is not intended to diagnose MRSA infection nor to guide or monitor treatment for MRSA infections. Performed at [facility], [place] [address] [place], [place] [address]      Time coordinating discharge: Over 30 minutes  SIGNED:   Cristy Folks, MD  [facility] 04/21/2017, 10:22 AM   If 7PM-7AM, please contact night-coverage [email or web address] Password [email or web address]

## 2017-04-23 NOTE — Progress Notes (Deleted)
Established Intermittent Claudication   History of Present Illness   [name] is a 62 y.o. (07/04/1955) female who presents with chief complaint: ***.  Patient had aortogram and left leg runoff on 04/05/17.  This demonstrated mid-segment SFA occlusion with extensive profunda femoral artery collateralization reconstituting AK target.  The patient was admitted 04/18/17 with AMS and related to opiates and benzodiazepine use.  During that admission, she had a possible aspiration pneumonia.    Pt was referred to cardiology:  ***  The patient's symptoms have *** progressed.  The patient's symptoms are: ***.  The patient's treatment regimen currently included: maximal medical management and ***walking plan.  The patient's PMH, PSH, and SH, and FamHx are unchanged from ***.  Current Outpatient Medications  Medication Sig Dispense Refill  . albuterol (PROVENTIL HFA;VENTOLIN HFA) 108 (90 BASE) MCG/ACT inhaler Inhale 2 puffs into the lungs 2 (two) times daily as needed for wheezing.    Marland Kitchen albuterol (PROVENTIL) (2.5 MG/3ML) 0.083% nebulizer solution Take 3 mLs (2.5 mg total) by nebulization every 4 (four) hours as needed for wheezing or shortness of breath. 75 mL 0  . alprazolam (XANAX) 2 MG tablet Take 0.5 tablets (1 mg total) by mouth at bedtime as needed for sleep or anxiety (Can take up to 3 thimes daily prn). Patient takes 1/2 tablet by mouth four times a day (Patient taking differently: Take 1-2 mg by mouth See admin instructions. 1 mg by mouth in the morning then 1 mg at noon(time) then 1 mg at 4 PM then 2 mg at bedtime) 30 tablet 0  . apixaban (ELIQUIS) 5 MG TABS tablet Take 1 tablet (5 mg total) by mouth 2 (two) times daily. 60 tablet 1  . atorvastatin (LIPITOR) 20 MG tablet Take 20 mg by mouth at bedtime  3  . budesonide-formoterol (SYMBICORT) 160-4.5 MCG/ACT inhaler Inhale 2 puffs into the lungs 2 (two) times daily. 1 Inhaler 0  . cefdinir (OMNICEF) 300 MG capsule Take 1 capsule (300 mg  total) by mouth 2 (two) times daily. 6 capsule 0  . glipiZIDE (GLUCOTROL) 10 MG tablet Take 10 mg by mouth 2 (two) times daily before a meal.     . hydrochlorothiazide (HYDRODIURIL) 25 MG tablet Take 25 mg by mouth once a day  3  . HYDROcodone-acetaminophen (NORCO) 10-325 MG tablet Take 1 tablet by mouth every 6 (six) hours as needed for moderate pain.   0  . HYDROGEN PEROXIDE EX Place 1 drop into the right ear daily as needed (ear pain).    . metFORMIN (GLUCOPHAGE) 1000 MG tablet Take 1,000 mg by mouth two times a day  3  . metoprolol succinate (TOPROL-XL) 100 MG 24 hr tablet Take 100 mg by mouth once a day  2  . nortriptyline (PAMELOR) 25 MG capsule Take 2 capsules (50 mg total) by mouth at bedtime. (Patient taking differently: Take 75 mg by mouth at bedtime. )  0  . OXYGEN Inhale 3 L into the lungs continuous.    Marland Kitchen Respiratory Therapy Supplies (FLUTTER) DEVI Use as directed 1 each 0  . REXULTI 4 MG TABS Take 4 mg by mouth every morning.  4  . tiotropium (SPIRIVA HANDIHALER) 18 MCG inhalation capsule Place 1 capsule (18 mcg total) into inhaler and inhale daily. 30 capsule 0  . TRINTELLIX 20 MG TABS Take 20 mg by mouth in the morning  5   No current facility-administered medications for this visit.     On ROS today: ***, ***.  Physical Examination  ***There were no vitals filed for this visit. ***There is no height or weight on file to calculate BMI.  General {LOC:19197::"Somulent","Alert"}, {Orientation:19197::"Confused","O x 3"}, {Weight:19197::"Obese","Cachectic","WD"}, {General state of health:19197::"Ill appearing","Elderly","NAD"}  Pulmonary {Chest wall:19197::"Asx chest movement","Sym exp"}, {Air movt:19197::"Decreased *** air movt","good B air movt"}, {BS:19197::"rales on ***","rhonchi on ***","wheezing on ***","CTA B"}  Cardiac {Rhythm:19197::"Irregularly, irregular rate and rhythm","RRR, Nl S1, S2"}, {Murmur:19197::"Murmur present: ***","no Murmurs"}, {Rubs:19197::"Rub present:  ***","No rubs"}, {Gallop:19197::"Gallop present: ***","No S3,S4"}  Vascular Vessel Right Left  Radial {Palpable:19197::"Not palpable","Faintly palpable","Palpable"} {Palpable:19197::"Not palpable","Faintly palpable","Palpable"}  Brachial {Palpable:19197::"Not palpable","Faintly palpable","Palpable"} {Palpable:19197::"Not palpable","Faintly palpable","Palpable"}  Carotid Palpable, {Bruit:19197::"Bruit present","No Bruit"} Palpable, {Bruit:19197::"Bruit present","No Bruit"}  Aorta Not palpable N/A  Femoral {Palpable:19197::"Not palpable","Faintly palpable","Palpable"} {Palpable:19197::"Not palpable","Faintly palpable","Palpable"}  Popliteal {Palpable:19197::"Prominently palpable","Not palpable"} {Palpable:19197::"Prominently palpable","Not palpable"}  PT {Palpable:19197::"Not palpable","Faintly palpable","Palpable"} {Palpable:19197::"Not palpable","Faintly palpable","Palpable"}  DP {Palpable:19197::"Not palpable","Faintly palpable","Palpable"} {Palpable:19197::"Not palpable","Faintly palpable","Palpable"}    Gastro- intestinal soft, {Distension:19197::"distended","non-distended"}, {TTP:19197::"TTP in *** quadrant","appropriate tenderness to palpation","non-tender to palpation"}, {Guarding:19197::"Guarding and rebound in *** quadrant","No guarding or rebound"}, {HSM:19197::"HSM present","no HSM"}, {Masses:19197::"Mass present: ***","no masses"}, {Flank:19197::"CVAT on ***","Flank bruit present on ***","no CVAT B"}, {AAA:19197::"Palpable prominent aortic pulse","No palpable prominent aortic pulse"}, {Surgical incision:19197::"Surg incisions: ***","Surgical incisions well healed"," "}  Musculo- skeletal M/S 5/5 throughout {MS:19197::"except ***"," "}, Extremities without ischemic changes {MS:19197::"except ***"," "}, {Edema:19197::"Pitting edema present: ***","Non-pitting edema present: ***","No edema present"}, {Varicosities:19197::"Varicosities present: ***","No visible varicosities "},  {LDS:19197::"Lipodermatosclerosis present: ***","No Lipodermatosclerosis present"}  Neurologic Pain and light touch intact in extremities{CN:19197::" except for decreased sensation in ***"," "}, Motor exam as listed above    Non-Invasive Vascular imaging   ABI (***)  R:   ABI: *** (***),   PT: {Signals:19197::"none","mono","bi","tri"}  DP: {Signals:19197::"none","mono","bi","tri"}  TBI:  ***  L:   ABI: *** (***),   PT: {Signals:19197::"none","mono","bi","tri"}  DP: {Signals:19197::"none","mono","bi","tri"}  TBI: ***  Aortoiliac Duplex (***)  Ao: *** c/s  R iliac: *** c/s  L iliac: *** c/s   Medical Decision Making   [name] is a 62 y.o. female who presents with:  {Side:17767} leg intermittent claudication without evidence of critical limb ischemia.   Based on the patient's vascular studies and examination, I have offered the patient: ***.  I discussed in depth with the patient the nature of atherosclerosis, and emphasized the importance of maximal medical management including strict control of blood pressure, blood glucose, and lipid levels, antiplatelet agents, obtaining regular exercise, and cessation of smoking.    The patient is aware that without maximal medical management the underlying atherosclerotic disease process will progress, limiting the benefit of any interventions. The patient is currently {Statin:19197::"not on on statin as not medically indicated.","not on a statin due to reported allergy.","not on a statin. Patient will be started on Lipitor 10 mg PO daily, to be titrated and managed by their primary physician.","on a statin: Zocor.","on a statin: Lipitor.","on a statin: Pravachol.","on a statin: Crestor.","on a statin: ***."}  The patient is currently {Antiplatelet:19197::"not on an anti-platelet due to allergy.","not on an anti-platelet due to bleeding risks related to use of an anticoagulant.","not on an anti-platelet. Patient will be  started on ASA 81 mg PO daily","on an anti-platelet: ASA.","on an anti-platelet: Plavix.","on an anti-platelet: ASA and Plavix.","on an anti-platelet: ***."}  Thank you for allowing [place] to participate in this patient's care.   Adele Barthel, MD, FACS [facility] Office: [phone] Pager: [phone]

## 2017-04-25 ENCOUNTER — Ambulatory Visit: Payer: Medicare Other | Admitting: Vascular Surgery

## 2017-05-01 NOTE — Progress Notes (Addendum)
Postoperative Visit (Angio)   History of Present Illness   [name] is a 62 y.o. female who presents cc:  Left leg pain.  Prior procedure include: 1.  Diagnostic Ao with LRo (04/05/16): SFA occlusion with rapid PFA reconstitution of AK pop  The patient was seen in ED on 04/18/17 with acute respiratory distress requiring intubation for oversedation.  Concerns for aspiration PNA were also raised.  She had new onset afib during this admission.  The patient was started on Apixaban.  The patient has NOT been seen by Cardiology.  The patient's has no wounds.  The patient notes mod-severe pain in left leg that has some rest pain elements.  The pt is not able to complete their activities of daily living.    Past Medical History, Past Surgical History, Social History, Family History, Medications, Allergies, and Review of Systems are unchanged from previous evaluation on 04/05/16.  Current Outpatient Medications  Medication Sig Dispense Refill  . albuterol (PROVENTIL HFA;VENTOLIN HFA) 108 (90 BASE) MCG/ACT inhaler Inhale 2 puffs into the lungs 2 (two) times daily as needed for wheezing.    Marland Kitchen albuterol (PROVENTIL) (2.5 MG/3ML) 0.083% nebulizer solution Take 3 mLs (2.5 mg total) by nebulization every 4 (four) hours as needed for wheezing or shortness of breath. 75 mL 0  . alprazolam (XANAX) 2 MG tablet Take 0.5 tablets (1 mg total) by mouth at bedtime as needed for sleep or anxiety (Can take up to 3 thimes daily prn). Patient takes 1/2 tablet by mouth four times a day (Patient taking differently: Take 1-2 mg by mouth See admin instructions. 1 mg by mouth in the morning then 1 mg at noon(time) then 1 mg at 4 PM then 2 mg at bedtime) 30 tablet 0  . apixaban (ELIQUIS) 5 MG TABS tablet Take 1 tablet (5 mg total) by mouth 2 (two) times daily. 60 tablet 1  . atorvastatin (LIPITOR) 20 MG tablet Take 20 mg by mouth at bedtime  3  . budesonide-formoterol (SYMBICORT) 160-4.5 MCG/ACT inhaler Inhale 2  puffs into the lungs 2 (two) times daily. 1 Inhaler 0  . cefdinir (OMNICEF) 300 MG capsule Take 1 capsule (300 mg total) by mouth 2 (two) times daily. 6 capsule 0  . glipiZIDE (GLUCOTROL) 10 MG tablet Take 10 mg by mouth 2 (two) times daily before a meal.     . hydrochlorothiazide (HYDRODIURIL) 25 MG tablet Take 25 mg by mouth once a day  3  . HYDROcodone-acetaminophen (NORCO) 10-325 MG tablet Take 1 tablet by mouth every 6 (six) hours as needed for moderate pain.   0  . HYDROGEN PEROXIDE EX Place 1 drop into the right ear daily as needed (ear pain).    . metFORMIN (GLUCOPHAGE) 1000 MG tablet Take 1,000 mg by mouth two times a day  3  . metoprolol succinate (TOPROL-XL) 100 MG 24 hr tablet Take 100 mg by mouth once a day  2  . nortriptyline (PAMELOR) 25 MG capsule Take 2 capsules (50 mg total) by mouth at bedtime. (Patient taking differently: Take 75 mg by mouth at bedtime. )  0  . OXYGEN Inhale 3 L into the lungs continuous.    Marland Kitchen Respiratory Therapy Supplies (FLUTTER) DEVI Use as directed 1 each 0  . REXULTI 4 MG TABS Take 4 mg by mouth every morning.  4  . tiotropium (SPIRIVA HANDIHALER) 18 MCG inhalation capsule Place 1 capsule (18 mcg total) into inhaler and inhale daily. 30 capsule 0  .  TRINTELLIX 20 MG TABS Take 20 mg by mouth in the morning  5   No current facility-administered medications for this visit.     ROS: pain as described above, continued smoking   For VQI Use Only   PRE-ADM LIVING: Home  AMB STATUS: Ambulatory   Physical Examination   Vitals:   05/07/17 0913 05/07/17 0915  BP: (!) 150/103 (!) 149/105  Pulse: 78 78  Resp: 16   Temp: 98.1 F (36.7 C)   TempSrc: Oral   SpO2: 95%   Weight: 131 lb (59.4 kg)   Height: 5\' 5"  (1.651 m)    Body mass index is 21.8 kg/m.  General Alert, O x 3, WD, NAD  Pulmonary Sym exp, good B air movt, CTA B  Cardiac RRR, Nl S1, S2, no Murmurs, No rubs, No S3,S4  Vascular Vessel Right Left  Radial Palpable Palpable  Brachial  Palpable Palpable  Carotid Palpable, No Bruit Palpable, No Bruit  Aorta Not palpable N/A  Femoral Palpable Palpable  Popliteal Not palpable Not palpable  PT Not palpable Not palpable  DP Palpable Not palpable    Gastrointestinal soft, non-distended, non-tender to palpation, No guarding or rebound, no HSM, no masses, no CVAT B, No palpable prominent aortic pulse,    Musculoskeletal M/S 5/5 throughout  , Extremities without ischemic changes  , No edema present,  Neurologic  Pain and light touch intact in extremities , Motor exam as listed above    Medical Decision Making   [name] is a 62 y.o. female who presents L SFA occlusion with rapid and extensive colalteralization, L leg pain  Pt's sx do not match the angiographic findings.  Pt has rapid reconstitution of her AK pop, so she should not have the severity of pain she has. I issued the patient #20 only with no refills: ultram 50 mg 1 po q6h prn pain.   Given her recent admission for respiratory distress related to medications, I decline to give any more potent pain rx. Based on his angiographic findings, this patient needs: L CFA to AK vs BK pop BPG. Will get B GSV mapping today. Pt needs ASAP Cardiology evaluation given prior CHF diagnosis to clear Anesthesia. Once this is completed, will schedule the patient for surgery for R CFA to BK pop bypass. I discussed in depth with the patient the nature of atherosclerosis, and emphasized the importance of maximal medical management including strict control of blood pressure, blood glucose, and lipid levels, obtaining regular exercise, and cessation of smoking.  The patient is aware that without maximal medical management the underlying atherosclerotic disease process will progress, limiting the benefit of any interventions. The patient is currently on a statin: Lipitor.  The patient is currently not on anti-platelet due to reported history of COPD exacerbation.  Thank you for allowing  [place] to participate in this patient's care.   Adele Barthel, MD, FACS [facility] [place] Office: [phone] Pager: [phone]  Addendum  B GSV mapping completed: no adequate B GSV for conduit.  Adele Barthel, MD, FACS [facility] [place] Office: [phone] Pager: [phone]  05/07/2017, 12:28 PM

## 2017-05-01 NOTE — H&P (View-Only) (Signed)
Postoperative Visit (Angio)   History of Present Illness   [name] is a 62 y.o. female who presents cc:  Left leg pain.  Prior procedure include: 1.  Diagnostic Ao with LRo (04/05/16): SFA occlusion with rapid PFA reconstitution of AK pop  The patient was seen in ED on 04/18/17 with acute respiratory distress requiring intubation for oversedation.  Concerns for aspiration PNA were also raised.  She had new onset afib during this admission.  The patient was started on Apixaban.  The patient has NOT been seen by Cardiology.  The patient's has no wounds.  The patient notes mod-severe pain in left leg that has some rest pain elements.  The pt is not able to complete their activities of daily living.    Past Medical History, Past Surgical History, Social History, Family History, Medications, Allergies, and Review of Systems are unchanged from previous evaluation on 04/05/16.  Current Outpatient Medications  Medication Sig Dispense Refill  . albuterol (PROVENTIL HFA;VENTOLIN HFA) 108 (90 BASE) MCG/ACT inhaler Inhale 2 puffs into the lungs 2 (two) times daily as needed for wheezing.    Marland Kitchen albuterol (PROVENTIL) (2.5 MG/3ML) 0.083% nebulizer solution Take 3 mLs (2.5 mg total) by nebulization every 4 (four) hours as needed for wheezing or shortness of breath. 75 mL 0  . alprazolam (XANAX) 2 MG tablet Take 0.5 tablets (1 mg total) by mouth at bedtime as needed for sleep or anxiety (Can take up to 3 thimes daily prn). Patient takes 1/2 tablet by mouth four times a day (Patient taking differently: Take 1-2 mg by mouth See admin instructions. 1 mg by mouth in the morning then 1 mg at noon(time) then 1 mg at 4 PM then 2 mg at bedtime) 30 tablet 0  . apixaban (ELIQUIS) 5 MG TABS tablet Take 1 tablet (5 mg total) by mouth 2 (two) times daily. 60 tablet 1  . atorvastatin (LIPITOR) 20 MG tablet Take 20 mg by mouth at bedtime  3  . budesonide-formoterol (SYMBICORT) 160-4.5 MCG/ACT inhaler Inhale 2  puffs into the lungs 2 (two) times daily. 1 Inhaler 0  . cefdinir (OMNICEF) 300 MG capsule Take 1 capsule (300 mg total) by mouth 2 (two) times daily. 6 capsule 0  . glipiZIDE (GLUCOTROL) 10 MG tablet Take 10 mg by mouth 2 (two) times daily before a meal.     . hydrochlorothiazide (HYDRODIURIL) 25 MG tablet Take 25 mg by mouth once a day  3  . HYDROcodone-acetaminophen (NORCO) 10-325 MG tablet Take 1 tablet by mouth every 6 (six) hours as needed for moderate pain.   0  . HYDROGEN PEROXIDE EX Place 1 drop into the right ear daily as needed (ear pain).    . metFORMIN (GLUCOPHAGE) 1000 MG tablet Take 1,000 mg by mouth two times a day  3  . metoprolol succinate (TOPROL-XL) 100 MG 24 hr tablet Take 100 mg by mouth once a day  2  . nortriptyline (PAMELOR) 25 MG capsule Take 2 capsules (50 mg total) by mouth at bedtime. (Patient taking differently: Take 75 mg by mouth at bedtime. )  0  . OXYGEN Inhale 3 L into the lungs continuous.    Marland Kitchen Respiratory Therapy Supplies (FLUTTER) DEVI Use as directed 1 each 0  . REXULTI 4 MG TABS Take 4 mg by mouth every morning.  4  . tiotropium (SPIRIVA HANDIHALER) 18 MCG inhalation capsule Place 1 capsule (18 mcg total) into inhaler and inhale daily. 30 capsule 0  .  TRINTELLIX 20 MG TABS Take 20 mg by mouth in the morning  5   No current facility-administered medications for this visit.     ROS: pain as described above, continued smoking   For VQI Use Only   PRE-ADM LIVING: Home  AMB STATUS: Ambulatory   Physical Examination   Vitals:   05/07/17 0913 05/07/17 0915  BP: (!) 150/103 (!) 149/105  Pulse: 78 78  Resp: 16   Temp: 98.1 F (36.7 C)   TempSrc: Oral   SpO2: 95%   Weight: 131 lb (59.4 kg)   Height: 5\' 5"  (1.651 m)    Body mass index is 21.8 kg/m.  General Alert, O x 3, WD, NAD  Pulmonary Sym exp, good B air movt, CTA B  Cardiac RRR, Nl S1, S2, no Murmurs, No rubs, No S3,S4  Vascular Vessel Right Left  Radial Palpable Palpable  Brachial  Palpable Palpable  Carotid Palpable, No Bruit Palpable, No Bruit  Aorta Not palpable N/A  Femoral Palpable Palpable  Popliteal Not palpable Not palpable  PT Not palpable Not palpable  DP Palpable Not palpable    Gastrointestinal soft, non-distended, non-tender to palpation, No guarding or rebound, no HSM, no masses, no CVAT B, No palpable prominent aortic pulse,    Musculoskeletal M/S 5/5 throughout  , Extremities without ischemic changes  , No edema present,  Neurologic  Pain and light touch intact in extremities , Motor exam as listed above    Medical Decision Making   PAUL TORPEY is a 62 y.o. female who presents L SFA occlusion with rapid and extensive colalteralization, L leg pain  Pt's sx do not match the angiographic findings.  Pt has rapid reconstitution of her AK pop, so she should not have the severity of pain she has. I issued the patient #20 only with no refills: ultram 50 mg 1 po q6h prn pain.   Given her recent admission for respiratory distress related to medications, I decline to give any more potent pain rx. Based on his angiographic findings, this patient needs: L CFA to AK vs BK pop BPG. Will get B GSV mapping today. Pt needs ASAP Cardiology evaluation given prior CHF diagnosis to clear Anesthesia. Once this is completed, will schedule the patient for surgery for R CFA to BK pop bypass. I discussed in depth with the patient the nature of atherosclerosis, and emphasized the importance of maximal medical management including strict control of blood pressure, blood glucose, and lipid levels, obtaining regular exercise, and cessation of smoking.  The patient is aware that without maximal medical management the underlying atherosclerotic disease process will progress, limiting the benefit of any interventions. The patient is currently on a statin: Lipitor.  The patient is currently not on anti-platelet due to reported history of COPD exacerbation.  Thank you for allowing  [place] to participate in this patient's care.   Adele Barthel, MD, FACS [facility] [place] Office: [phone] Pager: [phone]  Addendum  B GSV mapping completed: no adequate B GSV for conduit.  Adele Barthel, MD, FACS [facility] [place] Office: [phone] Pager: [phone]  05/07/2017, 12:28 PM

## 2017-05-02 ENCOUNTER — Ambulatory Visit: Payer: Medicare Other | Admitting: Vascular Surgery

## 2017-05-07 ENCOUNTER — Other Ambulatory Visit: Payer: Self-pay

## 2017-05-07 ENCOUNTER — Encounter: Payer: Self-pay | Admitting: Vascular Surgery

## 2017-05-07 ENCOUNTER — Ambulatory Visit (HOSPITAL_COMMUNITY)
Admission: RE | Admit: 2017-05-07 | Discharge: 2017-05-07 | Disposition: A | Discharge status: Home / Self Care | Payer: Medicare Other | Source: Ambulatory Visit | Attending: Vascular Surgery | Admitting: Vascular Surgery

## 2017-05-07 ENCOUNTER — Ambulatory Visit (INDEPENDENT_AMBULATORY_CARE_PROVIDER_SITE_OTHER): Payer: Medicare Other | Admitting: Vascular Surgery

## 2017-05-07 VITALS — BP 149/105 | HR 78 | Temp 98.1°F | Resp 16 | Ht 65.0 in | Wt 131.0 lb

## 2017-05-07 DIAGNOSIS — Z01818 Encounter for other preprocedural examination: Secondary | ICD-10-CM | POA: Insufficient documentation

## 2017-05-07 DIAGNOSIS — I70212 Atherosclerosis of native arteries of extremities with intermittent claudication, left leg: Secondary | ICD-10-CM | POA: Diagnosis present

## 2017-05-07 DIAGNOSIS — I70219 Atherosclerosis of native arteries of extremities with intermittent claudication, unspecified extremity: Secondary | ICD-10-CM

## 2017-05-07 MED ORDER — TRAMADOL HCL 50 MG PO TABS: 50.0000 mg | ORAL_TABLET | Freq: Four times a day (QID) | ORAL | 0 refills | Status: DC | PRN

## 2017-05-08 ENCOUNTER — Other Ambulatory Visit: Payer: Self-pay | Admitting: *Deleted

## 2017-05-13 NOTE — Progress Notes (Signed)
Cardiology Office Note   Date:  05/15/2017   ID:  [name], DOB 26-Dec-1955, MRN [id number]  PCP:  Elwyn Reach, MD  Cardiologist:   No primary care provider on file. Referring:  Conrad Mansfield, MD  Chief Complaint  Patient presents with  . Pre-op Exam      History of Present Illness: [name] is a 62 y.o. female who is referred by Dr. Bridgett Larsson for preop evaluation prior to left fempop bypass.   The patient had a negative myoview in 2015.  Echo in 2019 had a normal EF.  This was done recently when she was in the hospital for respiratory failure probably secondary to medication overdose.  I did review these records for this visit.  Of note there were no elevated cardiac enzymes noted or ischemic changes although she did develop atrial fibrillation.  In reviewing these records she apparently went back into sinus rhythm on her own although I cannot identify exactly when.  She was sent home on Eliquis but she developed a nosebleed so she stopped this a couple of days ago.  She is very sedentary.  She gets around her house with a walker.  She is been in very significant left leg pain from the ischemia.  She still smokes a few cigarettes daily.  With her limited activity she denies any chest pressure, neck or arm discomfort.  She has no palpitations, presyncope or syncope.  She is had no PND or orthopnea.    Past medical history of COPD, asthma, GERD, depression, anxiety, hypertension, ADHD, hyperlipidemia, diabetes mellitus, tobacco abuse, who presented with productive cough and shortness of breath, and chest pain.  Patient reports that she has been having cough, shortness of breath and chest pain in the past 1 to 2 weeks. She has greenish sputum production. Her chest pain is located is in the right side of the chest, constant, nonradiating. Her chest pain is pleuritic and aggravated by deep breath. She also has some mild nausea, but no vomiting, abdominal pain or diarrhea. No  symptoms for UTI. She was evaluated in urgent care, and was suggested to come in the emergency room for further evaluation and treatment. Of note, patient was seen in ED on 11/8 because of chest pain. She was given referral to cardiologist.  She denies fever, chills, abdominal pain, diarrhea, dysuria, urgency, frequency, hematuria, skin rashes or leg swelling.  Work up in the ED demonstrates negative troponin; proBNP 9.9; no leukocytosis; possible bronchitis on chest x-ray. She is admitted to inpatient for further evaluation and treatment.   Past Medical History:  Diagnosis Date  . ADD (attention deficit disorder with hyperactivity)   . Anxiety   . Arthritis    "neck" (06/16/2015)  . Asthma   . Basal cell carcinoma, face   . Cervical cancer ([place])   . Chronic bronchitis ([place])   . 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 COPD (chronic obstructive pulmonary disease) ([place])   . Depression   . GERD (gastroesophageal reflux disease)   . Hypertension   . On home oxygen therapy    "2L at bedtime" (06/16/2015)  . Squamous cell carcinoma, face   . Thyroid cyst   . Type II diabetes mellitus ([place])     Past Surgical History:  Procedure Laterality Date  . ABDOMINAL AORTOGRAM N/A 04/05/2017   Procedure: ABDOMINAL AORTOGRAM;  Surgeon: Conrad [place], MD;  Location: [facility];  Service: Vascular;  Laterality: N/A;  . ABDOMINAL HYSTERECTOMY    .  ANTERIOR CERVICAL DECOMP/DISCECTOMY FUSION    . AORTOGRAM Left 04/05/2017   Procedure: Angiogram with left leg runoff;  Surgeon: Conrad [place], MD;  Location: [facility];  Service: Vascular;  Laterality: Left;  . BACK SURGERY    . BASAL CELL CARCINOMA EXCISION     "face"  . COLECTOMY    . EXCISIONAL HEMORRHOIDECTOMY    . HERNIA REPAIR  X 3   incisional/ventral hernia repair "in my stomach where the womb is"  . LAPAROSCOPIC CHOLECYSTECTOMY    . OOPHORECTOMY  2006   cervical cancer  . RECTOVAGINAL FISTULA CLOSURE  2012  . SQUAMOUS CELL CARCINOMA EXCISION    . TUBAL  LIGATION    . VULVA SURGERY  2006   Cancer in situ  . WRIST SURGERY Right X 1   "accidentially cut it"  . WRIST SURGERY Left    "don't remember why"     Current Outpatient Medications  Medication Sig Dispense Refill  . albuterol (PROVENTIL HFA;VENTOLIN HFA) 108 (90 BASE) MCG/ACT inhaler Inhale 2 puffs into the lungs 2 (two) times daily as needed for wheezing.    Marland Kitchen albuterol (PROVENTIL) (2.5 MG/3ML) 0.083% nebulizer solution Take 3 mLs (2.5 mg total) by nebulization every 4 (four) hours as needed for wheezing or shortness of breath. 75 mL 0  . alprazolam (XANAX) 2 MG tablet Take 0.5 tablets (1 mg total) by mouth at bedtime as needed for sleep or anxiety (Can take up to 3 thimes daily prn). Patient takes 1/2 tablet by mouth four times a day (Patient taking differently: Take 1-2 mg by mouth See admin instructions. 1 mg by mouth in the morning then 1 mg at noon(time) then 1 mg at 4 PM then 2 mg at bedtime) 30 tablet 0  . apixaban (ELIQUIS) 5 MG TABS tablet Take 1 tablet (5 mg total) by mouth 2 (two) times daily. 60 tablet 1  . atorvastatin (LIPITOR) 20 MG tablet Take 20 mg by mouth at bedtime  3  . budesonide-formoterol (SYMBICORT) 160-4.5 MCG/ACT inhaler Inhale 2 puffs into the lungs 2 (two) times daily. 1 Inhaler 0  . cefdinir (OMNICEF) 300 MG capsule Take 1 capsule (300 mg total) by mouth 2 (two) times daily. 6 capsule 0  . glipiZIDE (GLUCOTROL) 10 MG tablet Take 10 mg by mouth 2 (two) times daily before a meal.     . hydrochlorothiazide (HYDRODIURIL) 25 MG tablet Take 25 mg by mouth once a day  3  . HYDROcodone-acetaminophen (NORCO) 10-325 MG tablet Take 1 tablet by mouth every 6 (six) hours as needed for moderate pain.   0  . HYDROGEN PEROXIDE EX Place 1 drop into the right ear daily as needed (ear pain).    . metFORMIN (GLUCOPHAGE) 1000 MG tablet Take 1,000 mg by mouth two times a day  3  . metoprolol succinate (TOPROL-XL) 100 MG 24 hr tablet Take 100 mg by mouth once a day  2  .  nortriptyline (PAMELOR) 25 MG capsule Take 2 capsules (50 mg total) by mouth at bedtime. (Patient taking differently: Take 75 mg by mouth at bedtime. )  0  . OXYGEN Inhale 3 L into the lungs continuous.    Marland Kitchen Respiratory Therapy Supplies (FLUTTER) DEVI Use as directed 1 each 0  . REXULTI 4 MG TABS Take 4 mg by mouth every morning.  4  . tiotropium (SPIRIVA HANDIHALER) 18 MCG inhalation capsule Place 1 capsule (18 mcg total) into inhaler and inhale daily. 30 capsule 0  . traMADol (  ULTRAM) 50 MG tablet Take 1 tablet (50 mg total) by mouth every 6 (six) hours as needed. 20 tablet 0  . TRINTELLIX 20 MG TABS Take 20 mg by mouth in the morning  5   No current facility-administered medications for this visit.     Allergies:   Contrast media [iodinated diagnostic agents]; Doxycycline; Erythromycin; Penicillins; Amoxicillin; and Metronidazole    Social History:  The patient  reports that she has been smoking cigarettes.  She has a 47.00 pack-year smoking history. She has never used smokeless tobacco. She reports that she does not drink alcohol or use drugs.   Family History:  The patient's family history includes Asthma in her mother; COPD in her father and mother; Cancer in her father and paternal aunt.    ROS:  Please see the history of present illness.   Otherwise, review of systems are positive for none.   All other systems are reviewed and negative.    PHYSICAL EXAM: VS:  BP 122/74   Pulse (!) 101   Ht 5\' 5"  (1.651 m)   Wt 136 lb (61.7 kg)   BMI 22.63 kg/m  , BMI Body mass index is 22.63 kg/m. GENERAL: Chronically ill-appearing and looking much older than stated age. HEENT:  Pupils equal round and reactive, fundi not visualized, oral mucosa unremarkable NECK:  No jugular venous distention, waveform within normal limits, carotid upstroke brisk and symmetric, no bruits, no thyromegaly LYMPHATICS:  No cervical, inguinal adenopathy LUNGS:  Clear to auscultation bilaterally BACK:  No CVA  tenderness CHEST:  Unremarkable HEART:  PMI not displaced or sustained,S1 and S2 within normal limits, no S3, no S4, no clicks, no rubs, no murmurs ABD:  Flat, positive bowel sounds normal in frequency in pitch, no bruits, no rebound, no guarding, no midline pulsatile mass, no hepatomegaly, no splenomegaly EXT:  2 plus pulses upper and right DP/PT, absent left DP/PT, no edema, no cyanosis no clubbing SKIN:  No rashes no nodules NEURO:  Cranial nerves II through XII grossly intact, motor grossly intact throughout PSYCH:  Cognitively intact, oriented to person place and time    EKG:  EKG is ordered today. The ekg ordered today demonstrates sinus tachycardia, rate 101, axis within normal limits, intervals within normal limits, no acute ST-T wave changes.   Recent Labs: 01/24/2017: B Natriuretic Peptide 81.0 01/26/2017: TSH 0.080 04/18/2017: ALT 11 04/19/2017: Magnesium 1.5 04/21/2017: BUN 9; Creatinine, Ser 0.58; Hemoglobin 11.1; Platelets 162; Potassium 2.8; Sodium 137    Lipid Panel    Component Value Date/Time   CHOL 174 08/09/2016 0212   TRIG 211 (H) 04/18/2017 1418   HDL 38 (L) 08/09/2016 0212   CHOLHDL 4.6 08/09/2016 0212   VLDL 27 08/09/2016 0212   LDLCALC 109 (H) 08/09/2016 0212      Wt Readings from Last 3 Encounters:  05/15/17 136 lb (61.7 kg)  05/07/17 131 lb (59.4 kg)  04/21/17 147 lb 14.9 oz (67.1 kg)      Other studies Reviewed: Additional studies/ records that were reviewed today include: Hospital records. Review of the above records demonstrates:  Please see elsewhere in the note.     ASSESSMENT AND PLAN:  PREOP:   I discussed with the patient that by the modified Truman Hayward criteria she has a moderate risk for cardiovascular complications with the planned surgery.  That would be 6-7% risk of a major cardiovascular event.  However, the patient is in very severe pain.  She absolutely accepts this risk as does her  son.  I think that it would be not reasonable to perform  further risk stratification prior to the procedure.  I do think she has a higher risk of arrhythmia such as atrial fibrillation.  However, we would be available to help with any cardiovascular issues at the time of her procedure.  PULMONARY HTN: This was noted on a 2017 EKG but I do not see it noted on the most recent.  ATRIAL FIB: She is no longer in atrial fibrillation.  She stopped the Eliquis because of nosebleeds.  For now she will hold off on taking anticoagulation.  No change in medications.   Current medicines are reviewed at length with the patient today.  The patient does not have concerns regarding medicines.  The following changes have been made:  no change  Labs/ tests ordered today include: None  Orders Placed This Encounter  Procedures  . EKG 12-Lead     Disposition:   FU with me as needed.      Signed, Minus Breeding, MD  05/15/2017 8:56 AM    [facility]

## 2017-05-14 ENCOUNTER — Telehealth: Payer: Self-pay | Admitting: *Deleted

## 2017-05-14 NOTE — Telephone Encounter (Signed)
Spoke with patient and significant other [name]. Instructed to be at [facility] admitting department at 7:30 am 05/22/17 for surgery. To hold Eliquis for 3 days prior to surgery. NPO past MN night before and to expect a call and follow the directions from the hospital pre-admission department about this surgery. Reminded patient of Cardiology appt. Tuesday and to call that office for directions. [name] states she is not taking Eliquis every day due to nose bleeds. Instructed to follow up with doctor about this. Hold this medication 3 days prior to surgery. [name] verbalized understanding.

## 2017-05-15 ENCOUNTER — Ambulatory Visit (INDEPENDENT_AMBULATORY_CARE_PROVIDER_SITE_OTHER): Payer: Medicare Other | Admitting: Cardiology

## 2017-05-15 ENCOUNTER — Encounter: Payer: Self-pay | Admitting: Cardiology

## 2017-05-15 VITALS — BP 122/74 | HR 101 | Ht 65.0 in | Wt 136.0 lb

## 2017-05-15 DIAGNOSIS — I480 Paroxysmal atrial fibrillation: Secondary | ICD-10-CM | POA: Insufficient documentation

## 2017-05-15 DIAGNOSIS — I70219 Atherosclerosis of native arteries of extremities with intermittent claudication, unspecified extremity: Secondary | ICD-10-CM

## 2017-05-15 DIAGNOSIS — Z01810 Encounter for preprocedural cardiovascular examination: Secondary | ICD-10-CM

## 2017-05-15 DIAGNOSIS — I70212 Atherosclerosis of native arteries of extremities with intermittent claudication, left leg: Secondary | ICD-10-CM | POA: Diagnosis not present

## 2017-05-15 NOTE — Addendum Note (Signed)
Addended by: Zebedee Iba on: 05/15/2017 09:14 AM   Modules accepted: Orders

## 2017-05-15 NOTE — Patient Instructions (Signed)
Medication Instructions:  Continue current medications  If you need a refill on your cardiac medications before your next appointment, please call your pharmacy.  Labwork: None Ordered  Testing/Procedures: None Ordered  Follow-Up: Your physician wants you to follow-up in: As Needed.      Thank you for choosing [facility]!!       

## 2017-05-15 NOTE — Pre-Procedure Instructions (Signed)
[name]  05/15/2017      [facility] - [address], [place] - [address] [place] [address] Phone: [phone] Fax: [phone]    Your procedure is scheduled on April 9  Report to [facility] at [place].  Call this number if you have problems the morning of surgery:  [phone]   Remember:  Do not eat food or drink liquids after midnight.  Continue all medications as directed by your physician except follow these medication instructions before surgery below   Take these medicines the morning of surgery with A SIP OF WATER  albuterol (PROVENTIL HFA;VENTOLIN HFA) albuterol (PROVENTIL) (2.5 MG/3ML)  alprazolam (XANAX) budesonide-formoterol (SYMBICORT)  cefdinir (OMNICEF) HYDROcodone-acetaminophen (NORCO)  metoprolol succinate (TOPROL-XL)  tiotropium (SPIRIVA HANDIHALER)  traMADol (ULTRAM)  TRINTELLIX  7 days prior to surgery STOP taking any Aspirin(unless otherwise instructed by your surgeon), Aleve, Naproxen, Ibuprofen, Motrin, Advil, Goody's, BC's, all herbal medications, fish oil, and all vitamins   WHAT DO I DO ABOUT MY DIABETES MEDICATION?   Marland Kitchen Do not take oral diabetes medicines (pills) the morning of surgery. metFORMIN (GLUCOPHAGE)   How to Manage Your Diabetes Before and After Surgery  Why is it important to control my blood sugar before and after surgery? . Improving blood sugar levels before and after surgery helps healing and can limit problems. . A way of improving blood sugar control is eating a healthy diet by: o  Eating less sugar and carbohydrates o  Increasing activity/exercise o  Talking with your doctor about reaching your blood sugar goals . High blood sugars (greater than 180 mg/dL) can raise your risk of infections and slow your recovery, so you will need to focus on controlling your diabetes during the weeks before surgery. . Make sure  that the doctor who takes care of your diabetes knows about your planned surgery including the date and location.  How do I manage my blood sugar before surgery? . Check your blood sugar at least 4 times a day, starting 2 days before surgery, to make sure that the level is not too high or low. o Check your blood sugar the morning of your surgery when you wake up and every 2 hours until you get to the Short Stay unit. . If your blood sugar is less than 70 mg/dL, you will need to treat for low blood sugar: o Do not take insulin. o Treat a low blood sugar (less than 70 mg/dL) with  cup of clear juice (cranberry or apple), 4 glucose tablets, OR glucose gel. o Recheck blood sugar in 15 minutes after treatment (to make sure it is greater than 70 mg/dL). If your blood sugar is not greater than 70 mg/dL on recheck, call [phone] for further instructions. . Report your blood sugar to the short stay nurse when you get to Short Stay.  . If you are admitted to the hospital after surgery: o Your blood sugar will be checked by the staff and you will probably be given insulin after surgery (instead of oral diabetes medicines) to make sure you have good blood sugar levels. o The goal for blood sugar control after surgery is 80-180 mg/dL.    Do not wear jewelry, make-up or nail polish.  Do not wear lotions, powders, or perfumes, or deodorant.  Do not shave 48 hours prior to surgery.    Do not bring valuables  to the hospital.  [facility] is not responsible for any belongings or valuables.  Contacts, dentures or bridgework may not be worn into surgery.  Leave your suitcase in the car.  After surgery it may be brought to your room.  For patients admitted to the hospital, discharge time will be determined by your treatment team.  Patients discharged the day of surgery will not be allowed to drive home.    Special instructions:   [facility]- Preparing For Surgery  Before surgery, you can play an  important role. Because skin is not sterile, your skin needs to be as free of germs as possible. You can reduce the number of germs on your skin by washing with CHG (chlorahexidine gluconate) Soap before surgery.  CHG is an antiseptic cleaner which kills germs and bonds with the skin to continue killing germs even after washing.  Please do not use if you have an allergy to CHG or antibacterial soaps. If your skin becomes reddened/irritated stop using the CHG.  Do not shave (including legs and underarms) for at least 48 hours prior to first CHG shower. It is OK to shave your face.  Please follow these instructions carefully.   1. Shower the NIGHT BEFORE SURGERY and the MORNING OF SURGERY with CHG.   2. If you chose to wash your hair, wash your hair first as usual with your normal shampoo.  3. After you shampoo, rinse your hair and body thoroughly to remove the shampoo.  4. Use CHG as you would any other liquid soap. You can apply CHG directly to the skin and wash gently with a scrungie or a clean washcloth.   5. Apply the CHG Soap to your body ONLY FROM THE NECK DOWN.  Do not use on open wounds or open sores. Avoid contact with your eyes, ears, mouth and genitals (private parts). Wash Face and genitals (private parts)  with your normal soap.  6. Wash thoroughly, paying special attention to the area where your surgery will be performed.  7. Thoroughly rinse your body with warm water from the neck down.  8. DO NOT shower/wash with your normal soap after using and rinsing off the CHG Soap.  9. Pat yourself dry with a CLEAN TOWEL.  10. Wear CLEAN PAJAMAS to bed the night before surgery, wear comfortable clothes the morning of surgery  11. Place CLEAN SHEETS on your bed the night of your first shower and DO NOT SLEEP WITH PETS.    Day of Surgery: Do not apply any deodorants/lotions. Please wear clean clothes to the hospital/surgery center.      Please read over the following fact sheets  that you were given.

## 2017-05-16 ENCOUNTER — Encounter (HOSPITAL_COMMUNITY)
Admission: RE | Admit: 2017-05-16 | Discharge: 2017-05-16 | Disposition: A | Discharge status: Home / Self Care | Payer: Medicare Other | Source: Ambulatory Visit | Attending: Vascular Surgery | Admitting: Vascular Surgery

## 2017-05-16 ENCOUNTER — Emergency Department (HOSPITAL_COMMUNITY)
Admission: EM | Admit: 2017-05-16 | Discharge: 2017-05-17 | Disposition: A | Discharge status: Home / Self Care | Payer: Medicare Other | Attending: Emergency Medicine | Admitting: Emergency Medicine

## 2017-05-16 ENCOUNTER — Other Ambulatory Visit: Payer: Self-pay

## 2017-05-16 ENCOUNTER — Emergency Department (HOSPITAL_COMMUNITY): Payer: Medicare Other

## 2017-05-16 DIAGNOSIS — J449 Chronic obstructive pulmonary disease, unspecified: Secondary | ICD-10-CM | POA: Insufficient documentation

## 2017-05-16 DIAGNOSIS — I959 Hypotension, unspecified: Secondary | ICD-10-CM | POA: Diagnosis not present

## 2017-05-16 DIAGNOSIS — Z79899 Other long term (current) drug therapy: Secondary | ICD-10-CM | POA: Insufficient documentation

## 2017-05-16 DIAGNOSIS — F17210 Nicotine dependence, cigarettes, uncomplicated: Secondary | ICD-10-CM | POA: Diagnosis not present

## 2017-05-16 DIAGNOSIS — R4182 Altered mental status, unspecified: Secondary | ICD-10-CM | POA: Diagnosis present

## 2017-05-16 DIAGNOSIS — I998 Other disorder of circulatory system: Secondary | ICD-10-CM | POA: Diagnosis not present

## 2017-05-16 DIAGNOSIS — Z85828 Personal history of other malignant neoplasm of skin: Secondary | ICD-10-CM | POA: Insufficient documentation

## 2017-05-16 DIAGNOSIS — E119 Type 2 diabetes mellitus without complications: Secondary | ICD-10-CM | POA: Insufficient documentation

## 2017-05-16 DIAGNOSIS — I5032 Chronic diastolic (congestive) heart failure: Secondary | ICD-10-CM | POA: Insufficient documentation

## 2017-05-16 DIAGNOSIS — Z8541 Personal history of malignant neoplasm of cervix uteri: Secondary | ICD-10-CM | POA: Insufficient documentation

## 2017-05-16 DIAGNOSIS — Z7984 Long term (current) use of oral hypoglycemic drugs: Secondary | ICD-10-CM | POA: Insufficient documentation

## 2017-05-16 HISTORY — DX: Paroxysmal atrial fibrillation: I48.0

## 2017-05-16 LAB — I-STAT VENOUS BLOOD GAS, ED
ACID-BASE EXCESS: 7 mmol/L — AB (ref 0.0–2.0)
BICARBONATE: 33.4 mmol/L — AB (ref 20.0–28.0)
O2 SAT: 85 %
PCO2 VEN: 55.2 mmHg (ref 44.0–60.0)
PO2 VEN: 53 mmHg — AB (ref 32.0–45.0)
TCO2: 35 mmol/L — ABNORMAL HIGH (ref 22–32)
pH, Ven: 7.39 (ref 7.250–7.430)

## 2017-05-16 LAB — CBC WITH DIFFERENTIAL/PLATELET
BASOS ABS: 0 10*3/uL (ref 0.0–0.1)
BASOS PCT: 1 %
Eosinophils Absolute: 0.2 10*3/uL (ref 0.0–0.7)
Eosinophils Relative: 3 %
HEMATOCRIT: 45.5 % (ref 36.0–46.0)
HEMOGLOBIN: 14.2 g/dL (ref 12.0–15.0)
LYMPHS PCT: 45 %
Lymphs Abs: 3.6 10*3/uL (ref 0.7–4.0)
MCH: 29.3 pg (ref 26.0–34.0)
MCHC: 31.2 g/dL (ref 30.0–36.0)
MCV: 93.8 fL (ref 78.0–100.0)
MONO ABS: 0.4 10*3/uL (ref 0.1–1.0)
Monocytes Relative: 5 %
NEUTROS ABS: 3.7 10*3/uL (ref 1.7–7.7)
NEUTROS PCT: 46 %
Platelets: 232 10*3/uL (ref 150–400)
RBC: 4.85 MIL/uL (ref 3.87–5.11)
RDW: 13.5 % (ref 11.5–15.5)
WBC: 7.9 10*3/uL (ref 4.0–10.5)

## 2017-05-16 LAB — COMPREHENSIVE METABOLIC PANEL
ALBUMIN: 3 g/dL — AB (ref 3.5–5.0)
ALT: 16 U/L (ref 14–54)
ANION GAP: 11 (ref 5–15)
AST: 23 U/L (ref 15–41)
Alkaline Phosphatase: 55 U/L (ref 38–126)
BILIRUBIN TOTAL: 0.4 mg/dL (ref 0.3–1.2)
BUN: 7 mg/dL (ref 6–20)
CALCIUM: 9.2 mg/dL (ref 8.9–10.3)
CO2: 28 mmol/L (ref 22–32)
Chloride: 100 mmol/L — ABNORMAL LOW (ref 101–111)
Creatinine, Ser: 0.73 mg/dL (ref 0.44–1.00)
GFR calc non Af Amer: >60 mL/min (ref >60)
GLUCOSE: 210 mg/dL — AB (ref 65–99)
POTASSIUM: 3.3 mmol/L — AB (ref 3.5–5.1)
SODIUM: 139 mmol/L (ref 135–145)
TOTAL PROTEIN: 7.1 g/dL (ref 6.5–8.1)

## 2017-05-16 LAB — I-STAT CG4 LACTIC ACID, ED
LACTIC ACID, VENOUS: 0.72 mmol/L (ref 0.5–1.9)
Lactic Acid, Venous: 1.5 mmol/L (ref 0.5–1.9)

## 2017-05-16 LAB — URINALYSIS, ROUTINE W REFLEX MICROSCOPIC
Bilirubin Urine: NEGATIVE
GLUCOSE, UA: NEGATIVE mg/dL
Hgb urine dipstick: NEGATIVE
KETONES UR: NEGATIVE mg/dL
LEUKOCYTES UA: NEGATIVE
NITRITE: NEGATIVE
PH: 6 (ref 5.0–8.0)
Protein, ur: NEGATIVE mg/dL
SPECIFIC GRAVITY, URINE: 1.017 (ref 1.005–1.030)

## 2017-05-16 LAB — GLUCOSE, CAPILLARY: GLUCOSE-CAPILLARY: 220 mg/dL — AB (ref 65–99)

## 2017-05-16 MED ORDER — SODIUM CHLORIDE 0.9 % IV BOLUS (SEPSIS)
1000.0000 mL | Freq: Once | INTRAVENOUS | Status: AC
  Administered 2017-05-16: 1000 mL via INTRAVENOUS

## 2017-05-16 MED ORDER — HYDROCODONE-ACETAMINOPHEN 5-325 MG PO TABS
2.0000 | ORAL_TABLET | Freq: Once | ORAL | Status: AC
  Administered 2017-05-16: 2 via ORAL
  Filled 2017-05-16: qty 2

## 2017-05-16 MED ORDER — MORPHINE SULFATE (PF) 4 MG/ML IV SOLN
4.0000 mg | Freq: Once | INTRAVENOUS | Status: AC
  Administered 2017-05-16: 4 mg via INTRAVENOUS
  Filled 2017-05-16: qty 1

## 2017-05-16 MED ORDER — HYDROCODONE-ACETAMINOPHEN 5-325 MG PO TABS: 1.0000 | ORAL_TABLET | Freq: Once | ORAL | Status: DC

## 2017-05-16 NOTE — Significant Event (Signed)
[facility] Event Note  Overview: Time Called: 2683 Arrival Time: 4196 Event Type: Hypotension, Neurologic  Initial Focused Assessment: Called to come see patient in preadmit d/t hypotention and increased lethargy. Upon arrival patient appear lethargic, skin noted to be dry, flaky, with tenting. Patient on oxygen therapy, has mild tachycardia and denies SOB. Patient able to answer simple questions and follows all commands. Nurse practitioner at bedside with patient and request that [facility] transport patient to ED for further evaluation.   Interventions: Called ED charge nurse for room assignment Transported patient to [facility] for further workup  Plan of Care (if not transferred):  Event Summary: Name of Physician Notified: Nurse practictioner with patient at bedside upon arrival          Outcome: Transferred (Comment)(Transferred to emergency department for evaluation)     [name]

## 2017-05-16 NOTE — ED Notes (Signed)
I Placed as perwick on patient. She stated we could not do an in and out cath on her.

## 2017-05-16 NOTE — ED Triage Notes (Signed)
Pt arrives from Short Stay surgery center after she arrived there and was lethargic and hypotensive. [facility] was called and transported pt to ED. On arrival Pt is alert to voice, seems sleepy.

## 2017-05-16 NOTE — ED Notes (Signed)
Pt. Asked to use bedside commode. Purewick was removed and sample collect via bedside commode.

## 2017-05-16 NOTE — ED Notes (Signed)
Bedside hand-48ff to Delmar Landau RN

## 2017-05-16 NOTE — ED Notes (Signed)
Pt keeping both eyes closed she is very sleepy.  Good pedal pulse lt leg  The entire leg is cooler than the rt and the lt leg has a sl  Darker color  Than her rt.  She is c/o pain  And keeps moving the lt leg as if to decrease the pain.  She denies taking any pain med for one week.  She has no idea why she is sleepy

## 2017-05-16 NOTE — ED Notes (Signed)
Pt has not been able to urinate for me since I received this patient from [facility]

## 2017-05-16 NOTE — Progress Notes (Signed)
Patient came in for pre-op appointment with a grey appearance, decreased oxygen saturation, tachycardic, and hypotension. Pressure checked multiple times with readings 70s/50s.  Anesthesia NP Levada Dy called for assistance.  Instructions were to take Mrs. Maheu to the emergency to be evaluated for the low pressure.   Patient is on continuous oxygen at home with no portable oxygen tank for traveling (O2 saturation was 87-89 upon arrival to short stay)  [facility] was also called for assistance to help with transport to the ED.  Patient was Alert and Oriented to self with some confusion at times. Her Son-in-law stated that she has been more confused this morning and that Mrs. Frei did not remember him giving her the medications she takes in the morning.  CBG was 220.    Son-in-law was with the patient and name and contact information was provided and given to the ED RN.  Dr Daphene Calamity office was called.  I spoke to [facility] about the patients condition.  Just wanted to make the office aware that the patients was in the emergency room to be evaluated for the low blood pressure.  I also made the office aware that the pre-op appointment was not completed due to the hypotension.

## 2017-05-16 NOTE — ED Notes (Signed)
ED Provider at bedside. 

## 2017-05-16 NOTE — Discharge Instructions (Addendum)
You are evaluated in the emergency department for confusion and low blood pressure.  These improved over time here.  We did not find an obvious cause and is possibly related to pain medications that you take.  Please make sure you take only the medications that are prescribed.  Keep well-hydrated.  Call the vascular clinic tomorrow to reschedule your preop testing.  Return to the emergency department if any worsening symptoms.

## 2017-05-16 NOTE — ED Notes (Signed)
The pt has been moved to rm 35  She is asking  If she can go home  Not sure but I think not at present

## 2017-05-16 NOTE — ED Notes (Signed)
Patient transported to CT 

## 2017-05-16 NOTE — ED Notes (Signed)
Spoke with granddaughter [name] who sts will be here in 30 mins

## 2017-05-16 NOTE — ED Provider Notes (Signed)
[facility] EMERGENCY DEPARTMENT Provider Note   CSN: [id number] Arrival date & time: 05/16/17  1449     History   Chief Complaint Chief Complaint  Patient presents with  . Altered Mental Status    HPI [name] is a 62 y.o. female.  Level 5 caveat secondary to altered mental status.  Patient with a history of COPD vascular disease who was up and short stay today for preop testing for a left femoropopliteal bypass, transfered down to the emergency department for hypotension.  There reportedly had blood pressure 70/50 with tachycardia 107.  Her fingerstick was over 200 up in short stay.  Her son brought her here for the procedure but is not available now to give any history.  Patient herself is groggy but will answer simple questions.  She states she has severe left leg pain.  She denies that she took any narcotics and only took Tylenol for the pain.  She appears in no distress.  The history is provided by the patient.  Altered Mental Status   This is a new problem. Episode onset: unclear. The problem has not changed since onset.Associated symptoms include somnolence and weakness. Her past medical history is significant for diabetes, COPD and depression.    Past Medical History:  Diagnosis Date  . ADD (attention deficit disorder with hyperactivity)   . Anxiety   . Arthritis    "neck" (06/16/2015)  . Asthma   . Basal cell carcinoma, face   . Cervical cancer ([place])   . Chronic bronchitis ([place])   . 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 COPD (chronic obstructive pulmonary disease) ([place])   . Depression   . GERD (gastroesophageal reflux disease)   . Hypertension   . On home oxygen therapy    "2L at bedtime" (06/16/2015)  . Squamous cell carcinoma, face   . Thyroid cyst   . Type II diabetes mellitus [facility])     Patient Active Problem List   Diagnosis Date Noted  . PAF (paroxysmal atrial fibrillation) ([place]) 05/15/2017  . Altered mental status 04/18/2017  . Acute  encephalopathy 04/18/2017  . Respiratory failure ([place])   . 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 Chronic diastolic CHF (congestive heart failure) ([place]) 08/08/2016  . Protein-calorie malnutrition, moderate ([place]) 06/16/2015  . Hypokalemia 06/04/2015  . Orthostatic hypotension 06/04/2015  . Syncope 06/04/2015  . Head injury with loss of consciousness ([place]) 06/04/2015  . Dehydration 06/04/2015  . Accelerated hypertension 06/04/2015  . Syncope and collapse 06/04/2015  . Pyuria 06/04/2015  . Cigarette smoker 06/04/2015  . Diabetes mellitus without complication ([place]) 32/67/1245  . HLD (hyperlipidemia) 01/03/2014  . COPD exacerbation ([place]) 01/03/2014  . Depression   . Hypertension   . COPD GOLD II/ still smoking    . Asthma   . ADHD (attention deficit hyperactivity disorder)   . Chest pain 12/15/2013  . GERD (gastroesophageal reflux disease)   . Anxiety   . Bronchitis   . Skin lesion of right arm 10/20/2011  . Post op infection 03/24/2011  . Incisional hernia 01/20/2011  . Wound infection after surgery 09/09/2010    Past Surgical History:  Procedure Laterality Date  . ABDOMINAL AORTOGRAM N/A 04/05/2017   Procedure: ABDOMINAL AORTOGRAM;  Surgeon: Conrad New Hampton, MD;  Location: [facility];  Service: Vascular;  Laterality: N/A;  . ABDOMINAL HYSTERECTOMY    . ANTERIOR CERVICAL DECOMP/DISCECTOMY FUSION    . AORTOGRAM Left 04/05/2017   Procedure: Angiogram with  left leg runoff;  Surgeon: Conrad , MD;  Location: [facility];  Service: Vascular;  Laterality: Left;  . BACK SURGERY    . BASAL CELL CARCINOMA EXCISION     "face"  . COLECTOMY    . EXCISIONAL HEMORRHOIDECTOMY    . HERNIA REPAIR  X 3   incisional/ventral hernia repair "in my stomach where the womb is"  . LAPAROSCOPIC CHOLECYSTECTOMY    . OOPHORECTOMY  2006   cervical cancer  . RECTOVAGINAL FISTULA CLOSURE  2012  . SQUAMOUS CELL CARCINOMA EXCISION    . TUBAL LIGATION    . VULVA  SURGERY  2006   Cancer in situ  . WRIST SURGERY Right X 1   "accidentially cut it"  . WRIST SURGERY Left    "don't remember why"     OB History   None      Home Medications    Prior to Admission medications   Medication Sig Start Date End Date Taking? Authorizing Provider  albuterol (PROVENTIL HFA;VENTOLIN HFA) 108 (90 BASE) MCG/ACT inhaler Inhale 2 puffs into the lungs 2 (two) times daily as needed for wheezing.    [provider]  albuterol (PROVENTIL) (2.5 MG/3ML) 0.083% nebulizer solution Take 3 mLs (2.5 mg total) by nebulization every 4 (four) hours as needed for wheezing or shortness of breath. 01/27/17   Patrecia Pour, MD  alprazolam Duanne Moron) 2 MG tablet Take 0.5 tablets (1 mg total) by mouth at bedtime as needed for sleep or anxiety (Can take up to 3 thimes daily prn). Patient takes 1/2 tablet by mouth four times a day Patient taking differently: Take 1-2 mg by mouth See admin instructions. 1 mg by mouth in the morning then 1 mg at noon(time) then 1 mg at 4 PM then 2 mg at bedtime 01/05/14   Theodis Blaze, MD  atorvastatin (LIPITOR) 20 MG tablet Take 20 mg by mouth at bedtime 01/16/17   [provider]  budesonide-formoterol (SYMBICORT) 160-4.5 MCG/ACT inhaler Inhale 2 puffs into the lungs 2 (two) times daily. 01/27/17   Patrecia Pour, MD  cefdinir (OMNICEF) 300 MG capsule Take 1 capsule (300 mg total) by mouth 2 (two) times daily. 04/21/17   Purohit, Konrad Dolores, MD  glipiZIDE (GLUCOTROL) 10 MG tablet Take 10 mg by mouth 2 (two) times daily before a meal.     [provider]  hydrochlorothiazide (HYDRODIURIL) 25 MG tablet Take 25 mg by mouth once a day 11/15/16   [provider]  HYDROcodone-acetaminophen (NORCO) 10-325 MG tablet Take 1 tablet by mouth every 6 (six) hours as needed for moderate pain.  03/05/15   [provider]  HYDROGEN PEROXIDE EX Place 1 drop into the right ear daily as needed (ear pain).    [provider]    metFORMIN (GLUCOPHAGE) 1000 MG tablet Take 1,000 mg by mouth two times a day 11/15/16   [provider]  metoprolol succinate (TOPROL-XL) 100 MG 24 hr tablet Take 100 mg by mouth once a day 01/16/17   [provider]  nortriptyline (PAMELOR) 25 MG capsule Take 2 capsules (50 mg total) by mouth at bedtime. Patient taking differently: Take 75 mg by mouth at bedtime.  06/19/15   Eugenie Filler, MD  OXYGEN Inhale 3 L into the lungs continuous.    [provider]  Respiratory Therapy Supplies (FLUTTER) DEVI Use as directed 10/12/16   Tanda Rockers, MD  REXULTI 4 MG TABS Take 4 mg by mouth every  morning. 03/11/15   [provider]  tiotropium (SPIRIVA HANDIHALER) 18 MCG inhalation capsule Place 1 capsule (18 mcg total) into inhaler and inhale daily. 01/27/17   Patrecia Pour, MD  traMADol (ULTRAM) 50 MG tablet Take 1 tablet (50 mg total) by mouth every 6 (six) hours as needed. 05/07/17   Conrad Hamilton, MD  TRINTELLIX 20 MG TABS Take 20 mg by mouth in the morning 01/01/17   [provider]  clomiPRAMINE (ANAFRANIL) 25 MG capsule Take 25 mg by mouth at bedtime.   05/08/11  [provider]    Family History Family History  Problem Relation Age of Onset  . COPD Father   . Cancer Father        lung  . COPD Mother   . Asthma Mother   . Cancer Paternal Aunt        lung    Social History Social History   Tobacco Use  . Smoking status: Current Every Day Smoker    Packs/day: 1.00    Years: 47.00    Pack years: 47.00    Types: Cigarettes  . Smokeless tobacco: Never Used  Substance Use Topics  . Alcohol use: No  . Drug use: No     Allergies   Contrast media [iodinated diagnostic agents]; Doxycycline; Erythromycin; Penicillins; Amoxicillin; and Metronidazole   Review of Systems Review of Systems  Unable to perform ROS: Mental status change  Constitutional: Negative for chills and fever.  HENT: Negative for nosebleeds and sore throat.    Eyes: Negative for pain and visual disturbance.  Respiratory: Negative for chest tightness and shortness of breath.   Cardiovascular: Negative for chest pain and palpitations.  Gastrointestinal: Negative for abdominal pain, diarrhea, nausea and vomiting.  Genitourinary: Negative for dysuria and hematuria.  Musculoskeletal: Negative for back pain and neck pain.  Neurological: Positive for weakness and headaches.     Physical Exam Updated Vital Signs BP 101/73 (BP Location: Right Arm)   Pulse 83   Resp (!) 22   SpO2 97%   Physical Exam  Constitutional: She appears well-developed. She appears lethargic. She appears cachectic.  Non-toxic appearance. No distress.  HENT:  Head: Normocephalic and atraumatic.  Eyes: Conjunctivae are normal.  Neck: Neck supple.  Cardiovascular: Normal rate and regular rhythm.  No murmur heard. Pulmonary/Chest: Effort normal and breath sounds normal. No respiratory distress.  Abdominal: Soft. There is no tenderness.  Musculoskeletal: Normal range of motion. She exhibits tenderness (She is tender left lower extremity but it is pink and warm.  I do not feel any peripheral pulses in that leg though.). She exhibits no edema.  Neurological: She has normal strength. She appears lethargic. No cranial nerve deficit or sensory deficit. GCS eye subscore is 3. GCS verbal subscore is 4. GCS motor subscore is 6.  She is slow to speak and has slurred speech.  She appears very tired.  She is cooperative and has symmetric strength.  Skin: Skin is warm and dry.  Psychiatric: She has a normal mood and affect.  Nursing note and vitals reviewed.    ED Treatments / Results  Labs (all labs ordered are listed, but only abnormal results are displayed) Labs Reviewed  URINE CULTURE - Abnormal; Notable for the following components:      Result Value   Culture MULTIPLE SPECIES PRESENT, SUGGEST RECOLLECTION (*)    All other components within normal limits  COMPREHENSIVE  METABOLIC PANEL - Abnormal; Notable for the following components:   Potassium  3.3 (*)    Chloride 100 (*)    Glucose, Bld 210 (*)    Albumin 3.0 (*)    All other components within normal limits  I-STAT VENOUS BLOOD GAS, ED - Abnormal; Notable for the following components:   pO2, Ven 53.0 (*)    Bicarbonate 33.4 (*)    TCO2 35 (*)    Acid-Base Excess 7.0 (*)    All other components within normal limits  CULTURE, BLOOD (ROUTINE X 2)  CULTURE, BLOOD (ROUTINE X 2)  CBC WITH DIFFERENTIAL/PLATELET  URINALYSIS, ROUTINE W REFLEX MICROSCOPIC  I-STAT CG4 LACTIC ACID, ED  I-STAT CG4 LACTIC ACID, ED  I-STAT TROPONIN, ED    EKG EKG Interpretation  Date/Time:  Wednesday May 16 2017 15:03:00 EDT Ventricular Rate:  71 PR Interval:    QRS Duration: 98 QT Interval:  412 QTC Calculation: 448 R Axis:   91 Text Interpretation:  Sinus rhythm Right axis deviation new sinus rhythm  and slower rate than prior ecg nonspecific t waves compared with 04/20/17 Confirmed by Aletta Edouard [id number]) on 05/16/2017 3:15:32 PM   Radiology Ct Head Wo Contrast  Result Date: 05/16/2017 CLINICAL DATA:  Altered level of consciousness. EXAM: CT HEAD WITHOUT CONTRAST TECHNIQUE: Contiguous axial images were obtained from the base of the skull through the vertex without intravenous contrast. COMPARISON:  04/18/2017 FINDINGS: Brain: No evidence of acute infarction, hemorrhage, hydrocephalus, extra-axial collection or mass lesion/mass effect. Chronic small vessel ischemic change in the bilateral thalamus, stable from prior. Vascular: Atherosclerotic calcification.  No high-density vessel. Skull: No acute or aggressive finding. Sinuses/Orbits: Negative IMPRESSION: 1. No acute finding or change from prior. 2. Chronic small vessel ischemic change at the bilateral thalamus. Electronically Signed   By: Monte Fantasia M.D.   On: 05/16/2017 18:38   Dg Chest Port 1 View  Result Date: 05/16/2017 CLINICAL DATA:  Weakness, chronic  bronchitis EXAM: PORTABLE CHEST 1 VIEW COMPARISON:  04/21/2017 FINDINGS: The heart size and mediastinal contours are within normal limits. Both lungs are clear. The visualized skeletal structures are unremarkable. IMPRESSION: No active disease. Electronically Signed   By: Kathreen Devoid   On: 05/16/2017 15:19    Procedures Procedures (including critical care time)  Medications Ordered in ED Medications  sodium chloride 0.9 % bolus 1,000 mL (has no administration in time range)    And  sodium chloride 0.9 % bolus 1,000 mL (has no administration in time range)     Initial Impression / Assessment and Plan / ED Course  I have reviewed the triage vital signs and the nursing notes.  Pertinent labs & imaging results that were available during my care of the patient were reviewed by me and considered in my medical decision making (see chart for details).  Clinical Course as of May 19 1055  Wed May 16, 2017  1508 ECG is normal sinus rhythm rate of 77 normal intervals right axis deviation poor R wave progression precordium no acute ST-T changes.   [MB]  1512 Cardiac echo 3/19 - Impressions:  - LVEF 60-65%, mild LVH, normal wall motion, grade 1 DD,   indeterminate LV filling pressure, aortic sclerosis with trivial   AI, mild MR, normal LA size, dilated IVC, unable to estimate RVSP   due to lack of adequate TR jet.    [MB]  1513 Differential diagnosis includes sepsis, myocardial ischemia, dehydration, overmedication, hypoventilation, anemia.   [MB]  0938 Reevaluated patient blood pressure up to the 120s now she looks a little more alert.  She is continuing to ask for something for pain states she was on hydrocodone before.  Son-in-law has not returned, he was supposedly a parking the car.  I will need to touch base with the vascular team regarding any information they might have.   [MB]  1728 Discussed with vascular on-call Dr. Trula Slade.  Patient is scheduled for surgery on April 9.  Dr. Bridgett Larsson  would not be available until then.  He does not see a reason for vascular to admit the patient currently but would be available as consult if the patient requires medical admission.   [MB]  5427 Reevaluated patient.  She still remains sleepy arousable and complaining of pain despite oral pain medication.  Pressures have been in the 120s and so for the rest of her metabolic workup appears unimpressive.  Still waiting on head CT and urinalysis.  Likely will need a medical admission for possible overmedication versus other etiologies identified.   [MB]  0623 Discussed with patient contact Margaretha Seeds her granddaughter.  She states the patient has been excessively tired since yesterday and today could not even put her socks on.  She states she has been responsible giving her medications since the last episode when she had been overmedicated.  She does not think the patient had access to anything else.   [MB]  1914 Reevaluated-patient continues to be somewhat more alert.  Still looks very tired but speech is less slurred.  She is a eaten a little bit here.  Continues to ask me for pain medicine, she states whenever we have been giving her is not been helping.  Will order a dose of morphine.  Would need to be very careful with her COPD and concerned that she would lose her respiratory drive.   [MB]  1915   Awaiting family.   [MB]  2256 granddaughter is here now.  She states the patient's mental status is baseline for her.  She is comfortable taking her home and the patient would like to go home to.  Her blood pressure is normalized and her labs did not show an obvious cause for her decline.  It seems most likely transiently related to polypharmacy.   [MB]    Clinical Course User Index [MB] Hayden Rasmussen, MD     Final Clinical Impressions(s) / ED Diagnoses   Final diagnoses:  Altered mental status, unspecified altered mental status type  Hypotension, unspecified hypotension type  Ischemia of  left lower extremity    ED Discharge Orders    None       Hayden Rasmussen, MD 05/18/17 1101

## 2017-05-17 ENCOUNTER — Telehealth: Payer: Self-pay | Admitting: *Deleted

## 2017-05-17 NOTE — Telephone Encounter (Signed)
Dr Percival Spanish office note faxed to V[facility] and Vein specialist,, clearing pt for surgery

## 2017-05-18 ENCOUNTER — Encounter (HOSPITAL_COMMUNITY): Payer: Self-pay

## 2017-05-18 LAB — URINE CULTURE

## 2017-05-18 NOTE — Progress Notes (Signed)
Anesthesia Chart Review:  Pt is a 62 year old female scheduled for L common femoral-above the knee popliteal bypass graft on 05/22/2017 with Adele Barthel, MD  - PCP is Gala Romney, MD - Saw cardiologist Minus Breeding, MD 05/15/17 for pre-op eval and was cleared for surgery. F/u prn recommended.    Pt arrived to pre-admission testing on 05/16/17 hypotensive, bradycardic, hypoxic (O2 sat 87% prior to administration of O2), and confused.  She does not have portable O2 and was without her O2 for an unknown duration of time prior to arrival.  Rapid response was called and patient was transported to ED for further evaluation.   PMH includes:  PAF (one episode in the setting of bronchodilators and intubation for hypercarbic respiratory failure 04/18/17), HTN, DM, COPD, uses home O2 2L at all times, ADD, thyroid cyst, GERD. Current smoker. BMI 22  - ED visit 05/16/17 for altered mental status. Over course of several hour visit, pt's mental status returned to baseline, and her status including VS, improved.  Pt discharged home. "Her blood pressure is normalized and her labs did not show an obvious cause for her decline.  It seems most likely transiently related to polypharmacy."  - Hospitalized 3/6-04/21/16 for acute hypercarbic respiratory failure due to accidental alprazolam and opiate overdose. She was briefly intubated and then extubated back to her home 3L nasal cannula. New onset afib in the setting of getting bronchodilators and intubation for hypercarbic respiratory failure; converted to SR on her own. Metoprolol and eliquis started.    Medications include: Albuterol, alprazolam, Lipitor, Symbicort, glipizide, Norco, metformin, metoprolol, Spiriva  BP (!) 73/51   Pulse (!) 107   Temp 36.7 C (Oral)   Resp 18   Ht 5\' 5"  (1.651 m)   Wt 134 lb 4 oz (60.9 kg)   SpO2 94%   BMI 22.34 kg/m    Labs from ED visit reviewed and CBC and CMET are acceptable for surgery.  - Glucose 210. HbA1c was 6.5 - PT and  PTT, T&S will be obtained day of surgery.   1 view CXR 05/16/17:  1. No acute finding or change from prior. 2. Chronic small vessel ischemic change at the bilateral thalamus.  EKG 05/16/17: SR. Right axis deviation  CT head 05/16/17:  1. No acute finding or change from prior. 2. Chronic small vessel ischemic change at the bilateral thalamus.  Echo 04/20/17:  - Left ventricle: The cavity size was normal. Wall thickness was increased in a pattern of mild LVH. Systolic function was normal. The estimated ejection fraction was in the range of 60% to 65%. Wall motion was normal; there were no regional wall motion abnormalities. Doppler parameters are consistent with abnormal left ventricular relaxation (grade 1 diastolic dysfunction). The E/e&' ratio is between 8-15, suggesting indeterminate LV filling pressure. - Aortic valve: Sclerosis without stenosis. There was trivial regurgitation. - Mitral valve: Mildly thickened leaflets . There was mild regurgitation. - Left atrium: The atrium was normal in size. - Inferior vena cava: The vessel was dilated. The respirophasic diameter changes were blunted (< 50%), consistent with elevated central venous pressure. - Impressions: LVEF 60-65%, mild LVH, normal wall motion, grade 1 DD, indeterminate LV filling pressure, aortic sclerosis with trivial AI, mild MR, normal LA size, dilated IVC, unable to estimate RVSP due to lack of adequate TR jet.  Nuclear stress test 02/11/14:  - Normal stress nuclear study. - LV Ejection Fraction: 73%.  LV Wall Motion:  NL LV Function; NL Wall Motion  Reviewed recent ED visit, hospitalization with Dr. Gifford Shave.   If no changes, I anticipate pt can proceed with surgery as scheduled.   Willeen Cass, FNP-BC [facility]/Anesthesiology Phone: [phone] 05/18/2017 4:32 PM

## 2017-05-21 LAB — CULTURE, BLOOD (ROUTINE X 2)
CULTURE: NO GROWTH
CULTURE: NO GROWTH
SPECIAL REQUESTS: ADEQUATE
SPECIAL REQUESTS: ADEQUATE

## 2017-05-22 ENCOUNTER — Encounter (HOSPITAL_COMMUNITY)
Admission: RE | Disposition: A | Discharge status: Skilled Nursing Facility | Payer: Self-pay | Source: Ambulatory Visit | Attending: Vascular Surgery

## 2017-05-22 ENCOUNTER — Inpatient Hospital Stay (HOSPITAL_COMMUNITY): Payer: Medicare Other | Admitting: Anesthesiology

## 2017-05-22 ENCOUNTER — Inpatient Hospital Stay (HOSPITAL_COMMUNITY): Payer: Medicare Other | Admitting: Emergency Medicine

## 2017-05-22 ENCOUNTER — Inpatient Hospital Stay (HOSPITAL_COMMUNITY)
Admission: RE | Admit: 2017-05-22 | Discharge: 2017-05-26 | DRG: 254 | Disposition: A | Discharge status: Skilled Nursing Facility | Payer: Medicare Other | Source: Ambulatory Visit | Attending: Vascular Surgery | Admitting: Vascular Surgery

## 2017-05-22 ENCOUNTER — Encounter (HOSPITAL_COMMUNITY): Payer: Self-pay | Admitting: Certified Registered"

## 2017-05-22 DIAGNOSIS — Z716 Tobacco abuse counseling: Secondary | ICD-10-CM | POA: Diagnosis not present

## 2017-05-22 DIAGNOSIS — Z85828 Personal history of other malignant neoplasm of skin: Secondary | ICD-10-CM | POA: Diagnosis not present

## 2017-05-22 DIAGNOSIS — E041 Nontoxic single thyroid nodule: Secondary | ICD-10-CM | POA: Diagnosis present

## 2017-05-22 DIAGNOSIS — F329 Major depressive disorder, single episode, unspecified: Secondary | ICD-10-CM | POA: Diagnosis present

## 2017-05-22 DIAGNOSIS — Z7951 Long term (current) use of inhaled steroids: Secondary | ICD-10-CM

## 2017-05-22 DIAGNOSIS — Z7984 Long term (current) use of oral hypoglycemic drugs: Secondary | ICD-10-CM

## 2017-05-22 DIAGNOSIS — Z7901 Long term (current) use of anticoagulants: Secondary | ICD-10-CM | POA: Diagnosis not present

## 2017-05-22 DIAGNOSIS — J9811 Atelectasis: Secondary | ICD-10-CM

## 2017-05-22 DIAGNOSIS — I10 Essential (primary) hypertension: Secondary | ICD-10-CM | POA: Diagnosis present

## 2017-05-22 DIAGNOSIS — I70222 Atherosclerosis of native arteries of extremities with rest pain, left leg: Secondary | ICD-10-CM | POA: Diagnosis not present

## 2017-05-22 DIAGNOSIS — Z9981 Dependence on supplemental oxygen: Secondary | ICD-10-CM

## 2017-05-22 DIAGNOSIS — Z888 Allergy status to other drugs, medicaments and biological substances status: Secondary | ICD-10-CM | POA: Diagnosis not present

## 2017-05-22 DIAGNOSIS — Z79899 Other long term (current) drug therapy: Secondary | ICD-10-CM

## 2017-05-22 DIAGNOSIS — Z881 Allergy status to other antibiotic agents status: Secondary | ICD-10-CM

## 2017-05-22 DIAGNOSIS — E1151 Type 2 diabetes mellitus with diabetic peripheral angiopathy without gangrene: Secondary | ICD-10-CM | POA: Diagnosis present

## 2017-05-22 DIAGNOSIS — I739 Peripheral vascular disease, unspecified: Secondary | ICD-10-CM | POA: Diagnosis present

## 2017-05-22 DIAGNOSIS — F909 Attention-deficit hyperactivity disorder, unspecified type: Secondary | ICD-10-CM | POA: Diagnosis present

## 2017-05-22 DIAGNOSIS — Z91041 Radiographic dye allergy status: Secondary | ICD-10-CM | POA: Diagnosis not present

## 2017-05-22 DIAGNOSIS — Z8541 Personal history of malignant neoplasm of cervix uteri: Secondary | ICD-10-CM | POA: Diagnosis not present

## 2017-05-22 DIAGNOSIS — Z880 Allergy status to penicillin: Secondary | ICD-10-CM

## 2017-05-22 DIAGNOSIS — Z9889 Other specified postprocedural states: Secondary | ICD-10-CM | POA: Diagnosis not present

## 2017-05-22 DIAGNOSIS — F419 Anxiety disorder, unspecified: Secondary | ICD-10-CM | POA: Diagnosis present

## 2017-05-22 DIAGNOSIS — F17210 Nicotine dependence, cigarettes, uncomplicated: Secondary | ICD-10-CM | POA: Diagnosis present

## 2017-05-22 DIAGNOSIS — J449 Chronic obstructive pulmonary disease, unspecified: Secondary | ICD-10-CM | POA: Diagnosis present

## 2017-05-22 DIAGNOSIS — K219 Gastro-esophageal reflux disease without esophagitis: Secondary | ICD-10-CM | POA: Diagnosis present

## 2017-05-22 HISTORY — PX: FEMORAL-POPLITEAL BYPASS GRAFT: SHX937

## 2017-05-22 LAB — SURGICAL PCR SCREEN
MRSA, PCR: NEGATIVE
Staphylococcus aureus: NEGATIVE

## 2017-05-22 LAB — GLUCOSE, CAPILLARY
GLUCOSE-CAPILLARY: 184 mg/dL — AB (ref 65–99)
Glucose-Capillary: 165 mg/dL — ABNORMAL HIGH (ref 65–99)
Glucose-Capillary: 242 mg/dL — ABNORMAL HIGH (ref 65–99)
Glucose-Capillary: 261 mg/dL — ABNORMAL HIGH (ref 65–99)

## 2017-05-22 LAB — TYPE AND SCREEN
ABO/RH(D): A POS
ANTIBODY SCREEN: NEGATIVE

## 2017-05-22 LAB — APTT: APTT: 30 s (ref 24–36)

## 2017-05-22 LAB — PROTIME-INR
INR: 0.97
PROTHROMBIN TIME: 12.8 s (ref 11.4–15.2)

## 2017-05-22 SURGERY — BYPASS GRAFT FEMORAL-POPLITEAL ARTERY
Anesthesia: General | Site: Groin | Laterality: Left

## 2017-05-22 MED ORDER — OXYCODONE HCL 5 MG/5ML PO SOLN: 5.0000 mg | Freq: Once | ORAL | Status: DC | PRN

## 2017-05-22 MED ORDER — VANCOMYCIN HCL IN DEXTROSE 1-5 GM/200ML-% IV SOLN
INTRAVENOUS | Status: AC
  Filled 2017-05-22: qty 200

## 2017-05-22 MED ORDER — TIOTROPIUM BROMIDE MONOHYDRATE 18 MCG IN CAPS
18.0000 ug | ORAL_CAPSULE | Freq: Every day | RESPIRATORY_TRACT | Status: DC
  Administered 2017-05-22 – 2017-05-26 (×5): 18 ug via RESPIRATORY_TRACT
  Filled 2017-05-22: qty 5

## 2017-05-22 MED ORDER — ALUM & MAG HYDROXIDE-SIMETH 200-200-20 MG/5ML PO SUSP: 15.0000 mL | ORAL | Status: DC | PRN

## 2017-05-22 MED ORDER — DOCUSATE SODIUM 100 MG PO CAPS
100.0000 mg | ORAL_CAPSULE | Freq: Every day | ORAL | Status: DC
  Administered 2017-05-23 – 2017-05-26 (×4): 100 mg via ORAL
  Filled 2017-05-22 (×4): qty 1

## 2017-05-22 MED ORDER — POTASSIUM CHLORIDE CRYS ER 20 MEQ PO TBCR: 20.0000 meq | EXTENDED_RELEASE_TABLET | Freq: Every day | ORAL | Status: DC | PRN

## 2017-05-22 MED ORDER — CHLORHEXIDINE GLUCONATE 4 % EX LIQD: 60.0000 mL | Freq: Once | CUTANEOUS | Status: DC

## 2017-05-22 MED ORDER — SODIUM CHLORIDE 0.9 % IV SOLN: INTRAVENOUS | Status: DC

## 2017-05-22 MED ORDER — METOPROLOL TARTRATE 5 MG/5ML IV SOLN: 2.0000 mg | INTRAVENOUS | Status: DC | PRN

## 2017-05-22 MED ORDER — ROCURONIUM BROMIDE 10 MG/ML (PF) SYRINGE
PREFILLED_SYRINGE | INTRAVENOUS | Status: AC
  Filled 2017-05-22: qty 5

## 2017-05-22 MED ORDER — ONDANSETRON HCL 4 MG/2ML IJ SOLN: 4.0000 mg | Freq: Four times a day (QID) | INTRAMUSCULAR | Status: DC | PRN

## 2017-05-22 MED ORDER — LABETALOL HCL 5 MG/ML IV SOLN
INTRAVENOUS | Status: AC
  Filled 2017-05-22: qty 4

## 2017-05-22 MED ORDER — MUPIROCIN 2 % EX OINT
TOPICAL_OINTMENT | CUTANEOUS | Status: AC
  Administered 2017-05-22: 1 via TOPICAL
  Filled 2017-05-22: qty 22

## 2017-05-22 MED ORDER — ACETAMINOPHEN 325 MG PO TABS: 325.0000 mg | ORAL_TABLET | ORAL | Status: DC | PRN

## 2017-05-22 MED ORDER — METOPROLOL TARTRATE 5 MG/5ML IV SOLN
INTRAVENOUS | Status: DC | PRN
  Administered 2017-05-22 (×2): 1 mg via INTRAVENOUS

## 2017-05-22 MED ORDER — HEPARIN SODIUM (PORCINE) 5000 UNIT/ML IJ SOLN
5000.0000 [IU] | Freq: Three times a day (TID) | INTRAMUSCULAR | Status: DC
  Administered 2017-05-22 – 2017-05-26 (×12): 5000 [IU] via SUBCUTANEOUS
  Filled 2017-05-22 (×13): qty 1

## 2017-05-22 MED ORDER — PHENYLEPHRINE HCL 10 MG/ML IJ SOLN
INTRAVENOUS | Status: DC | PRN
  Administered 2017-05-22: 50 ug/min via INTRAVENOUS

## 2017-05-22 MED ORDER — HYDRALAZINE HCL 20 MG/ML IJ SOLN
5.0000 mg | INTRAMUSCULAR | Status: AC | PRN
  Administered 2017-05-24 – 2017-05-25 (×2): 5 mg via INTRAVENOUS
  Filled 2017-05-22 (×2): qty 1

## 2017-05-22 MED ORDER — GUAIFENESIN-DM 100-10 MG/5ML PO SYRP: 15.0000 mL | ORAL_SOLUTION | ORAL | Status: DC | PRN

## 2017-05-22 MED ORDER — MIDAZOLAM HCL 2 MG/2ML IJ SOLN
INTRAMUSCULAR | Status: AC
  Filled 2017-05-22: qty 2

## 2017-05-22 MED ORDER — FENTANYL CITRATE (PF) 100 MCG/2ML IJ SOLN
25.0000 ug | INTRAMUSCULAR | Status: DC | PRN
  Administered 2017-05-22: 50 ug via INTRAVENOUS
  Administered 2017-05-22 (×2): 25 ug via INTRAVENOUS

## 2017-05-22 MED ORDER — DEXAMETHASONE SODIUM PHOSPHATE 10 MG/ML IJ SOLN
INTRAMUSCULAR | Status: DC | PRN
  Administered 2017-05-22: 5 mg via INTRAVENOUS

## 2017-05-22 MED ORDER — NALOXONE HCL 0.4 MG/ML IJ SOLN: 0.4000 mg | INTRAMUSCULAR | Status: DC | PRN

## 2017-05-22 MED ORDER — CLINDAMYCIN PHOSPHATE 300 MG/50ML IV SOLN
300.0000 mg | Freq: Three times a day (TID) | INTRAVENOUS | Status: AC
  Administered 2017-05-23 (×2): 300 mg via INTRAVENOUS
  Filled 2017-05-22 (×3): qty 50

## 2017-05-22 MED ORDER — DIPHENHYDRAMINE HCL 50 MG/ML IJ SOLN: 12.5000 mg | Freq: Four times a day (QID) | INTRAMUSCULAR | Status: DC | PRN

## 2017-05-22 MED ORDER — PROPOFOL 10 MG/ML IV BOLUS
INTRAVENOUS | Status: AC
  Filled 2017-05-22: qty 20

## 2017-05-22 MED ORDER — MAGNESIUM SULFATE 2 GM/50ML IV SOLN
2.0000 g | Freq: Every day | INTRAVENOUS | Status: AC | PRN
  Administered 2017-05-26: 2 g via INTRAVENOUS
  Filled 2017-05-22: qty 50

## 2017-05-22 MED ORDER — ACETAMINOPHEN 325 MG RE SUPP: 325.0000 mg | RECTAL | Status: DC | PRN

## 2017-05-22 MED ORDER — 0.9 % SODIUM CHLORIDE (POUR BTL) OPTIME
TOPICAL | Status: DC | PRN
  Administered 2017-05-22: 2000 mL

## 2017-05-22 MED ORDER — ONDANSETRON HCL 4 MG/2ML IJ SOLN
INTRAMUSCULAR | Status: DC | PRN
  Administered 2017-05-22: 4 mg via INTRAVENOUS

## 2017-05-22 MED ORDER — METOPROLOL SUCCINATE ER 100 MG PO TB24
100.0000 mg | ORAL_TABLET | Freq: Every day | ORAL | Status: DC
  Administered 2017-05-23 – 2017-05-26 (×4): 100 mg via ORAL
  Filled 2017-05-22 (×4): qty 1

## 2017-05-22 MED ORDER — ALBUTEROL SULFATE (2.5 MG/3ML) 0.083% IN NEBU: 3.0000 mL | INHALATION_SOLUTION | Freq: Two times a day (BID) | RESPIRATORY_TRACT | Status: DC | PRN

## 2017-05-22 MED ORDER — FENTANYL CITRATE (PF) 100 MCG/2ML IJ SOLN
INTRAMUSCULAR | Status: AC
  Filled 2017-05-22: qty 2

## 2017-05-22 MED ORDER — INSULIN ASPART 100 UNIT/ML ~~LOC~~ SOLN
0.0000 [IU] | Freq: Three times a day (TID) | SUBCUTANEOUS | Status: DC
  Administered 2017-05-23: 3 [IU] via SUBCUTANEOUS
  Administered 2017-05-23 – 2017-05-24 (×3): 2 [IU] via SUBCUTANEOUS
  Administered 2017-05-25 (×2): 3 [IU] via SUBCUTANEOUS
  Administered 2017-05-25: 2 [IU] via SUBCUTANEOUS
  Administered 2017-05-26: 8 [IU] via SUBCUTANEOUS
  Administered 2017-05-26: 2 [IU] via SUBCUTANEOUS

## 2017-05-22 MED ORDER — METOPROLOL TARTRATE 5 MG/5ML IV SOLN
INTRAVENOUS | Status: AC
  Filled 2017-05-22: qty 5

## 2017-05-22 MED ORDER — LIDOCAINE 2% (20 MG/ML) 5 ML SYRINGE
INTRAMUSCULAR | Status: DC | PRN
  Administered 2017-05-22: 40 mg via INTRAVENOUS

## 2017-05-22 MED ORDER — ROCURONIUM BROMIDE 100 MG/10ML IV SOLN
INTRAVENOUS | Status: DC | PRN
  Administered 2017-05-22: 50 mg via INTRAVENOUS
  Administered 2017-05-22: 10 mg via INTRAVENOUS

## 2017-05-22 MED ORDER — SODIUM CHLORIDE 0.9 % IV SOLN: 500.0000 mL | Freq: Once | INTRAVENOUS | Status: DC | PRN

## 2017-05-22 MED ORDER — FENTANYL CITRATE (PF) 100 MCG/2ML IJ SOLN: 50.0000 ug | Freq: Once | INTRAMUSCULAR | Status: DC

## 2017-05-22 MED ORDER — ATORVASTATIN CALCIUM 20 MG PO TABS
20.0000 mg | ORAL_TABLET | Freq: Every day | ORAL | Status: DC
  Administered 2017-05-22 – 2017-05-25 (×4): 20 mg via ORAL
  Filled 2017-05-22 (×4): qty 1

## 2017-05-22 MED ORDER — HEPARIN SODIUM (PORCINE) 1000 UNIT/ML IJ SOLN
INTRAMUSCULAR | Status: DC | PRN
  Administered 2017-05-22: 5000 [IU] via INTRAVENOUS

## 2017-05-22 MED ORDER — PHENOL 1.4 % MT LIQD: 1.0000 | OROMUCOSAL | Status: DC | PRN

## 2017-05-22 MED ORDER — MORPHINE SULFATE 2 MG/ML IV SOLN
INTRAVENOUS | Status: DC
  Administered 2017-05-22: 15:00:00 via INTRAVENOUS
  Administered 2017-05-22: 9 mg via INTRAVENOUS
  Administered 2017-05-22: 23:00:00 via INTRAVENOUS
  Administered 2017-05-22: 24 mg via INTRAVENOUS
  Administered 2017-05-22: 13.39 mg via INTRAVENOUS
  Administered 2017-05-23: 28.5 mg via INTRAVENOUS
  Administered 2017-05-23: 13.5 mg via INTRAVENOUS
  Administered 2017-05-23: 19.5 mg via INTRAVENOUS
  Administered 2017-05-23: 15 mg via INTRAVENOUS
  Administered 2017-05-23: 4.5 mg via INTRAVENOUS
  Administered 2017-05-24: 7.5 mg via INTRAVENOUS
  Administered 2017-05-24: 3 mg via INTRAVENOUS
  Administered 2017-05-24: 4.5 mg via INTRAVENOUS
  Administered 2017-05-24: 07:00:00 via INTRAVENOUS
  Filled 2017-05-22 (×2): qty 30
  Filled 2017-05-22: qty 25
  Filled 2017-05-22: qty 30
  Filled 2017-05-22: qty 25

## 2017-05-22 MED ORDER — ONDANSETRON HCL 4 MG/2ML IJ SOLN
INTRAMUSCULAR | Status: AC
  Filled 2017-05-22: qty 2

## 2017-05-22 MED ORDER — ONDANSETRON HCL 4 MG/2ML IJ SOLN: 4.0000 mg | Freq: Once | INTRAMUSCULAR | Status: DC | PRN

## 2017-05-22 MED ORDER — DIPHENHYDRAMINE HCL 12.5 MG/5ML PO ELIX
12.5000 mg | ORAL_SOLUTION | Freq: Four times a day (QID) | ORAL | Status: DC | PRN
  Filled 2017-05-22: qty 5

## 2017-05-22 MED ORDER — OXYCODONE HCL 5 MG PO TABS: 5.0000 mg | ORAL_TABLET | Freq: Once | ORAL | Status: DC | PRN

## 2017-05-22 MED ORDER — PROPOFOL 10 MG/ML IV BOLUS
INTRAVENOUS | Status: DC | PRN
  Administered 2017-05-22: 150 mg via INTRAVENOUS

## 2017-05-22 MED ORDER — DEXMEDETOMIDINE HCL 200 MCG/2ML IV SOLN
INTRAVENOUS | Status: DC | PRN
  Administered 2017-05-22 (×3): 4 ug via INTRAVENOUS
  Administered 2017-05-22: 8 ug via INTRAVENOUS
  Administered 2017-05-22: 4 ug via INTRAVENOUS
  Administered 2017-05-22: 8 ug via INTRAVENOUS
  Administered 2017-05-22 (×2): 4 ug via INTRAVENOUS
  Administered 2017-05-22: 8 ug via INTRAVENOUS
  Administered 2017-05-22 (×3): 4 ug via INTRAVENOUS

## 2017-05-22 MED ORDER — FENTANYL CITRATE (PF) 250 MCG/5ML IJ SOLN
INTRAMUSCULAR | Status: AC
  Filled 2017-05-22: qty 5

## 2017-05-22 MED ORDER — SUGAMMADEX SODIUM 200 MG/2ML IV SOLN
INTRAVENOUS | Status: DC | PRN
  Administered 2017-05-22: 200 mg via INTRAVENOUS

## 2017-05-22 MED ORDER — SUGAMMADEX SODIUM 200 MG/2ML IV SOLN
INTRAVENOUS | Status: AC
  Filled 2017-05-22: qty 2

## 2017-05-22 MED ORDER — PHENYLEPHRINE 40 MCG/ML (10ML) SYRINGE FOR IV PUSH (FOR BLOOD PRESSURE SUPPORT)
PREFILLED_SYRINGE | INTRAVENOUS | Status: DC | PRN
  Administered 2017-05-22: 120 ug via INTRAVENOUS
  Administered 2017-05-22 (×3): 40 ug via INTRAVENOUS

## 2017-05-22 MED ORDER — PANTOPRAZOLE SODIUM 40 MG PO TBEC
40.0000 mg | DELAYED_RELEASE_TABLET | Freq: Every day | ORAL | Status: DC
  Administered 2017-05-23 – 2017-05-26 (×4): 40 mg via ORAL
  Filled 2017-05-22 (×4): qty 1

## 2017-05-22 MED ORDER — SUCCINYLCHOLINE CHLORIDE 200 MG/10ML IV SOSY
PREFILLED_SYRINGE | INTRAVENOUS | Status: DC | PRN
  Administered 2017-05-22: 120 mg via INTRAVENOUS

## 2017-05-22 MED ORDER — MUPIROCIN 2 % EX OINT
1.0000 "application " | TOPICAL_OINTMENT | Freq: Once | CUTANEOUS | Status: AC
  Administered 2017-05-22: 1 via TOPICAL

## 2017-05-22 MED ORDER — LACTATED RINGERS IV SOLN
INTRAVENOUS | Status: DC
Start: 2017-05-22 — End: 2017-05-22
  Administered 2017-05-22 (×2): via INTRAVENOUS

## 2017-05-22 MED ORDER — LIDOCAINE 2% (20 MG/ML) 5 ML SYRINGE
INTRAMUSCULAR | Status: AC
  Filled 2017-05-22: qty 5

## 2017-05-22 MED ORDER — VANCOMYCIN HCL IN DEXTROSE 1-5 GM/200ML-% IV SOLN
1000.0000 mg | INTRAVENOUS | Status: AC
  Administered 2017-05-22: 1000 mg via INTRAVENOUS

## 2017-05-22 MED ORDER — CHLORHEXIDINE GLUCONATE 4 % EX LIQD
60.0000 mL | Freq: Once | CUTANEOUS | Status: DC
  Administered 2017-05-22: 4 via TOPICAL

## 2017-05-22 MED ORDER — LABETALOL HCL 5 MG/ML IV SOLN
10.0000 mg | INTRAVENOUS | Status: DC | PRN
  Administered 2017-05-22 (×2): 10 mg via INTRAVENOUS

## 2017-05-22 MED ORDER — ESMOLOL HCL 100 MG/10ML IV SOLN
INTRAVENOUS | Status: DC | PRN
  Administered 2017-05-22: 10 mg via INTRAVENOUS
  Administered 2017-05-22: 20 mg via INTRAVENOUS

## 2017-05-22 MED ORDER — FLUTICASONE FUROATE-VILANTEROL 200-25 MCG/INH IN AEPB
1.0000 | INHALATION_SPRAY | Freq: Every day | RESPIRATORY_TRACT | Status: DC
  Administered 2017-05-22 – 2017-05-26 (×5): 1 via RESPIRATORY_TRACT
  Filled 2017-05-22: qty 28

## 2017-05-22 MED ORDER — SODIUM CHLORIDE 0.9 % IV SOLN
INTRAVENOUS | Status: DC | PRN
  Administered 2017-05-22: 11:00:00 500 mL

## 2017-05-22 MED ORDER — FENTANYL CITRATE (PF) 100 MCG/2ML IJ SOLN
INTRAMUSCULAR | Status: DC | PRN
  Administered 2017-05-22 (×4): 50 ug via INTRAVENOUS

## 2017-05-22 MED ORDER — SODIUM CHLORIDE 0.9% FLUSH
9.0000 mL | INTRAVENOUS | Status: DC | PRN
Start: 2017-05-22 — End: 2017-05-24

## 2017-05-22 MED ORDER — EPHEDRINE SULFATE-NACL 50-0.9 MG/10ML-% IV SOSY
PREFILLED_SYRINGE | INTRAVENOUS | Status: DC | PRN
  Administered 2017-05-22 (×4): 5 mg via INTRAVENOUS

## 2017-05-22 MED ORDER — POLYETHYLENE GLYCOL 3350 17 G PO PACK
17.0000 g | PACK | Freq: Every day | ORAL | Status: DC | PRN
  Administered 2017-05-23: 17 g via ORAL
  Filled 2017-05-22: qty 1

## 2017-05-22 MED ORDER — SODIUM CHLORIDE 0.9 % IV SOLN
INTRAVENOUS | Status: AC
  Filled 2017-05-22: qty 1.2

## 2017-05-22 MED ORDER — EPHEDRINE 5 MG/ML INJ
INTRAVENOUS | Status: AC
  Filled 2017-05-22: qty 10

## 2017-05-22 MED ORDER — PROTAMINE SULFATE 10 MG/ML IV SOLN
INTRAVENOUS | Status: DC | PRN
  Administered 2017-05-22: 30 mg via INTRAVENOUS

## 2017-05-22 MED ORDER — HEMOSTATIC AGENTS (NO CHARGE) OPTIME
TOPICAL | Status: DC | PRN
  Administered 2017-05-22: 1 via TOPICAL

## 2017-05-22 MED ORDER — DEXAMETHASONE SODIUM PHOSPHATE 10 MG/ML IJ SOLN
INTRAMUSCULAR | Status: AC
  Filled 2017-05-22: qty 1

## 2017-05-22 MED ORDER — ALPRAZOLAM 0.5 MG PO TABS
1.0000 mg | ORAL_TABLET | Freq: Three times a day (TID) | ORAL | Status: DC
  Administered 2017-05-22 – 2017-05-26 (×13): 1 mg via ORAL
  Filled 2017-05-22 (×13): qty 2

## 2017-05-22 MED ORDER — SODIUM CHLORIDE 0.9 % IV SOLN
INTRAVENOUS | Status: DC
  Administered 2017-05-22 – 2017-05-24 (×2): via INTRAVENOUS

## 2017-05-22 SURGICAL SUPPLY — 61 items
ADH SKN CLS APL DERMABOND .7 (GAUZE/BANDAGES/DRESSINGS) ×2
AGENT HMST SPONGE THK3/8 (HEMOSTASIS) ×1
BANDAGE ESMARK 6X9 LF (GAUZE/BANDAGES/DRESSINGS) IMPLANT
BNDG CMPR 9X6 STRL LF SNTH (GAUZE/BANDAGES/DRESSINGS)
BNDG ESMARK 6X9 LF (GAUZE/BANDAGES/DRESSINGS)
CANISTER SUCT 3000ML PPV (MISCELLANEOUS) ×3 IMPLANT
CLIP FOGARTY SPRING 6M (CLIP) ×1 IMPLANT
CLIP VESOCCLUDE MED 24/CT (CLIP) ×3 IMPLANT
CLIP VESOCCLUDE SM WIDE 24/CT (CLIP) ×3 IMPLANT
COVER PROBE W GEL 5X96 (DRAPES) ×3 IMPLANT
CUFF TOURNIQUET SINGLE 24IN (TOURNIQUET CUFF) IMPLANT
CUFF TOURNIQUET SINGLE 34IN LL (TOURNIQUET CUFF) IMPLANT
CUFF TOURNIQUET SINGLE 44IN (TOURNIQUET CUFF) IMPLANT
DERMABOND ADVANCED (GAUZE/BANDAGES/DRESSINGS) ×4
DERMABOND ADVANCED .7 DNX12 (GAUZE/BANDAGES/DRESSINGS) ×1 IMPLANT
DRAIN CHANNEL 15F RND FF W/TCR (WOUND CARE) IMPLANT
DRAPE C-ARM 42X72 X-RAY (DRAPES) ×1 IMPLANT
DRAPE HALF SHEET 40X57 (DRAPES) ×2 IMPLANT
ELECT REM PT RETURN 9FT ADLT (ELECTROSURGICAL) ×3
ELECTRODE REM PT RTRN 9FT ADLT (ELECTROSURGICAL) ×1 IMPLANT
EVACUATOR SILICONE 100CC (DRAIN) IMPLANT
GLOVE BIO SURGEON STRL SZ7 (GLOVE) ×3 IMPLANT
GLOVE BIOGEL PI IND STRL 6.5 (GLOVE) IMPLANT
GLOVE BIOGEL PI IND STRL 7.5 (GLOVE) ×1 IMPLANT
GLOVE BIOGEL PI INDICATOR 6.5 (GLOVE) ×2
GLOVE BIOGEL PI INDICATOR 7.5 (GLOVE) ×2
GLOVE SURG SS PI 6.0 STRL IVOR (GLOVE) ×2 IMPLANT
GOWN STRL REUS W/ TWL LRG LVL3 (GOWN DISPOSABLE) ×3 IMPLANT
GOWN STRL REUS W/TWL LRG LVL3 (GOWN DISPOSABLE) ×15
GRAFT PROPATEN THIN WALL 6X40 (Vascular Products) ×2 IMPLANT
HEMOSTAT SPONGE AVITENE ULTRA (HEMOSTASIS) ×2 IMPLANT
INSERT FOGARTY SM (MISCELLANEOUS) IMPLANT
KIT BASIN OR (CUSTOM PROCEDURE TRAY) ×3 IMPLANT
KIT TURNOVER KIT B (KITS) ×3 IMPLANT
MARKER GRAFT CORONARY BYPASS (MISCELLANEOUS) IMPLANT
NS IRRIG 1000ML POUR BTL (IV SOLUTION) ×6 IMPLANT
PACK PERIPHERAL VASCULAR (CUSTOM PROCEDURE TRAY) ×3 IMPLANT
PAD ARMBOARD 7.5X6 YLW CONV (MISCELLANEOUS) ×6 IMPLANT
SET MICROPUNCTURE 5F STIFF (MISCELLANEOUS) IMPLANT
SPONGE LAP 18X18 X RAY DECT (DISPOSABLE) ×2 IMPLANT
STOPCOCK 4 WAY LG BORE MALE ST (IV SETS) IMPLANT
SUT ETHILON 3 0 PS 1 (SUTURE) IMPLANT
SUT GORETEX 5 0 TT13 24 (SUTURE) IMPLANT
SUT GORETEX 6.0 TT13 (SUTURE) ×6 IMPLANT
SUT MNCRL AB 4-0 PS2 18 (SUTURE) ×7 IMPLANT
SUT PROLENE 5 0 C 1 24 (SUTURE) ×1 IMPLANT
SUT PROLENE 6 0 BV (SUTURE) ×3 IMPLANT
SUT PROLENE 7 0 BV 1 (SUTURE) IMPLANT
SUT SILK 2 0 PERMA HAND 18 BK (SUTURE) IMPLANT
SUT SILK 3 0 (SUTURE)
SUT SILK 3-0 18XBRD TIE 12 (SUTURE) IMPLANT
SUT VIC AB 2-0 CT1 27 (SUTURE) ×6
SUT VIC AB 2-0 CT1 TAPERPNT 27 (SUTURE) ×2 IMPLANT
SUT VIC AB 3-0 SH 27 (SUTURE) ×6
SUT VIC AB 3-0 SH 27X BRD (SUTURE) ×3 IMPLANT
TAPE UMBILICAL COTTON 1/8X30 (MISCELLANEOUS) IMPLANT
TOWEL GREEN STERILE (TOWEL DISPOSABLE) ×5 IMPLANT
TRAY FOLEY MTR SLVR 16FR STAT (SET/KITS/TRAYS/PACK) ×1 IMPLANT
TUBING EXTENTION W/L.L. (IV SETS) IMPLANT
UNDERPAD 30X30 (UNDERPADS AND DIAPERS) ×3 IMPLANT
WATER STERILE IRR 1000ML POUR (IV SOLUTION) ×3 IMPLANT

## 2017-05-22 NOTE — Anesthesia Procedure Notes (Signed)
Arterial Line Insertion Start/End4/10/2017 9:00 AM, 05/22/2017 9:20 AM Performed by: Josephine Igo, CRNA, CRNA  Patient location: Pre-op. Preanesthetic checklist: patient identified, IV checked, risks and benefits discussed and surgical consent Lidocaine 1% used for infiltration Left, radial was placed Catheter size: 20 G Hand hygiene performed , maximum sterile barriers used  and Seldinger technique used  Attempts: 1 Procedure performed without using ultrasound guided technique. Following insertion, dressing applied and Biopatch. Post procedure assessment: normal  Patient tolerated the procedure well with no immediate complications.

## 2017-05-22 NOTE — Interval H&P Note (Signed)
   History and Physical Update  The patient was interviewed and re-examined.  The patient's previous History and Physical has been reviewed and is unchanged from my consult except for: re-evaluation in ED for AMS felt due to overmedication.  There is no change in the plan of care: L CFA to AK pop BPG w/ Propaten.   The risk, benefits, and alternative for bypass operations were discussed with the patient.    The patient is aware the risks include but are not limited to: bleeding, infection, myocardial infarction, stroke, limb loss, nerve damage, limb edema, need for additional procedures in the future, wound complications, and inability to complete the bypass.   The patient is aware of these risks and agreed to proceed.   Adele Barthel, MD, FACS [facility] [place] [facility] [place] Office: [phone] Pager: [phone]  05/22/2017, 8:08 AM

## 2017-05-22 NOTE — Anesthesia Preprocedure Evaluation (Signed)
Anesthesia Evaluation  Patient identified by MRN, date of birth, ID band Patient awake    Reviewed: Allergy & Precautions, NPO status , Patient's Chart, lab work & pertinent test results  Airway Mallampati: II  TM Distance: >3 FB     Dental  (+) Edentulous Upper, Edentulous Lower   Pulmonary Current Smoker,     + decreased breath sounds      Cardiovascular hypertension,  Rhythm:Regular Rate:Normal     Neuro/Psych    GI/Hepatic   Endo/Other  diabetes  Renal/GU      Musculoskeletal   Abdominal   Peds  Hematology   Anesthesia Other Findings   Reproductive/Obstetrics                             Anesthesia Physical Anesthesia Plan  ASA: III  Anesthesia Plan: General   Post-op Pain Management:    Induction: Intravenous  PONV Risk Score and Plan: Ondansetron and Dexamethasone  Airway Management Planned: Oral ETT  Additional Equipment: Arterial line  Intra-op Plan:   Post-operative Plan: Possible Post-op intubation/ventilation  Informed Consent: I have reviewed the patients History and Physical, chart, labs and discussed the procedure including the risks, benefits and alternatives for the proposed anesthesia with the patient or authorized representative who has indicated his/her understanding and acceptance.     Plan Discussed with: CRNA and Anesthesiologist  Anesthesia Plan Comments:         Anesthesia Quick Evaluation

## 2017-05-22 NOTE — Anesthesia Postprocedure Evaluation (Signed)
Anesthesia Post Note  Patient: [name]  Procedure(s) Performed: BYPASS GRAFT LEFT COMMON  FEMORAL-ABOVE THE KNEE POPLITEAL ARTERY USING 6 x 40 GORE PROPATEN VASCULAR GRAFT (Left Groin)     Patient location during evaluation: PACU Anesthesia Type: General Level of consciousness: awake and alert Pain management: pain level controlled Vital Signs Assessment: post-procedure vital signs reviewed and stable Respiratory status: spontaneous breathing, nonlabored ventilation, respiratory function stable and patient connected to nasal cannula oxygen Cardiovascular status: blood pressure returned to baseline and stable Postop Assessment: no apparent nausea or vomiting Anesthetic complications: no    Last Vitals:  Vitals:   05/22/17 2000 05/22/17 2052  BP:  (!) 91/55  Pulse:  (!) 57  Resp: 13 15  Temp:  36.6 C  SpO2: 94% 95%    Last Pain:  Vitals:   05/22/17 2052  TempSrc: Oral  PainSc:                  [name] COKER

## 2017-05-22 NOTE — Progress Notes (Signed)
Care of pt assumed by MA Kaylise Blakeley RN 

## 2017-05-22 NOTE — Transfer of Care (Signed)
Immediate Anesthesia Transfer of Care Note  Patient: [name]  Procedure(s) Performed: BYPASS GRAFT LEFT COMMON  FEMORAL-ABOVE THE KNEE POPLITEAL ARTERY USING 6 x 40 GORE PROPATEN VASCULAR GRAFT (Left Groin)  Patient Location: PACU  Anesthesia Type:General  Level of Consciousness: awake and drowsy  Airway & Oxygen Therapy: Patient Spontanous Breathing and Patient connected to face mask oxygen  Post-op Assessment: Report given to RN and Post -op Vital signs reviewed and stable  Post vital signs: Reviewed and stable  Last Vitals:  Vitals Value Taken Time  BP 151/84 05/22/2017  1:30 PM  Temp    Pulse 103 05/22/2017  1:36 PM  Resp 25 05/22/2017  1:36 PM  SpO2 94 % 05/22/2017  1:36 PM  Vitals shown include unvalidated device data.  Last Pain:  Vitals:   05/22/17 1324  TempSrc:   PainSc: (P) Asleep      Patients Stated Pain Goal: 3 (49/82/64 1583)  Complications: No apparent anesthesia complications

## 2017-05-22 NOTE — Op Note (Signed)
OPERATIVE NOTE   PROCEDURE: 1. 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  Left common femoral artery with <30% posterior plaque 2.  Above-the-knee popliteal artery (4 mm) with somewhat thickened wall with some posterior plaque 3.  Dopplerable left anterior tibial artery and posterior tibial artery: disappears with graft compression  SPECIMEN(S):  none  INDICATIONS:   [name] is a 62 y.o. 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  This patient's pain has character suggestive of rest pain but the extensive and rapid profunda femoral artery collaterals suggest possible secondary etiologies for this patient's pain.  I offered her a left femoropopliteal bypass to address the left superficial femoral artery occlusion.  The risk, benefits, and alternative for bypass operations were discussed with the patient.  The patient is aware the risks include but are not limited to: bleeding, infection, myocardial infarction, stroke, limb loss, nerve damage, need for additional procedures in the future, wound complications, and inability to complete the bypass.  The patient is aware of these risks and agreed to proceed.  DESCRIPTION: After full informed written consent was obtained, the patient was brought back to the operating room and placed supine upon the operating table.  Prior to induction, the patient was given intravenous antibiotics.  After obtaining adequate anesthesia, the patient was prepped and draped in the standard fashion for a femoral to popliteal bypass operation.  Attention was turned to the left groin.   An oblique incision was made over the left common femoral artery.  Using blunt dissection and electrocautery, the artery was dissected out from the inguinal ligament down to the femoral bifurcation.  The common femoral artery was dissected out and a vessel loop applied.  This common femoral artery was found on exam to be minimally calcified with likely posterior plaque.   At this point, attention was turned to the distal thigh.  An longitudinal incision was made over Hunter's canal.  Using blunt dissection and electrocautery, a plane was developed through the subcutaneous tissue and fascia down to Hunter's canal.  The above-the-knee popliteal artery was dissected away from its adjacent veins.  This popliteal artery was found on exam to be soft and an adequate target.    This patient's greater saphenous vein was found to be inadequate on preoperative vein mapping.  Intraoperative Sonosite findings confirms this.    At this point, I tunneled from the above-the-knee popliteal exposure to the right groin with a Gore tunneler.  The 6 mm Propaten conduit was sewn to the inner cannula and passed through the tunnel, taking care to maintain the orientation of the conduit.  The distal end of the conduit was clamped.    The patient was given 5000 units of Heparin intravenously, which was a therapeutic bolus.   After waiting 3 minutes, the common femoral artery was clamped proximally and distally.  I made an arteriotomy in the left common femoral artery.  The artery was found to be: soft anterior with a limited posterior plaque occupying 30% of the lumen.  The proximal extent of the Propaten graft was spatulated to the geometry of the arteriotomy.  The conduit was sewn to the common femoral  artery in an end-to-side configuration with a running stitch of CV-6.  I completed the anastomosis in the usual fashion.  I placed Avitene in the wound, and then turned my attention to the distal end of the graft.    I released the  clamp and pulsatile bleeding from the graft was evident.  I reclamped the conduit near the arterial anastomosis.  The conduit was irrigated with heparinized saline and suctioned out.  The above-the-knee popliteal artery was then placed under tension proximally and distally with vessel loops.  An arteriotomy was made with a 11-blade and were extended proximally and distally with a Pott's scissor.  This above-the-knee popliteal artery was 4 mm in diameter with a somewhat thickened wall.  There was a limited posterior plaque which I did not thick was flow limiting.  I could easily pass a 4 mm dilator.    The distal end of the conduit was spatulated, shortening the conduit in the process.  The conduit was sewn to the to the artery in an end-to-side configuration with a running stitch of CV-6.  Prior to completing the anastomosis, I backbled both end of the artery.  There was: no clots evident.  The conduit was allowed to bleed in an antegrade fashion.  There was: no clots evident and pulsatile bleeding present.  I placed Avitene in the wound.    The continuous doppler was used to interrogate the above-the-knee popliteal artery: biphasic signal which became monophasic with graft occlusion.  Dopplerable posterior tibial artery and anterior tibial artery signals that disappear with graft compression.  At this point, all wound were washed out.  Bleeding points were controlled with electrocautery, and suture repair of active bleeding points in the suture line.  No further bleeding was present.    The above-the-knee exposure was repaired with a layer of 2-0 Vicryl in the fascia, a single layer of 3-0 Vicryl, and a running subcuticular of 4-0 Monocryl in the skin layer.    In a similar fashion, the groin was repaired with a double layer of 2-0 Vicryl, a double layer of 3-0 Vicryl, and a running subcuticular of 4-0 Monocryl in the skin layer.    All skin incisions were cleaned, dried, and reinforced with Dermabond.   A sterile dressing was applied to each incision.   COMPLICATIONS: none  CONDITION: stable   Adele Barthel, MD, [facility] [place] Office: [phone] Pager: [phone]  05/22/2017, 1:10 PM

## 2017-05-22 NOTE — Progress Notes (Signed)
Patient arrived fro PACU. A line set up, Vital signs obtained. Patient with PCA pump. Patient with dry mouth, patient swabbed with mouth wash and moisturizer, Patient call bell within reach. Will monitor patient. Zyler Hyson, Bettina Gavia RN

## 2017-05-22 NOTE — Anesthesia Procedure Notes (Signed)
Procedure Name: Intubation Date/Time: 05/22/2017 10:29 AM Performed by: Gwyndolyn Saxon, CRNA Pre-anesthesia Checklist: Patient identified, Emergency Drugs available, Suction available, Patient being monitored and Timeout performed Patient Re-evaluated:Patient Re-evaluated prior to induction Oxygen Delivery Method: Circle system utilized Preoxygenation: Pre-oxygenation with 100% oxygen Induction Type: IV induction, Rapid sequence and Cricoid Pressure applied Laryngoscope Size: Miller and 2 Grade View: Grade I Tube type: Oral Tube size: 7.0 mm Number of attempts: 1 Placement Confirmation: ETT inserted through vocal cords under direct vision,  positive ETCO2,  CO2 detector and breath sounds checked- equal and bilateral Secured at: 21 cm Tube secured with: Tape Dental Injury: Teeth and Oropharynx as per pre-operative assessment

## 2017-05-22 NOTE — Progress Notes (Signed)
   Daily Progress Note  During my pre-operative consultation, I reiterated again that I was not convinced that her left leg pain is completely due to her right superficial femoral artery occlusion as she has extensive rapid profunda femoral artery collateralization.  Her left leg pain relief is unlikely to be complete after the bypass.  I reiterated this to the patient and husband repeatedly.   We discussed also adjustment of her anxiolytic and narcotic regimen was going to be necessary in the post-operative period given her repeat episodes of ED visits with AMS due to medication interactions.   Adele Barthel, MD, FACS [facility] [place] Office: [phone] Pager: [phone]  05/22/2017, 10:03 AM

## 2017-05-23 ENCOUNTER — Inpatient Hospital Stay (HOSPITAL_COMMUNITY): Payer: Medicare Other

## 2017-05-23 ENCOUNTER — Other Ambulatory Visit: Payer: Self-pay

## 2017-05-23 ENCOUNTER — Encounter (HOSPITAL_COMMUNITY): Payer: Self-pay | Admitting: General Practice

## 2017-05-23 DIAGNOSIS — Z9889 Other specified postprocedural states: Secondary | ICD-10-CM

## 2017-05-23 LAB — CBC
HCT: 34.4 % — ABNORMAL LOW (ref 36.0–46.0)
HEMOGLOBIN: 10.9 g/dL — AB (ref 12.0–15.0)
MCH: 29.3 pg (ref 26.0–34.0)
MCHC: 31.7 g/dL (ref 30.0–36.0)
MCV: 92.5 fL (ref 78.0–100.0)
Platelets: 157 10*3/uL (ref 150–400)
RBC: 3.72 MIL/uL — AB (ref 3.87–5.11)
RDW: 13.7 % (ref 11.5–15.5)
WBC: 10.4 10*3/uL (ref 4.0–10.5)

## 2017-05-23 LAB — GLUCOSE, CAPILLARY
GLUCOSE-CAPILLARY: 135 mg/dL — AB (ref 65–99)
GLUCOSE-CAPILLARY: 176 mg/dL — AB (ref 65–99)
Glucose-Capillary: 133 mg/dL — ABNORMAL HIGH (ref 65–99)
Glucose-Capillary: 111 mg/dL — ABNORMAL HIGH (ref 65–99)

## 2017-05-23 LAB — BASIC METABOLIC PANEL
ANION GAP: 10 (ref 5–15)
BUN: 7 mg/dL (ref 6–20)
CHLORIDE: 101 mmol/L (ref 101–111)
CO2: 27 mmol/L (ref 22–32)
Calcium: 8.3 mg/dL — ABNORMAL LOW (ref 8.9–10.3)
Creatinine, Ser: 0.6 mg/dL (ref 0.44–1.00)
GFR calc Af Amer: >60 mL/min (ref >60)
GFR calc non Af Amer: >60 mL/min (ref >60)
Glucose, Bld: 147 mg/dL — ABNORMAL HIGH (ref 65–99)
POTASSIUM: 3.1 mmol/L — AB (ref 3.5–5.1)
SODIUM: 138 mmol/L (ref 135–145)

## 2017-05-23 LAB — HEMOGLOBIN A1C
HEMOGLOBIN A1C: 6.1 % — AB (ref 4.8–5.6)
MEAN PLASMA GLUCOSE: 128.37 mg/dL

## 2017-05-23 LAB — ABO/RH: ABO/RH(D): A POS

## 2017-05-23 MED ORDER — NICOTINE 21 MG/24HR TD PT24
21.0000 mg | MEDICATED_PATCH | Freq: Every day | TRANSDERMAL | Status: DC
  Administered 2017-05-23 – 2017-05-26 (×4): 21 mg via TRANSDERMAL
  Filled 2017-05-23 (×5): qty 1

## 2017-05-23 MED ORDER — VORTIOXETINE HBR 20 MG PO TABS
20.0000 mg | ORAL_TABLET | Freq: Every day | ORAL | Status: DC
  Administered 2017-05-23 – 2017-05-26 (×4): 20 mg via ORAL
  Filled 2017-05-23 (×4): qty 1

## 2017-05-23 NOTE — Evaluation (Signed)
Occupational Therapy Evaluation Patient Details Name: [name] MRN: [id number] DOB: [birth year] Today's Date: 05/23/2017    History of Present Illness Pt. is a 62 y.o. F with significant PMH of HTN, CHF, PAD, COPD, and DM. 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 S/p left common femoral artery to below-the-knee popliteal artery bypass with Propaten.   Clinical Impression   PTA Pt reports that she was independent in ADL and granddaughter assisted with IADL - she used a RW for mobility. Pt is currently min A for SPT with RW and while she can bring her left foot cross her knees to access her feet for dressing, she requires mod A for LB ADL with a sit <>stand component. Pt very lethargic (suspect medication) during session and she will require skilled OT in the acute setting and afterwards at the SNF level to maximize safety and independence in ADL and functional transfers. Next session to focus on transfers and standing balance at the sink during grooming    Follow Up Recommendations  SNF;Supervision/Assistance - 24 hour    Equipment Recommendations  3 in 1 bedside commode;Other (comment)(defer to next venue)    Recommendations for Other Services       Precautions / Restrictions Precautions Precautions: Fall Restrictions Weight Bearing Restrictions: No      Mobility Bed Mobility               General bed mobility comments: Pt OOB in recliner when OT entered room  Transfers Overall transfer level: Needs assistance Equipment used: Rolling walker (2 wheeled) Transfers: Sit to/from Stand Sit to Stand: Min assist         General transfer comment: Min assist for transfers from recliner and BSC to RW. Tactile/verbal cueing for hip extension    Balance Overall balance assessment: Needs assistance Sitting-balance support: No upper extremity supported;Feet supported Sitting balance-Leahy Scale: Good      Standing balance support: Bilateral upper extremity supported Standing balance-Leahy Scale: Poor Standing balance comment: reliant on BUE support                           ADL either performed or assessed with clinical judgement   ADL Overall ADL's : Needs assistance/impaired Eating/Feeding: Modified independent;Sitting Eating/Feeding Details (indicate cue type and reason): eating dinner using utensils at the end of session without any problems Grooming: Set up Grooming Details (indicate cue type and reason): able to bring hands up and touch back of head Upper Body Bathing: Min guard   Lower Body Bathing: Maximal assistance   Upper Body Dressing : Set up   Lower Body Dressing: Maximal assistance;Sit to/from stand   Toilet Transfer: Minimal assistance   Toileting- Clothing Manipulation and Hygiene: Maximal assistance       Functional mobility during ADLs: Minimal assistance;Rolling walker(SPT only) General ADL Comments: Pt able to bring feet across to knees to don/doff sock on LLE     Vision         Perception     Praxis      Pertinent Vitals/Pain Pain Assessment: 0-10 Pain Score: 8  Pain Location: LLE (thigh specifically) Pain Descriptors / Indicators: Discomfort;Grimacing Pain Intervention(s): Limited activity within patient's tolerance;Monitored during session;PCA encouraged     Hand Dominance Right   Extremity/Trunk Assessment Upper Extremity Assessment Upper Extremity Assessment: Generalized weakness;Difficult to assess due to impaired cognition   Lower Extremity Assessment Lower Extremity Assessment: Defer to PT evaluation  Cervical / Trunk Assessment Cervical / Trunk Assessment: Kyphotic   Communication Communication Communication: No difficulties   Cognition Arousal/Alertness: Lethargic Behavior During Therapy: WFL for tasks assessed/performed Overall Cognitive Status: Impaired/Different from baseline Area of Impairment:  Awareness;Memory                     Memory: Decreased short-term memory     Awareness: Emergent   General Comments: slow processing speed   General Comments       Exercises     Shoulder Instructions      Home Living Family/patient expects to be discharged to:: Private residence Living Arrangements: Other relatives(Granddaughter) Available Help at Discharge: Family Type of Home: Mobile home Home Access: Stairs to enter Technical brewer of Steps: 2 Entrance Stairs-Rails: None Home Layout: One level     Bathroom Shower/Tub: Teacher, early years/pre: Standard     Home Equipment: Environmental consultant - 2 wheels   Additional Comments: Pt very lethargic throughout session - answers "yes" to multiple answers      Prior Functioning/Environment Level of Independence: Needs assistance  Gait / Transfers Assistance Needed: Uses RW ADL's / Homemaking Assistance Needed: granddaughter performs IADLS   Comments: Patient very unclear regarding level of function prior to admission. Answers were inconsistent        OT Problem List: Decreased strength;Decreased range of motion;Decreased activity tolerance;Impaired balance (sitting and/or standing);Decreased cognition;Decreased safety awareness;Decreased knowledge of use of DME or AE;Pain      OT Treatment/Interventions: Self-care/ADL training;Energy conservation;DME and/or AE instruction;Therapeutic activities;Patient/family education;Balance training    OT Goals(Current goals can be found in the care plan section) Acute Rehab OT Goals Patient Stated Goal: get home OT Goal Formulation: With patient Time For Goal Achievement: 06/06/17 Potential to Achieve Goals: Good ADL Goals Pt Will Perform Grooming: with supervision;standing Pt Will Transfer to Toilet: with modified independence;stand pivot transfer;bedside commode Pt Will Perform Toileting - Clothing Manipulation and hygiene: with modified independence;sit to/from  stand  OT Frequency: Min 2X/week   Barriers to D/C:            Co-evaluation              AM-PAC PT "6 Clicks" Daily Activity     Outcome Measure Help from another person eating meals?: None Help from another person taking care of personal grooming?: A Little Help from another person toileting, which includes using toliet, bedpan, or urinal?: A Little Help from another person bathing (including washing, rinsing, drying)?: A Lot Help from another person to put on and taking off regular upper body clothing?: A Little Help from another person to put on and taking off regular lower body clothing?: A Lot 6 Click Score: 17   End of Session Equipment Utilized During Treatment: Gait belt;Rolling walker;Oxygen Nurse Communication: Mobility status  Activity Tolerance: Patient limited by lethargy Patient left: in chair;with call bell/phone within reach  OT Visit Diagnosis: Other abnormalities of gait and mobility (R26.89);Unsteadiness on feet (R26.81);Other symptoms and signs involving cognitive function;Pain Pain - Right/Left: Left Pain - part of body: Leg                Time: 0964-3838 OT Time Calculation (min): 22 min Charges:  OT General Charges $OT Visit: 1 Visit OT Evaluation $OT Eval Moderate Complexity: 1 Mod G-Codes:     Hulda Humphrey OTR/L [place] 05/23/2017, 5:08 PM

## 2017-05-23 NOTE — Evaluation (Signed)
Physical Therapy Evaluation Patient Details Name: [name] MRN: [id number] DOB: [birth year] Today's Date: 05/23/2017   History of Present Illness  Pt. is a 62 y.o. F with significant PMH of HTN, CHF, PAD, COPD, and DM. 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 S/p left common femoral artery to below-the-knee popliteal artery bypass with Propaten.  Clinical Impression  Pt admitted with above diagnosis. Pt currently with functional limitations due to the deficits listed below (see PT Problem List). Patient very lethargic secondary to pain medication during session and difficult to obtain an adequate history/prior level of function. Patient requiring min assist for transfers and ambulation up to 5 feet with RW at this time. Increased cueing and redirecting attention to task needed for initiation of movement. Recommending SNF at this time due to patient deficits of pain, decreased balance, endurance, and generalized weakness. Noted that patient declined SNF on recent previous admission despite recommendation. Pt will benefit from skilled PT to increase their independence and safety with mobility to allow discharge to the venue listed below.       Follow Up Recommendations SNF    Equipment Recommendations  None recommended by PT    Recommendations for Other Services       Precautions / Restrictions Precautions Precautions: Fall Restrictions Weight Bearing Restrictions: No      Mobility  Bed Mobility Overal bed mobility: Needs Assistance Bed Mobility: Supine to Sit     Supine to sit: Supervision     General bed mobility comments: Supervision for supine to sit with HOB elevated and increased time  Transfers Overall transfer level: Needs assistance Equipment used: Rolling walker (2 wheeled) Transfers: Sit to/from Stand Sit to Stand: Min assist         General transfer comment: Min assist for transfers from bed and  BSC to RW. Tactile/verbal cueing for hip extension  Ambulation/Gait Ambulation/Gait assistance: Min assist Ambulation Distance (Feet): 5 Feet Assistive device: Rolling walker (2 wheeled) Gait Pattern/deviations: Step-to pattern;Decreased stride length;Trunk flexed Gait velocity: decreased Gait velocity interpretation: Below normal speed for age/gender General Gait Details: Patient requiring min assist for gait with RW from bed to [facility] and BSC to recliner. Requires physical assistance with RW for turning and maintaining four points of contact. Increased difficulty with elevating LLE and frequent verbal cueing provided for initiating movement. very slow.   Stairs            Wheelchair Mobility    Modified Rankin (Stroke Patients Only)       Balance Overall balance assessment: Needs assistance Sitting-balance support: No upper extremity supported;Feet supported Sitting balance-Leahy Scale: Good     Standing balance support: Bilateral upper extremity supported Standing balance-Leahy Scale: Poor Standing balance comment: reliant on BUE support                             Pertinent Vitals/Pain Pain Assessment: 0-10 Pain Score: 8  Pain Location: LLE Pain Descriptors / Indicators: Discomfort;Grimacing Pain Intervention(s): Limited activity within patient's tolerance;Monitored during session;Other (comment);Patient requesting pain meds-RN notified(PCA empty - RN refilled)    Home Living Family/patient expects to be discharged to:: Private residence Living Arrangements: Other relatives(Granddaughter) Available Help at Discharge: Family Type of Home: Mobile home Home Access: Stairs to enter Entrance Stairs-Rails: None Entrance Stairs-Number of Steps: 2 Home Layout: One level Home Equipment: Walker - 2 wheels      Prior Function Level of Independence:  Needs assistance   Gait / Transfers Assistance Needed: Uses RW  ADL's / Homemaking Assistance Needed:  granddaughter performs IADLS  Comments: Patient very unclear regarding level of function prior to admission. Stating home was "not going good."     Hand Dominance        Extremity/Trunk Assessment   Upper Extremity Assessment Upper Extremity Assessment: Defer to OT evaluation    Lower Extremity Assessment Lower Extremity Assessment: Generalized weakness    Cervical / Trunk Assessment Cervical / Trunk Assessment: Kyphotic  Communication   Communication: No difficulties  Cognition Arousal/Alertness: Lethargic Behavior During Therapy: WFL for tasks assessed/performed Overall Cognitive Status: Impaired/Different from baseline Area of Impairment: Awareness;Memory                     Memory: Decreased short-term memory     Awareness: Emergent   General Comments: slow processing speed      General Comments General comments (skin integrity, edema, etc.): Patient friends present during session. Patient is on oxygen at home; SpO2 remained > 95% during session.     Exercises     Assessment/Plan    PT Assessment Patient needs continued PT services  PT Problem List Decreased strength;Decreased activity tolerance;Decreased balance;Decreased mobility;Decreased safety awareness;Pain       PT Treatment Interventions DME instruction;Gait training;Stair training;Functional mobility training;Therapeutic activities;Therapeutic exercise;Balance training;Patient/family education    PT Goals (Current goals can be found in the Care Plan section)  Acute Rehab PT Goals Patient Stated Goal: none stated    Frequency Min 2X/week   Barriers to discharge        Co-evaluation               AM-PAC PT "6 Clicks" Daily Activity  Outcome Measure Difficulty turning over in bed (including adjusting bedclothes, sheets and blankets)?: A Little Difficulty moving from lying on back to sitting on the side of the bed? : A Lot Difficulty sitting down on and standing up from a  chair with arms (e.g., wheelchair, bedside commode, etc,.)?: Unable Help needed moving to and from a bed to chair (including a wheelchair)?: A Little Help needed walking in hospital room?: A Little Help needed climbing 3-5 steps with a railing? : A Lot 6 Click Score: 14    End of Session Equipment Utilized During Treatment: Gait belt;Oxygen Activity Tolerance: Patient limited by fatigue Patient left: in chair;with call bell/phone within reach;with chair alarm set;with family/visitor present Nurse Communication: Mobility status PT Visit Diagnosis: Unsteadiness on feet (R26.81);Muscle weakness (generalized) (M62.81);History of falling (Z91.81);Pain Pain - Right/Left: Left Pain - part of body: Leg    Time: 9628-3662 PT Time Calculation (min) (ACUTE ONLY): 26 min   Charges:   PT Evaluation $PT Eval Moderate Complexity: 1 Mod PT Treatments $Therapeutic Activity: 8-22 mins   PT G Codes:       Ellamae Sia, PT, DPT Acute Rehabilitation Services  Pager: [place] 05/23/2017, 12:40 PM

## 2017-05-23 NOTE — Progress Notes (Signed)
ABI's have been completed. Right 1.01 Left 1.02  05/23/17 9:19 AM Carlos Levering RVT

## 2017-05-23 NOTE — Progress Notes (Addendum)
  Progress Note    05/23/2017 7:13 AM 1 Day Post-Op  Subjective:  "my leg hurts"   Vitals:   05/23/17 0427 05/23/17 0618  BP:  132/64  Pulse:  (!) 58  Resp: 20 19  Temp:  97.8 F (36.6 C)  SpO2: 96% 96%   Physical Exam: Cardiac:  RRR Lungs:  Non labored Incisions:  L groin incision soft, no palpable hematoma, no drainage; L medial distal thigh incision soft, no hematoma or drainage Extremities:  Palpable L ATA Abdomen:  Soft Neurologic: A&O  CBC    Component Value Date/Time   WBC 10.4 05/23/2017 0505   RBC 3.72 (L) 05/23/2017 0505   HGB 10.9 (L) 05/23/2017 0505   HCT 34.4 (L) 05/23/2017 0505   PLT 157 05/23/2017 0505   MCV 92.5 05/23/2017 0505   MCH 29.3 05/23/2017 0505   MCHC 31.7 05/23/2017 0505   RDW 13.7 05/23/2017 0505   LYMPHSABS 3.6 05/16/2017 1450   MONOABS 0.4 05/16/2017 1450   EOSABS 0.2 05/16/2017 1450   BASOSABS 0.0 05/16/2017 1450    BMET    Component Value Date/Time   NA 139 05/16/2017 1450   K 3.3 (L) 05/16/2017 1450   CL 100 (L) 05/16/2017 1450   CO2 28 05/16/2017 1450   GLUCOSE 210 (H) 05/16/2017 1450   BUN 7 05/16/2017 1450   CREATININE 0.73 05/16/2017 1450   CALCIUM 9.2 05/16/2017 1450   GFRNONAA >60 05/16/2017 1450   GFRAA >60 05/16/2017 1450    INR    Component Value Date/Time   INR 0.97 05/22/2017 0820     Intake/Output Summary (Last 24 hours) at 05/23/2017 0713 Last data filed at 05/23/2017 5035 Gross per 24 hour  Intake 2386.67 ml  Output 1100 ml  Net 1286.67 ml     Assessment/Plan:  62 y.o. female is s/p L fem- AK pop bypass with PTFE 1 Day Post-Op   Patent bypass with palpable L ATA PT/OT to evaluate today PCA morphine 1 more day Dispo: pending therapy recommendations  DVT prophylaxis:  subq heparin   Dagoberto Ligas, PA-C [facility] [phone] 05/23/2017 7:13 AM   Addendum  I have independently interviewed and examined the patient, and I agree with the physician assistant's  findings.  Pulse exam substantially improve.  Can't tell if patient's preop pain is gone as she is currently complaining about her surgical pain.  Again, we had discussed preop that I felt that she likely had a secondary etiology for the majority of her pain.  I suspect pt will need SNF given the extensive deconditioning in this patient.  - Pt requested nicotine patch - Pt baseline on oxygen at home, so likely will continue to be oxygen dependent   Adele Barthel, MD, FACS [facility] [place]: [phone] Pager: [phone]  05/23/2017, 8:07 AM  Addendum  Non-invasive Vascular Imaging   ABI (05/23/2017)  R:   ABI: 1.01 (0.80),   PT: tri  DP: tri  TBI:  0.33  L:   ABI: 1.02 (0.55),   PT: mono  DP: mono  TBI: 0.52  ABI demonstrate clear augmentation of the blood flow in the left leg.  The monophasic waveforms however demonstrate the patient's baseline tibial artery disease despite the augmentation.   Adele Barthel, MD, FACS [facility] [place] Office: [phone] Pager: [phone]  05/23/2017, 10:26 AM

## 2017-05-23 NOTE — Care Management Note (Addendum)
Case Management Note Marvetta Gibbons RN, BSN Unit 4E-Case Manager [phone]  Patient Details  Name: [name] MRN: [id number] Date of Birth: 1955-07-11  Subjective/Objective:  Pt admitted s/p  L fem- AK pop bypass with PTFE                  Action/Plan: PTA pt lived at home has home 02 baseline of 3L- notified by Jonelle Sidle with [facility] pt has pre-op referral for any HH needs- PT/OT evals- recommendation for STSNF- CSW to see pt for placement needs.  CM will f/u for transition plan  Expected Discharge Date:                  Expected Discharge Plan:  [place]  In-House Referral:  Clinical Social Work  Discharge planning Services  CM Consult  Post Acute Care Choice:    Choice offered to:     DME Arranged:    DME Agency:     HH Arranged:    [name] Agency:  [facility]  Status of Service:  In process, will continue to follow  If discussed at Long Length of Stay Meetings, dates discussed:    Discharge Disposition:   Additional Comments:  Dawayne Patricia, RN 05/23/2017, 3:30 PM

## 2017-05-24 ENCOUNTER — Inpatient Hospital Stay (HOSPITAL_COMMUNITY): Payer: Medicare Other

## 2017-05-24 LAB — GLUCOSE, CAPILLARY
GLUCOSE-CAPILLARY: 175 mg/dL — AB (ref 65–99)
Glucose-Capillary: 131 mg/dL — ABNORMAL HIGH (ref 65–99)
Glucose-Capillary: 91 mg/dL (ref 65–99)
Glucose-Capillary: 126 mg/dL — ABNORMAL HIGH (ref 65–99)

## 2017-05-24 MED ORDER — TRAMADOL HCL 50 MG PO TABS
50.0000 mg | ORAL_TABLET | Freq: Four times a day (QID) | ORAL | Status: DC | PRN
  Administered 2017-05-26: 50 mg via ORAL
  Filled 2017-05-24: qty 1

## 2017-05-24 MED ORDER — HYDROCODONE-ACETAMINOPHEN 10-325 MG PO TABS
1.0000 | ORAL_TABLET | Freq: Four times a day (QID) | ORAL | Status: DC | PRN
  Administered 2017-05-24 – 2017-05-26 (×7): 1 via ORAL
  Filled 2017-05-24 (×7): qty 1

## 2017-05-24 MED ORDER — DIPHENHYDRAMINE HCL 50 MG/ML IJ SOLN: 12.5000 mg | Freq: Four times a day (QID) | INTRAMUSCULAR | Status: DC | PRN

## 2017-05-24 MED ORDER — NALOXONE HCL 0.4 MG/ML IJ SOLN: 0.4000 mg | INTRAMUSCULAR | Status: DC | PRN

## 2017-05-24 MED ORDER — DIPHENHYDRAMINE HCL 12.5 MG/5ML PO ELIX
12.5000 mg | ORAL_SOLUTION | Freq: Four times a day (QID) | ORAL | Status: DC | PRN
  Filled 2017-05-24: qty 5

## 2017-05-24 MED ORDER — SODIUM CHLORIDE 0.9% FLUSH: 9.0000 mL | INTRAVENOUS | Status: DC | PRN

## 2017-05-24 NOTE — NC FL2 (Signed)
[place] LEVEL OF CARE SCREENING TOOL     IDENTIFICATION  Patient Name: [name] Birthdate: 1955-07-05 Sex: female Admission Date (Current Location): 05/22/2017  [facility] and [place] Number:  Herbalist and Address:  [facility], [place] [address] [place], [place] [address]      Provider Number: [id number]  Attending Physician Name and Address:  Conrad Nenzel, MD  Relative Name and Phone Number:  Verneice Caspers, granddaughter, [phone]    Current Level of Care: Hospital Recommended Level of Care: [place] Prior Approval Number:    Date Approved/Denied:   PASRR Number: [id number]  Discharge Plan: SNF    Current Diagnoses: Patient Active Problem List   Diagnosis Date Noted  . PAD (peripheral artery disease) ([place]) 05/22/2017  . PAF (paroxysmal atrial fibrillation) ([place]) 05/15/2017  . Altered mental status 04/18/2017  . Acute encephalopathy 04/18/2017  . Respiratory failure ([place])   . 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 Chronic diastolic CHF (congestive heart failure) ([place]) 08/08/2016  . Protein-calorie malnutrition, moderate ([place]) 06/16/2015  . Hypokalemia 06/04/2015  . Orthostatic hypotension 06/04/2015  . Syncope 06/04/2015  . Head injury with loss of consciousness ([place]) 06/04/2015  . Dehydration 06/04/2015  . Accelerated hypertension 06/04/2015  . Syncope and collapse 06/04/2015  . Pyuria 06/04/2015  . Cigarette smoker 06/04/2015  . Diabetes mellitus without complication ([place]) 73/41/9379  . HLD (hyperlipidemia) 01/03/2014  . COPD exacerbation ([place]) 01/03/2014  . Depression   . Hypertension   . COPD GOLD II/ still smoking    . Asthma   . ADHD (attention deficit hyperactivity disorder)   . Chest pain 12/15/2013  . GERD (gastroesophageal reflux disease)   . Anxiety   . Bronchitis   . Skin lesion of right arm 10/20/2011  . Post op  infection 03/24/2011  . Incisional hernia 01/20/2011  . Wound infection after surgery 09/09/2010    Orientation RESPIRATION BLADDER Height & Weight     Self, Time, Place, Situation  O2(nasal cannula 2L) Continent Weight: 134 lb (60.8 kg) Height:  5\' 5"  (165.1 cm)  BEHAVIORAL SYMPTOMS/MOOD NEUROLOGICAL BOWEL NUTRITION STATUS      Continent Diet(please see DC summary)  AMBULATORY STATUS COMMUNICATION OF NEEDS Skin   Limited Assist Verbally Surgical wounds(closed incisions L groin and L thigh; liquid skin adhesive)                       Personal Care Assistance Level of Assistance  Bathing, Feeding, Dressing Bathing Assistance: Limited assistance Feeding assistance: Independent Dressing Assistance: Limited assistance     Functional Limitations Info  Sight, Hearing, Speech Sight Info: Adequate Hearing Info: Adequate Speech Info: Adequate    SPECIAL CARE FACTORS FREQUENCY  PT (By licensed PT), OT (By licensed OT)     PT Frequency: 5x/week OT Frequency: 5x/week            Contractures Contractures Info: Not present    Additional Factors Info  Code Status, Allergies Code Status Info: Full Allergies Info: Contrast Media Iodinated Diagnostic Agents, Doxycycline, Erythromycin, Penicillins, Amoxicillin, Metronidazole           Current Medications (05/24/2017):  This is the current hospital active medication list Current Facility-Administered Medications  Medication Dose Route Frequency Provider Last Rate Last Dose  . 0.9 %  sodium chloride infusion  500 mL Intravenous Once PRN Dagoberto Ligas, PA-C      . acetaminophen (TYLENOL) tablet  325-650 mg  325-650 mg Oral Q4H PRN Dagoberto Ligas, PA-C       Or  . acetaminophen (TYLENOL) suppository 325-650 mg  325-650 mg Rectal Q4H PRN Dagoberto Ligas, PA-C      . albuterol (PROVENTIL) (2.5 MG/3ML) 0.083% nebulizer solution 3 mL  3 mL Inhalation BID PRN Dagoberto Ligas, PA-C      . ALPRAZolam Duanne Moron) tablet 1 mg  1 mg  Oral TID Dagoberto Ligas, PA-C   1 mg at 05/24/17 1044  . alum & mag hydroxide-simeth (MAALOX/MYLANTA) 200-200-20 MG/5ML suspension 15-30 mL  15-30 mL Oral Q2H PRN Dagoberto Ligas, PA-C      . atorvastatin (LIPITOR) tablet 20 mg  20 mg Oral q1800 Dagoberto Ligas, PA-C   20 mg at 05/23/17 1718  . diphenhydrAMINE (BENADRYL) injection 12.5 mg  12.5 mg Intravenous Q6H PRN Dagoberto Ligas, PA-C       Or  . diphenhydrAMINE (BENADRYL) 12.5 MG/5ML elixir 12.5 mg  12.5 mg Oral Q6H PRN Dagoberto Ligas, PA-C      . docusate sodium (COLACE) capsule 100 mg  100 mg Oral Daily Dagoberto Ligas, PA-C   100 mg at 05/24/17 1043  . fluticasone furoate-vilanterol (BREO ELLIPTA) 200-25 MCG/INH 1 puff  1 puff Inhalation Daily Dagoberto Ligas, PA-C   1 puff at 05/24/17 2774  . guaiFENesin-dextromethorphan (ROBITUSSIN DM) 100-10 MG/5ML syrup 15 mL  15 mL Oral Q4H PRN Dagoberto Ligas, PA-C      . heparin injection 5,000 Units  5,000 Units Subcutaneous Q8H Dagoberto Ligas, PA-C   5,000 Units at 05/24/17 1510  . hydrALAZINE (APRESOLINE) injection 5 mg  5 mg Intravenous Q20 Min PRN Dagoberto Ligas, PA-C   5 mg at 05/24/17 0548  . HYDROcodone-acetaminophen (NORCO) 10-325 MG per tablet 1 tablet  1 tablet Oral Q6H PRN Dagoberto Ligas, PA-C   1 tablet at 05/24/17 1038  . insulin aspart (novoLOG) injection 0-15 Units  0-15 Units Subcutaneous TID WC Dagoberto Ligas, PA-C   2 Units at 05/24/17 0825  . labetalol (NORMODYNE,TRANDATE) injection 10 mg  10 mg Intravenous Q10 min PRN Dagoberto Ligas, PA-C   10 mg at 05/22/17 1354  . magnesium sulfate IVPB 2 g 50 mL  2 g Intravenous Daily PRN Dagoberto Ligas, PA-C      . metoprolol succinate (TOPROL-XL) 24 hr tablet 100 mg  100 mg Oral Daily Dagoberto Ligas, PA-C   100 mg at 05/24/17 1039  . metoprolol tartrate (LOPRESSOR) injection 2-5 mg  2-5 mg Intravenous Q2H PRN Dagoberto Ligas, PA-C      . naloxone [facility]) injection 0.4 mg  0.4 mg Intravenous PRN Dagoberto Ligas, PA-C        And  . sodium chloride flush (NS) 0.9 % injection 9 mL  9 mL Intravenous PRN Dagoberto Ligas, PA-C      . nicotine (NICODERM CQ - dosed in mg/24 hours) patch 21 mg  21 mg Transdermal Daily Conrad Newark, MD   21 mg at 05/23/17 1013  . ondansetron (ZOFRAN) injection 4 mg  4 mg Intravenous Q6H PRN Dagoberto Ligas, PA-C      . pantoprazole (PROTONIX) EC tablet 40 mg  40 mg Oral Daily Dagoberto Ligas, PA-C   40 mg at 05/24/17 1045  . phenol (CHLORASEPTIC) mouth spray 1 spray  1 spray Mouth/Throat PRN Dagoberto Ligas, PA-C      . polyethylene glycol (MIRALAX / GLYCOLAX) packet 17 g  17 g Oral Daily PRN Dagoberto Ligas, PA-C   17 g at 05/23/17 1287  .  potassium chloride SA (K-DUR,KLOR-CON) CR tablet 20-40 mEq  20-40 mEq Oral Daily PRN Dagoberto Ligas, PA-C      . tiotropium [facility]) inhalation capsule 18 mcg  18 mcg Inhalation Daily Dagoberto Ligas, PA-C   18 mcg at 05/24/17 7628  . traMADol (ULTRAM) tablet 50 mg  50 mg Oral Q6H PRN Dagoberto Ligas, PA-C      . vortioxetine HBr (TRINTELLIX) tablet 20 mg  20 mg Oral Daily Dagoberto Ligas, PA-C   20 mg at 05/24/17 1045     Discharge Medications: Please see discharge summary for a list of discharge medications.  Relevant Imaging Results:  Relevant Lab Results:   Additional Information SSN: [id number]  Estanislado Emms, LCSW

## 2017-05-24 NOTE — Progress Notes (Signed)
Physical Therapy Treatment Patient Details Name: [name] MRN: [id number] DOB: Nov 29, 1955 Today's Date: 05/24/2017    History of Present Illness Pt. is a 62 y.o. F with significant PMH of HTN, CHF, PAD, COPD, and DM. 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 S/p left common femoral artery to below-the-knee popliteal artery bypass with Propaten.    PT Comments    Patient is progressing very well towards their physical therapy goals. Increased ambulation distance and improved motor initiation this session with gait. However, patient is still unsteady with gait and requires min assist with RW, especially with turning. With turns, patient has posterior lean and does not maintain four points of  Contact with RW on ground, requiring physical assistance by PT. Continue to recommend SNF to maximize functional independence.    Follow Up Recommendations  SNF     Equipment Recommendations  None recommended by PT    Recommendations for Other Services       Precautions / Restrictions Precautions Precautions: Fall Restrictions Weight Bearing Restrictions: No    Mobility  Bed Mobility               General bed mobility comments: Patient OOB on BSC  Transfers Overall transfer level: Needs assistance Equipment used: Rolling walker (2 wheeled) Transfers: Sit to/from Stand Sit to Stand: Min assist         General transfer comment: Min assist for transfer BSC to RW.   Ambulation/Gait Ambulation/Gait assistance: Min assist Ambulation Distance (Feet): 10 Feet Assistive device: Rolling walker (2 wheeled) Gait Pattern/deviations: Step-to pattern;Decreased stride length;Trunk flexed;Decreased step length - right Gait velocity: decreased   General Gait Details: Patient requiring min assist for ambulation in room with RW. VC's for larger R step length. Patient with improved motor initiation, however, extremely slow and  unsteady with turning. Needs physical assistance to turn RW and maintain 4 points of contact. Has posterior lean.   Stairs            Wheelchair Mobility    Modified Rankin (Stroke Patients Only)       Balance Overall balance assessment: Needs assistance Sitting-balance support: No upper extremity supported;Feet supported Sitting balance-Leahy Scale: Good     Standing balance support: Bilateral upper extremity supported Standing balance-Leahy Scale: Poor Standing balance comment: reliant on BUE support                            Cognition Arousal/Alertness: Awake/alert Behavior During Therapy: WFL for tasks assessed/performed Overall Cognitive Status: Impaired/Different from baseline Area of Impairment: Awareness;Memory                     Memory: Decreased short-term memory     Awareness: Emergent   General Comments: slow processing speed      Exercises Other Exercises Other Exercises: Seated marching x 20 Other Exercises: Seated LAQ's x 10 Other Exercises: Seated hip adduction x 10 (towel squeeze)    General Comments        Pertinent Vitals/Pain Pain Assessment: Faces Faces Pain Scale: Hurts even more Pain Location: LLE Pain Descriptors / Indicators: Discomfort;Grimacing Pain Intervention(s): Limited activity within patient's tolerance;Monitored during session;Patient requesting pain meds-RN notified    Home Living                      Prior Function  PT Goals (current goals can now be found in the care plan section) Acute Rehab PT Goals Patient Stated Goal: none stated Progress towards PT goals: Progressing toward goals    Frequency    Min 2X/week      PT Plan Current plan remains appropriate    Co-evaluation              AM-PAC PT "6 Clicks" Daily Activity  Outcome Measure  Difficulty turning over in bed (including adjusting bedclothes, sheets and blankets)?: A Little Difficulty  moving from lying on back to sitting on the side of the bed? : A Lot Difficulty sitting down on and standing up from a chair with arms (e.g., wheelchair, bedside commode, etc,.)?: Unable Help needed moving to and from a bed to chair (including a wheelchair)?: A Little Help needed walking in hospital room?: A Little Help needed climbing 3-5 steps with a railing? : A Lot 6 Click Score: 14    End of Session Equipment Utilized During Treatment: Gait belt;Oxygen Activity Tolerance: Patient limited by fatigue Patient left: in chair;with call bell/phone within reach;with chair alarm set;with family/visitor present Nurse Communication: Mobility status PT Visit Diagnosis: Unsteadiness on feet (R26.81);Muscle weakness (generalized) (M62.81);History of falling (Z91.81);Pain Pain - Right/Left: Left Pain - part of body: Leg     Time: 7169-6789 PT Time Calculation (min) (ACUTE ONLY): 27 min  Charges:  $Gait Training: 8-22 mins $Therapeutic Exercise: 8-22 mins                    G Codes:       Ellamae Sia, PT, DPT Acute Rehabilitation Services  Pager: [phone]   Willy Eddy 05/24/2017, 4:09 PM

## 2017-05-24 NOTE — Progress Notes (Signed)
MD Discontinued Morphine PCA. Patient used 3 mg in PCA pump and then 18 ml of morphine 2mg /ml pca injection wasted.   RN Architect  Witnessed by [employer] RN

## 2017-05-24 NOTE — Progress Notes (Addendum)
   Daily Progress Note   Assessment/Planning:   POD #2 s/p L CFA to AK BPG, DM, oxygen requiring COPD, chronic deconditioning, repeat overmedication leading to altered mental status   ABI demonstrates augmentation from 0.55 to 1.02.  D/C PCA and restart home regimen  Pt still too sleepy.  Pt complains of pain but barely awake  IMHO, this patient is too deconditioned to go home.  She has had two episodes of near fatal respiratory suppression due to essentially overmedication.  Once more awake, will get Psychiatry to see this patient as her interactions with me have been suggestive of major depressive disorder  Hopefully SNF placement by beginning of next week.   Subjective  - 2 Days Post-Op   C/o pain   Objective   Vitals:   05/24/17 0416 05/24/17 0511 05/24/17 0547 05/24/17 0638  BP: (!) 164/88  (!) 161/88   Pulse: 96     Resp: 17 (!) 28  (!) 26  Temp: 98.8 F (37.1 C)     TempSrc: Oral     SpO2: 97%   96%  Weight:      Height:         Intake/Output Summary (Last 24 hours) at 05/24/2017 0721 Last data filed at 05/24/2017 0300 Gross per 24 hour  Intake 240 ml  Output -  Net 240 ml    PULM  BLL rales  CV  RRR  GI  soft, NTND  VASC L groin and distal thigh inc c/d/i, palpable DP this AM  NEURO drowsy    Laboratory   CBC CBC Latest Ref Rng & Units 05/23/2017 05/16/2017 04/21/2017  WBC 4.0 - 10.5 K/uL 10.4 7.9 10.9(H)  Hemoglobin 12.0 - 15.0 g/dL 10.9(L) 14.2 11.1(L)  Hematocrit 36.0 - 46.0 % 34.4(L) 45.5 34.0(L)  Platelets 150 - 400 K/uL 157 232 162    BMET    Component Value Date/Time   NA 138 05/23/2017 0505   K 3.1 (L) 05/23/2017 0505   CL 101 05/23/2017 0505   CO2 27 05/23/2017 0505   GLUCOSE 147 (H) 05/23/2017 0505   BUN 7 05/23/2017 0505   CREATININE 0.60 05/23/2017 0505   CALCIUM 8.3 (L) 05/23/2017 0505   GFRNONAA >60 05/23/2017 0505   GFRAA >60 05/23/2017 0505     Adele Barthel, MD, FACS [facility]  [place] Office: [phone] Pager: [phone]  05/24/2017, 7:21 AM

## 2017-05-24 NOTE — Clinical Social Work Note (Signed)
Clinical Social Work Assessment  Patient Details  Name: [name] MRN: [id number] Date of Birth: [birth year]  Date of referral:  05/24/17               Reason for consult:  Facility Placement                Permission sought to share information with:  Family Supports Permission granted to share information::  Yes, Verbal Permission Granted  Name::     [place]::     Relationship::  granddaughter  Contact Information:  [phone]  Housing/Transportation Living arrangements for the past 2 months:  Single Family Home Source of Information:  Patient Patient Interpreter Needed:  None Criminal Activity/Legal Involvement Pertinent to Current Situation/Hospitalization:  No - Comment as needed Significant Relationships:  Other Family Members Lives with:  Other (Comment)(granddaughter) Do you feel safe going back to the place where you live?  Yes Need for family participation in patient care:  Yes (Comment)  Care giving concerns: Patient from home with granddaughter. PT recommending SNF.   Social Worker assessment / plan: CSW met with patient at bedside. Patient alert and oriented, though with flat affect and only giving brief answers to CSW. CSW discussed recommendation for SNF. Patient refused SNF, stated she would be going home. Patient indicated she lives with her granddaughter. CSW to follow up with granddaughter, [name] regarding disposition plan. CSW will follow and support.  Employment status:  Retired Forensic scientist:  Medicare PT Recommendations:  [place] / Referral to community resources:  [place]  Patient/Family's Response to care: Patient stated she is aggravated because she feels she's not getting enough pain medication.  Patient/Family's Understanding of and Emotional Response to Diagnosis, Current Treatment, and Prognosis: Did not discuss emotional response. Patient refusing SNF.  Emotional  Assessment Appearance:  Appears stated age Attitude/Demeanor/Rapport:  Engaged Affect (typically observed):  Flat, Blunt Orientation:  Oriented to Self, Oriented to Place, Oriented to  Time, Oriented to Situation Alcohol / Substance use:  Not Applicable Psych involvement (Current and /or in the community):  No (Comment)  Discharge Needs  Concerns to be addressed:  Discharge Planning Concerns, Care Coordination Readmission within the last 30 days:  Yes Current discharge risk:  Physical Impairment Barriers to Discharge:  Continued Medical Work up   Estanislado Emms, LCSW 05/24/2017, 4:12 PM

## 2017-05-25 LAB — GLUCOSE, CAPILLARY
GLUCOSE-CAPILLARY: 175 mg/dL — AB (ref 65–99)
GLUCOSE-CAPILLARY: 134 mg/dL — AB (ref 65–99)
GLUCOSE-CAPILLARY: 193 mg/dL — AB (ref 65–99)
Glucose-Capillary: 154 mg/dL — ABNORMAL HIGH (ref 65–99)

## 2017-05-25 NOTE — Progress Notes (Signed)
Patient requested for her home med [facility], verbal order received from [place], [place] to order med, med not available at pharmacy, patient advised to bring med from home.

## 2017-05-25 NOTE — Progress Notes (Signed)
Clinical Social Worker spoke with patient and granddaughter Gari Crown) at bedside about discharge plan. Patient and granddaughter made a decision to have patient discharge to [facility]. Patient stated she will work with rehab at the facility so she is able to go home to family. CSW spoke with [facility] admission coordinator at [place]. Suanne Marker stated she will have room available for patient tomorrow. CSW contacted MD to make him aware of bed avalability.  Rhea Pink, MSW,  [place]

## 2017-05-25 NOTE — Progress Notes (Signed)
   Daily Progress Note   Assessment/Planning:   POD #3 s/p L fem-pop BPG with Propaten, persistent L leg pain   Patient more awake today.    Given her near total lethargy with PCA and two prior episodes of AMS from overmedication, pain control will be problematic.  Pt complains of pain but her physical findings suggest otherwise, e.g. C/o severe pain but barely able to speak.  Patient likely has a chronic pain issue that will need to be referred to a Pain Clinic for long-term management.  Her ABIs demonstrate significant augmentation of LLE blood flow.  This reiterates that this patient L leg pain is likely due to a secondary etiology.   I reiterated to the patient that her inability to independently ambulate makes her a high risk for injury in the home setting.  This patient has extensive deconditioning with widescale loss of muscle tone.  Her baseline functional status was likely limited.  Her PT evaluations confirm my concerns.  I again discussed with the patient that she needs a facility based rehabilitation prior to returning home.  PT/OT over the weekend and then likely discharge on Monday   Subjective  - 3 Days Post-Op   C/o pain   Objective   Vitals:   05/25/17 0201 05/25/17 0354 05/25/17 0401 05/25/17 0447  BP:   (!) 181/88 (!) 176/82  Pulse:   79   Resp:   14 14  Temp:   98.3 F (36.8 C)   TempSrc:   Oral   SpO2:   95%   Weight: 137 lb 2 oz (62.2 kg) 138 lb 11.2 oz (62.9 kg)    Height:         Intake/Output Summary (Last 24 hours) at 05/25/2017 0714 Last data filed at 05/25/2017 0640 Gross per 24 hour  Intake 1260 ml  Output 1950 ml  Net -690 ml    PULM  CTAB  CV  RRR  GI  soft, NTND  VASC Palpable DP, all inc c/d/i, no hematoma  NEURO MAE spont to cmd    Laboratory   CBC CBC Latest Ref Rng & Units 05/23/2017 05/16/2017 04/21/2017  WBC 4.0 - 10.5 K/uL 10.4 7.9 10.9(H)  Hemoglobin 12.0 - 15.0 g/dL 10.9(L) 14.2 11.1(L)  Hematocrit 36.0 - 46.0 %  34.4(L) 45.5 34.0(L)  Platelets 150 - 400 K/uL 157 232 162    BMET    Component Value Date/Time   NA 138 05/23/2017 0505   K 3.1 (L) 05/23/2017 0505   CL 101 05/23/2017 0505   CO2 27 05/23/2017 0505   GLUCOSE 147 (H) 05/23/2017 0505   BUN 7 05/23/2017 0505   CREATININE 0.60 05/23/2017 0505   CALCIUM 8.3 (L) 05/23/2017 0505   GFRNONAA >60 05/23/2017 0505   GFRAA >60 05/23/2017 0505     Adele Barthel, MD, FACS [facility] [place] Office: [phone] Pager: [phone]  05/25/2017, 7:14 AM

## 2017-05-25 NOTE — Progress Notes (Signed)
Patient has a wound on her lower mid abdomen from a previous surgery, wound appears to not have healed properly Glennon Mac, PA notified to assess patient will continue to monitor.

## 2017-05-25 NOTE — Plan of Care (Signed)
  Problem: Education: Goal: Knowledge of General Education information will improve Outcome: Progressing   

## 2017-05-26 LAB — GLUCOSE, CAPILLARY
Glucose-Capillary: 150 mg/dL — ABNORMAL HIGH (ref 65–99)
Glucose-Capillary: 268 mg/dL — ABNORMAL HIGH (ref 65–99)
Glucose-Capillary: 126 mg/dL — ABNORMAL HIGH (ref 65–99)

## 2017-05-26 LAB — NICOTINE/COTININE METABOLITES
Cotinine: 96.3 ng/mL
NICOTINE: NOT DETECTED ng/mL

## 2017-05-26 MED ORDER — HYDROCODONE-ACETAMINOPHEN 10-325 MG PO TABS: 1.0000 | ORAL_TABLET | ORAL | 0 refills | Status: AC | PRN

## 2017-05-26 NOTE — Progress Notes (Signed)
Patient will DC to: [facility] Anticipated DC date: 05/26/17 Family notified: Granddaughter at bedside Transport by: Corey Harold   Per MD patient ready for DC to [facility]. RN, patient, patient's family, and facility notified of DC. Discharge Summary sent to facility. RN given number for report [phone]). DC packet on chart. Ambulance transport requested for patient.   CSW signing off.  Cedric Fishman, LCSW Clinical Social Worker [phone]

## 2017-05-26 NOTE — Plan of Care (Signed)
Care plan reviewed, patient is adequate for discharge.

## 2017-05-26 NOTE — Discharge Instructions (Signed)
 [facility]  Discharge instructions  Lower Extremity Bypass Surgery  Please refer to the following instruction for your post-procedure care. Your surgeon or physician assistant will discuss any changes with you.  Activity  You are encouraged to walk as much as you can. You can slowly return to normal activities during the month after your surgery. Avoid strenuous activity and heavy lifting until your doctor tells you it's OK. Avoid activities such as vacuuming or swinging a golf club. Do not drive until your doctor give the OK and you are no longer taking prescription pain medications. It is also normal to have difficulty with sleep habits, eating and bowel movement after surgery. These will go away with time.  Bathing/Showering  You may shower after you go home. Do not soak in a bathtub, hot tub, or swim until the incision heals completely.  Incision Care  Clean your incision with mild soap and water. Shower every day. Pat the area dry with a clean towel. You do not need a bandage unless otherwise instructed. Do not apply any ointments or creams to your incision. If you have open wounds you will be instructed how to care for them or a visiting nurse may be arranged for you. If you have staples or sutures along your incision they will be removed at your post-op appointment. You may have skin glue on your incision. Do not peel it off. It will come off on its own in about one week. If you have a great deal of moisture in your groin, use a gauze help keep this area dry.  Diet  Resume your normal diet. There are no special food restrictions following this procedure. A low fat/ low cholesterol diet is recommended for all patients with vascular disease. In order to heal from your surgery, it is CRITICAL to get adequate nutrition. Your body requires vitamins, minerals, and protein. Vegetables are the best source of vitamins and minerals. Vegetables also provide the  perfect balance of protein. Processed food has little nutritional value, so try to avoid this.  Medications  Resume taking all your medications unless your doctor or nurse practitioner tells you not to. If your incision is causing pain, you may take over-the-counter pain relievers such as acetaminophen (Tylenol). If you were prescribed a stronger pain medication, please aware these medication can cause nausea and constipation. Prevent nausea by taking the medication with a snack or meal. Avoid constipation by drinking plenty of fluids and eating foods with high amount of fiber, such as fruits, vegetables, and grains. Take Colase 100 mg (an over-the-counter stool softener) twice a day as needed for constipation. Do not take Tylenol if you are taking prescription pain medications.  Follow Up  Our office will schedule a follow up appointment 2-3 weeks following discharge.  Please call us immediately for any of the following conditions  Severe or worsening pain in your legs or feet while at rest or while walking Increase pain, redness, warmth, or drainage (pus) from your incision site(s) Fever of 101 degree or higher The swelling in your leg with the bypass suddenly worsens and becomes more painful than when you were in the hospital If you have been instructed to feel your graft pulse then you should do so every day. If you can no longer feel this pulse, call the office immediately. Not all patients are given this instruction.  Leg swelling is common after leg bypass surgery.  The swelling should improve over a few months   following surgery. To improve the swelling, you may elevate your legs above the level of your heart while you are sitting or resting. Your surgeon or physician assistant may ask you to apply an ACE wrap or wear compression (TED) stockings to help to reduce swelling.  Reduce your risk of vascular disease  Stop smoking. If you would like help call [facility] at 1-800-QUIT-NOW  (1-800-784-8669) or [facility] at 336-586-4000.  Manage your cholesterol Maintain a desired weight Control your diabetes weight Control your diabetes Keep your blood pressure down  If you have any questions, please call the office at [phone]   

## 2017-05-26 NOTE — Plan of Care (Signed)
Care plan reviewed, patient is progressing.  

## 2017-05-26 NOTE — Progress Notes (Addendum)
     Subjective  - Doing OK this am.  No new complaints.   Objective (!) 155/87 73 98.2 F (36.8 C) (Oral) 20 98%  Intake/Output Summary (Last 24 hours) at 05/26/2017 0841 Last data filed at 05/26/2017 4619 Gross per 24 hour  Intake 862 ml  Output 1503 ml  Net -641 ml    Left LE incision healing well, palpable DP pulse Groin soft with out hematoma Heart RRR Lungs non labored breathing   Assessment/Planning: POD # 4 s/p L fem-pop BPG with Propaten, persistent L leg pain  Plan discharge to SNF for rehabilitation and safety with ambulation. Patent by pass with palpable pulse. Disposition stable for discharge. F/U in our office in 2-3 weeks with Dr. Rosezella Rumpf Gerri Lins 05/26/2017 8:41 AM --  Laboratory Lab Results: No results for input(s): WBC, HGB, HCT, PLT in the last 72 hours. BMET No results for input(s): NA, K, CL, CO2, GLUCOSE, BUN, CREATININE, CALCIUM in the last 72 hours.  COAG Lab Results  Component Value Date   INR 0.97 05/22/2017   INR 0.92 04/18/2017   INR 0.98 01/24/2017   No results found for: PTT  I have independently interviewed and examined patient and agree with PA assessment and plan above.   Jocelynn Gioffre C. Donzetta Matters, MD [facility] Office: [phone] Pager: [phone]

## 2017-05-26 NOTE — Progress Notes (Signed)
The visitors who are friends of [name], are demanding that [name] wants to go home.  [name] agreed. They are stating that [name] never wanted to go to rehab and she always wanted to go home.  The friends state that they can be there all day to provide care and rehab for the [name].  Update given to LCSW.

## 2017-05-26 NOTE — Progress Notes (Addendum)
12:23pm- [facility] can accept patient today at [place] updated.   12pm-Brittany called CSW back and requested [facility]. [facility] may have a patient discharging today, they will let CSW know. CSW encouraged [name] to have another SNF ready just in case.   10am-CSW contacted patient's granddaughter to clarify discharge plan. She states that she looked at reviews on line and does not want patient to go to [facility]. CSW stated that patient said she was going to [facility]. [name] stated that the [facility] wants [facility] because it is closer, but she is not sure she likes [facility] either. CSW let her know that patient has been discharged and we need a decision ASAP to make sure a bed is available. [name] stated she would call CSW back soon after speaking with family.   Percell Locus Ankith Edmonston LCSW [phone]

## 2017-05-26 NOTE — Progress Notes (Signed)
[name] was given discharge instructions and discharged from the facility by [facility].  [name] agrees to go to [facility].  Discussed new medications and medication changes.  Discussed follow up appointments and sign and symptoms to contact her Physician for.  Verbalized understanding.

## 2017-05-26 NOTE — Clinical Social Work Placement (Signed)
   CLINICAL SOCIAL WORK PLACEMENT  NOTE  Date:  05/26/2017  Patient Details  Name: [name] MRN: [id number] Date of Birth: [birth year]  Clinical Social Work is seeking post-discharge placement for this patient at the [place] level of care (*CSW will initial, date and re-position this form in  chart as items are completed):  Yes   Patient/family provided with [facility] Work Department's list of facilities offering this level of care within the geographic area requested by the patient (or if unable, by the patient's family).  Yes   Patient/family informed of their freedom to choose among providers that offer the needed level of care, that participate in Medicare, Medicaid or managed care program needed by the patient, have an available bed and are willing to accept the patient.  Yes   Patient/family informed of 's ownership interest in [facility] and [facility], as well as of the fact that they are under no obligation to receive care at these facilities.  PASRR submitted to EDS on 05/25/17     PASRR number received on 05/25/17     Existing PASRR number confirmed on       FL2 transmitted to all facilities in geographic area requested by pt/family on 05/25/17     FL2 transmitted to all facilities within larger geographic area on       Patient informed that his/her managed care company has contracts with or will negotiate with certain facilities, including the following:        Yes   Patient/family informed of bed offers received.  Patient chooses bed at [facility]     Physician recommends and patient chooses bed at      Patient to be transferred to [facility] on 05/26/17.  Patient to be transferred to facility by [facility]     Patient family notified on 05/26/17 of transfer.  Name of family member notified:  Granddaughter, [place]       Additional Comment:     _______________________________________________ Benard Halsted, [place] 05/26/2017, 2:56 PM

## 2017-05-26 NOTE — Discharge Summary (Addendum)
Patient ID:  [name] MRN: [id number] DOB/AGE: 04/23/1955 62 y.o.  Admit date: 05/22/2017 Discharge date: 05/26/2017 Date of Surgery: 05/22/2017 Surgeon: Surgeon(s): Conrad Carrboro, MD  Admission Diagnosis: PERIPHERAL ARTERY DISEASE  Discharge Diagnoses:  PERIPHERAL ARTERY DISEASE  Secondary Diagnoses: Past Medical History:  Diagnosis Date  . ADD (attention deficit disorder with hyperactivity)   . Anxiety   . Arthritis    "neck" (06/16/2015)  . Asthma   . Basal cell carcinoma, face   . Cervical cancer ([place])   . Chronic bronchitis ([place])   . 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 COPD (chronic obstructive pulmonary disease) ([place])   . Depression   . GERD (gastroesophageal reflux disease)   . Hypertension   . On home oxygen therapy    "2L at bedtime" (06/16/2015)  . PAF (paroxysmal atrial fibrillation) ([place])    04/18/17 in the setting of bronchodilators and intubation for hypercarbic respiratory failure  . Squamous cell carcinoma, face   . Thyroid cyst   . Type II diabetes mellitus (H[facility])     Procedure(s): BYPASS GRAFT LEFT COMMON  FEMORAL-ABOVE THE KNEE POPLITEAL ARTERY USING 6 x 40 GORE PROPATEN VASCULAR GRAFT  Discharged Condition: stable  HPI: 62 y/o female with left leg pain at rest and no open wounds or ischemic changes pulseless left LE.   Prior procedure include: Diagnostic Ao with LRo (04/05/16): SFA occlusion with rapid PFA reconstitution of AK pop She was brought to the ED requiring intubation and sedation.  Concerns for aspiration PNA were also raised.  She had new onset afib during this admission.  The patient was started on Apixaban.  The patient has NOT been seen by Cardiology.    She was last seen on 05/07/2017 by Dr. Bridgett Larsson.  Her symptoms have not changed and he was not convinced that her left leg pain is completely due to her right superficial femoral artery occlusion as she has extensive rapid profunda femoral artery collateralization.  Her left leg pain relief  is unlikely to be complete after the bypass.  The family wished to proceed.     Hospital Course:  [name] is a 62 y.o. female is S/P  Procedure(s): BYPASS GRAFT LEFT COMMON  FEMORAL-ABOVE THE KNEE POPLITEAL ARTERY USING 6 x 40 GORE PROPATEN VASCULAR GRAFT  Post op she required PCA Morphine pain control.  Palpable ATA left LE.  Patent by pass. ABI's have been completed. Right 1.01 Left 1.02, pre op 0.55 Hx of COPD requiring O 2 at home.  Placed on Nicotine patch encourage smoking cessation.  PCA discontinued post op day 3.  This am more comfortable and pain appears well controled on PO medication.  Mobility is a primary issue post op and from long standing deconditioning. PT recommendation SNF for rehab.           Significant Diagnostic Studies: CBC Lab Results  Component Value Date   WBC 10.4 05/23/2017   HGB 10.9 (L) 05/23/2017   HCT 34.4 (L) 05/23/2017   MCV 92.5 05/23/2017   PLT 157 05/23/2017    BMET    Component Value Date/Time   NA 138 05/23/2017 0505   K 3.1 (L) 05/23/2017 0505   CL 101 05/23/2017 0505   CO2 27 05/23/2017 0505   GLUCOSE 147 (H) 05/23/2017 0505   BUN 7 05/23/2017 0505   CREATININE 0.60 05/23/2017 0505   CALCIUM 8.3 (L) 05/23/2017 0505   GFRNONAA >60 05/23/2017 0505   GFRAA >60 05/23/2017 0505  COAG Lab Results  Component Value Date   INR 0.97 05/22/2017   INR 0.92 04/18/2017   INR 0.98 01/24/2017     Disposition:  Discharge to :SNF Discharge Instructions    Call MD for:  redness, tenderness, or signs of infection (pain, swelling, bleeding, redness, odor or green/yellow discharge around incision site)   Complete by:  As directed    Call MD for:  severe or increased pain, loss or decreased feeling  in affected limb(s)   Complete by:  As directed    Call MD for:  temperature >100.5   Complete by:  As directed    Resume previous diet   Complete by:  As directed      Allergies as of 05/26/2017      Reactions   Contrast  Media [iodinated Diagnostic Agents] Nausea And Vomiting   Doxycycline Nausea And Vomiting   Erythromycin Nausea And Vomiting   Penicillins Itching, Rash   Has patient had a PCN reaction causing immediate rash, facial/tongue/throat swelling, SOB or lightheadedness with hypotension: YES Has patient had a PCN reaction causing severe rash involving mucus membranes or skin necrosis: NO Has patient had a PCN reaction that required hospitalization: NO Has patient had a PCN reaction occurring within the last 10 years: Unknown If all of the above answers are "NO", then may proceed with Cephalosporin use.   Amoxicillin Other (See Comments)   Reaction not recalled by family member and the patient is presently intubated   Metronidazole Hives, Rash      Medication List    TAKE these medications   albuterol 108 (90 Base) MCG/ACT inhaler Commonly known as:  PROVENTIL HFA;VENTOLIN HFA Inhale 2 puffs into the lungs 2 (two) times daily as needed for wheezing.   albuterol (2.5 MG/3ML) 0.083% nebulizer solution Commonly known as:  PROVENTIL Take 3 mLs (2.5 mg total) by nebulization every 4 (four) hours as needed for wheezing or shortness of breath.   alprazolam 2 MG tablet Commonly known as:  XANAX Take 0.5 tablets (1 mg total) by mouth at bedtime as needed for sleep or anxiety (Can take up to 3 thimes daily prn). Patient takes 1/2 tablet by mouth four times a day What changed:    how much to take  when to take this  additional instructions   atorvastatin 20 MG tablet Commonly known as:  LIPITOR Take 20 mg by mouth at bedtime   budesonide-formoterol 160-4.5 MCG/ACT inhaler Commonly known as:  SYMBICORT Inhale 2 puffs into the lungs 2 (two) times daily.   glipiZIDE 10 MG tablet Commonly known as:  GLUCOTROL Take 10 mg by mouth 2 (two) times daily before a meal.   HYDROcodone-acetaminophen 10-325 MG tablet Commonly known as:  NORCO Take 1-2 tablets by mouth every 4 (four) hours as needed  for moderate pain. What changed:    how much to take  when to take this   metFORMIN 1000 MG tablet Commonly known as:  GLUCOPHAGE Take 1,000 mg by mouth two times a day   metoprolol succinate 100 MG 24 hr tablet Commonly known as:  TOPROL-XL Take 100 mg by mouth once a day   OXYGEN Inhale 3 L into the lungs continuous.   REXULTI 4 MG Tabs Generic drug:  Brexpiprazole Take 4 mg by mouth every morning.   tiotropium 18 MCG inhalation capsule Commonly known as:  SPIRIVA HANDIHALER Place 1 capsule (18 mcg total) into inhaler and inhale daily.   traMADol 50 MG tablet Commonly known as:  ULTRAM Take 1 tablet (50 mg total) by mouth every 6 (six) hours as needed.   TRINTELLIX 20 MG Tabs tablet Generic drug:  vortioxetine HBr Take 20 mg by mouth daily.      Verbal and written Discharge instructions given to the patient. Wound care per Discharge AVS  Contact information for follow-up providers    Conrad Hollister, MD Follow up in 3 week(s).   Specialties:  Vascular Surgery, Cardiology Why:  office will call Contact information: [address] [place] [address] [phone]            Contact information for after-discharge care    [facility] .   Service:  Skilled Nursing Contact information: [address] [place] [phone]                  Signed: Roxy Horseman 05/26/2017, 9:03 AM   Addendum  COZETTA SEIF is a 62 y.o. (Jun 29, 1955) female is patient with chronic left leg pain.  Her angiography demonstrated profunda femoral artery collaterals reconstituting an above-the-knee popliteal artery.  Based on the images, I did not think the patient's peripheral arterial disease was the main etiology for her pain.  I offered her left common femoral artery to above-the-knee popliteal artery bypass with Propaten (no adequate vein) to complete her revascularization.  She had significant  deconditioning preoperatively and we had discussed preoperatively that she likely would need rehab.  Her operation was uneventful and her post-operatively course was as expected.  She had to be titrated off a PCA.  Her pain and anxiety regimen were managed to avoid overdose.  She initially refused transfer to SNF for continued rehab.  After further discussions with the patient, it was evident that there was inadequate assistance at home for her to complete rehab at home.  Her left leg continues to have pain, suggesting as expected preoperatively a secondary etiology.  Her post-operative ABI demonstrate restoration of flow to the left foot.  The patient will follow up in the office in 2 weeks for wound checks.   Adele Barthel, MD, FACS [facility] Office: [phone] Pager: [phone]  05/28/2017, 7:24 AM   - For VQI Registry use --- Instructions: Press F2 to tab through selections.  Delete question if not applicable.   Post-op:  Wound infection: No  Graft infection: No  Transfusion: No  If yes, 0 units given New Arrhythmia: No Ipsilateral amputation: [x ] no, [ ]  Minor, [ ]  BKA, [ ]  AKA Discharge patency: [ x] Primary, [ ]  Primary assisted, [ ]  Secondary, [ ]  Occluded Patency judged by: [ ]  Dopper only, [ ]  Palpable graft pulse, [ ]  Palpable distal pulse, [ ]  ABI inc. > 0.15, [ ]  Duplex Discharge ABI: R 1.0, L 1.0 D/C Ambulatory Status: Ambulatory with Assistance  Complications: MI: [x]  No, [ ]  Troponin only, [ ]  EKG or Clinical CHF: No Resp failure: [ ]  none, [x ] Pneumonia, [ ]  Ventilator Chg in renal function: [x ] none, [ ]  Inc. Cr > 0.5, [ ]  Temp. Dialysis, [ ]  Permanent dialysis Stroke: [x ] None, [ ]  Minor, [ ]  Major Return to OR: No  Reason for return to OR: [ ]  Bleeding, [ ]  Infection, [ ]  Thrombosis, [ ]  Revision  Discharge medications: Statin use:  Yes ASA use:  No  for medical reason   Plavix use:  No  for medical reason  Beta blocker  use: No  for medical reason   Coumadin use: No  for medical reason

## 2017-05-26 NOTE — Progress Notes (Signed)
Patient in agreement with discharge to [facility]. She stated she is worried they will try to keep her forever. CSW reassured her that she is in charge of her care. Patient's friends and granddaughter at bedside.   Percell Locus Casimir Barcellos LCSW [phone]

## 2017-05-29 ENCOUNTER — Telehealth: Payer: Self-pay | Admitting: Vascular Surgery

## 2017-05-29 NOTE — Telephone Encounter (Signed)
spoke to [place] at [facility] for appt on 5/8 Mld lttr

## 2017-05-29 NOTE — Telephone Encounter (Signed)
-----   Message from Mena Goes, RN sent at 05/26/2017 11:06 AM EDT ----- Regarding: 2-3 weeks with Dr. Bridgett Larsson   ----- Message ----- From: Ulyses Amor, PA-C Sent: 05/26/2017   8:59 AM To: Vvs Charge Pool  F/U with Dr. Bridgett Larsson in 2-3 weeks s/p left Fem-pop going to SNF

## 2017-06-18 NOTE — Progress Notes (Deleted)
Established Previous Bypass   History of Present Illness   [name] is a 62 y.o. (17-Mar-1955) female who presents with chief complaint: ***.    Prior procedures include: 1.  L CFA to BK pop BPG w/ Propaten (05/22/17) for chronic leg pain (atypical rest pain)  The patient's symptoms have *** progressed.  The patient's symptoms are: ***. The patient's treatment regimen currently included: maximal medical management ***and ***.  This patient had significant functional limitations prior to the bypass.  Post-op she was transferred to a SNF for rehab.  In the few months prior to her procedure, this patient had two acute episodes of respiratory suppression with AMS felt due to overdosing on anxiolytics and pain medications.  We had discussed referral to Chronic Pain post-operatively once she was stable.  The patient's PMH, PSH, SH, and FamHx were reviewed on 06/21/14 are unchanged from 05/26/17.  Current Outpatient Medications  Medication Sig Dispense Refill  . albuterol (PROVENTIL HFA;VENTOLIN HFA) 108 (90 BASE) MCG/ACT inhaler Inhale 2 puffs into the lungs 2 (two) times daily as needed for wheezing.    Marland Kitchen albuterol (PROVENTIL) (2.5 MG/3ML) 0.083% nebulizer solution Take 3 mLs (2.5 mg total) by nebulization every 4 (four) hours as needed for wheezing or shortness of breath. 75 mL 0  . alprazolam (XANAX) 2 MG tablet Take 0.5 tablets (1 mg total) by mouth at bedtime as needed for sleep or anxiety (Can take up to 3 thimes daily prn). Patient takes 1/2 tablet by mouth four times a day (Patient taking differently: Take 1-2 mg by mouth See admin instructions. 1 mg by mouth in the morning then 1 mg at noon(time) then 1 mg at 4 PM then 2 mg at bedtime) 30 tablet 0  . atorvastatin (LIPITOR) 20 MG tablet Take 20 mg by mouth at bedtime  3  . budesonide-formoterol (SYMBICORT) 160-4.5 MCG/ACT inhaler Inhale 2 puffs into the lungs 2 (two) times daily. 1 Inhaler 0  . glipiZIDE (GLUCOTROL) 10 MG tablet  Take 10 mg by mouth 2 (two) times daily before a meal.     . HYDROcodone-acetaminophen (NORCO) 10-325 MG tablet Take 1-2 tablets by mouth every 4 (four) hours as needed for moderate pain. 30 tablet 0  . metFORMIN (GLUCOPHAGE) 1000 MG tablet Take 1,000 mg by mouth two times a day  3  . metoprolol succinate (TOPROL-XL) 100 MG 24 hr tablet Take 100 mg by mouth once a day  2  . OXYGEN Inhale 3 L into the lungs continuous.    Marland Kitchen REXULTI 4 MG TABS Take 4 mg by mouth every morning.  4  . tiotropium (SPIRIVA HANDIHALER) 18 MCG inhalation capsule Place 1 capsule (18 mcg total) into inhaler and inhale daily. 30 capsule 0  . traMADol (ULTRAM) 50 MG tablet Take 1 tablet (50 mg total) by mouth every 6 (six) hours as needed. 20 tablet 0  . vortioxetine HBr (TRINTELLIX) 20 MG TABS tablet Take 20 mg by mouth daily.     No current facility-administered medications for this visit.     On ROS today: ***, ***   Physical Examination  ***There were no vitals filed for this visit. ***There is no height or weight on file to calculate BMI.  General {LOC:19197::"Somulent","Alert"}, {Orientation:19197::"Confused","O x 3"}, {Weight:19197::"Obese","Cachectic","WD"}, {General state of health:19197::"Ill appearing","Elderly","NAD"}  Pulmonary {Chest wall:19197::"Asx chest movement","Sym exp"}, {Air movt:19197::"Decreased *** air movt","good B air movt"}, {BS:19197::"rales on ***","rhonchi on ***","wheezing on ***","CTA B"}  Cardiac {Rhythm:19197::"Irregularly, irregular rate and rhythm","RRR, Nl S1, S2"}, {  Murmur:19197::"Murmur present: ***","no Murmurs"}, {Rubs:19197::"Rub present: ***","No rubs"}, {Gallop:19197::"Gallop present: ***","No S3,S4"}  Vascular Vessel Right Left  Radial {Palpable:19197::"Not palpable","Faintly palpable","Palpable"} {Palpable:19197::"Not palpable","Faintly palpable","Palpable"}  Brachial {Palpable:19197::"Not palpable","Faintly palpable","Palpable"} {Palpable:19197::"Not palpable","Faintly  palpable","Palpable"}  Carotid Palpable, {Bruit:19197::"Bruit present","No Bruit"} Palpable, {Bruit:19197::"Bruit present","No Bruit"}  Aorta Not palpable N/A  Femoral {Palpable:19197::"Not palpable","Faintly palpable","Palpable"} {Palpable:19197::"Not palpable","Faintly palpable","Palpable"}  Popliteal {Palpable:19197::"Prominently palpable","Not palpable"} {Palpable:19197::"Prominently palpable","Not palpable"}  PT {Palpable:19197::"Not palpable","Faintly palpable","Palpable"} {Palpable:19197::"Not palpable","Faintly palpable","Palpable"}  DP {Palpable:19197::"Not palpable","Faintly palpable","Palpable"} {Palpable:19197::"Not palpable","Faintly palpable","Palpable"}    Gastro- intestinal soft, {Distension:19197::"distended","non-distended"}, {TTP:19197::"TTP in *** quadrant","appropriate tenderness to palpation","non-tender to palpation"}, {Guarding:19197::"Guarding and rebound in *** quadrant","No guarding or rebound"}, {HSM:19197::"HSM present","no HSM"}, {Masses:19197::"Mass present: ***","no masses"}, {Flank:19197::"CVAT on ***","Flank bruit present on ***","no CVAT B"}, {AAA:19197::"Palpable prominent aortic pulse","No palpable prominent aortic pulse"}, {Surgical incision:19197::"Surg incisions: ***","Surgical incisions well healed"," "}  Musculo- skeletal M/S 5/5 throughout {MS:19197::"except ***"," "}, Extremities without ischemic changes {MS:19197::"except ***"," "}, {Edema:19197::"Pitting edema present: ***","Non-pitting edema present: ***","No edema present"}, {Varicosities:19197::"Varicosities present: ***","No visible varicosities "}, {LDS:19197::"Lipodermatosclerosis present: ***","No Lipodermatosclerosis present"}  Neurologic Pain and light touch intact in extremities{CN:19197::" except for decreased sensation in ***"," "}, Motor exam as listed above    Non-Invasive Vascular Imaging ABI (***)  R:   ABI: *** (***),   PT: {Signals:19197::"none","mono","bi","tri"}  DP:  {Signals:19197::"none","mono","bi","tri"}  TBI:  ***  L:   ABI: *** (***),   PT: {Signals:19197::"none","mono","bi","tri"}  DP: {Signals:19197::"none","mono","bi","tri"}  TBI: ***  Aortoiliac Duplex (***)  Ao: *** c/s  R iliac: *** c/s  L iliac: *** c/s  Bypass Duplex (***)  Proximal to bypass: *** c/s  With-in bypass: *** c/s  Distal to bypass: *** c/s   Medical Decision Making   [name] is a 62 y.o. female who presents with: s/p R CFA to BK pop BPG w/ Propaten .   Based on the patient's vascular studies and examination, I have offered the patient: BLE ABI and bypass duplex in 3 months.  I discussed in depth with the patient the nature of atherosclerosis, and emphasized the importance of maximal medical management including strict control of blood pressure, blood glucose, and lipid levels, obtaining regular exercise, and cessation of smoking.    The patient is aware that without maximal medical management the underlying atherosclerotic disease process will progress, limiting the benefit of any interventions.  I discussed in depth with the patient the need for long term surveillance to improve the primary assisted patency of his bypass.  The patient agrees to cooperate with such.  The patient is currently on a statin: Lipitor.   The patient is currently not on an anti-platelet. Patient will be started on ASA 81 mg PO daily  Thank you for allowing [place] to participate in this patient's care.   Adele Barthel, MD, FACS [facility] Office: [phone] Pager: [phone]

## 2017-06-20 ENCOUNTER — Encounter: Payer: Medicare Other | Admitting: Vascular Surgery

## 2017-08-07 ENCOUNTER — Ambulatory Visit (HOSPITAL_COMMUNITY)
Admission: EM | Admit: 2017-08-07 | Discharge: 2017-08-07 | Disposition: A | Discharge status: Nursing Home (Includes ICF) | Payer: Medicare Other | Source: Home / Self Care

## 2017-08-07 ENCOUNTER — Other Ambulatory Visit: Payer: Self-pay

## 2017-08-07 ENCOUNTER — Emergency Department (HOSPITAL_COMMUNITY)
Admission: EM | Admit: 2017-08-07 | Discharge: 2017-08-07 | Disposition: A | Discharge status: Home / Self Care | Payer: Medicare Other | Attending: Emergency Medicine | Admitting: Emergency Medicine

## 2017-08-07 ENCOUNTER — Emergency Department (HOSPITAL_BASED_OUTPATIENT_CLINIC_OR_DEPARTMENT_OTHER): Payer: Medicare Other

## 2017-08-07 ENCOUNTER — Encounter (HOSPITAL_COMMUNITY): Payer: Self-pay

## 2017-08-07 DIAGNOSIS — Z7984 Long term (current) use of oral hypoglycemic drugs: Secondary | ICD-10-CM | POA: Diagnosis not present

## 2017-08-07 DIAGNOSIS — E119 Type 2 diabetes mellitus without complications: Secondary | ICD-10-CM | POA: Diagnosis not present

## 2017-08-07 DIAGNOSIS — I110 Hypertensive heart disease with heart failure: Secondary | ICD-10-CM | POA: Insufficient documentation

## 2017-08-07 DIAGNOSIS — J449 Chronic obstructive pulmonary disease, unspecified: Secondary | ICD-10-CM | POA: Insufficient documentation

## 2017-08-07 DIAGNOSIS — I5032 Chronic diastolic (congestive) heart failure: Secondary | ICD-10-CM | POA: Diagnosis not present

## 2017-08-07 DIAGNOSIS — F17210 Nicotine dependence, cigarettes, uncomplicated: Secondary | ICD-10-CM | POA: Diagnosis not present

## 2017-08-07 DIAGNOSIS — M79605 Pain in left leg: Secondary | ICD-10-CM

## 2017-08-07 DIAGNOSIS — F909 Attention-deficit hyperactivity disorder, unspecified type: Secondary | ICD-10-CM | POA: Insufficient documentation

## 2017-08-07 DIAGNOSIS — Z79899 Other long term (current) drug therapy: Secondary | ICD-10-CM | POA: Insufficient documentation

## 2017-08-07 DIAGNOSIS — R2242 Localized swelling, mass and lump, left lower limb: Secondary | ICD-10-CM | POA: Diagnosis present

## 2017-08-07 DIAGNOSIS — M79609 Pain in unspecified limb: Secondary | ICD-10-CM

## 2017-08-07 LAB — CBC
HEMATOCRIT: 43.7 % (ref 36.0–46.0)
HEMOGLOBIN: 13.7 g/dL (ref 12.0–15.0)
MCH: 29 pg (ref 26.0–34.0)
MCHC: 31.4 g/dL (ref 30.0–36.0)
MCV: 92.4 fL (ref 78.0–100.0)
Platelets: 215 10*3/uL (ref 150–400)
RBC: 4.73 MIL/uL (ref 3.87–5.11)
RDW: 13.1 % (ref 11.5–15.5)
WBC: 9.1 10*3/uL (ref 4.0–10.5)

## 2017-08-07 LAB — BASIC METABOLIC PANEL
ANION GAP: 9 (ref 5–15)
BUN: 10 mg/dL (ref 8–23)
CHLORIDE: 105 mmol/L (ref 98–111)
CO2: 28 mmol/L (ref 22–32)
CREATININE: 0.85 mg/dL (ref 0.44–1.00)
Calcium: 9 mg/dL (ref 8.9–10.3)
GFR calc non Af Amer: >60 mL/min (ref >60)
Glucose, Bld: 119 mg/dL — ABNORMAL HIGH (ref 70–99)
POTASSIUM: 3.5 mmol/L (ref 3.5–5.1)
SODIUM: 142 mmol/L (ref 135–145)

## 2017-08-07 LAB — CBG MONITORING, ED: Glucose-Capillary: 121 mg/dL — ABNORMAL HIGH (ref 70–99)

## 2017-08-07 MED ORDER — GABAPENTIN 100 MG PO CAPS: 100.0000 mg | ORAL_CAPSULE | Freq: Three times a day (TID) | ORAL | 1 refills | Status: DC

## 2017-08-07 MED ORDER — HYDROCODONE-ACETAMINOPHEN 5-325 MG PO TABS
1.0000 | ORAL_TABLET | ORAL | Status: AC
  Administered 2017-08-07: 1 via ORAL
  Filled 2017-08-07: qty 1

## 2017-08-07 MED ORDER — ACETAMINOPHEN 325 MG PO TABS
325.0000 mg | ORAL_TABLET | Freq: Once | ORAL | Status: AC
  Administered 2017-08-07: 325 mg via ORAL
  Filled 2017-08-07: qty 1

## 2017-08-07 NOTE — Discharge Instructions (Addendum)
Take medications as prescribed, follow-up with your primary care doctor, monitor for fever, worsening symptoms

## 2017-08-07 NOTE — ED Triage Notes (Signed)
Pt states that she had a venous surgey on her right upper leg  about two months ago and for the past week has had swelling in her lower right leg with increasing pain.

## 2017-08-07 NOTE — ED Provider Notes (Signed)
Patient placed in Quick Look pathway, seen and evaluated   Chief Complaint: Left leg swelling  HPI:   Patient reports that one month ago she had vascular surgery on her left leg.  For the past two days she has been having swelling in her left leg and pain in the backback of her lower leg.  No trauma.   ROS: No fever (one)  Physical Exam:   Gen: No distress  Neuro: Awake and Alert  Skin: Warm    Focused Exam: Slight swelling in left lower extremity.     Initiation of care has begun. The patient has been counseled on the process, plan, and necessity for staying for the completion/evaluation, and the remainder of the medical screening examination    [name] 08/07/17 1645    Hayden Rasmussen, MD 08/08/17 1125

## 2017-08-07 NOTE — ED Notes (Signed)
Pt CBG 121 

## 2017-08-07 NOTE — ED Provider Notes (Signed)
[facility] EMERGENCY DEPARTMENT Provider Note   CSN: [id number] Arrival date & time: 08/07/17  1617     History   Chief Complaint Chief Complaint  Patient presents with  . Leg Swelling    HPI [name] is a 62 y.o. female.  HPI Patient has a history of femoropopliteal bypass back in April.  Patient states for the last week or so she started having swelling in the back of her leg associated with a burning discomfort that goes down the entire back of her left leg.  She denies any recent injuries.  No numbness.  No weakness.  No fevers or chills.  Patient states she tried to call her vascular doctor and was unable to be seen so she was instructed to come to the emergency room Past Medical History:  Diagnosis Date  . ADD (attention deficit disorder with hyperactivity)   . Anxiety   . Arthritis    "neck" (06/16/2015)  . Asthma   . Basal cell carcinoma, face   . Cervical cancer ([place])   . Chronic bronchitis ([place])   . 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 COPD (chronic obstructive pulmonary disease) ([place])   . Depression   . GERD (gastroesophageal reflux disease)   . Hypertension   . On home oxygen therapy    "2L at bedtime" (06/16/2015)  . PAF (paroxysmal atrial fibrillation) ([place])    04/18/17 in the setting of bronchodilators and intubation for hypercarbic respiratory failure  . Squamous cell carcinoma, face   . Thyroid cyst   . Type II diabetes mellitus [facility])     Patient Active Problem List   Diagnosis Date Noted  . PAD (peripheral artery disease) ([place]) 05/22/2017  . PAF (paroxysmal atrial fibrillation) ([place]) 05/15/2017  . Altered mental status 04/18/2017  . Acute encephalopathy 04/18/2017  . Respiratory failure ([place])   . 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 Chronic diastolic CHF (congestive heart failure) ([place]) 08/08/2016  . Protein-calorie malnutrition, moderate ([place]) 06/16/2015  . Hypokalemia  06/04/2015  . Orthostatic hypotension 06/04/2015  . Syncope 06/04/2015  . Head injury with loss of consciousness ([place]) 06/04/2015  . Dehydration 06/04/2015  . Accelerated hypertension 06/04/2015  . Syncope and collapse 06/04/2015  . Pyuria 06/04/2015  . Cigarette smoker 06/04/2015  . Diabetes mellitus without complication ([place]) 83/15/1761  . HLD (hyperlipidemia) 01/03/2014  . COPD exacerbation ([place]) 01/03/2014  . Depression   . Hypertension   . COPD GOLD II/ still smoking    . Asthma   . ADHD (attention deficit hyperactivity disorder)   . Chest pain 12/15/2013  . GERD (gastroesophageal reflux disease)   . Anxiety   . Bronchitis   . Skin lesion of right arm 10/20/2011  . Post op infection 03/24/2011  . Incisional hernia 01/20/2011  . Wound infection after surgery 09/09/2010    Past Surgical History:  Procedure Laterality Date  . ABDOMINAL AORTOGRAM N/A 04/05/2017   Procedure: ABDOMINAL AORTOGRAM;  Surgeon: Conrad Al[place], MD;  Location: [facility];  Service: Vascular;  Laterality: N/A;  . ABDOMINAL HYSTERECTOMY    . ANTERIOR CERVICAL DECOMP/DISCECTOMY FUSION    . AORTOGRAM Left 04/05/2017   Procedure: Angiogram with left leg runoff;  Surgeon: Conrad Buchanan, MD;  Location: [facility];  Service: Vascular;  Laterality: Left;  . BACK SURGERY    . BASAL CELL CARCINOMA EXCISION     "face"  . COLECTOMY    . EXCISIONAL HEMORRHOIDECTOMY    .  FEMORAL-POPLITEAL BYPASS GRAFT Left 05/22/2017   BYPASS GRAFT LEFT COMMON  FEMORAL-ABOVE THE KNEE POPLITEAL ARTERY USING 6 x 40 GORE PROPATEN VASCULAR GRAFT  . FEMORAL-POPLITEAL BYPASS GRAFT Left 05/22/2017   Procedure: BYPASS GRAFT LEFT COMMON  FEMORAL-ABOVE THE KNEE POPLITEAL ARTERY USING 6 x 40 GORE PROPATEN VASCULAR GRAFT;  Surgeon: Conrad Moran, MD;  Location: [facility];  Service: Vascular;  Laterality: Left;  . HERNIA REPAIR  X 3   incisional/ventral hernia repair "in my stomach where the womb is"  . LAPAROSCOPIC CHOLECYSTECTOMY    . OOPHORECTOMY  2006     cervical cancer  . RECTOVAGINAL FISTULA CLOSURE  2012  . SQUAMOUS CELL CARCINOMA EXCISION    . TUBAL LIGATION    . VULVA SURGERY  2006   Cancer in situ  . WRIST SURGERY Right X 1   "accidentially cut it"  . WRIST SURGERY Left    "don't remember why"     OB History   None      Home Medications    Prior to Admission medications   Medication Sig Start Date End Date Taking? Authorizing Provider  albuterol (PROVENTIL HFA;VENTOLIN HFA) 108 (90 BASE) MCG/ACT inhaler Inhale 2 puffs into the lungs 2 (two) times daily as needed for wheezing.    [provider]  albuterol (PROVENTIL) (2.5 MG/3ML) 0.083% nebulizer solution Take 3 mLs (2.5 mg total) by nebulization every 4 (four) hours as needed for wheezing or shortness of breath. 01/27/17   Patrecia Pour, MD  alprazolam Duanne Moron) 2 MG tablet Take 0.5 tablets (1 mg total) by mouth at bedtime as needed for sleep or anxiety (Can take up to 3 thimes daily prn). Patient takes 1/2 tablet by mouth four times a day Patient taking differently: Take 1-2 mg by mouth See admin instructions. 1 mg by mouth in the morning then 1 mg at noon(time) then 1 mg at 4 PM then 2 mg at bedtime 01/05/14   Theodis Blaze, MD  atorvastatin (LIPITOR) 20 MG tablet Take 20 mg by mouth at bedtime 01/16/17   [provider]  budesonide-formoterol (SYMBICORT) 160-4.5 MCG/ACT inhaler Inhale 2 puffs into the lungs 2 (two) times daily. 01/27/17   Patrecia Pour, MD  gabapentin (NEURONTIN) 100 MG capsule Take 1 capsule (100 mg total) by mouth 3 (three) times daily. 08/07/17   Dorie Rank, MD  glipiZIDE (GLUCOTROL) 10 MG tablet Take 10 mg by mouth 2 (two) times daily before a meal.     [provider]  HYDROcodone-acetaminophen (NORCO) 10-325 MG tablet Take 1-2 tablets by mouth every 4 (four) hours as needed for moderate pain. 05/26/17   Ulyses Amor, PA-C  metFORMIN (GLUCOPHAGE) 1000 MG tablet Take 1,000 mg by mouth two times a day 11/15/16   [provider]  metoprolol succinate (TOPROL-XL) 100 MG 24 hr tablet Take 100 mg by mouth once a day 01/16/17   [provider]  OXYGEN Inhale 3 L into the lungs continuous.    [provider]  REXULTI 4 MG TABS Take 4 mg by mouth every morning. 03/11/15   [provider]  tiotropium (SPIRIVA HANDIHALER) 18 MCG inhalation capsule Place 1 capsule (18 mcg total) into inhaler and inhale daily. 01/27/17   Patrecia Pour, MD  traMADol (ULTRAM) 50 MG tablet Take 1 tablet (50 mg total) by mouth every 6 (six) hours as needed. 05/07/17   Conrad Ryan, MD  vortioxetine HBr (TRINTELLIX) 20 MG TABS tablet Take 20 mg  by mouth daily.    [provider]  clomiPRAMINE (ANAFRANIL) 25 MG capsule Take 25 mg by mouth at bedtime.   05/08/11  [provider]    Family History Family History  Problem Relation Age of Onset  . COPD Father   . Cancer Father        lung  . COPD Mother   . Asthma Mother   . Cancer Paternal Aunt        lung    Social History Social History   Tobacco Use  . Smoking status: Current Every Day Smoker    Packs/day: 1.00    Years: 47.00    Pack years: 47.00    Types: Cigarettes  . Smokeless tobacco: Never Used  Substance Use Topics  . Alcohol use: No  . Drug use: No     Allergies   Contrast media [iodinated diagnostic agents]; Doxycycline; Erythromycin; Penicillins; Amoxicillin; and Metronidazole   Review of Systems Review of Systems  All other systems reviewed and are negative.    Physical Exam Updated Vital Signs BP (!) 152/81 (BP Location: Left Arm)   Pulse 70   Temp 98.4 F (36.9 C) (Oral)   Resp 16   Ht 1.651 m (5\' 5" )   Wt 64.9 kg (143 lb)   SpO2 94%   BMI 23.80 kg/m   Physical Exam  Constitutional: She appears well-developed and well-nourished. No distress.  HENT:  Head: Normocephalic and atraumatic.  Right Ear: External ear normal.  Left Ear: External ear normal.  Eyes: Conjunctivae are normal. Right  eye exhibits no discharge. Left eye exhibits no discharge. No scleral icterus.  Neck: Neck supple. No tracheal deviation present.  Cardiovascular: Normal rate, regular rhythm and intact distal pulses.  Pulmonary/Chest: Effort normal and breath sounds normal. No stridor. No respiratory distress. She has no wheezes. She has no rales.  Abdominal: Soft. Bowel sounds are normal. She exhibits no distension. There is no tenderness. There is no rebound and no guarding.  Musculoskeletal: She exhibits tenderness. She exhibits no edema.  ttp left lower leg and thigh, feet are warm and well-perfused, strong dorsalis pedis pulse in the bilateral lower extremities  Neurological: She is alert. She has normal strength. No cranial nerve deficit (no facial droop, extraocular movements intact, no slurred speech) or sensory deficit. She exhibits normal muscle tone. She displays no seizure activity. Coordination normal.  Skin: Skin is warm and dry. No rash noted.  Psychiatric: She has a normal mood and affect.  Nursing note and vitals reviewed.    ED Treatments / Results  Labs (all labs ordered are listed, but only abnormal results are displayed) Labs Reviewed  BASIC METABOLIC PANEL - Abnormal; Notable for the following components:      Result Value   Glucose, Bld 119 (*)    All other components within normal limits  CBG MONITORING, ED - Abnormal; Notable for the following components:   Glucose-Capillary 121 (*)    All other components within normal limits  CBC    EKG None  Radiology Left lower extremity venous duplex has been completed. Negative for DVT.  Results were given to Wyn Quaker PA.  08/07/17 5:20 PM Carlos Levering RVT    Procedures Procedures (including critical care time)  Medications Ordered in ED Medications  acetaminophen (TYLENOL) tablet 325 mg (325 mg Oral Given 08/07/17 1703)  HYDROcodone-acetaminophen (NORCO/VICODIN) 5-325 MG per tablet 1 tablet (1 tablet Oral Given  08/07/17 1900)     Initial Impression / Assessment  and Plan / ED Course  I have reviewed the triage vital signs and the nursing notes.  Pertinent labs & imaging results that were available during my care of the patient were reviewed by me and considered in my medical decision making (see chart for details).  Clinical Course as of Aug 08 1998  Tue Aug 07, 2017  [place] PMP aware.  Patient filled 25 days worth of 10 mg hydrocodone's on June 13   [JK]    Clinical Course User Index [JK] Dorie Rank, MD    Patient presented to the ED for evaluation of lower extremity pain.  He has a history of prior vascular surgery.  On exam she has good perfusion and strong pulses.  Doppler studies does not show any evidence of DVT.  No findings on exam to suggest infection.  Patient may be having some radicular symptoms as she describes a burning discomfort in her leg.  Plan on discharge home with Neurontin.  Discussed outpatient follow-up with her primary care doctor.  Final Clinical Impressions(s) / ED Diagnoses   Final diagnoses:  Pain of left lower extremity    ED Discharge Orders        Ordered    gabapentin (NEURONTIN) 100 MG capsule  3 times daily     08/07/17 1959       Dorie Rank, MD 08/07/17 2001

## 2017-08-07 NOTE — Progress Notes (Signed)
Left lower extremity venous duplex has been completed. Negative for DVT.  Results were given to Wyn Quaker PA.  08/07/17 5:20 PM Carlos Levering RVT

## 2017-08-21 ENCOUNTER — Telehealth: Payer: Self-pay | Admitting: Internal Medicine

## 2017-08-21 NOTE — Telephone Encounter (Signed)
Please notify patient that I'm happy to accept her as a new patient but we may need to send her to management for chronic pain as I typically don't manage. We can discuss this in greater detail during her new patient appointment.

## 2017-08-21 NOTE — Telephone Encounter (Signed)
Left message asking pt to call the office   Copied from [place] [id number]. Topic: Appointment Scheduling - Scheduling Inquiry for Clinic >> Aug 21, 2017  2:55 PM Scherrie Gerlach wrote: Reason for CRM: Kendrice with [facility] calling to request pt get a new pcp, as she does not feel her current one is doing much for the pt. She has reached out for the pt, pt is there now. Kendrice asking if there is anyway to work the pt in sooner.  Pt has several health issues. New onset issues of back pain.  Pt is on gabapentin and hydrocodone, which is not working.  Pt will need a new pt packet as well.   Kendrice phone [phone]

## 2017-08-21 NOTE — Telephone Encounter (Signed)
Patient has new patient's appt on 09/28/2017

## 2017-08-22 NOTE — Telephone Encounter (Signed)
Left message asking pt to call office  °

## 2017-08-22 NOTE — Telephone Encounter (Signed)
Please notify patient that I'm happy to accept her as a new patient but we may need to send her to management for chronic pain as I typically don't manage. We can discuss this in greater detail during her new patient appointment.

## 2017-08-23 NOTE — Telephone Encounter (Signed)
Left message asking pt to call office  °

## 2017-08-28 NOTE — Progress Notes (Deleted)
    Postoperative Visit   History of Present Illness   [name] is a 62 y.o. year old female who presents for postoperative follow-up for: L CFA to BK pop BPG w/ Propaten (05/22/17).  The patient's wounds are *** healed.  The patient notes *** resolution of lower extremity symptoms.  The patient is *** able to complete their activities of daily living.  The patient's current symptoms are: ***.   For VQI Use Only   PRE-ADM LIVING: {VQI Pre-admission Living:20973}  AMB STATUS: {VQI Ambulatory Status:20974}   Physical Examination  ***There were no vitals filed for this visit.  LLE: Incisions are *** healed, wounds are *** healed, pedal pulses are ***   Medical Decision Making   [name] is a 62 y.o. year old female who presents s/p L fem-pop BPG w/ Propaten, persistent L leg pain of unknown etiology   The patient's bypass incisions are *** healing appropriately with resolution of pre-operative symptoms. I discussed in depth with the patient the nature of atherosclerosis, and emphasized the importance of maximal medical management including strict control of blood pressure, blood glucose, and lipid levels, obtaining regular exercise, and cessation of smoking.  The patient is aware that without maximal medical management the underlying atherosclerotic disease process will progress, limiting the benefit of any interventions. The patient's surveillance will included ABI and bypass duplex studies which will be completed in: *** months, at which time the patient will be re-evaluated.   I emphasized the importance of routine surveillance of the patient's bypass, as the vascular surgery literature emphasize the improved patency possible with assisted primary patency procedures versus secondary patency procedures. The patient agrees to participate in their maximal medical care and routine surveillance. Thank you for allowing [place] to participate in this patient's care.   Adele Barthel, MD, FACS [facility] Office: [phone] Pager: [phone]

## 2017-08-31 ENCOUNTER — Emergency Department (HOSPITAL_COMMUNITY): Payer: Medicare Other

## 2017-08-31 ENCOUNTER — Encounter: Payer: Medicare Other | Admitting: Vascular Surgery

## 2017-08-31 ENCOUNTER — Other Ambulatory Visit: Payer: Self-pay

## 2017-08-31 ENCOUNTER — Emergency Department (HOSPITAL_COMMUNITY)
Admission: EM | Admit: 2017-08-31 | Discharge: 2017-08-31 | Disposition: A | Discharge status: Home / Self Care | Payer: Medicare Other | Attending: Emergency Medicine | Admitting: Emergency Medicine

## 2017-08-31 DIAGNOSIS — Z85828 Personal history of other malignant neoplasm of skin: Secondary | ICD-10-CM | POA: Insufficient documentation

## 2017-08-31 DIAGNOSIS — M5432 Sciatica, left side: Secondary | ICD-10-CM | POA: Diagnosis not present

## 2017-08-31 DIAGNOSIS — F17210 Nicotine dependence, cigarettes, uncomplicated: Secondary | ICD-10-CM | POA: Insufficient documentation

## 2017-08-31 DIAGNOSIS — Z8541 Personal history of malignant neoplasm of cervix uteri: Secondary | ICD-10-CM | POA: Insufficient documentation

## 2017-08-31 DIAGNOSIS — J449 Chronic obstructive pulmonary disease, unspecified: Secondary | ICD-10-CM | POA: Insufficient documentation

## 2017-08-31 DIAGNOSIS — I110 Hypertensive heart disease with heart failure: Secondary | ICD-10-CM | POA: Diagnosis not present

## 2017-08-31 DIAGNOSIS — I5032 Chronic diastolic (congestive) heart failure: Secondary | ICD-10-CM | POA: Diagnosis not present

## 2017-08-31 DIAGNOSIS — E119 Type 2 diabetes mellitus without complications: Secondary | ICD-10-CM | POA: Insufficient documentation

## 2017-08-31 DIAGNOSIS — M545 Low back pain: Secondary | ICD-10-CM | POA: Diagnosis present

## 2017-08-31 DIAGNOSIS — Z79899 Other long term (current) drug therapy: Secondary | ICD-10-CM | POA: Insufficient documentation

## 2017-08-31 LAB — CBC WITH DIFFERENTIAL/PLATELET
Abs Immature Granulocytes: 0 10*3/uL (ref 0.0–0.1)
Basophils Absolute: 0.1 10*3/uL (ref 0.0–0.1)
Basophils Relative: 1 %
Eosinophils Absolute: 0.2 10*3/uL (ref 0.0–0.7)
Eosinophils Relative: 2 %
HCT: 45.5 % (ref 36.0–46.0)
Hemoglobin: 14.2 g/dL (ref 12.0–15.0)
Immature Granulocytes: 0 %
Lymphocytes Relative: 38 %
Lymphs Abs: 2.9 10*3/uL (ref 0.7–4.0)
MCH: 28.1 pg (ref 26.0–34.0)
MCHC: 31.2 g/dL (ref 30.0–36.0)
MCV: 89.9 fL (ref 78.0–100.0)
MONO ABS: 0.4 10*3/uL (ref 0.1–1.0)
Monocytes Relative: 6 %
Neutro Abs: 4 10*3/uL (ref 1.7–7.7)
Neutrophils Relative %: 53 %
Platelets: 173 10*3/uL (ref 150–400)
RBC: 5.06 MIL/uL (ref 3.87–5.11)
RDW: 12.9 % (ref 11.5–15.5)
WBC: 7.6 10*3/uL (ref 4.0–10.5)

## 2017-08-31 LAB — COMPREHENSIVE METABOLIC PANEL
ALT: 10 U/L (ref 0–44)
ANION GAP: 10 (ref 5–15)
AST: 13 U/L — ABNORMAL LOW (ref 15–41)
Albumin: 3.3 g/dL — ABNORMAL LOW (ref 3.5–5.0)
Alkaline Phosphatase: 60 U/L (ref 38–126)
BUN: 8 mg/dL (ref 8–23)
CO2: 28 mmol/L (ref 22–32)
Calcium: 9.1 mg/dL (ref 8.9–10.3)
Chloride: 102 mmol/L (ref 98–111)
Creatinine, Ser: 0.84 mg/dL (ref 0.44–1.00)
GFR calc Af Amer: >60 mL/min (ref >60)
GFR calc non Af Amer: >60 mL/min (ref >60)
GLUCOSE: 182 mg/dL — AB (ref 70–99)
Potassium: 3.6 mmol/L (ref 3.5–5.1)
Sodium: 140 mmol/L (ref 135–145)
Total Bilirubin: 0.4 mg/dL (ref 0.3–1.2)
Total Protein: 6.6 g/dL (ref 6.5–8.1)

## 2017-08-31 MED ORDER — MORPHINE SULFATE (PF) 4 MG/ML IV SOLN
4.0000 mg | Freq: Once | INTRAVENOUS | Status: AC
  Administered 2017-08-31: 4 mg via INTRAVENOUS
  Filled 2017-08-31: qty 1

## 2017-08-31 MED ORDER — PREDNISONE 50 MG PO TABS: 50.0000 mg | ORAL_TABLET | Freq: Every day | ORAL | 0 refills | Status: DC

## 2017-08-31 MED ORDER — OXYCODONE-ACETAMINOPHEN 5-325 MG PO TABS
1.0000 | ORAL_TABLET | Freq: Once | ORAL | Status: AC
  Administered 2017-08-31: 1 via ORAL
  Filled 2017-08-31: qty 1

## 2017-08-31 NOTE — ED Notes (Signed)
ED Provider at bedside. 

## 2017-08-31 NOTE — ED Notes (Signed)
**   morphine was given IM in right upper outer quadrant

## 2017-08-31 NOTE — Discharge Instructions (Addendum)
Return here if needed.  Follow-up with your primary doctor.

## 2017-08-31 NOTE — ED Notes (Signed)
Patient transported to MRI 

## 2017-08-31 NOTE — ED Notes (Signed)
Family member states he would like to talk with charge RN because he needs his name on the letter. Generic letter provided is "not good enough".

## 2017-08-31 NOTE — ED Provider Notes (Signed)
[facility] EMERGENCY DEPARTMENT Provider Note   CSN: [id number] Arrival date & time: 08/31/17  0553     History   Chief Complaint Chief Complaint  Patient presents with  . Wound Check    HPI [name] is a 62 y.o. female.  HPI Patient presents to the emergency department with chronic back pain.  The patient states she did have a fall where she slid out of bed yesterday landing on her bottom.  The patient states that she did not hit her head.  She states that nothing seems to make her condition better but movement and palpation makes the pain worse.  She has pain that radiates to her back into her leg.  Patient states she was due to have an MRI due to this chronic pain at the end of the month.  The patient denies chest pain, shortness of breath, headache,blurred vision, neck pain, fever, cough, weakness, numbness, dizziness, anorexia, edema, abdominal pain, nausea, vomiting, diarrhea, rash,  dysuria, hematemesis, bloody stool, near syncope, or syncope. Past Medical History:  Diagnosis Date  . ADD (attention deficit disorder with hyperactivity)   . Anxiety   . Arthritis    "neck" (06/16/2015)  . Asthma   . Basal cell carcinoma, face   . Cervical cancer ([place])   . Chronic bronchitis ([place])   . 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 COPD (chronic obstructive pulmonary disease) ([place])   . Depression   . GERD (gastroesophageal reflux disease)   . Hypertension   . On home oxygen therapy    "2L at bedtime" (06/16/2015)  . PAF (paroxysmal atrial fibrillation) ([place])    04/18/17 in the setting of bronchodilators and intubation for hypercarbic respiratory failure  . Squamous cell carcinoma, face   . Thyroid cyst   . Type II diabetes mellitus [facility])     Patient Active Problem List   Diagnosis Date Noted  . PAD (peripheral artery disease) ([place]) 05/22/2017  . PAF (paroxysmal atrial fibrillation) ([place]) 05/15/2017  . Altered mental status 04/18/2017  . Acute encephalopathy  04/18/2017  . Respiratory failure ([place])   . 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 Chronic diastolic CHF (congestive heart failure) ([place]) 08/08/2016  . Protein-calorie malnutrition, moderate ([place]) 06/16/2015  . Hypokalemia 06/04/2015  . Orthostatic hypotension 06/04/2015  . Syncope 06/04/2015  . Head injury with loss of consciousness ([place]) 06/04/2015  . Dehydration 06/04/2015  . Accelerated hypertension 06/04/2015  . Syncope and collapse 06/04/2015  . Pyuria 06/04/2015  . Cigarette smoker 06/04/2015  . Diabetes mellitus without complication ([place]) 29/93/7169  . HLD (hyperlipidemia) 01/03/2014  . COPD exacerbation ([place]) 01/03/2014  . Depression   . Hypertension   . COPD GOLD II/ still smoking    . Asthma   . ADHD (attention deficit hyperactivity disorder)   . Chest pain 12/15/2013  . GERD (gastroesophageal reflux disease)   . Anxiety   . Bronchitis   . Skin lesion of right arm 10/20/2011  . Post op infection 03/24/2011  . Incisional hernia 01/20/2011  . Wound infection after surgery 09/09/2010    Past Surgical History:  Procedure Laterality Date  . ABDOMINAL AORTOGRAM N/A 04/05/2017   Procedure: ABDOMINAL AORTOGRAM;  Surgeon: Conrad [place], MD;  Location: [facility];  Service: Vascular;  Laterality: N/A;  . ABDOMINAL HYSTERECTOMY    . ANTERIOR CERVICAL DECOMP/DISCECTOMY FUSION    . AORTOGRAM Left 04/05/2017   Procedure: Angiogram with left leg runoff;  Surgeon: Conrad [place],  MD;  Location: [facility];  Service: Vascular;  Laterality: Left;  . BACK SURGERY    . BASAL CELL CARCINOMA EXCISION     "face"  . COLECTOMY    . EXCISIONAL HEMORRHOIDECTOMY    . FEMORAL-POPLITEAL BYPASS GRAFT Left 05/22/2017   BYPASS GRAFT LEFT COMMON  FEMORAL-ABOVE THE KNEE POPLITEAL ARTERY USING 6 x 40 GORE PROPATEN VASCULAR GRAFT  . FEMORAL-POPLITEAL BYPASS GRAFT Left 05/22/2017   Procedure: BYPASS GRAFT LEFT COMMON  FEMORAL-ABOVE THE KNEE POPLITEAL ARTERY  USING 6 x 40 GORE PROPATEN VASCULAR GRAFT;  Surgeon: Conrad [place], MD;  Location: [facility];  Service: Vascular;  Laterality: Left;  . HERNIA REPAIR  X 3   incisional/ventral hernia repair "in my stomach where the womb is"  . LAPAROSCOPIC CHOLECYSTECTOMY    . OOPHORECTOMY  2006   cervical cancer  . RECTOVAGINAL FISTULA CLOSURE  2012  . SQUAMOUS CELL CARCINOMA EXCISION    . TUBAL LIGATION    . VULVA SURGERY  2006   Cancer in situ  . WRIST SURGERY Right X 1   "accidentially cut it"  . WRIST SURGERY Left    "don't remember why"     OB History   None      Home Medications    Prior to Admission medications   Medication Sig Start Date End Date Taking? Authorizing Provider  alprazolam Duanne Moron) 2 MG tablet Take 0.5 tablets (1 mg total) by mouth at bedtime as needed for sleep or anxiety (Can take up to 3 thimes daily prn). Patient takes 1/2 tablet by mouth four times a day Patient taking differently: Take 1-2 mg by mouth See admin instructions. 1 mg by mouth in the morning then 1 mg at noon(time) then 1 mg at 4 PM then 2 mg at bedtime 01/05/14  Yes Theodis Blaze, MD  gabapentin (NEURONTIN) 100 MG capsule Take 1 capsule (100 mg total) by mouth 3 (three) times daily. 08/07/17  Yes Dorie Rank, MD  HYDROcodone-acetaminophen [facility]) 10-325 MG tablet Take 1-2 tablets by mouth every 4 (four) hours as needed for moderate pain. 05/26/17  Yes Ulyses Amor, PA-C  ibuprofen (ADVIL,MOTRIN) 800 MG tablet Take 800 mg by mouth 3 (three) times daily as needed. 08/15/17  Yes [provider]  OXYGEN Inhale 2 L into the lungs at bedtime.    Yes [provider]  albuterol (PROVENTIL) (2.5 MG/3ML) 0.083% nebulizer solution Take 3 mLs (2.5 mg total) by nebulization every 4 (four) hours as needed for wheezing or shortness of breath. Patient not taking: Reported on 08/31/2017 01/27/17   Patrecia Pour, MD  budesonide-formoterol [facility]) 160-4.5 MCG/ACT inhaler Inhale 2 puffs into the lungs 2 (two)  times daily. Patient not taking: Reported on 08/31/2017 01/27/17   Patrecia Pour, MD  tiotropium (SPIRIVA HANDIHALER) 18 MCG inhalation capsule Place 1 capsule (18 mcg total) into inhaler and inhale daily. Patient not taking: Reported on 08/31/2017 01/27/17   Patrecia Pour, MD  traMADol (ULTRAM) 50 MG tablet Take 1 tablet (50 mg total) by mouth every 6 (six) hours as needed. Patient not taking: Reported on 08/31/2017 05/07/17   Conrad [place], MD  clomiPRAMINE (ANAFRANIL) 25 MG capsule Take 25 mg by mouth at bedtime.   05/08/11  [provider]    Family History Family History  Problem Relation Age of Onset  . COPD Father   . Cancer Father        lung  . COPD Mother   . Asthma  Mother   . Cancer Paternal Aunt        lung    Social History Social History   Tobacco Use  . Smoking status: Current Every Day Smoker    Packs/day: 1.00    Years: 47.00    Pack years: 47.00    Types: Cigarettes  . Smokeless tobacco: Never Used  Substance Use Topics  . Alcohol use: No  . Drug use: No     Allergies   Contrast media [iodinated diagnostic agents]; Doxycycline; Erythromycin; Penicillins; Amoxicillin; and Metronidazole   Review of Systems Review of Systems All other systems negative except as documented in the HPI. All pertinent positives and negatives as reviewed in the HPI.  Physical Exam Updated Vital Signs BP (!) 196/85   Pulse 70   Temp 98.1 F (36.7 C) (Oral)   Resp (!) 21   Ht 5\' 5"  (1.651 m)   Wt 63.5 kg (140 lb)   SpO2 95%   BMI 23.30 kg/m   Physical Exam  Constitutional: She is oriented to person, place, and time. She appears well-developed and well-nourished. No distress.  HENT:  Head: Normocephalic and atraumatic.  Mouth/Throat: Oropharynx is clear and moist.  Eyes: Pupils are equal, round, and reactive to light.  Neck: Normal range of motion. Neck supple.  Cardiovascular: Normal rate, regular rhythm and normal heart sounds. Exam reveals no gallop and  no friction rub.  No murmur heard. Pulmonary/Chest: Effort normal and breath sounds normal. No respiratory distress. She has no wheezes.  Abdominal: Soft. Bowel sounds are normal. She exhibits no distension. There is no tenderness.  Patient has a chronic wound on her abdomen  Musculoskeletal:       Lumbar back: She exhibits tenderness. She exhibits normal range of motion, no bony tenderness, no swelling and no edema.       Back:  Neurological: She is alert and oriented to person, place, and time. She displays normal reflexes. No sensory deficit. She exhibits normal muscle tone. Coordination normal.  Skin: Skin is warm and dry. Capillary refill takes less than 2 seconds. No rash noted. No erythema.  Psychiatric: She has a normal mood and affect. Her behavior is normal.  Nursing note and vitals reviewed.    ED Treatments / Results  Labs (all labs ordered are listed, but only abnormal results are displayed) Labs Reviewed  COMPREHENSIVE METABOLIC PANEL - Abnormal; Notable for the following components:      Result Value   Glucose, Bld 182 (*)    Albumin 3.3 (*)    AST 13 (*)    All other components within normal limits  CBC WITH DIFFERENTIAL/PLATELET    EKG None  Radiology Mr Lumbar Spine Wo Contrast  Result Date: 08/31/2017 CLINICAL DATA:  Lower abdominal and back pain after fall tonight. Abdominal wound dehiscence. EXAM: MRI LUMBAR SPINE WITHOUT CONTRAST TECHNIQUE: Multiplanar, multisequence MR imaging of the lumbar spine was performed. No intravenous contrast was administered. COMPARISON:  Abdominal radiographs January 24, 2017 and CT pelvis August 31, 2017 FINDINGS: SEGMENTATION: For the purposes of this report, the last well-formed intervertebral disc is reported as L5-S1. ALIGNMENT: Maintained lumbar lordosis. Minimal grade 1 L3-4 and L4-5 retrolisthesis. No spondylolysis. VERTEBRAE:Vertebral bodies are intact. Moderate to severe L5-S1 disc height loss, moderate L4-5 and mild L2-3  and L3-4 disc height loss. Mild disc desiccation all lumbar levels with multilevel mild chronic discogenic endplate changes and old Schmorl's nodes. No acute or abnormal bone marrow signal. CONUS MEDULLARIS AND CAUDA EQUINA: Conus  medullaris terminates at T12-L1 and demonstrates normal morphology and signal characteristics. Cauda equina is normal. PARASPINAL AND OTHER SOFT TISSUES: Included prevertebral and paraspinal soft tissues are normal. DISC LEVELS: T12-L1: No disc bulge, canal stenosis nor neural foraminal narrowing. L1-2: Small RIGHT extraforaminal disc protrusion with annular fissure. No new disc bulge, canal stenosis or neural foraminal narrowing. L2-3: Small broad-based disc bulge asymmetric to the RIGHT. Minimal facet arthropathy and ligamentum flavum redundancy without canal stenosis. Mild RIGHT neural foraminal narrowing. L3-4: Retrolisthesis. Annular bulging. Mild facet arthropathy and ligamentum flavum redundancy without canal stenosis. Mild RIGHT, minimal LEFT neural foraminal narrowing. L4-5: Retrolisthesis. Small broad-based disc bulge. LEFT extraforaminal exited LEFT L4 nerve root appears mildly enlarged edematous. Mild facet arthropathy and ligamentum flavum redundancy. No canal stenosis. Moderate RIGHT, moderate to severe LEFT neural foraminal narrowing. L5-S1: Small broad-based disc bulge. Moderate facet arthropathy. No canal stenosis. Mild RIGHT, moderate to severe LEFT neural foraminal narrowing. IMPRESSION: 1. Grade 1 L3-4 and L4-5 retrolisthesis on degenerative basis. No acute osseous process. 2. Exited LEFT L4 suspected neuritis. 3. No canal stenosis. Neural foraminal narrowing L2-3 through L5-S1: Moderate to severe on the LEFT at L4-5 and L5-S1. Electronically Signed   By: Elon Alas M.D.   On: 08/31/2017 13:48   Dg Hip Unilat With Pelvis 2-3 Views Left  Result Date: 08/31/2017 CLINICAL DATA:  62 y/o  F; fall 2 days ago with left hip pain. EXAM: DG HIP (WITH OR WITHOUT PELVIS)  2-3V LEFT COMPARISON:  06/04/2015 pelvic and left hip radiographs FINDINGS: There is no evidence of hip fracture or dislocation. There is no evidence of arthropathy or other focal bone abnormality. IMPRESSION: No acute fracture or dislocation identified. If clinical concern for fracture persists consider CT or MRI of the hip for greater sensitivity. Electronically Signed   By: Kristine Garbe M.D.   On: 08/31/2017 06:36    Procedures Procedures (including critical care time)  Medications Ordered in ED Medications  oxyCODONE-acetaminophen (PERCOCET/ROXICET) 5-325 MG per tablet 1 tablet (1 tablet Oral Given 08/31/17 0843)  morphine 4 MG/ML injection 4 mg (4 mg Intravenous Given 08/31/17 1258)     Initial Impression / Assessment and Plan / ED Course  I have reviewed the triage vital signs and the nursing notes.  Pertinent labs & imaging results that were available during my care of the patient were reviewed by me and considered in my medical decision making (see chart for details).  Clinical Course as of Aug 31 1509  Fri Aug 31, 2017  1226 DG Hip Unilat With Pelvis 2-3 Views Left [NL]    Clinical Course User Index [NL] Junious Dresser, Student-PA   Patient has no neurological deficits noted on lower extremity.  The patient has chronic lower back issues and the's MRI shows that she has some inflammation of the nerve in the L4 region.  We will treat with medications for this.  We will have the patient follow-up with her primary doctor.  Told return here as needed.  Patient agrees the plan and all questions were answered.  Final Clinical Impressions(s) / ED Diagnoses   Final diagnoses:  None    ED Discharge Orders    None       Dalia Heading, PA-C 08/31/17 1514    Little, Wenda Overland, MD 09/01/17 1549

## 2017-08-31 NOTE — ED Notes (Signed)
Family member came out of room after recently arriving and wanted a letter stating here was here at 3:00am this morning. I explained to the family member that this is not something we can give him, and that there is no one here at this time that was here at 3:00am. Family member also wanted to get a copy of his medical records by 3:00pm today "for his lawyer". Explained to family member where medical records is.

## 2017-08-31 NOTE — ED Triage Notes (Signed)
Patient c/o of an open wound to abdomen (states that her wound had closed there and then opened up after she fell on it tonight). Also c/o left lower back/hip pain after falling that radiates down leg.

## 2017-09-13 ENCOUNTER — Other Ambulatory Visit: Payer: Self-pay | Admitting: Primary Care

## 2017-09-13 ENCOUNTER — Encounter

## 2017-09-13 ENCOUNTER — Ambulatory Visit (INDEPENDENT_AMBULATORY_CARE_PROVIDER_SITE_OTHER): Payer: Medicare Other | Admitting: Primary Care

## 2017-09-13 ENCOUNTER — Encounter (INDEPENDENT_AMBULATORY_CARE_PROVIDER_SITE_OTHER): Payer: Self-pay

## 2017-09-13 ENCOUNTER — Encounter: Payer: Self-pay | Admitting: Primary Care

## 2017-09-13 VITALS — BP 190/100 | HR 68 | Temp 98.0°F | Wt 124.2 lb

## 2017-09-13 DIAGNOSIS — I70212 Atherosclerosis of native arteries of extremities with intermittent claudication, left leg: Secondary | ICD-10-CM

## 2017-09-13 DIAGNOSIS — I70219 Atherosclerosis of native arteries of extremities with intermittent claudication, unspecified extremity: Secondary | ICD-10-CM | POA: Diagnosis not present

## 2017-09-13 DIAGNOSIS — I5032 Chronic diastolic (congestive) heart failure: Secondary | ICD-10-CM

## 2017-09-13 DIAGNOSIS — F329 Major depressive disorder, single episode, unspecified: Secondary | ICD-10-CM

## 2017-09-13 DIAGNOSIS — M5442 Lumbago with sciatica, left side: Secondary | ICD-10-CM

## 2017-09-13 DIAGNOSIS — E119 Type 2 diabetes mellitus without complications: Secondary | ICD-10-CM

## 2017-09-13 DIAGNOSIS — J449 Chronic obstructive pulmonary disease, unspecified: Secondary | ICD-10-CM

## 2017-09-13 DIAGNOSIS — I10 Essential (primary) hypertension: Secondary | ICD-10-CM

## 2017-09-13 DIAGNOSIS — I480 Paroxysmal atrial fibrillation: Secondary | ICD-10-CM | POA: Diagnosis not present

## 2017-09-13 DIAGNOSIS — E785 Hyperlipidemia, unspecified: Secondary | ICD-10-CM

## 2017-09-13 DIAGNOSIS — I739 Peripheral vascular disease, unspecified: Secondary | ICD-10-CM

## 2017-09-13 DIAGNOSIS — F32A Depression, unspecified: Secondary | ICD-10-CM

## 2017-09-13 DIAGNOSIS — G8929 Other chronic pain: Secondary | ICD-10-CM | POA: Diagnosis not present

## 2017-09-13 DIAGNOSIS — F419 Anxiety disorder, unspecified: Secondary | ICD-10-CM

## 2017-09-13 MED ORDER — LOSARTAN POTASSIUM-HCTZ 50-12.5 MG PO TABS: ORAL_TABLET | ORAL | 0 refills | Status: DC

## 2017-09-13 NOTE — Assessment & Plan Note (Signed)
Echo from admission in March 2019 (accidental overdose) with LVEF of 60-65% with normal wall motion, grade 1 diastolic dysfunction.   Appears euvolemic today. No longer on beta blocker.

## 2017-09-13 NOTE — Assessment & Plan Note (Signed)
Following with psychiatry, only taking Xanax. Once on Rexulti and Trintellix per records.

## 2017-09-13 NOTE — Assessment & Plan Note (Signed)
Following with psychiatry. She is only taking Xanax for symptoms, this is most certainly not best practice. Based off of her chart she was once on Rexulti and Trintellix, she did not endorse taking these now. She does have a history of accidental overdose.   Will clarify next visit.

## 2017-09-13 NOTE — Progress Notes (Signed)
Subjective:    Patient ID: [name], female    DOB: Feb 18, 1955, 62 y.o.   MRN: [id number]  HPI  [name] is a 62 year old female who presents today to establish care and discuss the problems mentioned below. Will obtain old records.  1) Hypertension: Currently not managed on antihypertensives. She endorses being on blood pressure medication in the past but has been off for "about one year" as she didn't want to take any longer. She denies dizziness, chest pain, headaches. She attributes her hypertension to pain.   BP Readings from Last 3 Encounters:  09/13/17 (!) 190/100  08/31/17 (!) 209/100  08/07/17 (!) 164/78    2) Depression/Anxiety: Currently managed on Xanax 2 mg (1/2 TID, 1 full tablet HS), and Wellbutrin that was recently added. She is not sure of the dose. She is following with psychiatry every 2 months on average.   3) Paroxysmal Atrial Fibrillation: Unsure of this diagnosis. She is not taking aspirin or anticoagulants.   4) COPD: History of respiratory failure,  COPD exacerbation, asthma, bronchitis. Current smoker of cigarettes, smoker since the age of 89. She was once prescribed Symbicort, Spiriva, and albuterol for which she hasn't taken in "about one year". She denies shortness of breath and wheezing.   5) Atherosclerosis: History of bilateral common femoral artery cannulation, placement of catheter in aorta, aortogram, left leg runoff via left femoral sheath in April 2019. Once managed on atorvastatin for which she stopped several months ago. She was following with Dr. Bridgett Larsson through vascular surgery, has not followed up since her hospital visit. She endorses continued left lower extremity pain for which is constant, daily. Her family was notified prior to her surgery that the operation may not resolve her extremity pain.   Presented to [facility] in late June with complaints of left lower extremity pain and swelling. She had a stable exam so she was treated with  gabapentin 100 mg TID and told to follow up with vascular surgery.  6) Type 2 Diabetes: Currently managed on Metformin 1000 mg BID. Her last A1C was 6.1 in April 2019.  7) Chronic Back Pain: History of pain to her left lower back pain with radiation of pain to her feet. Her pain is daily. She also reports numbness/tingling to her left lower extremity. She is prescribed hydrocodone 10-325 mg for which she's taking every 6 hours without improvement in pain. She is also prescribed gabapentin 100 mg, endorses she takes this every 4 hours. This was recently added.   Recently evaluated in the [facility] ED for acute on chronic back pain after she slid out of her bed landing on her buttocks the day prior. She underwent MRI of her lower spine which showed inflammation of the nerve in the L4 region, also with numerous bulging discs. She was treated with Morphine and Percocet in the ED.  Review of Systems  Eyes: Negative for visual disturbance.  Respiratory: Negative for shortness of breath.   Cardiovascular: Negative for chest pain.  Gastrointestinal: Negative for vomiting.  Genitourinary:       Hysterectomy   Musculoskeletal: Positive for arthralgias and back pain.  Skin: Negative for color change.  Neurological: Negative for dizziness and headaches.  Hematological: Negative for adenopathy.  Psychiatric/Behavioral:       Chronic anxiety, following with psychiatry       Past Medical History:  Diagnosis Date  . ADD (attention deficit disorder with hyperactivity)   . Anxiety   . Arthritis    "  neck" (06/16/2015)  . Asthma   . Basal cell carcinoma, face   . Cervical cancer ([place])   . Chronic bronchitis ([place])   . 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 COPD (chronic obstructive pulmonary disease) ([place])   . Depression   . GERD (gastroesophageal reflux disease)   . Hypertension   . On home oxygen therapy    "2L at bedtime" (06/16/2015)  . PAF (paroxysmal atrial fibrillation) ([place])    04/18/17 in the  setting of bronchodilators and intubation for hypercarbic respiratory failure  . Squamous cell carcinoma, face   . Thyroid cyst   . Type II diabetes mellitus ([place])      Social History   Socioeconomic History  . Marital status: Widowed    Spouse name: Not on file  . Number of children: Not on file  . Years of education: Not on file  . Highest education level: Not on file  Occupational History  . Not on file  Social Needs  . Financial resource strain: Not on file  . Food insecurity:    Worry: Not on file    Inability: Not on file  . Transportation needs:    Medical: Not on file    Non-medical: Not on file  Tobacco Use  . Smoking status: Current Every Day Smoker    Packs/day: 1.00    Years: 47.00    Pack years: 47.00    Types: Cigarettes  . Smokeless tobacco: Never Used  Substance and Sexual Activity  . Alcohol use: No  . Drug use: No  . Sexual activity: Never  Lifestyle  . Physical activity:    Days per week: Not on file    Minutes per session: Not on file  . Stress: Not on file  Relationships  . Social connections:    Talks on phone: Not on file    Gets together: Not on file    Attends religious service: Not on file    Active member of club or organization: Not on file    Attends meetings of clubs or organizations: Not on file    Relationship status: Not on file  . Intimate partner violence:    Fear of current or ex partner: Not on file    Emotionally abused: Not on file    Physically abused: Not on file    Forced sexual activity: Not on file  Other Topics Concern  . Not on file  Social History Narrative   Lives with son.     Past Surgical History:  Procedure Laterality Date  . ABDOMINAL AORTOGRAM N/A 04/05/2017   Procedure: ABDOMINAL AORTOGRAM;  Surgeon: Conrad Yakima, MD;  Location: [facility];  Service: Vascular;  Laterality: N/A;  . ABDOMINAL HYSTERECTOMY    . ANTERIOR CERVICAL DECOMP/DISCECTOMY FUSION    . AORTOGRAM Left 04/05/2017   Procedure: Angiogram  with left leg runoff;  Surgeon: Conrad [place] Ivy, MD;  Location: [facility];  Service: Vascular;  Laterality: Left;  . BACK SURGERY    . BASAL CELL CARCINOMA EXCISION     "face"  . COLECTOMY    . EXCISIONAL HEMORRHOIDECTOMY    . FEMORAL-POPLITEAL BYPASS GRAFT Left 05/22/2017   BYPASS GRAFT LEFT COMMON  FEMORAL-ABOVE THE KNEE POPLITEAL ARTERY USING 6 x 40 GORE PROPATEN VASCULAR GRAFT  . FEMORAL-POPLITEAL BYPASS GRAFT Left 05/22/2017   Procedure: BYPASS GRAFT LEFT COMMON  FEMORAL-ABOVE THE KNEE POPLITEAL ARTERY USING 6 x 40 GORE PROPATEN VASCULAR GRAFT;  Surgeon: Conrad Milan, MD;  Location: [facility];  Service: Vascular;  Laterality: Left;  . HERNIA REPAIR  X 3   incisional/ventral hernia repair "in my stomach where the womb is"  . LAPAROSCOPIC CHOLECYSTECTOMY    . OOPHORECTOMY  2006   cervical cancer  . RECTOVAGINAL FISTULA CLOSURE  2012  . SQUAMOUS CELL CARCINOMA EXCISION    . TUBAL LIGATION    . VULVA SURGERY  2006   Cancer in situ  . WRIST SURGERY Right X 1   "accidentially cut it"  . WRIST SURGERY Left    "don't remember why"    Family History  Problem Relation Age of Onset  . COPD Father   . Cancer Father        lung  . COPD Mother   . Asthma Mother   . Cancer Paternal Aunt        lung    Allergies  Allergen Reactions  . Contrast Media [Iodinated Diagnostic Agents] Nausea And Vomiting  . Doxycycline Nausea And Vomiting  . Erythromycin Nausea And Vomiting  . Penicillins Itching and Rash    Has patient had a PCN reaction causing immediate rash, facial/tongue/throat swelling, SOB or lightheadedness with hypotension: YES Has patient had a PCN reaction causing severe rash involving mucus membranes or skin necrosis: NO Has patient had a PCN reaction that required hospitalization: NO Has patient had a PCN reaction occurring within the last 10 years: Unknown If all of the above answers are "NO", then may proceed with Cephalosporin use.   Marland Kitchen Amoxicillin Nausea And Vomiting and Swelling      Has patient had a PCN reaction causing immediate rash, facial/tongue/throat swelling, SOB or lightheadedness with hypotension: No Has patient had a PCN reaction causing severe rash involving mucus membranes or skin necrosis: No Has patient had a PCN reaction that required hospitalization: No Has patient had a PCN reaction occurring within the last 10 years: Yes If all of the above answers are "NO", then may proceed with C  . Metronidazole Hives and Rash    Current Outpatient Medications on File Prior to Visit  Medication Sig Dispense Refill  . alprazolam (XANAX) 2 MG tablet Take 0.5 tablets (1 mg total) by mouth at bedtime as needed for sleep or anxiety (Can take up to 3 thimes daily prn). Patient takes 1/2 tablet by mouth four times a day (Patient taking differently: Take 1-2 mg by mouth See admin instructions. 1 mg by mouth in the morning then 1 mg at noon(time) then 1 mg at 4 PM then 2 mg at bedtime) 30 tablet 0  . gabapentin (NEURONTIN) 100 MG capsule Take 100 mg by mouth 3 (three) times daily.    Marland Kitchen HYDROcodone-acetaminophen (NORCO) 10-325 MG tablet Take 1-2 tablets by mouth every 4 (four) hours as needed for moderate pain. 30 tablet 0  . ibuprofen (ADVIL,MOTRIN) 800 MG tablet Take 800 mg by mouth 3 (three) times daily as needed.  1  . metFORMIN (GLUCOPHAGE) 1000 MG tablet Take 1,000 mg by mouth 2 (two) times daily with a meal.    . OXYGEN Inhale 2 L into the lungs at bedtime.     . [DISCONTINUED] clomiPRAMINE (ANAFRANIL) 25 MG capsule Take 25 mg by mouth at bedtime.      No current facility-administered medications on file prior to visit.     BP (!) 190/100   Pulse 68   Temp 98 F (36.7 C) (Oral)   Wt 124 lb 4 oz (56.4 kg)   SpO2 96%   BMI 20.68 kg/m  Objective:   Physical Exam  Constitutional: She is oriented to person, place, and time. She appears well-nourished.  Poor historian  Neck: Neck supple.  Cardiovascular: Normal rate, regular rhythm and normal heart  sounds.  Respiratory: Effort normal.  Rales throughout  GI: Soft.  Musculoskeletal:  Ambulatory in clinic slowly with walker  Neurological: She is alert and oriented to person, place, and time.  Skin: Skin is warm and dry.  Several abrasions to nose. Open, non infectious wound to lower abdomen  Psychiatric: She has a normal mood and affect.           Assessment & Plan:

## 2017-09-13 NOTE — Assessment & Plan Note (Signed)
Was following with vascular. S/P bilateral common femoral artery cannulation, placement of catheter in aora, aortogram, left leg runoff via left femoral sheath.   Based off of pre and post operative notes the severity of her lower extremity pain doesn't correlate with her chronic left lower extremity pain.

## 2017-09-13 NOTE — Assessment & Plan Note (Signed)
Unsure if she's still following with pulmonology. She is no longer on Spiriva, Symbicort, or albuterol. Based off of hospital records she was on oxygen 3L per nasal cannula at home. She did not provide any of this information today.  Will discuss at next visit.

## 2017-09-13 NOTE — Assessment & Plan Note (Signed)
Left lower extremity pain could be secondary to chronic back pain. MRI reviewed from July 2019. Referral placed to pain management for further evaluation.   Discussed that I will not be prescribing Norco.

## 2017-09-13 NOTE — Assessment & Plan Note (Signed)
Once on atorvastatin, endorsed she stopped taking one year ago. Check lipids next visit. Will resume next visit.

## 2017-09-13 NOTE — Assessment & Plan Note (Signed)
S/p bypass graft to left common femoral artery in April 2019. Based off of the notes she was told that this operation may not alleviate her chronic pain.   She has not followed up with vascular surgery regarding symptoms. No longer on atorvastatin as she just "stopped taking".   Will attempt to reinitiate atorvastatin during next visit.

## 2017-09-13 NOTE — Assessment & Plan Note (Signed)
Noted during admission in March 2019 after accidental overdose of medications. She was prescribed metoprolol succinate 100 mg from which she was taking previously, also apixiban 5 mg BID.   Based off of patient and granddaughter (both poor historians) she is no longer taking either medication. She has no recollection of every having atrial fibrillation and denies palpitations, dizziness, tachycardia.   Normal rate and rhythm today. All of this information was found in her chart after patient's visit today.

## 2017-09-13 NOTE — Assessment & Plan Note (Signed)
Endorses compliance to Metformin. Based off of chart she was once on Glipizide as well. A1C in April of 6.1, not sure if she was taking both medications at that time.  We will repeat A1C at next visit.

## 2017-09-13 NOTE — Patient Instructions (Addendum)
Start losartan-hydrochlorothiazide 50-12.5 mg tablets for high blood pressure. Take 1 tablet by mouth once daily.   Start monitoring your blood pressure daily, around the same time of day, for the next 2 weeks.  Ensure that you have rested for 30 minutes prior to checking your blood pressure. Record your readings and bring them to your next visit.  You will be contacted regarding your referral to pain management for your back.  Please let us know if you have not been contacted within one week.   Please call me to verify all of your current medications. It's important that we have these on file.   Schedule a follow up visit in 2 weeks for re-evaluation.   It was a pleasure to meet you today! Please don't hesitate to call or message me with any questions. Welcome to [facility]!

## 2017-09-13 NOTE — Assessment & Plan Note (Signed)
Based off of records she was once on metoprolol succinate 100 mg and HCTZ 25 mg. She did not give this information today.  BP very high in the office today, do not suspect this is only from chronic pain. RX for losartan-HCTZ 50-12.5 mg was provided today.   She was asked to schedule a follow up visit in 2 weeks for BP check and BMP.

## 2017-09-14 ENCOUNTER — Ambulatory Visit: Payer: Self-pay

## 2017-09-14 ENCOUNTER — Telehealth: Payer: Self-pay | Admitting: Primary Care

## 2017-09-14 NOTE — Telephone Encounter (Signed)
On chart review it looks like she often runs very high.  If she refuses to go to ED - then please continue to watch bp -I hope it comes down with the medication given Schedule f/u with PCP next week

## 2017-09-14 NOTE — Telephone Encounter (Addendum)
Left detailed message on voicemail of patient.  VM of [name] with [facility] was full and accepting no messages.

## 2017-09-14 NOTE — Telephone Encounter (Signed)
Incoming call from [place] of [facility].  [phone] States patients Blood Pressure is 218/112 resp. 22 heart rate  108  appically 93% sat.   Second reading was 210/120 heart rate 106 resp 22 Blood Pressure was obtained manually both times administered [place] hctz approximately 3:15pm   Ate a [place] sandwich for lunch.  Able to grip with both hands.  Denies slurred speech, headache, dizziness, tingles, no facial drooping. Denies, chest pain. Positive for productive cough.  Per protocol recommended patient go to ER.  Related to visiting nurse Granddaughter, AND PATIENT.  PATIENT refused. Granddaughter  Stated she knew she wouldn't go. Provided care advice to patient.   and granddaughter. They both voiced understanding.       Reason for Disposition . [2] Systolic BP  >= 297 OR Diastolic >= 989 AND [2] cardiac or neurologic symptoms (e.g., chest pain, difficulty breathing, unsteady gait, blurred vision)  Answer Assessment - Initial Assessment Questions 1. BLOOD PRESSURE: "What is the blood pressure?" "Did you take at least two measurements 5 minutes apart?"  218/112 then  resp 22 hear rate 108 93% 22 respiration 22.  210/120 106 22 respirations heart rate 106 apically  T     Greater than 5 minutes apart. 2. ONSET: "When did you take your blood pressure?" 3; 30pm   3. HOW: "How did you obtain the blood pressure?" (e.g., visiting nurse, automatic home BP monitor)     Manually 4. HISTORY: "Do you have a history of high blood pressure?" yes      \MEDICATIONS: "Are you taking any medications for blood pressure?" "Have you missed any doses recently?" just started  Losartan      Yes 6. OTHER SYMPTOMS: "Do you have any symptoms?" (e.g., headache, chest pain, blurred vision, difficulty breathing, weakness)    7. PREGNANCY: "Is there any chance you are pregnant?" "When was your last menstrual period?"  Protocols used: HIGH BLOOD PRESSURE-A-AH

## 2017-09-14 NOTE — Telephone Encounter (Signed)
Copied from [place] [id number]. Topic: Quick Communication - See Telephone Encounter >> Sep 14, 2017  3:27 PM Neva Seat wrote: Dorthea Cove, RN w/ [facility] [phone]  Skilled nursing for pt: Wound care orders for hernia that opened up and is draining.  Needing a referral for Orthopedic specialist for pinched nerve in left leg.  BP reading 218/112 - pt was sent NT

## 2017-09-14 NOTE — Telephone Encounter (Signed)
[facility] for skilled nursing as requested for wound care. PCP put in referral for pain management. Will defer to PCP wether she wants pt to see ortho vs neurosurgery.

## 2017-09-14 NOTE — Telephone Encounter (Signed)
VO given to Kendrice  She states pt will most likely need Alginate/AMD dressing w/ Silvadene

## 2017-09-24 ENCOUNTER — Ambulatory Visit: Payer: Medicare Other | Admitting: Primary Care

## 2017-09-24 DIAGNOSIS — Z0289 Encounter for other administrative examinations: Secondary | ICD-10-CM

## 2017-09-28 ENCOUNTER — Ambulatory Visit: Payer: Medicare Other | Admitting: Primary Care

## 2017-12-10 ENCOUNTER — Encounter (HOSPITAL_COMMUNITY): Payer: Self-pay

## 2017-12-10 ENCOUNTER — Other Ambulatory Visit: Payer: Self-pay

## 2017-12-10 ENCOUNTER — Emergency Department (HOSPITAL_COMMUNITY): Payer: Medicare Other

## 2017-12-10 ENCOUNTER — Inpatient Hospital Stay (HOSPITAL_COMMUNITY)
Admission: EM | Admit: 2017-12-10 | Discharge: 2017-12-14 | DRG: 193 | Disposition: A | Discharge status: Home Health | Payer: Medicare Other | Attending: Internal Medicine | Admitting: Internal Medicine

## 2017-12-10 DIAGNOSIS — Z9981 Dependence on supplemental oxygen: Secondary | ICD-10-CM

## 2017-12-10 DIAGNOSIS — I10 Essential (primary) hypertension: Secondary | ICD-10-CM | POA: Diagnosis not present

## 2017-12-10 DIAGNOSIS — E1165 Type 2 diabetes mellitus with hyperglycemia: Secondary | ICD-10-CM | POA: Diagnosis present

## 2017-12-10 DIAGNOSIS — E876 Hypokalemia: Secondary | ICD-10-CM | POA: Diagnosis present

## 2017-12-10 DIAGNOSIS — Z888 Allergy status to other drugs, medicaments and biological substances status: Secondary | ICD-10-CM

## 2017-12-10 DIAGNOSIS — J188 Other pneumonia, unspecified organism: Secondary | ICD-10-CM

## 2017-12-10 DIAGNOSIS — F329 Major depressive disorder, single episode, unspecified: Secondary | ICD-10-CM | POA: Diagnosis present

## 2017-12-10 DIAGNOSIS — J9621 Acute and chronic respiratory failure with hypoxia: Secondary | ICD-10-CM

## 2017-12-10 DIAGNOSIS — I480 Paroxysmal atrial fibrillation: Secondary | ICD-10-CM | POA: Diagnosis present

## 2017-12-10 DIAGNOSIS — F17210 Nicotine dependence, cigarettes, uncomplicated: Secondary | ICD-10-CM | POA: Diagnosis present

## 2017-12-10 DIAGNOSIS — E1159 Type 2 diabetes mellitus with other circulatory complications: Secondary | ICD-10-CM | POA: Diagnosis not present

## 2017-12-10 DIAGNOSIS — E1151 Type 2 diabetes mellitus with diabetic peripheral angiopathy without gangrene: Secondary | ICD-10-CM | POA: Diagnosis present

## 2017-12-10 DIAGNOSIS — J449 Chronic obstructive pulmonary disease, unspecified: Secondary | ICD-10-CM

## 2017-12-10 DIAGNOSIS — Z716 Tobacco abuse counseling: Secondary | ICD-10-CM

## 2017-12-10 DIAGNOSIS — I110 Hypertensive heart disease with heart failure: Secondary | ICD-10-CM | POA: Diagnosis present

## 2017-12-10 DIAGNOSIS — Z91041 Radiographic dye allergy status: Secondary | ICD-10-CM

## 2017-12-10 DIAGNOSIS — F909 Attention-deficit hyperactivity disorder, unspecified type: Secondary | ICD-10-CM | POA: Diagnosis present

## 2017-12-10 DIAGNOSIS — R471 Dysarthria and anarthria: Secondary | ICD-10-CM | POA: Diagnosis present

## 2017-12-10 DIAGNOSIS — E43 Unspecified severe protein-calorie malnutrition: Secondary | ICD-10-CM | POA: Diagnosis present

## 2017-12-10 DIAGNOSIS — Z681 Body mass index (BMI) 19 or less, adult: Secondary | ICD-10-CM

## 2017-12-10 DIAGNOSIS — F419 Anxiety disorder, unspecified: Secondary | ICD-10-CM | POA: Diagnosis present

## 2017-12-10 DIAGNOSIS — Z90722 Acquired absence of ovaries, bilateral: Secondary | ICD-10-CM

## 2017-12-10 DIAGNOSIS — J440 Chronic obstructive pulmonary disease with acute lower respiratory infection: Secondary | ICD-10-CM | POA: Diagnosis present

## 2017-12-10 DIAGNOSIS — Z79899 Other long term (current) drug therapy: Secondary | ICD-10-CM

## 2017-12-10 DIAGNOSIS — Z981 Arthrodesis status: Secondary | ICD-10-CM

## 2017-12-10 DIAGNOSIS — R739 Hyperglycemia, unspecified: Secondary | ICD-10-CM

## 2017-12-10 DIAGNOSIS — Z880 Allergy status to penicillin: Secondary | ICD-10-CM

## 2017-12-10 DIAGNOSIS — K219 Gastro-esophageal reflux disease without esophagitis: Secondary | ICD-10-CM | POA: Diagnosis present

## 2017-12-10 DIAGNOSIS — I5032 Chronic diastolic (congestive) heart failure: Secondary | ICD-10-CM | POA: Diagnosis present

## 2017-12-10 DIAGNOSIS — Z809 Family history of malignant neoplasm, unspecified: Secondary | ICD-10-CM

## 2017-12-10 DIAGNOSIS — Z825 Family history of asthma and other chronic lower respiratory diseases: Secondary | ICD-10-CM

## 2017-12-10 DIAGNOSIS — G894 Chronic pain syndrome: Secondary | ICD-10-CM | POA: Diagnosis present

## 2017-12-10 DIAGNOSIS — Z90710 Acquired absence of both cervix and uterus: Secondary | ICD-10-CM

## 2017-12-10 DIAGNOSIS — E785 Hyperlipidemia, unspecified: Secondary | ICD-10-CM | POA: Diagnosis present

## 2017-12-10 DIAGNOSIS — Z85828 Personal history of other malignant neoplasm of skin: Secondary | ICD-10-CM

## 2017-12-10 DIAGNOSIS — Z713 Dietary counseling and surveillance: Secondary | ICD-10-CM

## 2017-12-10 DIAGNOSIS — Z881 Allergy status to other antibiotic agents status: Secondary | ICD-10-CM

## 2017-12-10 DIAGNOSIS — Z8541 Personal history of malignant neoplasm of cervix uteri: Secondary | ICD-10-CM

## 2017-12-10 DIAGNOSIS — J189 Pneumonia, unspecified organism: Secondary | ICD-10-CM | POA: Diagnosis not present

## 2017-12-10 DIAGNOSIS — Z9049 Acquired absence of other specified parts of digestive tract: Secondary | ICD-10-CM

## 2017-12-10 DIAGNOSIS — Z7984 Long term (current) use of oral hypoglycemic drugs: Secondary | ICD-10-CM

## 2017-12-10 DIAGNOSIS — Z9119 Patient's noncompliance with other medical treatment and regimen: Secondary | ICD-10-CM

## 2017-12-10 LAB — CBC WITH DIFFERENTIAL/PLATELET
ABS IMMATURE GRANULOCYTES: 0.09 10*3/uL — AB (ref 0.00–0.07)
Basophils Absolute: 0.1 10*3/uL (ref 0.0–0.1)
Basophils Relative: 1 %
EOS ABS: 0.1 10*3/uL (ref 0.0–0.5)
Eosinophils Relative: 1 %
HEMATOCRIT: 39.9 % (ref 36.0–46.0)
Hemoglobin: 12.3 g/dL (ref 12.0–15.0)
Immature Granulocytes: 1 %
LYMPHS ABS: 2 10*3/uL (ref 0.7–4.0)
Lymphocytes Relative: 13 %
MCH: 27.5 pg (ref 26.0–34.0)
MCHC: 30.8 g/dL (ref 30.0–36.0)
MCV: 89.3 fL (ref 80.0–100.0)
MONO ABS: 0.8 10*3/uL (ref 0.1–1.0)
MONOS PCT: 5 %
Neutro Abs: 12.1 10*3/uL — ABNORMAL HIGH (ref 1.7–7.7)
Neutrophils Relative %: 79 %
PLATELETS: 389 10*3/uL (ref 150–400)
RBC: 4.47 MIL/uL (ref 3.87–5.11)
RDW: 13 % (ref 11.5–15.5)
WBC: 15.2 10*3/uL — ABNORMAL HIGH (ref 4.0–10.5)
nRBC: 0 % (ref 0.0–0.2)

## 2017-12-10 LAB — BASIC METABOLIC PANEL
Anion gap: 11 (ref 5–15)
BUN: 40 mg/dL — AB (ref 8–23)
CO2: 28 mmol/L (ref 22–32)
Calcium: 8.9 mg/dL (ref 8.9–10.3)
Chloride: 99 mmol/L (ref 98–111)
Creatinine, Ser: 0.99 mg/dL (ref 0.44–1.00)
GFR calc Af Amer: >60 mL/min (ref >60)
GFR, EST NON AFRICAN AMERICAN: 59 mL/min — AB (ref >60)
GLUCOSE: 279 mg/dL — AB (ref 70–99)
POTASSIUM: 3.2 mmol/L — AB (ref 3.5–5.1)
Sodium: 138 mmol/L (ref 135–145)

## 2017-12-10 LAB — CBG MONITORING, ED: Glucose-Capillary: 203 mg/dL — ABNORMAL HIGH (ref 70–99)

## 2017-12-10 LAB — I-STAT TROPONIN, ED: Troponin i, poc: 0.02 ng/mL (ref 0.00–0.08)

## 2017-12-10 LAB — I-STAT CG4 LACTIC ACID, ED: Lactic Acid, Venous: 1.1 mmol/L (ref 0.5–1.9)

## 2017-12-10 MED ORDER — HYDROCODONE-ACETAMINOPHEN 10-325 MG PO TABS
1.0000 | ORAL_TABLET | ORAL | Status: DC | PRN
  Administered 2017-12-12: 1 via ORAL
  Filled 2017-12-10: qty 1

## 2017-12-10 MED ORDER — LEVALBUTEROL HCL 0.63 MG/3ML IN NEBU: 0.6300 mg | INHALATION_SOLUTION | Freq: Four times a day (QID) | RESPIRATORY_TRACT | Status: DC | PRN

## 2017-12-10 MED ORDER — LOSARTAN POTASSIUM 50 MG PO TABS
50.0000 mg | ORAL_TABLET | Freq: Every day | ORAL | Status: DC
  Administered 2017-12-11 – 2017-12-14 (×4): 50 mg via ORAL
  Filled 2017-12-10 (×4): qty 1

## 2017-12-10 MED ORDER — ALPRAZOLAM 0.5 MG PO TABS
2.0000 mg | ORAL_TABLET | Freq: Every day | ORAL | Status: DC
  Administered 2017-12-11 – 2017-12-12 (×3): 2 mg via ORAL
  Filled 2017-12-10 (×3): qty 4

## 2017-12-10 MED ORDER — INSULIN ASPART 100 UNIT/ML ~~LOC~~ SOLN
0.0000 [IU] | Freq: Three times a day (TID) | SUBCUTANEOUS | Status: DC
  Administered 2017-12-11: 2 [IU] via SUBCUTANEOUS
  Administered 2017-12-12: 3 [IU] via SUBCUTANEOUS
  Administered 2017-12-13 – 2017-12-14 (×2): 2 [IU] via SUBCUTANEOUS
  Filled 2017-12-10: qty 1

## 2017-12-10 MED ORDER — LEVOFLOXACIN IN D5W 750 MG/150ML IV SOLN
750.0000 mg | Freq: Once | INTRAVENOUS | Status: AC
  Administered 2017-12-10: 750 mg via INTRAVENOUS
  Filled 2017-12-10: qty 150

## 2017-12-10 MED ORDER — ONDANSETRON HCL 4 MG PO TABS: 4.0000 mg | ORAL_TABLET | Freq: Four times a day (QID) | ORAL | Status: DC | PRN

## 2017-12-10 MED ORDER — GABAPENTIN 100 MG PO CAPS
100.0000 mg | ORAL_CAPSULE | Freq: Three times a day (TID) | ORAL | Status: DC
  Administered 2017-12-11 – 2017-12-14 (×11): 100 mg via ORAL
  Filled 2017-12-10 (×11): qty 1

## 2017-12-10 MED ORDER — ENOXAPARIN SODIUM 40 MG/0.4ML ~~LOC~~ SOLN
40.0000 mg | Freq: Every day | SUBCUTANEOUS | Status: DC
  Administered 2017-12-11 – 2017-12-14 (×4): 40 mg via SUBCUTANEOUS
  Filled 2017-12-10 (×4): qty 0.4

## 2017-12-10 MED ORDER — ALPRAZOLAM 0.25 MG PO TABS: 1.0000 mg | ORAL_TABLET | ORAL | Status: DC

## 2017-12-10 MED ORDER — ACETAMINOPHEN 325 MG PO TABS: 650.0000 mg | ORAL_TABLET | Freq: Four times a day (QID) | ORAL | Status: DC | PRN

## 2017-12-10 MED ORDER — HYDROCHLOROTHIAZIDE 12.5 MG PO CAPS
12.5000 mg | ORAL_CAPSULE | Freq: Every day | ORAL | Status: DC
  Administered 2017-12-11: 12.5 mg via ORAL
  Filled 2017-12-10: qty 1

## 2017-12-10 MED ORDER — ALPRAZOLAM 0.5 MG PO TABS
1.0000 mg | ORAL_TABLET | Freq: Three times a day (TID) | ORAL | Status: DC
  Administered 2017-12-11 – 2017-12-12 (×5): 1 mg via ORAL
  Filled 2017-12-10: qty 4
  Filled 2017-12-10 (×4): qty 2

## 2017-12-10 MED ORDER — LOSARTAN POTASSIUM-HCTZ 50-12.5 MG PO TABS: 1.0000 | ORAL_TABLET | Freq: Every day | ORAL | Status: DC

## 2017-12-10 MED ORDER — ONDANSETRON HCL 4 MG/2ML IJ SOLN: 4.0000 mg | Freq: Four times a day (QID) | INTRAMUSCULAR | Status: DC | PRN

## 2017-12-10 MED ORDER — LEVOFLOXACIN IN D5W 750 MG/150ML IV SOLN
750.0000 mg | INTRAVENOUS | Status: DC
  Administered 2017-12-11 – 2017-12-13 (×3): 750 mg via INTRAVENOUS
  Filled 2017-12-10 (×4): qty 150

## 2017-12-10 MED ORDER — ACETAMINOPHEN 650 MG RE SUPP: 650.0000 mg | Freq: Four times a day (QID) | RECTAL | Status: DC | PRN

## 2017-12-10 NOTE — H&P (Signed)
History and Physical    [name] EHM:[id number] DOB: 05-07-1955 DOA: 12/10/2017  PCP: Elwyn Reach, MD  Patient coming from: Home.  Chief Complaint: Shortness of breath.  HPI: [name] is a 62 y.o. female with history of COPD with ongoing tobacco abuse, anxiety, hypertension presents to the ER with complaints of worsening shortness of breath with productive cough for the last 3 days.  Has been having greenish sputum production on coughing.  Denies chest pain or any recent travel or sick contacts.  ED Course: In the ER patient remained hypoxic and chest x-ray showed infiltrates concerning for pneumonia.  Given that patient remained persistently hypoxic patient admitted for further management of pneumonia.  Review of Systems: As per HPI, rest all negative.   Past Medical History:  Diagnosis Date  . ADD (attention deficit disorder with hyperactivity)   . Anxiety   . Arthritis    "neck" (06/16/2015)  . Asthma   . Basal cell carcinoma, face   . Cervical cancer ([place])   . Chronic bronchitis ([place])   . 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 COPD (chronic obstructive pulmonary disease) ([place])   . Depression   . GERD (gastroesophageal reflux disease)   . Hypertension   . Hypokalemia   . On home oxygen therapy    "2L at bedtime" (06/16/2015)  . PAF (paroxysmal atrial fibrillation) ([place])    04/18/17 in the setting of bronchodilators and intubation for hypercarbic respiratory failure  . Squamous cell carcinoma, face   . Thyroid cyst   . Type II diabetes mellitus ([place])     Past Surgical History:  Procedure Laterality Date  . ABDOMINAL AORTOGRAM N/A 04/05/2017   Procedure: ABDOMINAL AORTOGRAM;  Surgeon: Conrad Saxapahaw, MD;  Location: [facility];  Service: Vascular;  Laterality: N/A;  . ABDOMINAL HYSTERECTOMY    . ANTERIOR CERVICAL DECOMP/DISCECTOMY FUSION    . AORTOGRAM Left 04/05/2017   Procedure: Angiogram with left leg runoff;  Surgeon: Conrad [place]re, MD;  Location: [facility] [place];   Service: Vascular;  Laterality: Left;  . BACK SURGERY    . BASAL CELL CARCINOMA EXCISION     "face"  . COLECTOMY    . EXCISIONAL HEMORRHOIDECTOMY    . FEMORAL-POPLITEAL BYPASS GRAFT Left 05/22/2017   BYPASS GRAFT LEFT COMMON  FEMORAL-ABOVE THE KNEE POPLITEAL ARTERY USING 6 x 40 GORE PROPATEN VASCULAR GRAFT  . FEMORAL-POPLITEAL BYPASS GRAFT Left 05/22/2017   Procedure: BYPASS GRAFT LEFT COMMON  FEMORAL-ABOVE THE KNEE POPLITEAL ARTERY USING 6 x 40 GORE PROPATEN VASCULAR GRAFT;  Surgeon: Conrad Eudora, MD;  Location: [facility];  Service: Vascular;  Laterality: Left;  . HERNIA REPAIR  X 3   incisional/ventral hernia repair "in my stomach where the womb is"  . LAPAROSCOPIC CHOLECYSTECTOMY    . OOPHORECTOMY  2006   cervical cancer  . RECTOVAGINAL FISTULA CLOSURE  2012  . SQUAMOUS CELL CARCINOMA EXCISION    . TUBAL LIGATION    . VULVA SURGERY  2006   Cancer in situ  . WRIST SURGERY Right X 1   "accidentially cut it"  . WRIST SURGERY Left    "don't remember why"     reports that she has been smoking cigarettes. She has a 47.00 pack-year smoking history. She has never used smokeless tobacco. She reports that she does not drink alcohol or use drugs.  Allergies  Allergen Reactions  . Contrast Media [Iodinated Diagnostic Agents] Nausea And Vomiting  . Doxycycline Nausea And Vomiting  .  Erythromycin Nausea And Vomiting  . Penicillins Itching and Rash    Has patient had a PCN reaction causing immediate rash, facial/tongue/throat swelling, SOB or lightheadedness with hypotension: YES Has patient had a PCN reaction causing severe rash involving mucus membranes or skin necrosis: NO Has patient had a PCN reaction that required hospitalization: NO Has patient had a PCN reaction occurring within the last 10 years: Unknown If all of the above answers are "NO", then may proceed with Cephalosporin use.   Marland Kitchen Amoxicillin Nausea And Vomiting and Swelling     Has patient had a PCN reaction causing immediate  rash, facial/tongue/throat swelling, SOB or lightheadedness with hypotension: No Has patient had a PCN reaction causing severe rash involving mucus membranes or skin necrosis: No Has patient had a PCN reaction that required hospitalization: No Has patient had a PCN reaction occurring within the last 10 years: Yes If all of the above answers are "NO", then may proceed with C  . Metronidazole Hives and Rash    Family History  Problem Relation Age of Onset  . COPD Father   . Cancer Father        lung  . COPD Mother   . Asthma Mother   . Cancer Paternal Aunt        lung    Prior to Admission medications   Medication Sig Start Date End Date Taking? Authorizing Provider  alprazolam Duanne Moron) 2 MG tablet Take 0.5 tablets (1 mg total) by mouth at bedtime as needed for sleep or anxiety (Can take up to 3 thimes daily prn). Patient takes 1/2 tablet by mouth four times a day Patient taking differently: Take 1-2 mg by mouth See admin instructions. 1 mg by mouth in the morning then 1 mg at noon(time) then 1 mg at 4 PM then 2 mg at bedtime 01/05/14   Theodis Blaze, MD  gabapentin (NEURONTIN) 100 MG capsule Take 100 mg by mouth 3 (three) times daily.    [provider]  HYDROcodone-acetaminophen (NORCO) 10-325 MG tablet Take 1-2 tablets by mouth every 4 (four) hours as needed for moderate pain. 05/26/17   Ulyses Amor, PA-C  ibuprofen (ADVIL,MOTRIN) 800 MG tablet Take 800 mg by mouth 3 (three) times daily as needed. 08/15/17   [provider]  losartan-hydrochlorothiazide (HYZAAR) 50-12.5 MG tablet Take 1 tablet by mouth once daily for blood pressure. 09/13/17   Pleas Koch, NP  metFORMIN (GLUCOPHAGE) 1000 MG tablet Take 1,000 mg by mouth 2 (two) times daily with a meal.    [provider]  OXYGEN Inhale 2 L into the lungs at bedtime.     [provider]  clomiPRAMINE (ANAFRANIL) 25 MG capsule Take 25 mg by mouth at bedtime.   05/08/11  [provider]     Physical Exam: Vitals:   12/10/17 2130 12/10/17 2145 12/10/17 2300 12/10/17 2315  BP: 97/74 104/79 115/73 114/78  Pulse: 93 94 73 72  Resp: 15 (!) 32 14 (!) 30  SpO2: 95% 94% 94% 95%  Weight:      Height:          Constitutional: Moderately built and nourished. Vitals:   12/10/17 2130 12/10/17 2145 12/10/17 2300 12/10/17 2315  BP: 97/74 104/79 115/73 114/78  Pulse: 93 94 73 72  Resp: 15 (!) 32 14 (!) 30  SpO2: 95% 94% 94% 95%  Weight:      Height:       Eyes: Anicteric no pallor. ENMT: No discharge  from the ears eyes nose or mouth. Neck: No mass felt.  No neck rigidity.  No JVD appreciated. Respiratory: No rhonchi or crepitations. Cardiovascular: S1-S2 heard no murmurs appreciated. Abdomen: Soft nontender bowel sounds present. Musculoskeletal: No edema.  No joint effusion. Skin: No rash. Neurologic: Alert awake oriented to time place and person.  Moves all extremities. Psychiatric: Appears normal per normal affect.   Labs on Admission: I have personally reviewed following labs and imaging studies  CBC: Recent Labs  Lab 12/10/17 2017  WBC 15.2*  NEUTROABS 12.1*  HGB 12.3  HCT 39.9  MCV 89.3  PLT 563   Basic Metabolic Panel: Recent Labs  Lab 12/10/17 2017  NA 138  K 3.2*  CL 99  CO2 28  GLUCOSE 279*  BUN 40*  CREATININE 0.99  CALCIUM 8.9   GFR: Estimated Creatinine Clearance: 46.2 mL/min (by C-G formula based on SCr of 0.99 mg/dL). Liver Function Tests: No results for input(s): AST, ALT, ALKPHOS, BILITOT, PROT, ALBUMIN in the last 168 hours. No results for input(s): LIPASE, AMYLASE in the last 168 hours. No results for input(s): AMMONIA in the last 168 hours. Coagulation Profile: No results for input(s): INR, PROTIME in the last 168 hours. Cardiac Enzymes: No results for input(s): CKTOTAL, CKMB, CKMBINDEX, TROPONINI in the last 168 hours. BNP (last 3 results) No results for input(s): PROBNP in the last 8760 hours. HbA1C: No results for  input(s): HGBA1C in the last 72 hours. CBG: Recent Labs  Lab 12/10/17 2301  GLUCAP 203*   Lipid Profile: No results for input(s): CHOL, HDL, LDLCALC, TRIG, CHOLHDL, LDLDIRECT in the last 72 hours. Thyroid Function Tests: No results for input(s): TSH, T4TOTAL, FREET4, T3FREE, THYROIDAB in the last 72 hours. Anemia Panel: No results for input(s): VITAMINB12, FOLATE, FERRITIN, TIBC, IRON, RETICCTPCT in the last 72 hours. Urine analysis:    Component Value Date/Time   COLORURINE YELLOW 05/16/2017 2154   APPEARANCEUR CLEAR 05/16/2017 2154   LABSPEC 1.017 05/16/2017 2154   PHURINE 6.0 05/16/2017 2154   GLUCOSEU NEGATIVE 05/16/2017 2154   HGBUR NEGATIVE 05/16/2017 2154   [place] NEGATIVE 05/16/2017 2154   [place] NEGATIVE 05/16/2017 2154   PROTEINUR NEGATIVE 05/16/2017 2154   UROBILINOGEN 0.2 01/03/2014 1819   NITRITE NEGATIVE 05/16/2017 2154   LEUKOCYTESUR NEGATIVE 05/16/2017 2154   Sepsis Labs: @LABRCNTIP (procalcitonin:4,lacticidven:4) )No results found for this or any previous visit (from the past 240 hour(s)).   Radiological Exams on Admission: Dg Chest 2 View  Result Date: 12/10/2017 CLINICAL DATA:  Weakness 3 days.  Altered mental status. EXAM: CHEST - 2 VIEW COMPARISON:  05/24/2017 FINDINGS: Patient is slightly rotated to the right. Lungs are adequately inflated demonstrate airspace process over the right middle and lower lobe likely pneumonia. No significant effusion. Left lung is clear. Cardiomediastinal silhouette and remainder of the exam is unchanged. IMPRESSION: Patchy airspace process over the right middle and lower lobe likely pneumonia. Electronically Signed   By: Marin Olp M.D.   On: 12/10/2017 21:05    EKG: Independently reviewed.  Normal sinus rhythm.  Assessment/Plan Principal Problem:   CAP (community acquired pneumonia) Active Problems:   COPD GOLD II/ still smoking    Chronic diastolic CHF (congestive heart failure) (HCC)   PAF (paroxysmal  atrial fibrillation) (HCC)   Type 2 diabetes mellitus with vascular disease ([place])   Essential hypertension    1. Community-acquired pneumonia -patient on empiric antibiotics.  Check urine for Legionella strep antigen sputum cultures blood cultures. 2. COPD not actively wheezing. 3. Hypertension  on ARB and hydrochlorothiazide. 4. History of paroxysmal atrial fibrillation noted during earlier admission.  Patient does not recall it.  Not on any anticoagulants or rate limiting medications.  Presently in sinus rhythm. 5. Diastolic CHF noted and 2D echo done in March 2019 with EF of 60 to 65% with grade 1 diastolic dysfunction.  At this time patient symptoms are more likely from CHF. 6. Diabetes mellitus type 2 per the chart not on any medication.  Follow metabolic panel. 7. Tobacco abuse -advised about quitting smoking.   DVT prophylaxis: Lovenox. Code Status: Full code. Family Communication: Discussed with patient. Disposition Plan: Home. Consults called: None. Admission status: Observation.   Rise Patience MD [facility] Pager [phone].  If 7PM-7AM, please contact night-coverage [email or web address] Password [facility]  12/10/2017, 11:28 PM

## 2017-12-10 NOTE — ED Notes (Signed)
Patient transported to X-ray 

## 2017-12-10 NOTE — ED Triage Notes (Signed)
Per [facility], pt from home with c/o weakness x3 days and a productive, wet cough. Hx PNA and COPD. CBG 283, BP 118/78, 88% on RA --> 99% on NRB, 20 ga IV in R forearm.

## 2017-12-10 NOTE — ED Provider Notes (Signed)
[facility] EMERGENCY DEPARTMENT Provider Note   CSN: [id number] Arrival date & time: 12/10/17  2016     History   Chief Complaint Chief Complaint  Patient presents with  . Weakness    HPI [name] is a 62 y.o. female.  Patient with history of COPD on oxygen at night, chronic pain on hydrocodone, grade I diastolic CHF --presents to the emergency department with worsening shortness of breath over the past 4 days.  She reports increasing cough productive of sputum.  EMS was called for transport.  Patient was at 88% on room air with improvement on nonrebreather.  Patient reports chronic back pain with radiation into her leg.  She reports that she had a previous surgery to clear up a blockage in her leg and has had chronic pain since the surgery.  She states that home health nurse told her that if she came to the emergency department we would give her pain medication.  Patient denies fever or chest pain.  She denies lower extremity swelling.  She has not used her oxygen during the day.  Symptoms are worse with exertion.     Past Medical History:  Diagnosis Date  . ADD (attention deficit disorder with hyperactivity)   . Anxiety   . Arthritis    "neck" (06/16/2015)  . Asthma   . Basal cell carcinoma, face   . Cervical cancer ([place])   . Chronic bronchitis ([place])   . 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 COPD (chronic obstructive pulmonary disease) ([place])   . Depression   . GERD (gastroesophageal reflux disease)   . Hypertension   . Hypokalemia   . On home oxygen therapy    "2L at bedtime" (06/16/2015)  . PAF (paroxysmal atrial fibrillation) ([place])    04/18/17 in the setting of bronchodilators and intubation for hypercarbic respiratory failure  . Squamous cell carcinoma, face   . Thyroid cyst   . Type II diabetes mellitus [facility])     Patient Active Problem List   Diagnosis Date Noted  . Chronic left-sided low back pain with left-sided sciatica 09/13/2017  . PAD  (peripheral artery disease) ([place]) 05/22/2017  . PAF (paroxysmal atrial fibrillation) ([place]) 05/15/2017  . Altered mental status 04/18/2017  . Acute encephalopathy 04/18/2017  . Respiratory failure ([place])   . 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 Chronic diastolic CHF (congestive heart failure) ([place]) 08/08/2016  . Protein-calorie malnutrition, moderate ([place]) 06/16/2015  . Head injury with loss of consciousness ([place]) 06/04/2015  . Accelerated hypertension 06/04/2015  . Syncope and collapse 06/04/2015  . Pyuria 06/04/2015  . Cigarette smoker 06/04/2015  . Diabetes mellitus without complication ([place]) 09/40/7680  . HLD (hyperlipidemia) 01/03/2014  . COPD exacerbation ([place]) 01/03/2014  . Depression   . Hypertension   . COPD GOLD II/ still smoking    . Asthma   . ADHD (attention deficit hyperactivity disorder)   . Chest pain 12/15/2013  . GERD (gastroesophageal reflux disease)   . Anxiety   . Post op infection 03/24/2011  . Incisional hernia 01/20/2011    Past Surgical History:  Procedure Laterality Date  . ABDOMINAL AORTOGRAM N/A 04/05/2017   Procedure: ABDOMINAL AORTOGRAM;  Surgeon: Conrad Palermo, MD;  Location: [facility];  Service: Vascular;  Laterality: N/A;  . ABDOMINAL HYSTERECTOMY    . ANTERIOR CERVICAL DECOMP/DISCECTOMY FUSION    . AORTOGRAM Left 04/05/2017   Procedure: Angiogram with left leg runoff;  Surgeon: Conrad Whitecone, MD;  [facility];  Service: Vascular;  Laterality: Left;  . BACK SURGERY    . BASAL CELL CARCINOMA EXCISION     "face"  . COLECTOMY    . EXCISIONAL HEMORRHOIDECTOMY    . FEMORAL-POPLITEAL BYPASS GRAFT Left 05/22/2017   BYPASS GRAFT LEFT COMMON  FEMORAL-ABOVE THE KNEE POPLITEAL ARTERY USING 6 x 40 GORE PROPATEN VASCULAR GRAFT  . FEMORAL-POPLITEAL BYPASS GRAFT Left 05/22/2017   Procedure: BYPASS GRAFT LEFT COMMON  FEMORAL-ABOVE THE KNEE POPLITEAL ARTERY USING 6 x 40 GORE PROPATEN VASCULAR GRAFT;  Surgeon:  Conrad [place] Jo[place]quin, MD;  Location: [facility];  Service: Vascular;  Laterality: Left;  . HERNIA REPAIR  X 3   incisional/ventral hernia repair "in my stomach where the womb is"  . LAPAROSCOPIC CHOLECYSTECTOMY    . OOPHORECTOMY  2006   cervical cancer  . RECTOVAGINAL FISTULA CLOSURE  2012  . SQUAMOUS CELL CARCINOMA EXCISION    . TUBAL LIGATION    . VULVA SURGERY  2006   Cancer in situ  . WRIST SURGERY Right X 1   "accidentially cut it"  . WRIST SURGERY Left    "don't remember why"     OB History   None      Home Medications    Prior to Admission medications   Medication Sig Start Date End Date Taking? Authorizing Provider  alprazolam Duanne Moron) 2 MG tablet Take 0.5 tablets (1 mg total) by mouth at bedtime as needed for sleep or anxiety (Can take up to 3 thimes daily prn). Patient takes 1/2 tablet by mouth four times a day Patient taking differently: Take 1-2 mg by mouth See admin instructions. 1 mg by mouth in the morning then 1 mg at noon(time) then 1 mg at 4 PM then 2 mg at bedtime 01/05/14   Theodis Blaze, MD  gabapentin (NEURONTIN) 100 MG capsule Take 100 mg by mouth 3 (three) times daily.    [provider]  HYDROcodone-acetaminophen (NORCO) 10-325 MG tablet Take 1-2 tablets by mouth every 4 (four) hours as needed for moderate pain. 05/26/17   Ulyses Amor, PA-C  ibuprofen (ADVIL,MOTRIN) 800 MG tablet Take 800 mg by mouth 3 (three) times daily as needed. 08/15/17   [provider]  losartan-hydrochlorothiazide (HYZAAR) 50-12.5 MG tablet Take 1 tablet by mouth once daily for blood pressure. 09/13/17   Pleas Koch, NP  metFORMIN (GLUCOPHAGE) 1000 MG tablet Take 1,000 mg by mouth 2 (two) times daily with a meal.    [provider]  OXYGEN Inhale 2 L into the lungs at bedtime.     [provider]  clomiPRAMINE (ANAFRANIL) 25 MG capsule Take 25 mg by mouth at bedtime.   05/08/11  [provider]    Family History Family History  Problem  Relation Age of Onset  . COPD Father   . Cancer Father        lung  . COPD Mother   . Asthma Mother   . Cancer Paternal Aunt        lung    Social History Social History   Tobacco Use  . Smoking status: Current Every Day Smoker    Packs/day: 1.00    Years: 47.00    Pack years: 47.00    Types: Cigarettes  . Smokeless tobacco: Never Used  Substance Use Topics  . Alcohol use: No  . Drug use: No     Allergies   Contrast media [iodinated diagnostic agents]; Doxycycline; Erythromycin; Penicillins; Amoxicillin; and Metronidazole  Review of Systems Review of Systems  Constitutional: Negative for diaphoresis and fever.  Eyes: Negative for redness.  Respiratory: Positive for cough, shortness of breath and wheezing.   Cardiovascular: Negative for chest pain, palpitations and leg swelling.  Gastrointestinal: Negative for abdominal pain, nausea and vomiting.  Genitourinary: Negative for dysuria.  Musculoskeletal: Positive for back pain and myalgias. Negative for neck pain.  Skin: Negative for rash.  Neurological: Negative for syncope and light-headedness.  Psychiatric/Behavioral: The patient is not nervous/anxious.      Physical Exam Updated Vital Signs Ht 5\' 5"  (1.651 m)   Wt 50.3 kg   SpO2 91%   BMI 18.47 kg/m   Physical Exam  Constitutional: She appears well-developed and well-nourished.  HENT:  Head: Normocephalic and atraumatic.  Mouth/Throat: Oropharynx is clear and moist.  Skin lesion on nose  Eyes: Conjunctivae are normal. Right eye exhibits no discharge. Left eye exhibits no discharge.  Neck: Normal range of motion. Neck supple.  Cardiovascular: Normal rate, regular rhythm and normal heart sounds.  No murmur heard. Pulmonary/Chest: Effort normal. No respiratory distress. She has decreased breath sounds. She has wheezes (scattered, mild).  Abdominal: Soft. There is no tenderness. There is no rebound and no guarding.  Musculoskeletal: She exhibits no edema  or tenderness.  No LE edema  Neurological: She is alert.  Skin: Skin is warm and dry.  Psychiatric: She has a normal mood and affect.  Nursing note and vitals reviewed.    ED Treatments / Results  Labs (all labs ordered are listed, but only abnormal results are displayed) Labs Reviewed  CBC WITH DIFFERENTIAL/PLATELET - Abnormal; Notable for the following components:      Result Value   WBC 15.2 (*)    Neutro Abs 12.1 (*)    Abs Immature Granulocytes 0.09 (*)    All other components within normal limits  BASIC METABOLIC PANEL - Abnormal; Notable for the following components:   Potassium 3.2 (*)    Glucose, Bld 279 (*)    BUN 40 (*)    GFR calc non Af Amer 59 (*)    All other components within normal limits  URINALYSIS, ROUTINE W REFLEX MICROSCOPIC  I-STAT CG4 LACTIC ACID, ED  I-STAT TROPONIN, ED  CBG MONITORING, ED    ED ECG REPORT   Date: 12/10/2017  Rate: 96  Rhythm: normal sinus rhythm  QRS Axis: normal  Intervals: normal  ST/T Wave abnormalities: nonspecific ST/T changes  Conduction Disutrbances:none  Narrative Interpretation:   Old EKG Reviewed: changes noted, new TWI  I have personally reviewed the EKG tracing and agree with the computerized printout as noted.  Radiology Dg Chest 2 View  Result Date: 12/10/2017 CLINICAL DATA:  Weakness 3 days.  Altered mental status. EXAM: CHEST - 2 VIEW COMPARISON:  05/24/2017 FINDINGS: Patient is slightly rotated to the right. Lungs are adequately inflated demonstrate airspace process over the right middle and lower lobe likely pneumonia. No significant effusion. Left lung is clear. Cardiomediastinal silhouette and remainder of the exam is unchanged. IMPRESSION: Patchy airspace process over the right middle and lower lobe likely pneumonia. Electronically Signed   By: Marin Olp M.D.   On: 12/10/2017 21:05    Procedures Procedures (including critical care time)  Medications Ordered in ED Medications  levofloxacin  (LEVAQUIN) IVPB 750 mg (has no administration in time range)     Initial Impression / Assessment and Plan / ED Course  I have reviewed the triage vital signs and the nursing notes.  Pertinent  labs & imaging results that were available during my care of the patient were reviewed by me and considered in my medical decision making (see chart for details).     Patient seen and examined. Work-up initiated. Medications ordered.  Patient requesting additional pain medications.  Politely discussed with patient and daughter at bedside that I do not feel comfortable giving her more pain medications at this time given that she is borderline hypoxic on 4 L.  We will readdress her situation when the initial labs and chest x-ray are obtained.  Vital signs reviewed and are as follows: BP 104/79   Pulse 94   Resp (!) 32   Ht 5\' 5"  (1.651 m)   Wt 50.3 kg   SpO2 94%   BMI 18.47 kg/m   10:18 PM Patient updated.  She will require admission.  Spoke with Dr. Hal Hope who will see.   Final Clinical Impressions(s) / ED Diagnoses   Final diagnoses:  Multifocal pneumonia  Hyperglycemia  Acute on chronic respiratory failure with hypoxia ([place])   Admit.  ED Discharge Orders    None       Carlisle Cater, Hershal Coria 12/10/17 2220    Daleen Bo, MD 12/11/17 1540

## 2017-12-10 NOTE — ED Notes (Signed)
[name] pt daughter [phone]

## 2017-12-11 ENCOUNTER — Observation Stay (HOSPITAL_COMMUNITY): Payer: Medicare Other

## 2017-12-11 DIAGNOSIS — J189 Pneumonia, unspecified organism: Secondary | ICD-10-CM | POA: Diagnosis not present

## 2017-12-11 DIAGNOSIS — F17210 Nicotine dependence, cigarettes, uncomplicated: Secondary | ICD-10-CM | POA: Diagnosis present

## 2017-12-11 DIAGNOSIS — F419 Anxiety disorder, unspecified: Secondary | ICD-10-CM | POA: Diagnosis present

## 2017-12-11 DIAGNOSIS — F329 Major depressive disorder, single episode, unspecified: Secondary | ICD-10-CM | POA: Diagnosis present

## 2017-12-11 DIAGNOSIS — J449 Chronic obstructive pulmonary disease, unspecified: Secondary | ICD-10-CM | POA: Diagnosis not present

## 2017-12-11 DIAGNOSIS — E43 Unspecified severe protein-calorie malnutrition: Secondary | ICD-10-CM | POA: Diagnosis present

## 2017-12-11 DIAGNOSIS — E876 Hypokalemia: Secondary | ICD-10-CM | POA: Diagnosis present

## 2017-12-11 DIAGNOSIS — F909 Attention-deficit hyperactivity disorder, unspecified type: Secondary | ICD-10-CM | POA: Diagnosis present

## 2017-12-11 DIAGNOSIS — J440 Chronic obstructive pulmonary disease with acute lower respiratory infection: Secondary | ICD-10-CM | POA: Diagnosis present

## 2017-12-11 DIAGNOSIS — E1165 Type 2 diabetes mellitus with hyperglycemia: Secondary | ICD-10-CM | POA: Diagnosis present

## 2017-12-11 DIAGNOSIS — E1159 Type 2 diabetes mellitus with other circulatory complications: Secondary | ICD-10-CM | POA: Diagnosis not present

## 2017-12-11 DIAGNOSIS — J9621 Acute and chronic respiratory failure with hypoxia: Secondary | ICD-10-CM | POA: Diagnosis present

## 2017-12-11 DIAGNOSIS — K219 Gastro-esophageal reflux disease without esophagitis: Secondary | ICD-10-CM | POA: Diagnosis present

## 2017-12-11 DIAGNOSIS — I5032 Chronic diastolic (congestive) heart failure: Secondary | ICD-10-CM | POA: Diagnosis present

## 2017-12-11 DIAGNOSIS — G894 Chronic pain syndrome: Secondary | ICD-10-CM | POA: Diagnosis present

## 2017-12-11 DIAGNOSIS — Z981 Arthrodesis status: Secondary | ICD-10-CM | POA: Diagnosis not present

## 2017-12-11 DIAGNOSIS — Z681 Body mass index (BMI) 19 or less, adult: Secondary | ICD-10-CM | POA: Diagnosis not present

## 2017-12-11 DIAGNOSIS — I480 Paroxysmal atrial fibrillation: Secondary | ICD-10-CM | POA: Diagnosis present

## 2017-12-11 DIAGNOSIS — E785 Hyperlipidemia, unspecified: Secondary | ICD-10-CM | POA: Diagnosis present

## 2017-12-11 DIAGNOSIS — R471 Dysarthria and anarthria: Secondary | ICD-10-CM | POA: Diagnosis present

## 2017-12-11 DIAGNOSIS — Z825 Family history of asthma and other chronic lower respiratory diseases: Secondary | ICD-10-CM | POA: Diagnosis not present

## 2017-12-11 DIAGNOSIS — I110 Hypertensive heart disease with heart failure: Secondary | ICD-10-CM | POA: Diagnosis present

## 2017-12-11 DIAGNOSIS — Z90710 Acquired absence of both cervix and uterus: Secondary | ICD-10-CM | POA: Diagnosis not present

## 2017-12-11 DIAGNOSIS — I10 Essential (primary) hypertension: Secondary | ICD-10-CM | POA: Diagnosis not present

## 2017-12-11 DIAGNOSIS — Z85828 Personal history of other malignant neoplasm of skin: Secondary | ICD-10-CM | POA: Diagnosis not present

## 2017-12-11 DIAGNOSIS — E1151 Type 2 diabetes mellitus with diabetic peripheral angiopathy without gangrene: Secondary | ICD-10-CM | POA: Diagnosis present

## 2017-12-11 LAB — CBC
HEMATOCRIT: 37.6 % (ref 36.0–46.0)
HEMOGLOBIN: 11.4 g/dL — AB (ref 12.0–15.0)
MCH: 27 pg (ref 26.0–34.0)
MCHC: 30.3 g/dL (ref 30.0–36.0)
MCV: 89.1 fL (ref 80.0–100.0)
NRBC: 0 % (ref 0.0–0.2)
Platelets: 336 10*3/uL (ref 150–400)
RBC: 4.22 MIL/uL (ref 3.87–5.11)
RDW: 12.9 % (ref 11.5–15.5)
WBC: 10.2 10*3/uL (ref 4.0–10.5)

## 2017-12-11 LAB — GLUCOSE, CAPILLARY
GLUCOSE-CAPILLARY: 127 mg/dL — AB (ref 70–99)
GLUCOSE-CAPILLARY: 105 mg/dL — AB (ref 70–99)
GLUCOSE-CAPILLARY: 178 mg/dL — AB (ref 70–99)
Glucose-Capillary: 168 mg/dL — ABNORMAL HIGH (ref 70–99)

## 2017-12-11 LAB — BASIC METABOLIC PANEL
ANION GAP: 13 (ref 5–15)
BUN: 31 mg/dL — ABNORMAL HIGH (ref 8–23)
CHLORIDE: 100 mmol/L (ref 98–111)
CO2: 29 mmol/L (ref 22–32)
Calcium: 9 mg/dL (ref 8.9–10.3)
Creatinine, Ser: 0.77 mg/dL (ref 0.44–1.00)
Glucose, Bld: 131 mg/dL — ABNORMAL HIGH (ref 70–99)
POTASSIUM: 2.4 mmol/L — AB (ref 3.5–5.1)
Sodium: 142 mmol/L (ref 135–145)

## 2017-12-11 LAB — MAGNESIUM: MAGNESIUM: 1.6 mg/dL — AB (ref 1.7–2.4)

## 2017-12-11 LAB — PREALBUMIN: PREALBUMIN: 14.7 mg/dL — AB (ref 18–38)

## 2017-12-11 LAB — HIV ANTIBODY (ROUTINE TESTING W REFLEX): HIV SCREEN 4TH GENERATION: NONREACTIVE

## 2017-12-11 MED ORDER — SODIUM CHLORIDE 0.9 % IV SOLN
INTRAVENOUS | Status: DC | PRN
  Administered 2017-12-11: 250 mL via INTRAVENOUS

## 2017-12-11 MED ORDER — POTASSIUM CHLORIDE CRYS ER 20 MEQ PO TBCR
40.0000 meq | EXTENDED_RELEASE_TABLET | ORAL | Status: AC
  Administered 2017-12-11 (×3): 40 meq via ORAL
  Filled 2017-12-11 (×3): qty 2

## 2017-12-11 NOTE — Progress Notes (Signed)
Pt arrived to [facility] [place] [facility]. Alert and oriented x 4, pt identified appropriately. Vital signs stable, no signs or symptoms of acute stress. Pt placed on cardiac monitor and continuous pulse ox.  Pt oriented to room and equipment, instructed to use call bell for assistance and call bell left within reach. Bed alarm in place. Belongings policy discussed with pt. Will continue to monitor and treat pt per MD orders.

## 2017-12-11 NOTE — Progress Notes (Addendum)
Progress Note    [name]  IDP:[id number] DOB: 1955-08-14  DOA: 12/10/2017 PCP: Elwyn Reach, MD    Brief Narrative:     Medical records reviewed and are as summarized below:  [name] is an 62 y.o. female with history of COPD with ongoing tobacco abuse, anxiety, hypertension presents to the ER with complaints of worsening shortness of breath with productive cough for the last 3 days.  Has been having greenish sputum production on coughing. Has lost 50lbs in last 2 years unintentionally.    Assessment/Plan:   Principal Problem:   CAP (community acquired pneumonia) Active Problems:   COPD GOLD II/ still smoking    Chronic diastolic CHF (congestive heart failure) (HCC)   PAF (paroxysmal atrial fibrillation) (HCC)   Type 2 diabetes mellitus with vascular disease ([place])   Essential hypertension  CAP -seen on x ray as well as CT scan -will need repeat CT Scan to r/o ensure resolution -IV Abx -patient not taking in much PO so will continue IV Abx -nutrition consult  Chronic respiratory failure -wears 2L at night only  Tobacco abuse -encouraged cessation  Hypokalemia/hypomagnesemia -replete both and recheck -d/c HCTZ  Weight loss -further work up as an outpatient -says she has no appetite -nutrition consult  COPD -encouraged smoking cessation -nebs  HTN -BP on low side -d/c HCTZ, hold metoprolol -holding parameters for medications  DM -SSI Hold metformin  Chronic pain -on Neurontin and norco  Family Communication/Anticipated D/C date and plan/Code Status   DVT prophylaxis: Lovenox ordered. Code Status: Full Code.  Family Communication:patient Disposition Plan: PT eval   Medical Consultants:    None.   Subjective:   Has lost 50 lbs in last 2 years  Objective:    Vitals:   12/11/17 0005 12/11/17 0500 12/11/17 0611 12/11/17 1219  BP: 103/71  112/81 116/79  Pulse: 69  64 76  Resp:   20 20  Temp: 98.5 F (36.9 C)   97.9 F (36.6 C) 98.4 F (36.9 C)  TempSrc: Oral  Oral Oral  SpO2: 96%  98%   Weight: 48.2 kg 49.2 kg    Height: 5\' 5"  (1.651 m)      No intake or output data in the 24 hours ending 12/11/17 1253 Filed Weights   12/10/17 2025 12/11/17 0005 12/11/17 0500  Weight: 50.3 kg 48.2 kg 49.2 kg    Exam: In bed, older than stated age- speech difficult to understand at times Diminished breath sounds rrr Sunken temporal areas No rashes or lesions  Data Reviewed:   I have personally reviewed following labs and imaging studies:  Labs: Labs show the following:   Basic Metabolic Panel: Recent Labs  Lab 12/10/17 2017 12/11/17 0711  NA 138 142  K 3.2* 2.4*  CL 99 100  CO2 28 29  GLUCOSE 279* 131*  BUN 40* 31*  CREATININE 0.99 0.77  CALCIUM 8.9 9.0  MG  --  1.6*   GFR Estimated Creatinine Clearance: 55.9 mL/min (by C-G formula based on SCr of 0.77 mg/dL). Liver Function Tests: No results for input(s): AST, ALT, ALKPHOS, BILITOT, PROT, ALBUMIN in the last 168 hours. No results for input(s): LIPASE, AMYLASE in the last 168 hours. No results for input(s): AMMONIA in the last 168 hours. Coagulation profile No results for input(s): INR, PROTIME in the last 168 hours.  CBC: Recent Labs  Lab 12/10/17 2017 12/11/17 0711  WBC 15.2* 10.2  NEUTROABS 12.1*  --   HGB 12.3  11.4*  HCT 39.9 37.6  MCV 89.3 89.1  PLT 389 336   Cardiac Enzymes: No results for input(s): CKTOTAL, CKMB, CKMBINDEX, TROPONINI in the last 168 hours. BNP (last 3 results) No results for input(s): PROBNP in the last 8760 hours. CBG: Recent Labs  Lab 12/10/17 2301 12/11/17 0742 12/11/17 1217  GLUCAP 203* 127* 105*   D-Dimer: No results for input(s): DDIMER in the last 72 hours. Hgb A1c: No results for input(s): HGBA1C in the last 72 hours. Lipid Profile: No results for input(s): CHOL, HDL, LDLCALC, TRIG, CHOLHDL, LDLDIRECT in the last 72 hours. Thyroid function studies: No results for input(s):  TSH, T4TOTAL, T3FREE, THYROIDAB in the last 72 hours.  Invalid input(s): FREET3 Anemia work up: No results for input(s): VITAMINB12, FOLATE, FERRITIN, TIBC, IRON, RETICCTPCT in the last 72 hours. Sepsis Labs: Recent Labs  Lab 12/10/17 2017 12/10/17 2044 12/11/17 0711  WBC 15.2*  --  10.2  LATICACIDVEN  --  1.10  --     Microbiology No results found for this or any previous visit (from the past 240 hour(s)).  Procedures and diagnostic studies:  Dg Chest 2 View  Result Date: 12/10/2017 CLINICAL DATA:  Weakness 3 days.  Altered mental status. EXAM: CHEST - 2 VIEW COMPARISON:  05/24/2017 FINDINGS: Patient is slightly rotated to the right. Lungs are adequately inflated demonstrate airspace process over the right middle and lower lobe likely pneumonia. No significant effusion. Left lung is clear. Cardiomediastinal silhouette and remainder of the exam is unchanged. IMPRESSION: Patchy airspace process over the right middle and lower lobe likely pneumonia. Electronically Signed   By: Marin Olp M.D.   On: 12/10/2017 21:05   Ct Chest Wo Contrast  Result Date: 12/11/2017 CLINICAL DATA:  Cough, congestion. EXAM: CT CHEST WITHOUT CONTRAST TECHNIQUE: Multidetector CT imaging of the chest was performed following the standard protocol without IV contrast. COMPARISON:  Radiographs of December 10, 2017. FINDINGS: Cardiovascular: Atherosclerosis of thoracic aorta is noted without aneurysm formation. Normal cardiac size. No pericardial effusion. Coronary artery calcifications are noted. Mediastinum/Nodes: No enlarged mediastinal or axillary lymph nodes. Thyroid gland, trachea, and esophagus demonstrate no significant findings. Lungs/Pleura: No pneumothorax or pleural effusion is noted. Emphysematous disease is noted in both upper lobes. Right lower lobe airspace opacity is noted most consistent with pneumonia. Upper Abdomen: No acute abnormality. Musculoskeletal: No chest wall mass or suspicious bone  lesions identified. IMPRESSION: Right lower lobe airspace opacity most consistent with pneumonia. Followup PA and lateral chest X-ray is recommended in 3-4 weeks following trial of antibiotic therapy to ensure resolution and exclude underlying malignancy. Coronary artery calcifications are noted consistent with coronary artery disease. Aortic Atherosclerosis (ICD10-I70.0) and Emphysema (ICD10-J43.9). Electronically Signed   By: Marijo Conception, M.D.   On: 12/11/2017 11:50    Medications:   . ALPRAZolam  1 mg Oral TID WC   And  . ALPRAZolam  2 mg Oral QHS  . enoxaparin (LOVENOX) injection  40 mg Subcutaneous Daily  . gabapentin  100 mg Oral TID  . losartan  50 mg Oral Daily   And  . hydrochlorothiazide  12.5 mg Oral Daily  . insulin aspart  0-9 Units Subcutaneous TID WC  . potassium chloride  40 mEq Oral Q2H   Continuous Infusions: . levofloxacin (LEVAQUIN) IV       LOS: 0 days   Geradine Girt  [facility]   *Please refer to [email or web address], password [email or web address] to get updated schedule on who will round on this patient,  as hospitalists switch teams weekly. If 7PM-7AM, please contact night-coverage at [email or web address], password [email or web address] for any overnight needs.  12/11/2017, 12:53 PM

## 2017-12-11 NOTE — Progress Notes (Addendum)
CRITICAL VALUE STICKER  CRITICAL VALUE: Potassium 2.4   DATE & TIME NOTIFIED: 8:38 AM   MD NOTIFIED: Eulogio Bear MD   RESPONSE:

## 2017-12-11 NOTE — Progress Notes (Signed)
Pt sleeping at this time.

## 2017-12-12 DIAGNOSIS — J449 Chronic obstructive pulmonary disease, unspecified: Secondary | ICD-10-CM

## 2017-12-12 DIAGNOSIS — I10 Essential (primary) hypertension: Secondary | ICD-10-CM

## 2017-12-12 DIAGNOSIS — J189 Pneumonia, unspecified organism: Principal | ICD-10-CM

## 2017-12-12 LAB — CBC
HCT: 34.3 % — ABNORMAL LOW (ref 36.0–46.0)
HEMOGLOBIN: 10.4 g/dL — AB (ref 12.0–15.0)
MCH: 27.5 pg (ref 26.0–34.0)
MCHC: 30.3 g/dL (ref 30.0–36.0)
MCV: 90.7 fL (ref 80.0–100.0)
Platelets: 311 10*3/uL (ref 150–400)
RBC: 3.78 MIL/uL — ABNORMAL LOW (ref 3.87–5.11)
RDW: 12.9 % (ref 11.5–15.5)
WBC: 11.4 10*3/uL — ABNORMAL HIGH (ref 4.0–10.5)
nRBC: 0 % (ref 0.0–0.2)

## 2017-12-12 LAB — URINALYSIS, ROUTINE W REFLEX MICROSCOPIC
Bilirubin Urine: NEGATIVE
GLUCOSE, UA: NEGATIVE mg/dL
Hgb urine dipstick: NEGATIVE
Ketones, ur: NEGATIVE mg/dL
NITRITE: NEGATIVE
PROTEIN: NEGATIVE mg/dL
Specific Gravity, Urine: 1.025 (ref 1.005–1.030)
pH: 6 (ref 5.0–8.0)

## 2017-12-12 LAB — BASIC METABOLIC PANEL
Anion gap: 5 (ref 5–15)
BUN: 27 mg/dL — AB (ref 8–23)
CO2: 31 mmol/L (ref 22–32)
Calcium: 8.8 mg/dL — ABNORMAL LOW (ref 8.9–10.3)
Chloride: 106 mmol/L (ref 98–111)
Creatinine, Ser: 0.83 mg/dL (ref 0.44–1.00)
GFR calc Af Amer: >60 mL/min (ref >60)
GFR calc non Af Amer: >60 mL/min (ref >60)
Glucose, Bld: 109 mg/dL — ABNORMAL HIGH (ref 70–99)
Potassium: 3.4 mmol/L — ABNORMAL LOW (ref 3.5–5.1)
SODIUM: 142 mmol/L (ref 135–145)

## 2017-12-12 LAB — GLUCOSE, CAPILLARY
GLUCOSE-CAPILLARY: 122 mg/dL — AB (ref 70–99)
Glucose-Capillary: 221 mg/dL — ABNORMAL HIGH (ref 70–99)
Glucose-Capillary: 96 mg/dL (ref 70–99)
Glucose-Capillary: 166 mg/dL — ABNORMAL HIGH (ref 70–99)

## 2017-12-12 LAB — EXPECTORATED SPUTUM ASSESSMENT W GRAM STAIN, RFLX TO RESP C

## 2017-12-12 MED ORDER — SODIUM CHLORIDE 0.9 % IV BOLUS
500.0000 mL | Freq: Once | INTRAVENOUS | Status: AC
  Administered 2017-12-12: 500 mL via INTRAVENOUS

## 2017-12-12 MED ORDER — GUAIFENESIN ER 600 MG PO TB12
600.0000 mg | ORAL_TABLET | Freq: Two times a day (BID) | ORAL | Status: DC
  Administered 2017-12-12 – 2017-12-14 (×5): 600 mg via ORAL
  Filled 2017-12-12 (×5): qty 1

## 2017-12-12 MED ORDER — POTASSIUM CHLORIDE CRYS ER 20 MEQ PO TBCR
40.0000 meq | EXTENDED_RELEASE_TABLET | Freq: Once | ORAL | Status: AC
  Administered 2017-12-12: 40 meq via ORAL
  Filled 2017-12-12: qty 2

## 2017-12-12 MED ORDER — BUDESONIDE 0.25 MG/2ML IN SUSP
0.2500 mg | Freq: Two times a day (BID) | RESPIRATORY_TRACT | Status: DC
  Administered 2017-12-12 – 2017-12-14 (×4): 0.25 mg via RESPIRATORY_TRACT
  Filled 2017-12-12 (×5): qty 2

## 2017-12-12 MED ORDER — ENSURE ENLIVE PO LIQD
237.0000 mL | Freq: Two times a day (BID) | ORAL | Status: DC
  Administered 2017-12-12 – 2017-12-14 (×4): 237 mL via ORAL

## 2017-12-12 MED ORDER — ARFORMOTEROL TARTRATE 15 MCG/2ML IN NEBU
15.0000 ug | INHALATION_SOLUTION | Freq: Two times a day (BID) | RESPIRATORY_TRACT | Status: DC
  Administered 2017-12-12 – 2017-12-14 (×4): 15 ug via RESPIRATORY_TRACT
  Filled 2017-12-12 (×5): qty 2

## 2017-12-12 MED ORDER — ORAL CARE MOUTH RINSE
15.0000 mL | Freq: Two times a day (BID) | OROMUCOSAL | Status: DC
  Administered 2017-12-12 – 2017-12-14 (×6): 15 mL via OROMUCOSAL

## 2017-12-12 NOTE — Care Management Note (Addendum)
Case Management Note  Patient Details  Name: [name] MRN: [id number] Date of Birth: 10/05/1955  Subjective/Objective:    CAP, COPD, HTN DM                Action/Plan: Received call from [facility] and pt active with [facility] and PT. Attending made aware. HH resumption of care orders entered.   Expected Discharge Date:  12/11/17               Expected Discharge Plan:  [place]  In-House Referral:  NA  Discharge planning Services  CM Consult  Post Acute Care Choice:  Home Health, Resumption of Svcs/PTA Provider Choice offered to:  Patient  DME Arranged:  N/A DME Agency:  NA  HH Arranged:  RN, PT [place] Agency:  [facility] (formerly [employer])  Status of Service:  In process, will continue to follow  If discussed at Long Length of Stay Meetings, dates discussed:    Additional Comments:  Erenest Rasher, RN 12/12/2017, 4:01 PM

## 2017-12-12 NOTE — Progress Notes (Signed)
Pt has not voided for 6 hours, bladder scanner indicated 170 cc of urine in bladder. Pt refused PO fluids. No continuous IV fluids ordered. Schorr, NP notified. Will continue to monitor pt.

## 2017-12-12 NOTE — Progress Notes (Signed)
Initial Nutrition Assessment  DOCUMENTATION CODES:   Severe malnutrition in context of chronic illness, Underweight  INTERVENTION:   - Liberalize diet to Regular (verbal with readback order per MD placed)  - Ensure Enlive po TID with meals, each supplement provides 350 kcal and 20 grams of protein  Pt with poor oral intake and would benefit from nutrient-dense supplement. One Ensure Enlive supplement provides 350 kcals, 20 grams protein, and 44-45 grams of carbohydrate vs one Glucerna supplement, which provides 220 kcals, 10 grams of protein, and 26 grams of carbohydrate. Given pt's hx of DM, RD will continue to monitor PO intake and CBG's and adjust supplement regimen as appropriate.  - Encourage adequate PO intake  NUTRITION DIAGNOSIS:   Severe Malnutrition related to chronic illness (COPD, CHF) as evidenced by severe fat depletion, severe muscle depletion, percent weight loss (24.7% weight loss in 4 months).  GOAL:   Patient will meet greater than or equal to 90% of their needs  MONITOR:   PO intake, Supplement acceptance, Skin, Weight trends, Labs  REASON FOR ASSESSMENT:   Consult Poor PO  ASSESSMENT:   62 year old female who presented to the ED on 10/28 with weakness and cough. PMH significant for COPD on oxygen at night and ongoing tobacco use, anxiety, chronic pain, CHF, GERD, hypertension, and type 2 diabetes mellitus. Admitted with CAP.  Spoke with pt who was resting in recliner at time of RD visit. Pt reports experiencing pain in her rib cage and left leg and that left leg pain in chronic.  Pt reports having a poor appetite x 1 week PTA. Pt reports eating no solid foods during this time and just drinking water and Sprite. Pt reports that when she is feeling well, her appetite is "alright" and she eats 3 meals daily that her daughter makes.  Breakfast: cereal Lunch: sandwich Dinner: "whole meal"  Pt denies drinking oral nutrition supplements PTA but is willing to  try Ensure Enlive during admission. RD provided pt with chocolate Ensure Enlive and alerted RN.  Pt endorses weight loss and reports her UBW as 170 lbs. Pt states that she last weighed this 1 year ago. Per weight history in chart, pt has lost 35 lbs over the past 4 months. This is a 24.7% weight loss which is severe and significant for timeframe.  Encouraged pt to continue drinking oral nutrition supplements after discharge.  Pt denies nausea/vomiting and denies any issues chewing or swallowing.  Medications reviewed and include: sliding scale Novolog, IV antibiotics  Labs reviewed: potassium 3.4 (L), BUN 27 (H) CBG's: 122, 178, 168, 105 x 24 hours  NUTRITION - FOCUSED PHYSICAL EXAM:    Most Recent Value  Orbital Region  Severe depletion  Upper Arm Region  Moderate depletion  Thoracic and Lumbar Region  Severe depletion  Buccal Region  Severe depletion  Temple Region  Severe depletion  Clavicle Bone Region  Severe depletion  Clavicle and Acromion Bone Region  Severe depletion  Scapular Bone Region  Moderate depletion  Dorsal Hand  Moderate depletion  Patellar Region  Moderate depletion  Anterior Thigh Region  Severe depletion  Posterior Calf Region  Moderate depletion  Edema (RD Assessment)  None  Hair  Reviewed  Eyes  Reviewed  Mouth  Reviewed  Skin  Reviewed  Nails  Reviewed       Diet Order:   Diet Order            Diet regular Room service appropriate? Yes; Fluid consistency: Thin  Diet  effective now              EDUCATION NEEDS:   Education needs have been addressed  Skin:  Skin Assessment: Skin Integrity Issues: Stage I: sacrum  Last BM:  10/28  Height:   Ht Readings from Last 1 Encounters:  12/11/17 5\' 5"  (1.651 m)    Weight:   Wt Readings from Last 1 Encounters:  12/12/17 48.9 kg    Ideal Body Weight:  56.82 kg  BMI:  Body mass index is 17.94 kg/m.  Estimated Nutritional Needs:   Kcal:  1600-1800  Protein:  80-90 grams  Fluid:   >/= 1.6 L    Gaynell Face, MS, RD, LDN Inpatient Clinical Dietitian Pager: [phone] Weekend/After Hours: [phone]

## 2017-12-12 NOTE — Progress Notes (Addendum)
PROGRESS NOTE        PATIENT DETAILS Name: [name] Age: 62 y.o. Sex: female Date of Birth: 04-10-1955 Admit Date: 12/10/2017 Admitting Physician Rise Patience, MD WCB:[id number], Henderson Newcomer, MD  Brief Narrative: Patient is a 62 y.o. female with history of COPD, hypertension, anxiety, tobacco use presented with cough, shortness of breath for 3 days prior to this hospital stay, found to have pneumonia on imaging.  Started on empiric levofloxacin and admitted to the hospitalist service.  See below for further details  Subjective: Looks very frail and weak-continues to cough-although came some clinical improvement compared to the past few days.  Assessment/Plan: Community-acquired pneumonia: Only minimally improved-appears very frail/weak-still has some mild leukocytosis-needs continued IV antimicrobial therapy.  Have added Mucinex, bronchodilators-we will encourage incentive spirometry/flutter valve.  Needs mobilization-PT evaluation ordered-out of bed to chair as well.  COPD on home O2: Lungs are clear without any evidence of exacerbation.  Apparently noncompliant with inhalers at home-is on 2 L of oxygen but mostly at night.  Have started Brovana and budesonide.   Hypertension: Controlled-continue losartan  DM-2: CBG stable with SSI-continue to hold metformin.  Anxiety: Slightly more anxious than usual due to acute illness-continue Xanax.  Hypokalemia: Replete and recheck.  Tobacco abuse: Not sure if she has any intention of quitting at this point.  We will continue to counsel.  Severe malnutrition: Continue supplements  DVT Prophylaxis: Prophylactic Lovenox   Code Status: Full code  Family Communication: None at bedside  Disposition Plan: Remain inpatient-probably requires another day or so in the hospital-but will follow clinically.  Antimicrobial agents: Anti-infectives (From admission, onward)   Start     Dose/Rate Route Frequency  Ordered Stop   12/11/17 2000  levofloxacin (LEVAQUIN) IVPB 750 mg     750 mg 100 mL/hr over 90 Minutes Intravenous Every 24 hours 12/10/17 2323 12/16/17 1959   12/10/17 2215  levofloxacin (LEVAQUIN) IVPB 750 mg     750 mg 100 mL/hr over 90 Minutes Intravenous  Once 12/10/17 2205 12/11/17 0051      Procedures: None  CONSULTS:  None  Time spent: 25- minutes-Greater than 50% of this time was spent in counseling, explanation of diagnosis, planning of further management, and coordination of care.  MEDICATIONS: Scheduled Meds: . ALPRAZolam  1 mg Oral TID WC   And  . ALPRAZolam  2 mg Oral QHS  . arformoterol  15 mcg Nebulization BID  . budesonide (PULMICORT) nebulizer solution  0.25 mg Nebulization BID  . enoxaparin (LOVENOX) injection  40 mg Subcutaneous Daily  . feeding supplement (ENSURE ENLIVE)  237 mL Oral BID BM  . gabapentin  100 mg Oral TID  . guaiFENesin  600 mg Oral BID  . insulin aspart  0-9 Units Subcutaneous TID WC  . losartan  50 mg Oral Daily  . mouth rinse  15 mL Mouth Rinse BID   Continuous Infusions: . sodium chloride 250 mL (12/11/17 2043)  . levofloxacin (LEVAQUIN) IV 750 mg (12/11/17 2045)   PRN Meds:.sodium chloride, acetaminophen **OR** acetaminophen, HYDROcodone-acetaminophen, levalbuterol, ondansetron **OR** ondansetron (ZOFRAN) IV   PHYSICAL EXAM: Vital signs: Vitals:   12/11/17 1954 12/12/17 0500 12/12/17 0552 12/12/17 0800  BP: 105/69  91/69   Pulse: 87  78   Resp: 17  17   Temp: 98.5 F (36.9 C)  98.9 F (37.2 C)  TempSrc: Oral  Oral   SpO2: 93%  96% 97%  Weight:  48.9 kg    Height:       Filed Weights   12/11/17 0005 12/11/17 0500 12/12/17 0500  Weight: 48.2 kg 49.2 kg 48.9 kg   Body mass index is 17.94 kg/m.   General appearance :Awake, alert, not in any distress.  Looks frail and much more older than stated age. Eyes:Pink conjunctiva HEENT: Atraumatic and Normocephalic Neck: supple Resp:Good air entry bilaterally, no added  sounds  CVS: S1 S2 regular,+ right basal rales GI: Bowel sounds present, Non tender and not distended with no gaurding, rigidity or rebound. Extremities: B/L Lower Ext shows no edema, both legs are warm to touch Neurology:  speech clear,Non focal, sensation is grossly intact. Musculoskeletal:No digital cyanosis Skin:No Rash, warm and dry Wounds:N/A  I have personally reviewed following labs and imaging studies  LABORATORY DATA: CBC: Recent Labs  Lab 12/10/17 2017 12/11/17 0711 12/12/17 0354  WBC 15.2* 10.2 11.4*  NEUTROABS 12.1*  --   --   HGB 12.3 11.4* 10.4*  HCT 39.9 37.6 34.3*  MCV 89.3 89.1 90.7  PLT 389 336 563    Basic Metabolic Panel: Recent Labs  Lab 12/10/17 2017 12/11/17 0711 12/12/17 0354  NA 138 142 142  K 3.2* 2.4* 3.4*  CL 99 100 106  CO2 28 29 31   GLUCOSE 279* 131* 109*  BUN 40* 31* 27*  CREATININE 0.99 0.77 0.83  CALCIUM 8.9 9.0 8.8*  MG  --  1.6*  --     GFR: Estimated Creatinine Clearance: 53.6 mL/min (by C-G formula based on SCr of 0.83 mg/dL).  Liver Function Tests: No results for input(s): AST, ALT, ALKPHOS, BILITOT, PROT, ALBUMIN in the last 168 hours. No results for input(s): LIPASE, AMYLASE in the last 168 hours. No results for input(s): AMMONIA in the last 168 hours.  Coagulation Profile: No results for input(s): INR, PROTIME in the last 168 hours.  Cardiac Enzymes: No results for input(s): CKTOTAL, CKMB, CKMBINDEX, TROPONINI in the last 168 hours.  BNP (last 3 results) No results for input(s): PROBNP in the last 8760 hours.  HbA1C: No results for input(s): HGBA1C in the last 72 hours.  CBG: Recent Labs  Lab 12/11/17 1217 12/11/17 1718 12/11/17 2218 12/12/17 0753 12/12/17 1353  GLUCAP 105* 168* 178* 122* 221*    Lipid Profile: No results for input(s): CHOL, HDL, LDLCALC, TRIG, CHOLHDL, LDLDIRECT in the last 72 hours.  Thyroid Function Tests: No results for input(s): TSH, T4TOTAL, FREET4, T3FREE, THYROIDAB in the  last 72 hours.  Anemia Panel: No results for input(s): VITAMINB12, FOLATE, FERRITIN, TIBC, IRON, RETICCTPCT in the last 72 hours.  Urine analysis:    Component Value Date/Time   COLORURINE YELLOW 12/12/2017 [place] 12/12/2017 0652   LABSPEC 1.025 12/12/2017 0652   PHURINE 6.0 12/12/2017 0652   GLUCOSEU NEGATIVE 12/12/2017 0652   HGBUR NEGATIVE 12/12/2017 0652   BILIRUBINUR NEGATIVE 12/12/2017 [place] 12/12/2017 0652   PROTEINUR NEGATIVE 12/12/2017 0652   UROBILINOGEN 0.2 01/03/2014 1819   NITRITE NEGATIVE 12/12/2017 0652   LEUKOCYTESUR TRACE (A) 12/12/2017 0652    Sepsis Labs: Lactic Acid, Venous    Component Value Date/Time   LATICACIDVEN 1.10 12/10/2017 2044    MICROBIOLOGY: Recent Results (from the past 240 hour(s))  Culture, sputum-assessment     Status: None   Collection Time: 12/12/17  6:53 AM  Result Value Ref Range Status   Specimen Description  SPUTUM  Final   Special Requests NONE  Final   Sputum evaluation   Final    THIS SPECIMEN IS ACCEPTABLE FOR SPUTUM CULTURE Performed at [facility], [address], [place], [place] [address]    Report Status 12/12/2017 FINAL  Final  Culture, respiratory     Status: None (Preliminary result)   Collection Time: 12/12/17  6:53 AM  Result Value Ref Range Status   Specimen Description SPUTUM  Final   Special Requests NONE Reflexed from [id number]  Final   Gram Stain   Final    ABUNDANT WBC PRESENT, PREDOMINANTLY PMN FEW GRAM POSITIVE COCCI FEW GRAM POSITIVE RODS RARE GRAM NEGATIVE RODS Performed at [facility], [address], [place], [place] [address]    Culture PENDING  Incomplete   Report Status PENDING  Incomplete    RADIOLOGY STUDIES/RESULTS: Dg Chest 2 View  Result Date: 12/10/2017 CLINICAL DATA:  Weakness 3 days.  Altered mental status. EXAM: CHEST - 2 VIEW COMPARISON:  05/24/2017 FINDINGS: Patient is slightly rotated to the right. Lungs are adequately  inflated demonstrate airspace process over the right middle and lower lobe likely pneumonia. No significant effusion. Left lung is clear. Cardiomediastinal silhouette and remainder of the exam is unchanged. IMPRESSION: Patchy airspace process over the right middle and lower lobe likely pneumonia. Electronically Signed   By: Marin Olp M.D.   On: 12/10/2017 21:05   Ct Chest Wo Contrast  Result Date: 12/11/2017 CLINICAL DATA:  Cough, congestion. EXAM: CT CHEST WITHOUT CONTRAST TECHNIQUE: Multidetector CT imaging of the chest was performed following the standard protocol without IV contrast. COMPARISON:  Radiographs of December 10, 2017. FINDINGS: Cardiovascular: Atherosclerosis of thoracic aorta is noted without aneurysm formation. Normal cardiac size. No pericardial effusion. Coronary artery calcifications are noted. Mediastinum/Nodes: No enlarged mediastinal or axillary lymph nodes. Thyroid gland, trachea, and esophagus demonstrate no significant findings. Lungs/Pleura: No pneumothorax or pleural effusion is noted. Emphysematous disease is noted in both upper lobes. Right lower lobe airspace opacity is noted most consistent with pneumonia. Upper Abdomen: No acute abnormality. Musculoskeletal: No chest wall mass or suspicious bone lesions identified. IMPRESSION: Right lower lobe airspace opacity most consistent with pneumonia. Followup PA and lateral chest X-ray is recommended in 3-4 weeks following trial of antibiotic therapy to ensure resolution and exclude underlying malignancy. Coronary artery calcifications are noted consistent with coronary artery disease. Aortic Atherosclerosis (ICD10-I70.0) and Emphysema (ICD10-J43.9). Electronically Signed   By: Marijo Conception, M.D.   On: 12/11/2017 11:50     LOS: 1 day   Oren Binet, MD  [facility]  If 7PM-7AM, please contact night-coverage  Please page via [email or web address] [email or web address]-click on MD name and type text message  12/12/2017, 2:54  PM

## 2017-12-12 NOTE — Evaluation (Signed)
Physical Therapy Evaluation Patient Details Name: [name] MRN: [id number] DOB: [birth year] Today's Date: 12/12/2017   History of Present Illness  MIYEKO Diaz is a 62 y.o. female with history of COPD with ongoing tobacco abuse, anxiety, hypertension presents to the ER with complaints of worsening shortness of breath with productive cough for the last 3 days.  Work up includes PNA.  Clinical Impression  Pt admitted with/for PNA.  Pt is significantly deconditioned.  She is needing minimal assist OOB at a minimum.  Pt currently limited functionally due to the problems listed below.  (see problems list.)  Pt will benefit from PT to maximize function and safety to be able to get home safely with available assist.     Follow Up Recommendations Home health PT;Supervision/Assistance - 24 hour    Equipment Recommendations       Recommendations for Other Services       Precautions / Restrictions Precautions Precautions: Fall      Mobility  Bed Mobility Overal bed mobility: Needs Assistance Bed Mobility: Rolling;Sidelying to Sit Rolling: Min guard Sidelying to sit: Min guard       General bed mobility comments: no rail or elevated bed needed, but visible struggle.  Transfers Overall transfer level: Needs assistance   Transfers: Sit to/from Stand Sit to Stand: Min assist         General transfer comment: stability assist, cues for hand placement  Ambulation/Gait Ambulation/Gait assistance: Min assist Gait Distance (Feet): 35 Feet Assistive device: Rolling walker (2 wheeled) Gait Pattern/deviations: Step-through pattern Gait velocity: slower Gait velocity interpretation: <1.31 ft/sec, indicative of household ambulator General Gait Details: generally unsteady and antalgic overall.  Quick to fatigue  Stairs            Wheelchair Mobility    Modified Rankin (Stroke Patients Only)       Balance Overall balance assessment: Needs assistance   Sitting  balance-Leahy Scale: Fair     Standing balance support: Bilateral upper extremity supported Standing balance-Leahy Scale: Poor Standing balance comment: reliant on the RW and now minimal external assist                             Pertinent Vitals/Pain Pain Assessment: Faces Faces Pain Scale: Hurts even more Pain Location: Left leg Pain Descriptors / Indicators: Radiating Pain Intervention(s): Monitored during session    Home Living Family/patient expects to be discharged to:: Private residence Living Arrangements: Children Available Help at Discharge: Family Type of Home: Mobile home Home Access: Stairs to enter Entrance Stairs-Rails: None Entrance Stairs-Number of Steps: 2 Home Layout: One level Home Equipment: Environmental consultant - 2 wheels      Prior Function Level of Independence: Needs assistance   Gait / Transfers Assistance Needed: Uses RW  ADL's / Homemaking Assistance Needed: granddaughter performs IADLS, friend helps her bathe        Hand Dominance   Dominant Hand: Left    Extremity/Trunk Assessment   Upper Extremity Assessment Upper Extremity Assessment: Generalized weakness    Lower Extremity Assessment Lower Extremity Assessment: Generalized weakness(L LE minimal w/bearing during gait)       Communication   Communication: No difficulties  Cognition Arousal/Alertness: Awake/alert;Lethargic Behavior During Therapy: Flat affect Overall Cognitive Status: (nt formally.  slowed processing, but functional for session)  General Comments General comments (skin integrity, edema, etc.): Not eating needs Nutrition consult.    Exercises     Assessment/Plan    PT Assessment Patient needs continued PT services  PT Problem List Decreased strength;Decreased activity tolerance;Decreased balance;Decreased mobility;Pain;Decreased knowledge of use of DME       PT Treatment Interventions Gait  training;DME instruction;Stair training;Functional mobility training;Therapeutic activities;Patient/family education    PT Goals (Current goals can be found in the Care Plan section)  Acute Rehab PT Goals Patient Stated Goal: 1003 PT Goal Formulation: With patient Time For Goal Achievement: 12/26/17 Potential to Achieve Goals: Good    Frequency Min 3X/week   Barriers to discharge        Co-evaluation               AM-PAC PT "6 Clicks" Daily Activity  Outcome Measure Difficulty turning over in bed (including adjusting bedclothes, sheets and blankets)?: A Little Difficulty moving from lying on back to sitting on the side of the bed? : A Little Difficulty sitting down on and standing up from a chair with arms (e.g., wheelchair, bedside commode, etc,.)?: Unable Help needed moving to and from a bed to chair (including a wheelchair)?: A Little Help needed walking in hospital room?: A Little Help needed climbing 3-5 steps with a railing? : A Lot 6 Click Score: 15    End of Session   Activity Tolerance: Patient tolerated treatment well;Patient limited by fatigue Patient left: in chair;with call bell/phone within reach;with chair alarm set Nurse Communication: Mobility status PT Visit Diagnosis: Unsteadiness on feet (R26.81);Muscle weakness (generalized) (M62.81);Pain Pain - Right/Left: Right Pain - part of body: Leg    Time: 1003-1039 PT Time Calculation (min) (ACUTE ONLY): 36 min   Charges:   PT Evaluation $PT Eval Moderate Complexity: 1 Mod PT Treatments $Gait Training: 8-22 mins        12/12/2017  [name], [place] [phone]  (pager) [phone]  (office)  [name] [name] 12/12/2017, 11:13 AM

## 2017-12-13 ENCOUNTER — Inpatient Hospital Stay (HOSPITAL_COMMUNITY): Payer: Medicare Other

## 2017-12-13 DIAGNOSIS — E1159 Type 2 diabetes mellitus with other circulatory complications: Secondary | ICD-10-CM

## 2017-12-13 LAB — LEGIONELLA PNEUMOPHILA SEROGP 1 UR AG: L. pneumophila Serogp 1 Ur Ag: NEGATIVE

## 2017-12-13 LAB — GLUCOSE, CAPILLARY
GLUCOSE-CAPILLARY: 199 mg/dL — AB (ref 70–99)
Glucose-Capillary: 124 mg/dL — ABNORMAL HIGH (ref 70–99)
Glucose-Capillary: 210 mg/dL — ABNORMAL HIGH (ref 70–99)
Glucose-Capillary: 184 mg/dL — ABNORMAL HIGH (ref 70–99)

## 2017-12-13 LAB — BASIC METABOLIC PANEL
Anion gap: 7 (ref 5–15)
BUN: 25 mg/dL — AB (ref 8–23)
CO2: 28 mmol/L (ref 22–32)
CREATININE: 0.67 mg/dL (ref 0.44–1.00)
Calcium: 8.9 mg/dL (ref 8.9–10.3)
Chloride: 106 mmol/L (ref 98–111)
GFR calc Af Amer: >60 mL/min (ref >60)
Glucose, Bld: 147 mg/dL — ABNORMAL HIGH (ref 70–99)
POTASSIUM: 3.7 mmol/L (ref 3.5–5.1)
SODIUM: 141 mmol/L (ref 135–145)

## 2017-12-13 MED ORDER — HYDROCODONE-ACETAMINOPHEN 10-325 MG PO TABS
1.0000 | ORAL_TABLET | Freq: Four times a day (QID) | ORAL | Status: DC | PRN
  Administered 2017-12-13: 2 via ORAL
  Filled 2017-12-13: qty 2

## 2017-12-13 MED ORDER — ALBUTEROL SULFATE HFA 108 (90 BASE) MCG/ACT IN AERS: 2.0000 | INHALATION_SPRAY | Freq: Four times a day (QID) | RESPIRATORY_TRACT | 0 refills | Status: AC | PRN

## 2017-12-13 MED ORDER — GUAIFENESIN ER 600 MG PO TB12: 600.0000 mg | ORAL_TABLET | Freq: Two times a day (BID) | ORAL | 0 refills | Status: AC

## 2017-12-13 MED ORDER — ALBUTEROL SULFATE (2.5 MG/3ML) 0.083% IN NEBU: 2.5000 mg | INHALATION_SOLUTION | Freq: Four times a day (QID) | RESPIRATORY_TRACT | 0 refills | Status: AC | PRN

## 2017-12-13 MED ORDER — FLUTICASONE-SALMETEROL 250-50 MCG/DOSE IN AEPB: 1.0000 | INHALATION_SPRAY | Freq: Two times a day (BID) | RESPIRATORY_TRACT | 0 refills | Status: AC

## 2017-12-13 MED ORDER — ALPRAZOLAM 0.5 MG PO TABS
1.0000 mg | ORAL_TABLET | Freq: Every day | ORAL | Status: DC
  Administered 2017-12-13: 1 mg via ORAL
  Filled 2017-12-13: qty 2

## 2017-12-13 MED ORDER — TIOTROPIUM BROMIDE MONOHYDRATE 18 MCG IN CAPS: 18.0000 ug | ORAL_CAPSULE | Freq: Every day | RESPIRATORY_TRACT | 0 refills | Status: AC

## 2017-12-13 MED ORDER — ALPRAZOLAM 0.5 MG PO TABS
0.5000 mg | ORAL_TABLET | Freq: Three times a day (TID) | ORAL | Status: DC
  Administered 2017-12-13 – 2017-12-14 (×4): 0.5 mg via ORAL
  Filled 2017-12-13 (×4): qty 1

## 2017-12-13 MED ORDER — GADOBUTROL 1 MMOL/ML IV SOLN
4.5000 mL | Freq: Once | INTRAVENOUS | Status: AC | PRN
  Administered 2017-12-13: 4.5 mL via INTRAVENOUS

## 2017-12-13 MED ORDER — CHLORHEXIDINE GLUCONATE CLOTH 2 % EX PADS: 6.0000 | MEDICATED_PAD | Freq: Every day | CUTANEOUS | Status: DC

## 2017-12-13 MED ORDER — GABAPENTIN 100 MG PO CAPS: 100.0000 mg | ORAL_CAPSULE | Freq: Three times a day (TID) | ORAL | Status: AC

## 2017-12-13 MED ORDER — ALPRAZOLAM 2 MG PO TABS: 1.0000 mg | ORAL_TABLET | ORAL | Status: AC

## 2017-12-13 MED ORDER — HYDROCODONE-ACETAMINOPHEN 10-325 MG PO TABS
1.0000 | ORAL_TABLET | Freq: Four times a day (QID) | ORAL | Status: DC | PRN
  Administered 2017-12-14: 1 via ORAL
  Filled 2017-12-13: qty 1

## 2017-12-13 MED ORDER — LOSARTAN POTASSIUM 50 MG PO TABS: 50.0000 mg | ORAL_TABLET | Freq: Every day | ORAL | 0 refills | Status: AC

## 2017-12-13 MED ORDER — LEVOFLOXACIN 750 MG PO TABS: 750.0000 mg | ORAL_TABLET | Freq: Every day | ORAL | 0 refills | Status: AC

## 2017-12-13 MED ORDER — ENSURE ENLIVE PO LIQD: 237.0000 mL | Freq: Two times a day (BID) | ORAL | 0 refills | Status: AC

## 2017-12-13 MED FILL — levoFLOXacin 750 MG TABS: 750 | 2 days supply | Qty: 2 | Fill #0

## 2017-12-13 MED FILL — SPIRIVA 18 MCG CP-HANDIHALE: 18 | 30 days supply | Qty: 30 | Fill #0

## 2017-12-13 MED FILL — VENTOLIN HFA 90 MCG INHALER: 108 (90 BAS | 25 days supply | Qty: 18 | Fill #0

## 2017-12-13 MED FILL — LOSARTAN POTASSIUM 50 MG TA: 50 | 30 days supply | Qty: 30 | Fill #0

## 2017-12-13 MED FILL — SYMBICORT 160-4.5 MCG INH: 160-4.5 | 30 days supply | Qty: 10 | Fill #0

## 2017-12-13 NOTE — Progress Notes (Signed)
Informed by RN that the patient now has changed her mind and wants to stay another night in the hospital.  Earlier she was insisting on being discharged home.  Continue IV levofloxacin-reassess tomorrow-hold discharge.

## 2017-12-13 NOTE — Progress Notes (Signed)
Physical Therapy Treatment Patient Details Name: [name] MRN: [id number] DOB: Apr 26, 1955 Today's Date: 12/13/2017    History of Present Illness [name] is a 62 y.o. female with history of COPD with ongoing tobacco abuse, anxiety, hypertension presents to the ER with complaints of worsening shortness of breath with productive cough for the last 3 days.  Work up includes PNA.    PT Comments    Pt's cough sounds better, but she is still needing Oxygen at all times.     Follow Up Recommendations  Home health PT;Supervision/Assistance - 24 hour     Equipment Recommendations  None recommended by PT    Recommendations for Other Services       Precautions / Restrictions Precautions Precautions: Fall    Mobility  Bed Mobility Overal bed mobility: Needs Assistance Bed Mobility: Rolling;Sidelying to Sit Rolling: Min guard Sidelying to sit: Min guard          Transfers Overall transfer level: Needs assistance Equipment used: Rolling walker (2 wheeled) Transfers: Sit to/from Stand Sit to Stand: Min assist         General transfer comment: stability assist, cues for hand placement  Ambulation/Gait Ambulation/Gait assistance: Min assist Gait Distance (Feet): 30 Feet Assistive device: Rolling walker (2 wheeled) Gait Pattern/deviations: Step-through pattern Gait velocity: slower Gait velocity interpretation: <1.31 ft/sec, indicative of household ambulator General Gait Details: generally unsteady and antalgic overall.  Quick to fatigue.  Sats dropped down to 80% on RA, back to 94% on 2L [name]   Stairs             Wheelchair Mobility    Modified Rankin (Stroke Patients Only)       Balance Overall balance assessment: Needs assistance   Sitting balance-Leahy Scale: Fair     Standing balance support: Bilateral upper extremity supported Standing balance-Leahy Scale: Poor Standing balance comment: reliant on the RW and now minimal external  assist                            Cognition Arousal/Alertness: Awake/alert;Lethargic Behavior During Therapy: Flat affect Overall Cognitive Status: Within Functional Limits for tasks assessed                                        Exercises      General Comments General comments (skin integrity, edema, etc.): Gave pt's daughter some basic parameters that are important.  Getting PT, Multiple walks a day, eating important, proteins first, protein supplements, no smoking.  Finish any abx.      Pertinent Vitals/Pain Pain Assessment: Faces Faces Pain Scale: Hurts even more Pain Location: Left leg Pain Descriptors / Indicators: Radiating Pain Intervention(s): Monitored during session    Home Living                      Prior Function            PT Goals (current goals can now be found in the care plan section) Acute Rehab PT Goals PT Goal Formulation: With patient Time For Goal Achievement: 12/26/17 Potential to Achieve Goals: Good Progress towards PT goals: Progressing toward goals    Frequency    Min 3X/week      PT Plan Current plan remains appropriate    Co-evaluation  AM-PAC PT "6 Clicks" Daily Activity  Outcome Measure  Difficulty turning over in bed (including adjusting bedclothes, sheets and blankets)?: A Little Difficulty moving from lying on back to sitting on the side of the bed? : A Little Difficulty sitting down on and standing up from a chair with arms (e.g., wheelchair, bedside commode, etc,.)?: Unable Help needed moving to and from a bed to chair (including a wheelchair)?: A Little Help needed walking in hospital room?: A Little Help needed climbing 3-5 steps with a railing? : A Little 6 Click Score: 16    End of Session Equipment Utilized During Treatment: Oxygen Activity Tolerance: Patient limited by fatigue Patient left: in bed;with call bell/phone within reach;with bed alarm set Nurse  Communication: Mobility status PT Visit Diagnosis: Unsteadiness on feet (R26.81);Muscle weakness (generalized) (M62.81);Pain Pain - Right/Left: Right Pain - part of body: Leg     Time: 3614-4315 PT Time Calculation (min) (ACUTE ONLY): 22 min  Charges:  $Gait Training: 8-22 mins                     12/13/2017  [name], [place] [phone]  (pager) [phone]  (office)   [name] 12/13/2017, 12:00 PM

## 2017-12-13 NOTE — Discharge Summary (Signed)
PATIENT DETAILS Name: [name] Age: 62 y.o. Sex: female Date of Birth: 04-05-1955 MRN: [id number]. Admitting Physician: Rise Patience, MD GBT:DVVOH, Henderson Newcomer, MD  Admit Date: 12/10/2017 Discharge date: 12/13/2017  Recommendations for Outpatient Follow-up:  1. Follow up with PCP in 1-2 weeks 2. Please obtain BMP/CBC in one week 3. Please repeat 2 view chest x-ray in 4-6 weeks. 4. Minimize narcotics and benzodiazepine use-we will need to be slowly taper down. 5. Continue counseling regarding importance of stopping tobacco use.  Admitted From:  Home  Disposition: Home with home health services    Home Health: Yes  Equipment/Devices: Resume home O2 24/7.  Discharge Condition: Stable  CODE STATUS: FULL CODE  Diet recommendation:  Heart Healthy / Carb Modified   Brief Summary: See H&P, Labs, Consult and Test reports for all details in brief, Patient is a 62 y.o. female with history of COPD, hypertension, anxiety, tobacco use presented with cough, shortness of breath for 3 days prior to this hospital stay, found to have pneumonia on imaging.  Started on empiric levofloxacin and admitted to the hospitalist service.  See below for further details  Brief Hospital Course: Community-acquired pneumonia: Overall much improved on the day of discharge-she does not wish to remain hospitalized any longer-and is asking to be discharged home today.  Since overall improved-and has probable significant amount of debility/deconditioning at baseline-stable to be discharged home at home request today.  I did ask her to stay 1 more night but she flatly refused.  Change IV levofloxacin to oral route-continue with Mucinex-and follow-up with PCP in 1 week.  Needs a repeat 2 view chest x-ray in 4-6 weeks to document resolution of pneumonia as the patient has a long-standing history of tobacco use.   Mild dysarthria: Seen on 10/31-MRI brain negative-no focal neurological deficits.   Apparently this is been going on for 1 week-she is without her dentures-and apparently gets somewhat lethargic and dysarthric after taking Xanax.  She has not been on chronic benzodiazepines and narcotics-it is probably a good idea to start decreasing/titrating down these medications in the outpatient setting.  Defer to PCP.  Since MRI negative-doubt further work-up required.  COPD on home O2: Lungs are clear without any evidence of exacerbation.  Apparently noncompliant with inhalers at home-is on 2 L of oxygen but mostly at night.    Will start Spiriva, Advair and as needed albuterol on discharge.  Counseled extensively regarding importance of stopping tobacco use  Hypertension: Controlled-continue losartan  DM-2: CBG stable with SSI-resume oral hypoglycemics on discharge.  Anxiety: Slightly more anxious than usual due to acute illness-continue Xanax.  PCP to consider gradual decrease in doses of Xanax as she is on narcotics as well.  Chronic pain syndrome: Continue usual narcotic regimen and discharge-explained that it is probably a good idea to taper down both narcotics and benzodiazepines.  Patient aware of the risk of polypharmacy.  Hypokalemia: Repleted-recheck periodically  Tobacco abuse: Not sure if she has any intention of quitting at this point.    Counseled  Severe malnutrition: Continue supplements  Procedures/Studies: None  Discharge Diagnoses:  Principal Problem:   CAP (community acquired pneumonia) Active Problems:   COPD GOLD II/ still smoking    Chronic diastolic CHF (congestive heart failure) (HCC)   PAF (paroxysmal atrial fibrillation) (HCC)   Type 2 diabetes mellitus with vascular disease ([place])   Essential hypertension   Pneumonia   Discharge Instructions:  Activity:  As tolerated   Discharge Instructions  Call MD for:  difficulty breathing, headache or visual disturbances   Complete by:  As directed    DME Nebulizer machine   Complete by:  As  directed    Patient needs a nebulizer to treat with the following condition:  COPD (chronic obstructive pulmonary disease) (HCC)   Diet - low sodium heart healthy   Complete by:  As directed    Discharge instructions   Complete by:  As directed    You had Pneumonia: Please ask your primary care practitioner to get a 2 view Chest X ray done in 4-6 weeks after hospital discharge   Follow with Primary MD  Elwyn Reach, MD in 1 week  Please get a complete blood count and chemistry panel checked by your Primary MD at your next visit, and again as instructed by your Primary MD.  Get Medicines reviewed and adjusted: Please take all your medications with you for your next visit with your Primary MD  Laboratory/radiological data: Please request your Primary MD to go over all hospital tests and procedure/radiological results at the follow up, please ask your Primary MD to get all Hospital records sent to his/her office.  In some cases, they will be blood work, cultures and biopsy results pending at the time of your discharge. Please request that your primary care M.D. follows up on these results.  Also Note the following: If you experience worsening of your admission symptoms, develop shortness of breath, life threatening emergency, suicidal or homicidal thoughts you must seek medical attention immediately by calling 911 or calling your MD immediately  if symptoms less severe.  You must read complete instructions/literature along with all the possible adverse reactions/side effects for all the Medicines you take and that have been prescribed to you. Take any new Medicines after you have completely understood and accpet all the possible adverse reactions/side effects.   Do not drive when taking Pain medications or sleeping medications (Benzodaizepines)  Do not take more than prescribed Pain, Sleep and Anxiety Medications. It is not advisable to combine anxiety,sleep and pain medications  without talking with your primary care practitioner  Special Instructions: If you have smoked or chewed Tobacco  in the last 2 yrs please stop smoking, stop any regular Alcohol  and or any Recreational drug use.  Wear Seat belts while driving.  Please note: You were cared for by a hospitalist during your hospital stay. Once you are discharged, your primary care physician will handle any further medical issues. Please note that NO REFILLS for any discharge medications will be authorized once you are discharged, as it is imperative that you return to your primary care physician (or establish a relationship with a primary care physician if you do not have one) for your post hospital discharge needs so that they can reassess your need for medications and monitor your lab values.   Increase activity slowly   Complete by:  As directed      Allergies as of 12/13/2017      Reactions   Contrast Media [iodinated Diagnostic Agents] Nausea And Vomiting   Doxycycline Nausea And Vomiting   Erythromycin Nausea And Vomiting   Penicillins Itching, Rash   Has patient had a PCN reaction causing immediate rash, facial/tongue/throat swelling, SOB or lightheadedness with hypotension: YES Has patient had a PCN reaction causing severe rash involving mucus membranes or skin necrosis: NO Has patient had a PCN reaction that required hospitalization: NO Has patient had a PCN reaction occurring within the last  10 years: Unknown If all of the above answers are "NO", then may proceed with Cephalosporin use. * has tolerated Cephalosporins   Amoxicillin Nausea And Vomiting, Swelling   Has patient had a PCN reaction causing immediate rash, facial/tongue/throat swelling, SOB or lightheadedness with hypotension: No Has patient had a PCN reaction causing severe rash involving mucus membranes or skin necrosis: No Has patient had a PCN reaction that required hospitalization: No Has patient had a PCN reaction occurring within  the last 10 years: Yes If all of the above answers are "NO", then may proceed with C   Metronidazole Hives, Rash      Medication List    STOP taking these medications   clonazePAM 0.5 MG tablet Commonly known as:  KLONOPIN   ibuprofen 800 MG tablet Commonly known as:  ADVIL,MOTRIN   losartan-hydrochlorothiazide 50-12.5 MG tablet Commonly known as:  HYZAAR   metoprolol succinate 100 MG 24 hr tablet Commonly known as:  TOPROL-XL     TAKE these medications   albuterol 108 (90 Base) MCG/ACT inhaler Commonly known as:  PROVENTIL HFA;VENTOLIN HFA Inhale 2 puffs into the lungs every 6 (six) hours as needed for wheezing or shortness of breath.   albuterol (2.5 MG/3ML) 0.083% nebulizer solution Commonly known as:  PROVENTIL Take 3 mLs (2.5 mg total) by nebulization every 6 (six) hours as needed for wheezing or shortness of breath.   alprazolam 2 MG tablet Commonly known as:  XANAX Take 0.5-1 tablets (1-2 mg total) by mouth See admin instructions. 1 mg by mouth in the morning then 1 mg at noon(time) then 1 mg at 4 PM then 2 mg at bedtime   feeding supplement (ENSURE ENLIVE) Liqd Take 237 mLs by mouth 2 (two) times daily between meals.   Fluticasone-Salmeterol 250-50 MCG/DOSE Aepb Commonly known as:  ADVAIR Inhale 1 puff into the lungs 2 (two) times daily.   gabapentin 100 MG capsule Commonly known as:  NEURONTIN Take 1 capsule (100 mg total) by mouth 3 (three) times daily.   glipiZIDE 10 MG tablet Commonly known as:  GLUCOTROL Take 10 mg by mouth 2 (two) times daily.   guaiFENesin 600 MG 12 hr tablet Commonly known as:  MUCINEX Take 1 tablet (600 mg total) by mouth 2 (two) times daily.   HYDROcodone-acetaminophen 10-325 MG tablet Commonly known as:  NORCO Take 1-2 tablets by mouth every 4 (four) hours as needed for moderate pain.   levofloxacin 750 MG tablet Commonly known as:  LEVAQUIN Take 1 tablet (750 mg total) by mouth daily.   losartan 50 MG tablet Commonly  known as:  COZAAR Take 1 tablet (50 mg total) by mouth daily. Start taking on:  12/14/2017   metFORMIN 1000 MG tablet Commonly known as:  GLUCOPHAGE Take 1,000 mg by mouth 2 (two) times daily with a meal.   OXYGEN Inhale 2 L into the lungs at bedtime.   tiotropium 18 MCG inhalation capsule Commonly known as:  SPIRIVA Place 1 capsule (18 mcg total) into inhaler and inhale daily.            Durable Medical Equipment  (From admission, onward)         Start     Ordered   12/13/17 0000  DME Nebulizer machine    Question:  Patient needs a nebulizer to treat with the following condition  Answer:  COPD (chronic obstructive pulmonary disease) ([place])   12/13/17 1141         Follow-up Information  Elwyn Reach, MD. Schedule an appointment as soon as possible for a visit in 1 week(s).   Specialty:  Internal Medicine Contact information: [address] [phone]          Allergies  Allergen Reactions  . Contrast Media [Iodinated Diagnostic Agents] Nausea And Vomiting  . Doxycycline Nausea And Vomiting  . Erythromycin Nausea And Vomiting  . Penicillins Itching and Rash    Has patient had a PCN reaction causing immediate rash, facial/tongue/throat swelling, SOB or lightheadedness with hypotension: YES Has patient had a PCN reaction causing severe rash involving mucus membranes or skin necrosis: NO Has patient had a PCN reaction that required hospitalization: NO Has patient had a PCN reaction occurring within the last 10 years: Unknown If all of the above answers are "NO", then may proceed with Cephalosporin use. * has tolerated Cephalosporins  . Amoxicillin Nausea And Vomiting and Swelling     Has patient had a PCN reaction causing immediate rash, facial/tongue/throat swelling, SOB or lightheadedness with hypotension: No Has patient had a PCN reaction causing severe rash involving mucus membranes or skin necrosis: No Has patient had a  PCN reaction that required hospitalization: No Has patient had a PCN reaction occurring within the last 10 years: Yes If all of the above answers are "NO", then may proceed with C  . Metronidazole Hives and Rash    Consultations:   None  Other Procedures/Studies: Dg Chest 2 View  Result Date: 12/10/2017 CLINICAL DATA:  Weakness 3 days.  Altered mental status. EXAM: CHEST - 2 VIEW COMPARISON:  05/24/2017 FINDINGS: Patient is slightly rotated to the right. Lungs are adequately inflated demonstrate airspace process over the right middle and lower lobe likely pneumonia. No significant effusion. Left lung is clear. Cardiomediastinal silhouette and remainder of the exam is unchanged. IMPRESSION: Patchy airspace process over the right middle and lower lobe likely pneumonia. Electronically Signed   By: Marin Olp M.D.   On: 12/10/2017 21:05   Ct Chest Wo Contrast  Result Date: 12/11/2017 CLINICAL DATA:  Cough, congestion. EXAM: CT CHEST WITHOUT CONTRAST TECHNIQUE: Multidetector CT imaging of the chest was performed following the standard protocol without IV contrast. COMPARISON:  Radiographs of December 10, 2017. FINDINGS: Cardiovascular: Atherosclerosis of thoracic aorta is noted without aneurysm formation. Normal cardiac size. No pericardial effusion. Coronary artery calcifications are noted. Mediastinum/Nodes: No enlarged mediastinal or axillary lymph nodes. Thyroid gland, trachea, and esophagus demonstrate no significant findings. Lungs/Pleura: No pneumothorax or pleural effusion is noted. Emphysematous disease is noted in both upper lobes. Right lower lobe airspace opacity is noted most consistent with pneumonia. Upper Abdomen: No acute abnormality. Musculoskeletal: No chest wall mass or suspicious bone lesions identified. IMPRESSION: Right lower lobe airspace opacity most consistent with pneumonia. Followup PA and lateral chest X-ray is recommended in 3-4 weeks following trial of antibiotic  therapy to ensure resolution and exclude underlying malignancy. Coronary artery calcifications are noted consistent with coronary artery disease. Aortic Atherosclerosis (ICD10-I70.0) and Emphysema (ICD10-J43.9). Electronically Signed   By: Marijo Conception, M.D.   On: 12/11/2017 11:50   Mr Jeri Cos UV Contrast  Result Date: 12/13/2017 CLINICAL DATA:  Speech difficulty.  Rule out stroke EXAM: MRI HEAD WITHOUT AND WITH CONTRAST TECHNIQUE: Multiplanar, multiecho pulse sequences of the brain and surrounding structures were obtained without and with intravenous contrast. CONTRAST:  4.5 mL Gadovist IV COMPARISON:  CT head 05/16/2017 FINDINGS: Brain: Negative for acute infarct. Chronic ischemic changes above. Small chronic  infarct right occipital lobe. 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 Negative for hemorrhage or mass. Negative for hydrocephalus. Vascular: Normal arterial flow voids Skull and upper cervical spine: Negative Sinuses/Orbits: Negative Other: None IMPRESSION: No acute abnormality.  Chronic microvascular Electronically Signed   By: Franchot Gallo M.D.   On: 12/13/2017 10:11      TODAY-DAY OF DISCHARGE:  Subjective:   [name] today has no headache,no chest abdominal pain,no new weakness tingling or numbness, feels much better wants to go home today.  Does not wishe to remain inpatient any longer.  Objective:   Blood pressure 106/70, pulse 73, temperature 98.6 F (37 C), temperature source Oral, resp. rate 18, height 5\' 5"  (1.651 m), weight 47.6 kg, SpO2 96 %.  Intake/Output Summary (Last 24 hours) at 12/13/2017 1142 Last data filed at 12/13/2017 0600 Gross per 24 hour  Intake 332.18 ml  Output -  Net 332.18 ml   Filed Weights   12/11/17 0500 12/12/17 0500 12/13/17 0423  Weight: 49.2 kg 48.9 kg 47.6 kg    Exam: Awake Alert, Oriented *3, No new F.N deficits, Normal affect [place].AT,PERRAL Supple Neck,No JVD, No cervical lymphadenopathy appriciated.    Symmetrical Chest wall movement, Good air movement bilaterally, CTAB RRR,No Gallops,Rubs or new Murmurs, No Parasternal Heave +ve B.Sounds, Abd Soft, Non tender, No organomegaly appriciated, No rebound -guarding or rigidity. No Cyanosis, Clubbing or edema, No new Rash or bruise   PERTINENT RADIOLOGIC STUDIES: Dg Chest 2 View  Result Date: 12/10/2017 CLINICAL DATA:  Weakness 3 days.  Altered mental status. EXAM: CHEST - 2 VIEW COMPARISON:  05/24/2017 FINDINGS: Patient is slightly rotated to the right. Lungs are adequately inflated demonstrate airspace process over the right middle and lower lobe likely pneumonia. No significant effusion. Left lung is clear. Cardiomediastinal silhouette and remainder of the exam is unchanged. IMPRESSION: Patchy airspace process over the right middle and lower lobe likely pneumonia. Electronically Signed   By: Marin Olp M.D.   On: 12/10/2017 21:05   Ct Chest Wo Contrast  Result Date: 12/11/2017 CLINICAL DATA:  Cough, congestion. EXAM: CT CHEST WITHOUT CONTRAST TECHNIQUE: Multidetector CT imaging of the chest was performed following the standard protocol without IV contrast. COMPARISON:  Radiographs of December 10, 2017. FINDINGS: Cardiovascular: Atherosclerosis of thoracic aorta is noted without aneurysm formation. Normal cardiac size. No pericardial effusion. Coronary artery calcifications are noted. Mediastinum/Nodes: No enlarged mediastinal or axillary lymph nodes. Thyroid gland, trachea, and esophagus demonstrate no significant findings. Lungs/Pleura: No pneumothorax or pleural effusion is noted. Emphysematous disease is noted in both upper lobes. Right lower lobe airspace opacity is noted most consistent with pneumonia. Upper Abdomen: No acute abnormality. Musculoskeletal: No chest wall mass or suspicious bone lesions identified. IMPRESSION: Right lower lobe airspace opacity most consistent with pneumonia. Followup PA and lateral chest X-ray is recommended in  3-4 weeks following trial of antibiotic therapy to ensure resolution and exclude underlying malignancy. Coronary artery calcifications are noted consistent with coronary artery disease. Aortic Atherosclerosis (ICD10-I70.0) and Emphysema (ICD10-J43.9). Electronically Signed   By: Marijo Conception, M.D.   On: 12/11/2017 11:50   Mr Jeri Cos HE Contrast  Result Date: 12/13/2017 CLINICAL DATA:  Speech difficulty.  Rule out stroke EXAM: MRI HEAD WITHOUT AND WITH CONTRAST TECHNIQUE: Multiplanar, multiecho pulse sequences of the brain and surrounding structures were obtained without and with intravenous contrast. CONTRAST:  4.5 mL Gadovist IV COMPARISON:  CT head 05/16/2017 FINDINGS: Brain: Negative for acute infarct. Chronic ischemic changes above. Small  chronic infarct right occipital lobe. 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 Negative for hemorrhage or mass. Negative for hydrocephalus. Vascular: Normal arterial flow voids Skull and upper cervical spine: Negative Sinuses/Orbits: Negative Other: None IMPRESSION: No acute abnormality.  Chronic microvascular Electronically Signed   By: Franchot Gallo M.D.   On: 12/13/2017 10:11     PERTINENT LAB RESULTS: CBC: Recent Labs    12/11/17 0711 12/12/17 0354  WBC 10.2 11.4*  HGB 11.4* 10.4*  HCT 37.6 34.3*  PLT 336 311   CMET CMP     Component Value Date/Time   NA 141 12/13/2017 0351   K 3.7 12/13/2017 0351   CL 106 12/13/2017 0351   CO2 28 12/13/2017 0351   GLUCOSE 147 (H) 12/13/2017 0351   BUN 25 (H) 12/13/2017 0351   CREATININE 0.67 12/13/2017 0351   CALCIUM 8.9 12/13/2017 0351   PROT 6.6 08/31/2017 0613   ALBUMIN 3.3 (L) 08/31/2017 0613   AST 13 (L) 08/31/2017 0613   ALT 10 08/31/2017 0613   ALKPHOS 60 08/31/2017 0613   BILITOT 0.4 08/31/2017 0613   GFRNONAA >60 12/13/2017 0351   GFRAA >60 12/13/2017 0351    GFR Estimated Creatinine Clearance: 54.1 mL/min (by C-G formula based on SCr of 0.67 mg/dL). No results for  input(s): LIPASE, AMYLASE in the last 72 hours. No results for input(s): CKTOTAL, CKMB, CKMBINDEX, TROPONINI in the last 72 hours. Invalid input(s): POCBNP No results for input(s): DDIMER in the last 72 hours. No results for input(s): HGBA1C in the last 72 hours. No results for input(s): CHOL, HDL, LDLCALC, TRIG, CHOLHDL, LDLDIRECT in the last 72 hours. No results for input(s): TSH, T4TOTAL, T3FREE, THYROIDAB in the last 72 hours.  Invalid input(s): FREET3 No results for input(s): VITAMINB12, FOLATE, FERRITIN, TIBC, IRON, RETICCTPCT in the last 72 hours. Coags: No results for input(s): INR in the last 72 hours.  Invalid input(s): PT Microbiology: Recent Results (from the past 240 hour(s))  Culture, sputum-assessment     Status: None   Collection Time: 12/12/17  6:53 AM  Result Value Ref Range Status   Specimen Description SPUTUM  Final   Special Requests NONE  Final   Sputum evaluation   Final    THIS SPECIMEN IS ACCEPTABLE FOR SPUTUM CULTURE Performed at [facility], [place] [address] [place], [place] [address]    Report Status 12/12/2017 FINAL  Final  Culture, respiratory     Status: None (Preliminary result)   Collection Time: 12/12/17  6:53 AM  Result Value Ref Range Status   Specimen Description SPUTUM  Final   Special Requests NONE Reflexed from [id number]  Final   Gram Stain   Final    ABUNDANT WBC PRESENT, PREDOMINANTLY PMN FEW GRAM POSITIVE COCCI FEW GRAM POSITIVE RODS RARE GRAM NEGATIVE RODS    Culture   Final    CULTURE REINCUBATED FOR BETTER GROWTH Performed at [facility], [place] [address], [place],  [address]    Report Status PENDING  Incomplete    FURTHER DISCHARGE INSTRUCTIONS:  Get Medicines reviewed and adjusted: Please take all your medications with you for your next visit with your Primary MD  Laboratory/radiological data: Please request your Primary MD to go over all hospital tests and procedure/radiological results at the follow up,  please ask your Primary MD to get all Hospital records sent to his/her office.  In some cases, they will be blood work, cultures and biopsy results pending at the time of your  discharge. Please request that your primary care M.D. goes through all the records of your hospital data and follows up on these results.  Also Note the following: If you experience worsening of your admission symptoms, develop shortness of breath, life threatening emergency, suicidal or homicidal thoughts you must seek medical attention immediately by calling 911 or calling your MD immediately  if symptoms less severe.  You must read complete instructions/literature along with all the possible adverse reactions/side effects for all the Medicines you take and that have been prescribed to you. Take any new Medicines after you have completely understood and accpet all the possible adverse reactions/side effects.   Do not drive when taking Pain medications or sleeping medications (Benzodaizepines)  Do not take more than prescribed Pain, Sleep and Anxiety Medications. It is not advisable to combine anxiety,sleep and pain medications without talking with your primary care practitioner  Special Instructions: If you have smoked or chewed Tobacco  in the last 2 yrs please stop smoking, stop any regular Alcohol  and or any Recreational drug use.  Wear Seat belts while driving.  Please note: You were cared for by a hospitalist during your hospital stay. Once you are discharged, your primary care physician will handle any further medical issues. Please note that NO REFILLS for any discharge medications will be authorized once you are discharged, as it is imperative that you return to your primary care physician (or establish a relationship with a primary care physician if you do not have one) for your post hospital discharge needs so that they can reassess your need for medications and monitor your lab values.  Total Time spent  coordinating discharge including counseling, education and face to face time equals  45 minutes.  SignedOren Binet 12/13/2017 11:42 AM

## 2017-12-13 NOTE — Care Management Note (Signed)
Case Management Note  Patient Details  Name: AIDAH FORQUER MRN: [id number] Date of Birth: [birth year]  Subjective/Objective:   CAP/ COPD/HTN  Lalitha Ilyas ([address] Rachell Cipro ([phone]       Action/Plan: Transition to home today with the resumption of home of home health services. Pt states daughter will ensure has 24/7 supervision and assistance once d/c.  Daughter will provide transportation to home.  Expected Discharge Date:  12/13/17               Expected Discharge Plan:  [place]  In-House Referral:  NA  Discharge planning Services  CM Consult  Post Acute Care Choice:  Home Health, Resumption of Svcs/PTA Provider Choice offered to:  Patient  DME Arranged:  N/A DME Agency:  NA  HH Arranged:  RN, PT [place] Agency:  [facility] (formerly [employer])  Status of Service:  Completed, signed off  If discussed at [employer], dates discussed:    Additional Comments:  Sharin Mons, RN 12/13/2017, 2:05 PM

## 2017-12-14 LAB — CULTURE, RESPIRATORY W GRAM STAIN

## 2017-12-14 LAB — GLUCOSE, CAPILLARY
GLUCOSE-CAPILLARY: 184 mg/dL — AB (ref 70–99)
GLUCOSE-CAPILLARY: 193 mg/dL — AB (ref 70–99)
GLUCOSE-CAPILLARY: 161 mg/dL — AB (ref 70–99)

## 2017-12-14 LAB — CULTURE, RESPIRATORY: CULTURE: NORMAL

## 2017-12-14 NOTE — Progress Notes (Signed)
Physical Therapy Treatment Patient Details Name: [name] MRN: [id number] DOB: [birth year] Today's Date: 12/14/2017    History of Present Illness [name] is a 62 y.o. female with history of COPD with ongoing tobacco abuse, anxiety, hypertension presents to the ER with complaints of worsening shortness of breath with productive cough for the last 3 days.  Work up includes PNA.    PT Comments    Patient's tolerance to treatment today was fair.  Patient was transported from room to stairs in recliner chair due to poor strength, endurance, and activity tolerance.  Patient required verbal cuing and HHA from therapist to safely ascend and descend stairs today with 2L/min oxygen (nasal cannula).  Patient stated she was fatigued at the end of treatment and was returned to her room.  Patient continues to benefit from acute care PT prior to recommended home health PT.  Plan for d/c home today per chart.  Follow Up Recommendations  Home health PT;Supervision/Assistance - 24 hour     Equipment Recommendations  None recommended by PT    Recommendations for Other Services       Precautions / Restrictions Precautions Precautions: Fall Restrictions Weight Bearing Restrictions: No    Mobility  Bed Mobility              General bed mobility comments: Pt seated on EOB upon arrival  Transfers Overall transfer level: Needs assistance Equipment used: Rolling walker (2 wheeled) Transfers: Sit to/from Stand Sit to Stand: Min assist         General transfer comment: min VC for hand and foot placement. min assist to scoot forward in chair prior to standing.  Ambulation/Gait Ambulation/Gait assistance: Min assist Gait Distance (Feet): 4 Feet Assistive device: Rolling walker (2 wheeled)   Gait velocity: decreased   General Gait Details: pt completed steps to chair from her bed as well as steps to and from the stairs. pt required min assist for safety while turning and  backing up to recliner chair with RW.   Stairs Stairs: Yes Stairs assistance: Min assist Stair Management: Forwards;One rail Right Number of Stairs: 2 General stair comments: Therapist provided HHA on left side.  Patient reported she has a column she can hold on the right.  Right rail in stairwell was used during session to simulate.   Wheelchair Mobility    Modified Rankin (Stroke Patients Only)       Balance Overall balance assessment: Needs assistance Sitting-balance support: Feet supported Sitting balance-Leahy Scale: Fair     Standing balance support: Bilateral upper extremity supported Standing balance-Leahy Scale: Poor Standing balance comment: pt remains reliant on RW and min assist for safety                            Cognition Arousal/Alertness: Awake/alert Behavior During Therapy: Flat affect Overall Cognitive Status: Within Functional Limits for tasks assessed                                        Exercises      General Comments        Pertinent Vitals/Pain Pain Assessment: 0-10 Pain Score: 5  Pain Location: Left leg and back Pain Descriptors / Indicators: Grimacing Pain Intervention(s): Monitored during session    Home Living  Prior Function            PT Goals (current goals can now be found in the care plan section) Acute Rehab PT Goals Patient Stated Goal: pt stated she wants to go home PT Goal Formulation: With patient Time For Goal Achievement: 12/26/17 Potential to Achieve Goals: Good Progress towards PT goals: Progressing toward goals    Frequency    Min 3X/week      PT Plan Current plan remains appropriate    Co-evaluation              AM-PAC PT "6 Clicks" Daily Activity  Outcome Measure  Difficulty turning over in bed (including adjusting bedclothes, sheets and blankets)?: A Little Difficulty moving from lying on back to sitting on the side of the bed?  : A Little Difficulty sitting down on and standing up from a chair with arms (e.g., wheelchair, bedside commode, etc,.)?: Unable Help needed moving to and from a bed to chair (including a wheelchair)?: A Little Help needed walking in hospital room?: A Little Help needed climbing 3-5 steps with a railing? : A Little 6 Click Score: 16    End of Session Equipment Utilized During Treatment: Gait belt;Oxygen Activity Tolerance: Patient tolerated treatment well;Patient limited by fatigue Patient left: in chair;with call bell/phone within reach;with chair alarm set;with nursing/sitter in room Nurse Communication: Mobility status PT Visit Diagnosis: Unsteadiness on feet (R26.81);Muscle weakness (generalized) (M62.81);Pain Pain - Right/Left: Left Pain - part of body: Leg     Time: 4696-2952 PT Time Calculation (min) (ACUTE ONLY): 20 min  Charges:  $Gait Training: 8-22 mins                     [address], SPTA   Tyleek Smick 12/14/2017, 2:21 PM

## 2017-12-14 NOTE — Progress Notes (Signed)
Seen and examined-no major issues overnight per RN.  Speech is much clearer today-she does not have her dentures and appears to have some mild dysarthria at baseline.  Per patient this is her usual baseline.  No focal deficits.  Lungs are actually clear on exam-she has chronic debility/deconditioning-worsen by pneumonia.  Physical therapy has seen this patient and plans are for home health services which have been ordered.  She is stable to be discharged.  See discharge summary done yesterday.

## 2017-12-14 NOTE — Progress Notes (Signed)
Discharge information given to patient and daughter at the bedside. Patient dressed and belongings taken by daughter. Iv removed without complication. Umbilical wound dressing changed. Encourage smoking cessation and decreased use of pain medications. Patient being transported by volunteer to emergency room exit and daughter driving patient home.

## 2017-12-14 NOTE — Care Management Note (Signed)
Case Management Note  Patient Details  Name: [name] MRN: [id number] Date of Birth: 01-25-1956  Subjective/Objective:       CAP. Resides with daughter. DME: home oxygen, PTA provided by [facility].            Action/Plan: Transition to home with home health services to resumption. Pt states daughter to provide transportation to home. Daughter to bring portable oxygen tank for transportation to home if needed.   Expected Discharge Date:  12/14/17               Expected Discharge Plan:  [place]  In-House Referral:  NA  Discharge planning Services  CM Consult  Post Acute Care Choice:  Home Health, Resumption of Svcs/PTA Provider Choice offered to:  Patient  DME Arranged:  N/A DME Agency:  NA  HH Arranged:  RN, PT [place] Agency:  [facility] ([facility] [employer])  Status of Service:  Completed, signed off  If discussed at [employer], dates discussed:    Additional Comments:  Sharin Mons, RN 12/14/2017, 11:35 AM

## 2018-02-03 ENCOUNTER — Encounter (HOSPITAL_COMMUNITY): Payer: Self-pay

## 2018-02-03 ENCOUNTER — Emergency Department (HOSPITAL_COMMUNITY)
Admission: EM | Admit: 2018-02-03 | Discharge: 2018-02-03 | Disposition: A | Discharge status: Home / Self Care | Payer: Medicare Other | Attending: Emergency Medicine | Admitting: Emergency Medicine

## 2018-02-03 ENCOUNTER — Emergency Department (HOSPITAL_COMMUNITY): Payer: Medicare Other

## 2018-02-03 ENCOUNTER — Other Ambulatory Visit: Payer: Self-pay

## 2018-02-03 DIAGNOSIS — Y69 Unspecified misadventure during surgical and medical care: Secondary | ICD-10-CM | POA: Insufficient documentation

## 2018-02-03 DIAGNOSIS — J449 Chronic obstructive pulmonary disease, unspecified: Secondary | ICD-10-CM | POA: Insufficient documentation

## 2018-02-03 DIAGNOSIS — I5032 Chronic diastolic (congestive) heart failure: Secondary | ICD-10-CM | POA: Insufficient documentation

## 2018-02-03 DIAGNOSIS — F17210 Nicotine dependence, cigarettes, uncomplicated: Secondary | ICD-10-CM | POA: Diagnosis not present

## 2018-02-03 DIAGNOSIS — J45909 Unspecified asthma, uncomplicated: Secondary | ICD-10-CM | POA: Insufficient documentation

## 2018-02-03 DIAGNOSIS — K439 Ventral hernia without obstruction or gangrene: Secondary | ICD-10-CM | POA: Diagnosis not present

## 2018-02-03 DIAGNOSIS — E119 Type 2 diabetes mellitus without complications: Secondary | ICD-10-CM | POA: Diagnosis not present

## 2018-02-03 DIAGNOSIS — T8189XA Other complications of procedures, not elsewhere classified, initial encounter: Secondary | ICD-10-CM | POA: Insufficient documentation

## 2018-02-03 DIAGNOSIS — Z85828 Personal history of other malignant neoplasm of skin: Secondary | ICD-10-CM | POA: Insufficient documentation

## 2018-02-03 DIAGNOSIS — I110 Hypertensive heart disease with heart failure: Secondary | ICD-10-CM | POA: Insufficient documentation

## 2018-02-03 DIAGNOSIS — R05 Cough: Secondary | ICD-10-CM | POA: Diagnosis not present

## 2018-02-03 DIAGNOSIS — Z7984 Long term (current) use of oral hypoglycemic drugs: Secondary | ICD-10-CM | POA: Insufficient documentation

## 2018-02-03 DIAGNOSIS — R1907 Generalized intra-abdominal and pelvic swelling, mass and lump: Secondary | ICD-10-CM | POA: Insufficient documentation

## 2018-02-03 DIAGNOSIS — Z8541 Personal history of malignant neoplasm of cervix uteri: Secondary | ICD-10-CM | POA: Insufficient documentation

## 2018-02-03 DIAGNOSIS — Z5189 Encounter for other specified aftercare: Secondary | ICD-10-CM

## 2018-02-03 LAB — COMPREHENSIVE METABOLIC PANEL
ALBUMIN: 3.2 g/dL — AB (ref 3.5–5.0)
ALT: 23 U/L (ref 0–44)
AST: 23 U/L (ref 15–41)
Alkaline Phosphatase: 48 U/L (ref 38–126)
Anion gap: 11 (ref 5–15)
BUN: 27 mg/dL — AB (ref 8–23)
CO2: 28 mmol/L (ref 22–32)
Calcium: 9.3 mg/dL (ref 8.9–10.3)
Chloride: 103 mmol/L (ref 98–111)
Creatinine, Ser: 0.87 mg/dL (ref 0.44–1.00)
GFR calc Af Amer: >60 mL/min (ref >60)
Glucose, Bld: 108 mg/dL — ABNORMAL HIGH (ref 70–99)
POTASSIUM: 3 mmol/L — AB (ref 3.5–5.1)
Sodium: 142 mmol/L (ref 135–145)
Total Bilirubin: 0.5 mg/dL (ref 0.3–1.2)
Total Protein: 7.1 g/dL (ref 6.5–8.1)

## 2018-02-03 LAB — CBC WITH DIFFERENTIAL/PLATELET
ABS IMMATURE GRANULOCYTES: 0.04 10*3/uL (ref 0.00–0.07)
BASOS ABS: 0 10*3/uL (ref 0.0–0.1)
BASOS PCT: 0 %
Eosinophils Absolute: 0.1 10*3/uL (ref 0.0–0.5)
Eosinophils Relative: 1 %
HCT: 43.6 % (ref 36.0–46.0)
HEMOGLOBIN: 13.9 g/dL (ref 12.0–15.0)
IMMATURE GRANULOCYTES: 0 %
Lymphocytes Relative: 28 %
Lymphs Abs: 2.5 10*3/uL (ref 0.7–4.0)
MCH: 29 pg (ref 26.0–34.0)
MCHC: 31.9 g/dL (ref 30.0–36.0)
MCV: 90.8 fL (ref 80.0–100.0)
MONO ABS: 0.5 10*3/uL (ref 0.1–1.0)
Monocytes Relative: 6 %
NEUTROS ABS: 5.8 10*3/uL (ref 1.7–7.7)
NEUTROS PCT: 65 %
PLATELETS: 170 10*3/uL (ref 150–400)
RBC: 4.8 MIL/uL (ref 3.87–5.11)
RDW: 13.6 % (ref 11.5–15.5)
WBC: 9 10*3/uL (ref 4.0–10.5)
nRBC: 0 % (ref 0.0–0.2)

## 2018-02-03 LAB — I-STAT VENOUS BLOOD GAS, ED
ACID-BASE EXCESS: 3 mmol/L — AB (ref 0.0–2.0)
BICARBONATE: 29.3 mmol/L — AB (ref 20.0–28.0)
O2 Saturation: 22 %
PH VEN: 7.368 (ref 7.250–7.430)
PO2 VEN: 17 mmHg — AB (ref 32.0–45.0)
TCO2: 31 mmol/L (ref 22–32)
pCO2, Ven: 50.9 mmHg (ref 44.0–60.0)

## 2018-02-03 LAB — URINALYSIS, ROUTINE W REFLEX MICROSCOPIC
Glucose, UA: NEGATIVE mg/dL
Hgb urine dipstick: NEGATIVE
Ketones, ur: NEGATIVE mg/dL
LEUKOCYTES UA: NEGATIVE
NITRITE: NEGATIVE
PROTEIN: NEGATIVE mg/dL
pH: 6.5 (ref 5.0–8.0)

## 2018-02-03 MED ORDER — SODIUM CHLORIDE 0.9 % IV SOLN: INTRAVENOUS | Status: DC

## 2018-02-03 MED ORDER — ACETAMINOPHEN 325 MG PO TABS
650.0000 mg | ORAL_TABLET | Freq: Once | ORAL | Status: AC
  Administered 2018-02-03: 650 mg via ORAL
  Filled 2018-02-03: qty 2

## 2018-02-03 MED ORDER — IOHEXOL 300 MG/ML  SOLN
100.0000 mL | Freq: Once | INTRAMUSCULAR | Status: AC | PRN
  Administered 2018-02-03: 100 mL via INTRAVENOUS

## 2018-02-03 MED ORDER — POTASSIUM CHLORIDE CRYS ER 20 MEQ PO TBCR
40.0000 meq | EXTENDED_RELEASE_TABLET | Freq: Once | ORAL | Status: AC
  Administered 2018-02-03: 40 meq via ORAL
  Filled 2018-02-03: qty 2

## 2018-02-03 MED ORDER — SODIUM CHLORIDE 0.9 % IV BOLUS
500.0000 mL | Freq: Once | INTRAVENOUS | Status: AC
  Administered 2018-02-03: 500 mL via INTRAVENOUS

## 2018-02-03 NOTE — ED Provider Notes (Signed)
[facility] EMERGENCY DEPARTMENT Provider Note   CSN: [id number] Arrival date & time: 02/03/18  1448     History   Chief Complaint Chief Complaint  Patient presents with  . Abdominal Pain  . Wound Check    HPI [name] is a 62 y.o. female.  HPI   She is here for evaluation of a wound on her abdomen which is draining.  She has a home health nurse check her 3 times a week and on Friday the wound was draining so the nurse suggested that she get it checked out.  Patient decided to come here today for evaluation.  She reports decreased appetite recently.  A friend with her is concerned that she is coughing.  Patient is apparently not eating well chronically has lost "a lot of weight."  She had an illness last week that she calls a virus, and it was manifested by multiple episodes of diarrhea which was nonbloody.  She denies fever, chills, shortness of breath, focal weakness or paresthesia.  She is taking her usual medications.  She continues to smoke cigarettes but has cut down to 3 a day.  She uses oxygen 24/7.  There are no other known modifying factors.  Past Medical History:  Diagnosis Date  . ADD (attention deficit disorder with hyperactivity)   . Anxiety   . Arthritis    "neck" (06/16/2015)  . Asthma   . Basal cell carcinoma, face   . Cervical cancer ([place])   . Chronic bronchitis ([place])   . 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 COPD (chronic obstructive pulmonary disease) ([place])   . Depression   . GERD (gastroesophageal reflux disease)   . Hypertension   . Hypokalemia   . On home oxygen therapy    "2L at bedtime" (06/16/2015)  . PAF (paroxysmal atrial fibrillation) ([place])    04/18/17 in the setting of bronchodilators and intubation for hypercarbic respiratory failure  . Squamous cell carcinoma, face   . Thyroid cyst   . Type II diabetes mellitus [facility])     Patient Active Problem List   Diagnosis Date Noted  . Pneumonia 12/11/2017  . CAP (community  acquired pneumonia) 12/10/2017  . Type 2 diabetes mellitus with vascular disease ([place]) 12/10/2017  . Essential hypertension 12/10/2017  . Chronic left-sided low back pain with left-sided sciatica 09/13/2017  . PAD (peripheral artery disease) ([place]) 05/22/2017  . PAF (paroxysmal atrial fibrillation) ([place]) 05/15/2017  . Altered mental status 04/18/2017  . Acute encephalopathy 04/18/2017  . Respiratory failure ([place])   . 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 Chronic diastolic CHF (congestive heart failure) ([place]) 08/08/2016  . Protein-calorie malnutrition, moderate ([place]) 06/16/2015  . Head injury with loss of consciousness ([place]) 06/04/2015  . Accelerated hypertension 06/04/2015  . Syncope and collapse 06/04/2015  . Pyuria 06/04/2015  . Cigarette smoker 06/04/2015  . Diabetes mellitus without complication ([place]) 80/99/8338  . HLD (hyperlipidemia) 01/03/2014  . COPD exacerbation ([place]) 01/03/2014  . Depression   . Hypertension   . COPD GOLD II/ still smoking    . Asthma   . ADHD (attention deficit hyperactivity disorder)   . Chest pain 12/15/2013  . GERD (gastroesophageal reflux disease)   . Anxiety   . Post op infection 03/24/2011  . Incisional hernia 01/20/2011    Past Surgical History:  Procedure Laterality Date  . ABDOMINAL AORTOGRAM N/A 04/05/2017   Procedure: ABDOMINAL AORTOGRAM;  Surgeon: Conrad Bosworth, MD;  Location: [facility]  OR;  Service: Vascular;  Laterality: N/A;  . ABDOMINAL HYSTERECTOMY    . ANTERIOR CERVICAL DECOMP/DISCECTOMY FUSION    . AORTOGRAM Left 04/05/2017   Procedure: Angiogram with left leg runoff;  Surgeon: Conrad Quitman, MD;  Location: [facility];  Service: Vascular;  Laterality: Left;  . BACK SURGERY    . BASAL CELL CARCINOMA EXCISION     "face"  . COLECTOMY    . EXCISIONAL HEMORRHOIDECTOMY    . FEMORAL-POPLITEAL BYPASS GRAFT Left 05/22/2017   BYPASS GRAFT LEFT COMMON  FEMORAL-ABOVE THE KNEE POPLITEAL ARTERY USING 6  x 40 GORE PROPATEN VASCULAR GRAFT  . FEMORAL-POPLITEAL BYPASS GRAFT Left 05/22/2017   Procedure: BYPASS GRAFT LEFT COMMON  FEMORAL-ABOVE THE KNEE POPLITEAL ARTERY USING 6 x 40 GORE PROPATEN VASCULAR GRAFT;  Surgeon: Conrad Oyens, MD;  Location: [facility];  Service: Vascular;  Laterality: Left;  . HERNIA REPAIR  X 3   incisional/ventral hernia repair "in my stomach where the womb is"  . LAPAROSCOPIC CHOLECYSTECTOMY    . OOPHORECTOMY  2006   cervical cancer  . RECTOVAGINAL FISTULA CLOSURE  2012  . SQUAMOUS CELL CARCINOMA EXCISION    . TUBAL LIGATION    . VULVA SURGERY  2006   Cancer in situ  . WRIST SURGERY Right X 1   "accidentially cut it"  . WRIST SURGERY Left    "don't remember why"     OB History   No obstetric history on file.      Home Medications    Prior to Admission medications   Medication Sig Start Date End Date Taking? Authorizing Provider  albuterol (PROVENTIL HFA;VENTOLIN HFA) 108 (90 Base) MCG/ACT inhaler Inhale 2 puffs into the lungs every 6 (six) hours as needed for wheezing or shortness of breath. 12/13/17  Yes Ghimire, Henreitta Leber, MD  albuterol (PROVENTIL) (2.5 MG/3ML) 0.083% nebulizer solution Take 3 mLs (2.5 mg total) by nebulization every 6 (six) hours as needed for wheezing or shortness of breath. 12/13/17  Yes Ghimire, Henreitta Leber, MD  ALPRAZolam Duanne Moron) 1 MG tablet Take 1 mg by mouth at bedtime.   Yes [provider]  clonazePAM (KLONOPIN) 0.5 MG tablet Take 0.5 mg by mouth every morning.   Yes [provider]  gabapentin (NEURONTIN) 100 MG capsule Take 1 capsule (100 mg total) by mouth 3 (three) times daily. 12/13/17  Yes Ghimire, Henreitta Leber, MD  glipiZIDE (GLUCOTROL) 10 MG tablet Take 10 mg by mouth 2 (two) times daily. 10/12/17  Yes [provider]  hydrochlorothiazide (HYDRODIURIL) 25 MG tablet Take 25 mg by mouth daily.   Yes [provider]  HYDROcodone-acetaminophen (NORCO) 10-325 MG tablet Take 1-2 tablets by mouth every 4  (four) hours as needed for moderate pain. 05/26/17  Yes Ulyses Amor, PA-C  levofloxacin (LEVAQUIN) 750 MG tablet Take 1 tablet (750 mg total) by mouth daily. Patient taking differently: Take 750 mg by mouth daily. For 7 days 12/13/17  Yes Ghimire, Henreitta Leber, MD  metFORMIN (GLUCOPHAGE) 1000 MG tablet Take 1,000 mg by mouth 2 (two) times daily with a meal.   Yes [provider]  OXYGEN Inhale 2 L into the lungs See admin instructions. 2 liters at bedtime and as needed throughout the day, for shortness of breath   Yes [provider]  alprazolam Duanne Moron) 2 MG tablet Take 0.5-1 tablets (1-2 mg total) by mouth See admin instructions. 1 mg by mouth in the morning then 1 mg at noon(time) then 1 mg at 4  PM then 2 mg at bedtime Patient not taking: Reported on 02/03/2018 12/13/17   Jonetta Osgood, MD  feeding supplement, ENSURE ENLIVE, (ENSURE ENLIVE) LIQD Take 237 mLs by mouth 2 (two) times daily between meals. Patient not taking: Reported on 02/03/2018 12/13/17   Jonetta Osgood, MD  Fluticasone-Salmeterol (ADVAIR DISKUS) 250-50 MCG/DOSE AEPB Inhale 1 puff into the lungs 2 (two) times daily. Patient not taking: Reported on 02/03/2018 12/13/17 12/13/18  Jonetta Osgood, MD  guaiFENesin (MUCINEX) 600 MG 12 hr tablet Take 1 tablet (600 mg total) by mouth 2 (two) times daily. Patient not taking: Reported on 02/03/2018 12/13/17   Jonetta Osgood, MD  losartan (COZAAR) 50 MG tablet Take 1 tablet (50 mg total) by mouth daily. Patient not taking: Reported on 02/03/2018 12/14/17   Jonetta Osgood, MD  tiotropium (SPIRIVA HANDIHALER) 18 MCG inhalation capsule Place 1 capsule (18 mcg total) into inhaler and inhale daily. Patient not taking: Reported on 02/03/2018 12/13/17 12/13/18  Jonetta Osgood, MD  clomiPRAMINE (ANAFRANIL) 25 MG capsule Take 25 mg by mouth at bedtime.   05/08/11  [provider]    Family History Family History  Problem Relation Age of Onset  .  COPD Father   . Cancer Father        lung  . COPD Mother   . Asthma Mother   . Cancer Paternal Aunt        lung    Social History Social History   Tobacco Use  . Smoking status: Current Every Day Smoker    Packs/day: 1.00    Years: 47.00    Pack years: 47.00    Types: Cigarettes  . Smokeless tobacco: Never Used  Substance Use Topics  . Alcohol use: No  . Drug use: No     Allergies   Contrast media [iodinated diagnostic agents]; Doxycycline; Erythromycin; Penicillins; Amoxicillin; and Metronidazole   Review of Systems Review of Systems  All other systems reviewed and are negative.    Physical Exam Updated Vital Signs BP 121/75   Pulse 63   Temp 99.2 F (37.3 C) (Rectal)   Resp 18   Ht 5\' 5"  (1.651 m)   Wt 45.4 kg   SpO2 100%   BMI 16.64 kg/m   Physical Exam Vitals signs and nursing note reviewed.  Constitutional:      General: She is not in acute distress.    Appearance: She is well-developed. She is ill-appearing (Chronically ill-appearing). She is not toxic-appearing or diaphoretic.     Comments: Frail appears much older than stated age.  HENT:     Head: Normocephalic and atraumatic.  Eyes:     Conjunctiva/sclera: Conjunctivae normal.     Pupils: Pupils are equal, round, and reactive to light.  Neck:     Musculoskeletal: Normal range of motion and neck supple.     Trachea: Phonation normal.  Cardiovascular:     Rate and Rhythm: Normal rate and regular rhythm.  Pulmonary:     Effort: Pulmonary effort is normal. No respiratory distress.     Breath sounds: No stridor.     Comments: Somewhat decreased air movement bilaterally without wheezes or rales.  Few scattered rhonchi.  There is no increased work of breathing. Chest:     Chest wall: No tenderness.  Abdominal:     General: There is no distension.     Palpations: Abdomen is soft. There is no mass.     Tenderness: There is no abdominal tenderness.  There is no guarding.     Hernia: No hernia is  present.     Comments: Midline suprapubic surgical wound, with approximately 1 cm of superficial dehiscence, wound currently closed not bleeding, draining or having associated fluctuance.  No deformity of the abdomen.  Musculoskeletal: Normal range of motion.  Skin:    General: Skin is warm and dry.     Comments: Excoriations nose nonspecific appearance without drainage or bleeding.  Neurological:     Mental Status: She is alert and oriented to person, place, and time.     Motor: No abnormal muscle tone.  Psychiatric:        Behavior: Behavior normal.        Thought Content: Thought content normal.        Judgment: Judgment normal.      ED Treatments / Results  Labs (all labs ordered are listed, but only abnormal results are displayed) Labs Reviewed  COMPREHENSIVE METABOLIC PANEL - Abnormal; Notable for the following components:      Result Value   Potassium 3.0 (*)    Glucose, Bld 108 (*)    BUN 27 (*)    Albumin 3.2 (*)    All other components within normal limits  URINALYSIS, ROUTINE W REFLEX MICROSCOPIC - Abnormal; Notable for the following components:   Specific Gravity, Urine <1.005 (*)    Bilirubin Urine SMALL (*)    All other components within normal limits  I-STAT VENOUS BLOOD GAS, ED - Abnormal; Notable for the following components:   pO2, Ven 17.0 (*)    Bicarbonate 29.3 (*)    Acid-Base Excess 3.0 (*)    All other components within normal limits  CBC WITH DIFFERENTIAL/PLATELET  BLOOD GAS, VENOUS    EKG None  Radiology Dg Chest 2 View  Result Date: 02/03/2018 CLINICAL DATA:  Abdominal pain and swelling. EXAM: CHEST - 2 VIEW COMPARISON:  12/10/2017 FINDINGS: Heart size is normal. There has been near complete resolution of the previously seen right base pneumonia. Mild persistent patchy density in the right middle lobe. No worsening or new finding. No effusion. No significant bony abnormality. Background pulmonary emphysema. IMPRESSION: Resolution of  previously seen right lower lobe pneumonia. Mild persistent patchy density in the right middle lobe. No worsening or new finding. Electronically Signed   By: Nelson Chimes M.D.   On: 02/03/2018 16:24   Ct Abdomen Pelvis W Contrast  Result Date: 02/03/2018 CLINICAL DATA:  Abdominal distention and diarrhea. EXAM: CT ABDOMEN AND PELVIS WITH CONTRAST TECHNIQUE: Multidetector CT imaging of the abdomen and pelvis was performed using the standard protocol following bolus administration of intravenous contrast. CONTRAST:  125mL OMNIPAQUE IOHEXOL 300 MG/ML  SOLN COMPARISON:  09/20/2012. FINDINGS: Lower chest: Patchy airspace disease posterior right lower lobe is suspicious for pneumonia. Hepatobiliary: 14 mm low-density lesion in the lateral segment left liver is stable and compatible with a cyst. Gallbladder surgically absent. Mild intrahepatic biliary duct dilatation is more prominent than previously. Extrahepatic common duct measures 8 mm diameter. The common bile duct in the head of the pancreas is 7 mm diameter. Pancreas: No focal mass lesion. Main duct diameter upper normal at 3 mm. No intraparenchymal cyst. No peripancreatic edema. Spleen: No splenomegaly. No focal mass lesion. Adrenals/Urinary Tract: Stable thickening of both adrenal glands, likely adenomatous hyperplasia. Kidneys unremarkable. No evidence for hydroureter. The urinary bladder appears normal for the degree of distention. Stomach/Bowel: Stomach is nondistended. No gastric wall thickening. No evidence of outlet obstruction. Duodenum is normally positioned  as is the ligament of Treitz. No small bowel wall thickening. No small bowel dilatation. The terminal ileum is normal. The appendix is normal. Normal appendix seen arising from the tip of the cecum on coronal image 64/series 6. Colon Follows a markedly tortuous course but is otherwise unremarkable. Vascular/Lymphatic: There is abdominal aortic atherosclerosis without aneurysm. There is no  gastrohepatic or hepatoduodenal ligament lymphadenopathy. No intraperitoneal or retroperitoneal lymphadenopathy. No pelvic sidewall lymphadenopathy. Reproductive: Uterus surgically absent.  There is no adnexal mass. Other: No intraperitoneal free fluid. Musculoskeletal: 5.3 x 2.5 x 4.8 cm complex fluid collection is identified in the lower anterior abdominal wall, approximately 10 cm caudal to the umbilicus, 5 cm cranial to the pubic symphysis and just to the right of midline. This collection shows intense circumferential rim enhancement. No evidence for gas within the collection. A right paraumbilical ventral hernia contains a segment of transverse colon. No colonic wall thickening or fluid in the hernia sac to suggest complication. No findings to suggest obstruction related to the hernia. No worrisome lytic or sclerotic osseous abnormality. IMPRESSION: 1. 5.3 x 2.5 x 4.8 cm complex fluid collection in the lower anterior abdominal wall, approximately 10 cm caudal to the umbilicus, 5 cm cranial to the pubic symphysis and just to the right of midline. Imaging features are most suggestive of abscess. Old hematoma with enhancing rim of granulation would also be a consideration. Soft tissue mass felt to be much less likely. 2. Right paraumbilical ventral hernia contains colon without complicating features. 3. Patchy airspace disease posterior right lower lobe, suspicious for pneumonia. 4. Mild intra and extrahepatic biliary duct dilatation. This may be related to prior cholecystectomy, but correlation with liver function test may prove helpful. 5.  Aortic Atherosclerois (ICD10-170.0) Electronically Signed   By: Misty Stanley M.D.   On: 02/03/2018 18:38    Procedures Procedures (including critical care time)  Medications Ordered in ED Medications  0.9 %  sodium chloride infusion (125 mL/hr Intravenous Rate/Dose Change 02/03/18 1747)  sodium chloride 0.9 % bolus 500 mL (0 mLs Intravenous Stopped 02/03/18 1747)    potassium chloride SA (K-DUR,KLOR-CON) CR tablet 40 mEq (40 mEq Oral Given 02/03/18 1833)  acetaminophen (TYLENOL) tablet 650 mg (650 mg Oral Given 02/03/18 1833)  iohexol (OMNIPAQUE) 300 MG/ML solution 100 mL (100 mLs Intravenous Contrast Given 02/03/18 1745)     Initial Impression / Assessment and Plan / ED Course  I have reviewed the triage vital signs and the nursing notes.  Pertinent labs & imaging results that were available during my care of the patient were reviewed by me and considered in my medical decision making (see chart for details).  Clinical Course as of Feb 03 1957  Sun Feb 03, 2018  1720 Normal except potassium low, glucose high, BUN high, albumin low  Comprehensive metabolic panel(!) [EW]  4332 Normal  CBC with Differential [EW]  1721 Normal except bicarb elevated consistent with chronic respiratory disease.  I-Stat venous blood gas, ED(!!) [EW]  9518 Consistent with resolving right middle lobe pneumonia, images reviewed by me  DG Chest 2 View [EW]  1915 Complex fluid collection right lower anterior abdominal wall possibly consistent with abscess.  Small ventral hernia periumbilical space.  Patchy pneumonia present in lower lobe, right.  CT Abdomen Pelvis W Contrast [EW]  1955 Normal except small amount of bilirubin present  Urinalysis, Routine w reflex microscopic(!) [EW]    Clinical Course User Index [EW] Daleen Bo, MD     Patient Vitals for the  past 24 hrs:  BP Temp Temp src Pulse Resp SpO2 Height Weight  02/03/18 1900 121/75 - - 63 - 100 % - -  02/03/18 1845 132/70 - - 64 - 99 % - -  02/03/18 1833 137/78 - - 65 18 98 % - -  02/03/18 1730 120/69 - - 62 18 98 % - -  02/03/18 1715 119/68 - - 62 - 98 % - -  02/03/18 1700 135/72 - - 62 - 97 % - -  02/03/18 1645 127/72 - - 62 - 98 % - -  02/03/18 1630 125/80 - - 71 - 97 % - -  02/03/18 1615 131/74 - - 69 - 96 % - -  02/03/18 1600 (!) 145/84 - - 71 - 95 % - -  02/03/18 1546 (!) 144/83 - - 70 18 95 %  - -  02/03/18 1518 - 99.2 F (37.3 C) Rectal - - - - -  02/03/18 1515 (!) 165/75 - - 75 - 97 % - -  02/03/18 1500 (!) 142/94 - - 75 - 95 % 5\' 5"  (1.651 m) 45.4 kg  02/03/18 1454 (!) 157/75 98.5 F (36.9 C) Oral 78 16 96 % - -    7:13 PM Reevaluation with update and discussion. After initial assessment and treatment, an updated evaluation reveals patient is comfortable.  Urine sample obtained by catheterization at this time.  Repeat abdominal exam: Continues to be soft and essentially nontender to palpation.  Right periumbilical hernia is partially reducible and nontender with palpation.  I am able to palpate the small mass in the suprapubic region seen on CT imaging possibly consistent with abscess.  This mass is nontender.  The mass is fairly mobile, and not easily amenable to an aspiration procedure or superficial enough for abscess drainage in the emergency department.  This mass is also nontender to palpation.  These findings were discussed with the patient.  We will assess the urinalysis and arrange for disposition.  Urinalysis negative, findings discussed and questions answered. Daleen Bo   Medical Decision Making: Patient presenting for wound drainage, lower mid abdomen which is apparently chronic by her report.  There is presence of an associated mass on CT imaging, raising the possibility for seroma versus chronic low-grade infection, leading to drainage.  This does not appear to be a fistula from an intra-abdominal source.  The patient is nontoxic without evidence for acute infectious process by vitals or blood work.  She does not have signs or symptoms of pneumonia.  She is a chronic smoker, with smoker's cough.  Doubt acute pneumonia.  CRITICAL CARE- No Performed by: Daleen Bo   Nursing Notes Reviewed/ Care Coordinated Applicable Imaging Reviewed Interpretation of Laboratory Data incorporated into ED treatment  The patient appears reasonably screened and/or stabilized for  discharge and I doubt any other medical condition or other [facility] requiring further screening, evaluation, or treatment in the ED at this time prior to discharge.  Plan: Home Medications- usual; Home Treatments- rest; return here if the recommended treatment, does not improve the symptoms; Recommended follow up- General Surgery 1 week for evaluation    Final Clinical Impressions(s) / ED Diagnoses   Final diagnoses:  Encounter for wound re-check    ED Discharge Orders    None       Daleen Bo, MD 02/05/18 [phone]

## 2018-02-03 NOTE — Discharge Instructions (Addendum)
The wound in your abdomen is likely related to a small mass just to the right of it, that was found on the CT images.  This is likely a seroma that has been present for some time.  You need to see a surgeon to see about getting it assessed further and possibly removed.  In the meantime you can use some heat on the area to help the wound heal and improve the discomfort.  You have hernia around the umbilicus, that does not appear to be causing problems, at this time.  If you develop a fever, vomiting or dizziness you need to return here.

## 2018-02-03 NOTE — ED Notes (Signed)
Patient transported to CT 

## 2018-02-03 NOTE — ED Notes (Signed)
Pt refuses cth for urine specimen. Will inform provider.

## 2018-02-03 NOTE — ED Triage Notes (Signed)
Pt Arrives from home with c/o abdominal pain and wound at abdomin that she was instructed by [facility] nurse to be seen on Friday for swelling . Pt states wpound from surgery "years "ago.

## 2018-04-22 ENCOUNTER — Encounter (HOSPITAL_BASED_OUTPATIENT_CLINIC_OR_DEPARTMENT_OTHER): Payer: Medicare Other | Attending: Internal Medicine

## 2018-04-22 DIAGNOSIS — Z85828 Personal history of other malignant neoplasm of skin: Secondary | ICD-10-CM | POA: Insufficient documentation

## 2018-04-22 DIAGNOSIS — E11622 Type 2 diabetes mellitus with other skin ulcer: Secondary | ICD-10-CM | POA: Insufficient documentation

## 2018-04-22 DIAGNOSIS — I10 Essential (primary) hypertension: Secondary | ICD-10-CM | POA: Insufficient documentation

## 2018-04-22 DIAGNOSIS — Z8541 Personal history of malignant neoplasm of cervix uteri: Secondary | ICD-10-CM | POA: Insufficient documentation

## 2018-04-22 DIAGNOSIS — J449 Chronic obstructive pulmonary disease, unspecified: Secondary | ICD-10-CM | POA: Insufficient documentation

## 2018-04-22 DIAGNOSIS — L98492 Non-pressure chronic ulcer of skin of other sites with fat layer exposed: Secondary | ICD-10-CM | POA: Insufficient documentation

## 2018-05-01 ENCOUNTER — Other Ambulatory Visit (HOSPITAL_COMMUNITY)
Admission: RE | Admit: 2018-05-01 | Discharge: 2018-05-01 | Disposition: A | Discharge status: Home / Self Care | Payer: Medicare Other | Source: Other Acute Inpatient Hospital | Attending: Internal Medicine | Admitting: Internal Medicine

## 2018-05-01 DIAGNOSIS — Z85828 Personal history of other malignant neoplasm of skin: Secondary | ICD-10-CM | POA: Diagnosis not present

## 2018-05-01 DIAGNOSIS — L98492 Non-pressure chronic ulcer of skin of other sites with fat layer exposed: Secondary | ICD-10-CM | POA: Diagnosis present

## 2018-05-01 DIAGNOSIS — E11622 Type 2 diabetes mellitus with other skin ulcer: Secondary | ICD-10-CM | POA: Diagnosis not present

## 2018-05-01 DIAGNOSIS — J449 Chronic obstructive pulmonary disease, unspecified: Secondary | ICD-10-CM | POA: Diagnosis not present

## 2018-05-01 DIAGNOSIS — Z8541 Personal history of malignant neoplasm of cervix uteri: Secondary | ICD-10-CM | POA: Diagnosis not present

## 2018-05-01 DIAGNOSIS — Z006 Encounter for examination for normal comparison and control in clinical research program: Secondary | ICD-10-CM | POA: Insufficient documentation

## 2018-05-01 DIAGNOSIS — I10 Essential (primary) hypertension: Secondary | ICD-10-CM | POA: Diagnosis not present

## 2018-05-05 LAB — AEROBIC CULTURE W GRAM STAIN (SUPERFICIAL SPECIMEN)

## 2018-05-05 LAB — AEROBIC CULTURE  (SUPERFICIAL SPECIMEN)

## 2018-05-22 ENCOUNTER — Encounter (HOSPITAL_BASED_OUTPATIENT_CLINIC_OR_DEPARTMENT_OTHER): Payer: Medicare Other | Attending: Physician Assistant

## 2018-06-17 ENCOUNTER — Encounter (HOSPITAL_BASED_OUTPATIENT_CLINIC_OR_DEPARTMENT_OTHER): Payer: Medicare Other | Attending: Internal Medicine

## 2018-06-17 DIAGNOSIS — J449 Chronic obstructive pulmonary disease, unspecified: Secondary | ICD-10-CM | POA: Insufficient documentation

## 2018-06-17 DIAGNOSIS — I10 Essential (primary) hypertension: Secondary | ICD-10-CM | POA: Insufficient documentation

## 2018-06-17 DIAGNOSIS — L98492 Non-pressure chronic ulcer of skin of other sites with fat layer exposed: Secondary | ICD-10-CM | POA: Insufficient documentation

## 2018-06-17 DIAGNOSIS — E11622 Type 2 diabetes mellitus with other skin ulcer: Secondary | ICD-10-CM | POA: Insufficient documentation

## 2018-06-25 DIAGNOSIS — I10 Essential (primary) hypertension: Secondary | ICD-10-CM | POA: Diagnosis not present

## 2018-06-25 DIAGNOSIS — J449 Chronic obstructive pulmonary disease, unspecified: Secondary | ICD-10-CM | POA: Diagnosis not present

## 2018-06-25 DIAGNOSIS — E11622 Type 2 diabetes mellitus with other skin ulcer: Secondary | ICD-10-CM | POA: Diagnosis present

## 2018-06-25 DIAGNOSIS — L98492 Non-pressure chronic ulcer of skin of other sites with fat layer exposed: Secondary | ICD-10-CM | POA: Diagnosis not present

## 2018-07-26 ENCOUNTER — Encounter (HOSPITAL_BASED_OUTPATIENT_CLINIC_OR_DEPARTMENT_OTHER): Payer: Medicare Other | Attending: Internal Medicine

## 2018-07-26 ENCOUNTER — Other Ambulatory Visit (HOSPITAL_COMMUNITY)
Admission: RE | Admit: 2018-07-26 | Discharge: 2018-07-26 | Disposition: A | Discharge status: Home / Self Care | Payer: Medicare Other | Source: Other Acute Inpatient Hospital | Attending: Internal Medicine | Admitting: Internal Medicine

## 2018-07-26 DIAGNOSIS — L02211 Cutaneous abscess of abdominal wall: Secondary | ICD-10-CM | POA: Diagnosis not present

## 2018-07-26 DIAGNOSIS — E11622 Type 2 diabetes mellitus with other skin ulcer: Secondary | ICD-10-CM | POA: Insufficient documentation

## 2018-07-26 DIAGNOSIS — Z85828 Personal history of other malignant neoplasm of skin: Secondary | ICD-10-CM | POA: Diagnosis not present

## 2018-07-26 DIAGNOSIS — L989 Disorder of the skin and subcutaneous tissue, unspecified: Secondary | ICD-10-CM | POA: Insufficient documentation

## 2018-07-26 DIAGNOSIS — J449 Chronic obstructive pulmonary disease, unspecified: Secondary | ICD-10-CM | POA: Insufficient documentation

## 2018-07-26 DIAGNOSIS — I10 Essential (primary) hypertension: Secondary | ICD-10-CM | POA: Diagnosis not present

## 2018-07-26 DIAGNOSIS — Z8541 Personal history of malignant neoplasm of cervix uteri: Secondary | ICD-10-CM | POA: Insufficient documentation

## 2018-07-26 DIAGNOSIS — L98492 Non-pressure chronic ulcer of skin of other sites with fat layer exposed: Secondary | ICD-10-CM | POA: Insufficient documentation

## 2018-08-12 LAB — AEROBIC CULTURE W GRAM STAIN (SUPERFICIAL SPECIMEN)

## 2018-08-12 LAB — AEROBIC CULTURE? (SUPERFICIAL SPECIMEN)

## 2018-08-13 LAB — SUSCEPTIBILITY, AER + ANAEROB

## 2018-08-13 LAB — SUSCEPTIBILITY RESULT

## 2018-08-23 ENCOUNTER — Encounter (HOSPITAL_BASED_OUTPATIENT_CLINIC_OR_DEPARTMENT_OTHER): Payer: Medicare Other

## 2018-10-30 IMAGING — DX DG CHEST 1V PORT
1 series · 1 of 1 positions shown · non-contrast
Comparison: Yesterday

CLINICAL DATA: Respiratory failure.

EXAM:
PORTABLE CHEST 1 VIEW

[chest ap]
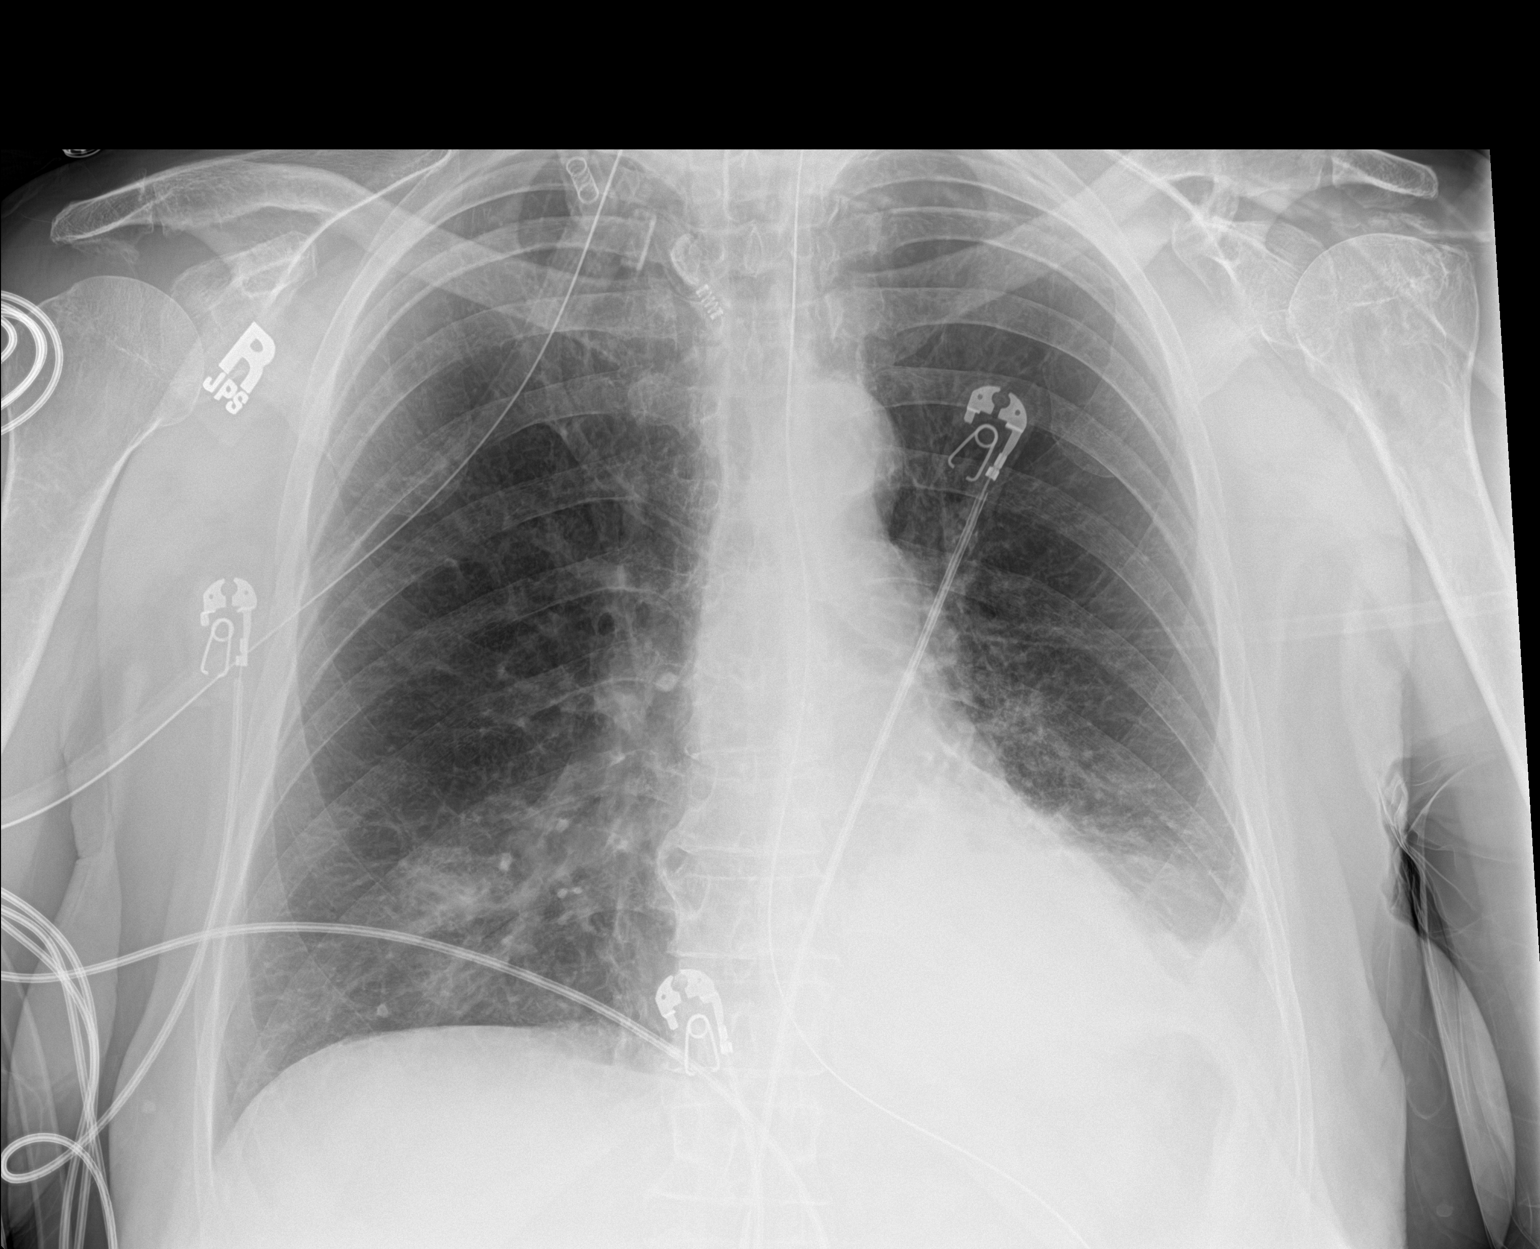

[1 of 1 positions shown; findings below may reference images not displayed]

FINDINGS: Endotracheal tube tip is 3 cm above the clavicular heads. This is
stable from yesterday. An orogastric tube reaches the stomach.

New dense opacity at the left base at least partially from pleural
fluid. There is also increased patchy density at the right base. No
Kerley lines. No pneumothorax. Normal heart size. Large lung
volumes. There is history of COPD.

These results will be called to the ordering clinician or
representative by the Radiologist Assistant, and communication
documented in the PACS or zVision Dashboard.
IMPRESSION: 1. Unchanged high endotracheal tube, tip 3 cm above the clavicular
heads.
2. Increased opacity at the bases which could be infection,
aspiration, or atelectasis. New small left pleural effusion.
